# Patient Record
Sex: Female | Born: 1965
Health system: Southern US, Community
[De-identification: ages and names within clinical notes are randomized; demographics above are authoritative.]

## PROBLEM LIST (undated history)

## (undated) DIAGNOSIS — R7303 Prediabetes: Secondary | ICD-10-CM

## (undated) DIAGNOSIS — D649 Anemia, unspecified: Secondary | ICD-10-CM

## (undated) DIAGNOSIS — R011 Cardiac murmur, unspecified: Secondary | ICD-10-CM

## (undated) DIAGNOSIS — F32A Depression, unspecified: Secondary | ICD-10-CM

## (undated) DIAGNOSIS — C801 Malignant (primary) neoplasm, unspecified: Secondary | ICD-10-CM

## (undated) DIAGNOSIS — Z9889 Other specified postprocedural states: Secondary | ICD-10-CM

## (undated) DIAGNOSIS — Z8489 Family history of other specified conditions: Secondary | ICD-10-CM

## (undated) DIAGNOSIS — F329 Major depressive disorder, single episode, unspecified: Secondary | ICD-10-CM

## (undated) DIAGNOSIS — F419 Anxiety disorder, unspecified: Secondary | ICD-10-CM

## (undated) DIAGNOSIS — M1990 Unspecified osteoarthritis, unspecified site: Secondary | ICD-10-CM

## (undated) HISTORY — PX: ABDOMINAL HYSTERECTOMY: SHX81

## (undated) HISTORY — PX: TONSILLECTOMY: SUR1361

---

## 1999-04-23 ENCOUNTER — Other Ambulatory Visit: Admission: RE | Admit: 1999-04-23 | Discharge: 1999-04-23 | Payer: Self-pay | Admitting: *Deleted

## 1999-11-16 ENCOUNTER — Other Ambulatory Visit: Admission: RE | Admit: 1999-11-16 | Discharge: 1999-11-16 | Payer: Self-pay | Admitting: Family Medicine

## 2000-05-20 ENCOUNTER — Other Ambulatory Visit: Admission: RE | Admit: 2000-05-20 | Discharge: 2000-05-20 | Payer: Self-pay | Admitting: Internal Medicine

## 2001-08-11 ENCOUNTER — Other Ambulatory Visit: Admission: RE | Admit: 2001-08-11 | Discharge: 2001-08-11 | Payer: Self-pay | Admitting: *Deleted

## 2001-08-15 ENCOUNTER — Ambulatory Visit (HOSPITAL_COMMUNITY): Admission: RE | Admit: 2001-08-15 | Discharge: 2001-08-15 | Payer: Self-pay | Admitting: Family Medicine

## 2001-08-15 ENCOUNTER — Encounter: Payer: Self-pay | Admitting: Family Medicine

## 2003-01-03 ENCOUNTER — Other Ambulatory Visit: Admission: RE | Admit: 2003-01-03 | Discharge: 2003-01-03 | Payer: Self-pay | Admitting: Family Medicine

## 2004-02-27 ENCOUNTER — Other Ambulatory Visit: Admission: RE | Admit: 2004-02-27 | Discharge: 2004-02-27 | Payer: Self-pay | Admitting: Family Medicine

## 2005-06-22 ENCOUNTER — Other Ambulatory Visit: Admission: RE | Admit: 2005-06-22 | Discharge: 2005-06-22 | Payer: Self-pay | Admitting: Family Medicine

## 2006-09-16 ENCOUNTER — Other Ambulatory Visit: Admission: RE | Admit: 2006-09-16 | Discharge: 2006-09-16 | Payer: Self-pay | Admitting: Family Medicine

## 2008-01-15 ENCOUNTER — Other Ambulatory Visit: Admission: RE | Admit: 2008-01-15 | Discharge: 2008-01-15 | Payer: Self-pay | Admitting: Family Medicine

## 2009-05-19 ENCOUNTER — Other Ambulatory Visit: Admission: RE | Admit: 2009-05-19 | Discharge: 2009-05-19 | Payer: Self-pay | Admitting: Family Medicine

## 2009-07-01 ENCOUNTER — Ambulatory Visit: Payer: Self-pay | Admitting: Gastroenterology

## 2009-07-01 DIAGNOSIS — D649 Anemia, unspecified: Secondary | ICD-10-CM | POA: Insufficient documentation

## 2009-07-02 LAB — CONVERTED CEMR LAB: IgA: 290 mg/dL (ref 68–378)

## 2009-07-08 ENCOUNTER — Encounter (INDEPENDENT_AMBULATORY_CARE_PROVIDER_SITE_OTHER): Payer: Self-pay | Admitting: *Deleted

## 2009-07-08 LAB — CONVERTED CEMR LAB: Tissue Transglutaminase Ab, IgA: 6.4 units (ref <20)

## 2009-07-24 ENCOUNTER — Encounter (INDEPENDENT_AMBULATORY_CARE_PROVIDER_SITE_OTHER): Payer: Self-pay | Admitting: *Deleted

## 2009-07-28 ENCOUNTER — Ambulatory Visit: Payer: Self-pay | Admitting: Gastroenterology

## 2009-08-06 ENCOUNTER — Ambulatory Visit: Payer: Self-pay | Admitting: Gastroenterology

## 2009-10-10 ENCOUNTER — Telehealth (INDEPENDENT_AMBULATORY_CARE_PROVIDER_SITE_OTHER): Payer: Self-pay | Admitting: *Deleted

## 2009-11-11 ENCOUNTER — Emergency Department (HOSPITAL_COMMUNITY)
Admission: EM | Admit: 2009-11-11 | Discharge: 2009-11-11 | Payer: Self-pay | Source: Home / Self Care | Admitting: Family Medicine

## 2010-01-25 ENCOUNTER — Encounter: Payer: Self-pay | Admitting: Physician Assistant

## 2010-02-05 NOTE — Procedures (Signed)
Summary: Colonoscopy  Patient: Patricia Houston Note: All result statuses are Final unless otherwise noted.  Tests: (1) Colonoscopy (COL)   COL Colonoscopy           DONE     [facility]     [address]     [place], [place]  [address]           COLONOSCOPY PROCEDURE REPORT           PATIENT:  Patricia Houston, Patricia Houston  MR#:  [id number]     BIRTHDATE:  09-13-1965, 43 yrs. old  GENDER:  female     ENDOSCOPIST:  Rachael Fee, MD     REF. BY:  Merri Brunette, M.D.     PROCEDURE DATE:  08/06/2009     PROCEDURE:  Diagnostic Colonoscopy     ASA CLASS:  Class II     INDICATIONS:  iron def anemia     MEDICATIONS:   Fentanyl 75 mcg IV, Versed 6 mg IV           DESCRIPTION OF PROCEDURE:   After the risks benefits and     alternatives of the procedure were thoroughly explained, informed     consent was obtained.  Digital rectal exam was performed and     revealed no rectal masses.   The [id number] endoscope was     introduced through the anus and advanced to the terminal ileum     which was intubated for a short distance, without limitations.     The quality of the prep was excellent, using MoviPrep.  The     instrument was then slowly withdrawn as the colon was fully     examined.     <<PROCEDUREIMAGES>>           FINDINGS:  The terminal ileum appeared normal (see image2).  A     normal appearing cecum, ileocecal valve, and appendiceal orifice     were identified. The ascending, hepatic flexure, transverse,     splenic flexure, descending, sigmoid colon, and rectum appeared     unremarkable (see image1, image3, and image4).   Retroflexed views     in the rectum revealed no abnormalities.    The scope was then     withdrawn from the patient and the procedure completed.           COMPLICATIONS:  None     ENDOSCOPIC IMPRESSION:     1) Normal terminal ileum     2) Normal colon; no polyps or cancers     3) No clear cause of IDA           RECOMMENDATIONS:     Will proceed  with EGD now.           ______________________________     Rachael Fee, MD           n.     eSIGNED:   Rachael Fee at 08/06/2009 01:43 PM           [name], [id number]  Note: An exclamation mark (!) indicates a result that was not dispersed into the flowsheet. Document Creation Date: 08/06/2009 1:44 PM _______________________________________________________________________  (1) Order result status: Final Collection or observation date-time: 08/06/2009 13:41 Requested date-time:  Receipt date-time:  Reported date-time:  Referring Physician:   Ordering Physician: Rob Bunting [phone]) Specimen Source:  Source: Launa Grill Order Number: [id number] Lab site:

## 2010-02-05 NOTE — Letter (Signed)
Summary: Previsit letter  [facility]  [address] [place], [place] [address]   Phone: [phone]  Fax: [phone]       07/08/2009 MRN: [id number]  Patricia Houston [address] [place], [place]  [address]  Dear [name],  Welcome to the Gastroenterology Division at [facility].    You are scheduled to see a nurse for your pre-procedure visit on 07/28/09 at 3:30 pm on the 3rd floor at [facility], [address]  We ask that you try to arrive at our office 15 minutes prior to your appointment time to allow for check-in.  Your nurse visit will consist of discussing your medical and surgical history, your immediate family medical history, and your medications.    Please bring a complete list of all your medications or, if you prefer, bring the medication bottles and we will list them.  We will need to be aware of both prescribed and over the counter drugs.  We will need to know exact dosage information as well.  If you are on blood thinners (Coumadin, Plavix, Aggrenox, Ticlid, etc.) please call our office today/prior to your appointment, as we need to consult with your physician about holding your medication.   Please be prepared to read and sign documents such as consent forms, a financial agreement, and acknowledgement forms.  If necessary, and with your consent, a friend or relative is welcome to sit-in on the nurse visit with you.  Please bring your insurance card so that we may make a copy of it.  If your insurance requires a referral to see a specialist, please bring your referral form from your primary care physician.  No co-pay is required for this nurse visit.     If you cannot keep your appointment, please call [phone] to cancel or reschedule prior to your appointment date.  This allows [place] the opportunity to schedule an appointment for another patient in need of care.    Thank you for choosing [facility] for your medical  needs.  We appreciate the opportunity to care for you.  Please visit [place] at our website  to learn more about our practice.                     Sincerely.                                                                                                                   The Gastroenterology Division

## 2010-02-05 NOTE — Procedures (Signed)
Summary: Upper Endoscopy  Patient: [name] Note: All result statuses are Final unless otherwise noted.  Tests: (1) Upper Endoscopy (EGD)   EGD Upper Endoscopy       DONE     [facility]     [address]     [place], [place]  [address]           ENDOSCOPY PROCEDURE REPORT           PATIENT:  Patricia Houston, Patricia Houston  MR#:  [id number]     BIRTHDATE:  Apr 11, 1965, 43 yrs. old  GENDER:  female     ENDOSCOPIST:  Rachael Fee, MD     PROCEDURE DATE:  08/06/2009     PROCEDURE:  EGD with biopsy     ASA CLASS:  Class II     INDICATIONS:  iron def anemia           MEDICATIONS:  There was residual sedation effect present from     prior procedure., Versed 2 mg IV     TOPICAL ANESTHETIC:  none           DESCRIPTION OF PROCEDURE:   After the risks benefits and     alternatives of the procedure were thoroughly explained, informed     consent was obtained.  The LB GIF-H180 [id number] endoscope was     introduced through the mouth and advanced to the second portion of     the duodenum, without limitations.  The instrument was slowly     withdrawn as the mucosa was fully examined.     <<PROCEDUREIMAGES>>           There was mild to moderate pan-gastritis. Distally in stomach     there were 3 linear, spoke-like erosions. Biopsies were taken to     check for H. pylori (see image5 and image6).  Otherwise the     examination was normal (see image7, image4, image3, and image1).     Retroflexed views revealed no abnormalities.    The scope was then     withdrawn from the patient and the procedure completed.           COMPLICATIONS:  None           ENDOSCOPIC IMPRESSION:     1) Mild gastritis; biopsies taken to check for H. pylori     2) Otherwise normal examination           RECOMMENDATIONS:     If biopsies show H. pylori, then she will be started on the     appropriate antibiotics.  If not, then I will have her complete 2     sets of FOBT cards and if there is occult blood in  her stool will     likely set her up with capsule endoscopy to examine small bowel.           ______________________________     Rachael Fee, MD           cc: Merri Brunette, MD           n.     Rosalie Doctor:   Rachael Fee at 08/06/2009 01:53 PM           [name], [id number]  Note: An exclamation mark (!) indicates a result that was not dispersed into the flowsheet. Document Creation Date: 08/06/2009 1:54 PM _______________________________________________________________________  (1) Order result status: Final Collection or observation date-time: 08/06/2009 13:49 Requested date-time:  Receipt date-time:  Reported  date-time:  Referring Physician:   Ordering Physician: Rob Bunting [phone]) Specimen Source:  Source: Launa Grill Order Number: [id number] Lab site:

## 2010-02-05 NOTE — Letter (Signed)
Summary: Moviprep Instructions  [facility]  [address]   [place], [place] [address]   Phone: [phone]  Fax: [phone]       Patricia Houston    Oct 15, 1965    MRN: [id number]        Procedure Day Dorna Bloom: Wednesday, 08-06-09     Arrival Time: 12:30 p.m.     Procedure Time: 1:30 p.m.     Location of Procedure:                    x   [facility] (4th Floor)                        PREPARATION FOR COLONOSCOPY WITH MOVIPREP   Starting 5 days prior to your procedure  08-01-09 do not eat nuts, seeds, popcorn, corn, beans, peas,  salads, or any raw vegetables.  Do not take any fiber supplements (e.g. Metamucil, Citrucel, and Benefiber).  THE DAY BEFORE YOUR PROCEDURE         DATE:  08-05-09  DAY: Tuesday  1.  Drink clear liquids the entire day-NO SOLID FOOD  2.  Do not drink anything colored red or purple.  Avoid juices with pulp.  No orange juice.  3.  Drink at least 64 oz. (8 glasses) of fluid/clear liquids during the day to prevent dehydration and help the prep work efficiently.  CLEAR LIQUIDS INCLUDE: Water Jello Ice Popsicles Tea (sugar ok, no milk/cream) Powdered fruit flavored drinks Coffee (sugar ok, no milk/cream) Gatorade Juice: apple, white grape, white cranberry  Lemonade Clear bullion, consomm, broth Carbonated beverages (any kind) Strained chicken noodle soup Hard Candy                             4.  In the morning, mix first dose of MoviPrep solution:    Empty 1 Pouch A and 1 Pouch B into the disposable container    Add lukewarm drinking water to the top line of the container. Mix to dissolve    Refrigerate (mixed solution should be used within 24 hrs)  5.  Begin drinking the prep at 5:00 p.m. The MoviPrep container is divided by 4 marks.   Every 15 minutes drink the solution down to the next mark (approximately 8 oz) until the full liter is complete.   6.  Follow completed prep with 16 oz of clear liquid of your  choice (Nothing red or purple).  Continue to drink clear liquids until bedtime.  7.  Before going to bed, mix second dose of MoviPrep solution:    Empty 1 Pouch A and 1 Pouch B into the disposable container    Add lukewarm drinking water to the top line of the container. Mix to dissolve    Refrigerate  THE DAY OF YOUR PROCEDURE      DATE: 08-06-09  DAY: Wednesday  Beginning at  8:30 a.m. (5 hours before procedure):         1. Every 15 minutes, drink the solution down to the next mark (approx 8 oz) until the full liter is complete.  2. Follow completed prep with 16 oz. of clear liquid of your choice.    3. You may drink clear liquids until 11:30 a.m. (2 HOURS BEFORE PROCEDURE).   MEDICATION INSTRUCTIONS  Unless otherwise instructed, you should take regular prescription medications with a small sip of water   as early as  possible the morning of your procedure.   Additional medication instructions: Stop Iron 5 days before procedure.         OTHER INSTRUCTIONS  You will need a responsible adult at least 43 years of age to accompany you and drive you home.   This person must remain in the waiting room during your procedure.  Wear loose fitting clothing that is easily removed.  Leave jewelry and other valuables at home.  However, you may wish to bring a book to read or  an iPod/MP3 player to listen to music as you wait for your procedure to start.  Remove all body piercing jewelry and leave at home.  Total time from sign-in until discharge is approximately 2-3 hours.  You should go home directly after your procedure and rest.  You can resume normal activities the  day after your procedure.  The day of your procedure you should not:   Drive   Make legal decisions   Operate machinery   Drink alcohol   Return to work  You will receive specific instructions about eating, activities and medications before you leave.    The above instructions have been reviewed and  explained to me by   Ezra Sites RN  July 28, 2009 3:59 PM     I fully understand and can verbalize these instructions _____________________________ Date _________

## 2010-02-05 NOTE — Assessment & Plan Note (Signed)
History of Present Illness Visit Type: consult  Primary GI MD: Rob Bunting MD Primary Provider: Dario Guardian, MD Requesting Provider: Dario Guardian, MD Chief Complaint: Iron Def Anemia  History of Present Illness:     who was recently found to be anemic.  About 2 years ago she was "borderline anemic."  She did see some minor rectal bleeding about 6 months ago on a couple occasions.   She has "medium" periods lasting about 3 days, has never been concerned about heavy periods.  No real problems with bowels except for minor loose stools.  Very rare NSAIDs.   she had a CBC recently showing her hemoglobin was8.6, MCV 67, RDW 18, I do not have her iron level but she tells me that was quite low.           Current Medications (verified): 1)  Vitamin D (Ergocalciferol) 50000 Unit Caps (Ergocalciferol) .Marland Kitchen.. 1 Cap Orally Twice A Week For 8 Weeks 2)  Hemocyte Plus 106-1 Mg Caps (B Complex-C-Min-Fe-Fa) .Marland Kitchen.. 1 By Mouth Once Daily 3)  Nuvaring 0.12-0.015 Mg/24hr Ring (Etonogestrel-Ethinyl Estradiol) .... As Directed  Allergies (verified): No Known Drug Allergies  Past History:  Past Medical History: Anemia    Past Surgical History: Tonsillectomy    Family History: no colon cancer  Social History: night RN on ortho floor at [employer] nonsmoker, nondrinker, she is married, she has one daughter.  Review of Systems       Pertinent positive and negative review of systems were noted in the above HPI and GI specific review of systems.  All other review of systems was otherwise negative.   Vital Signs:  Patient profile:   45 year old female Height:      66 inches Weight:      154 pounds BMI:     24.95 BSA:     1.79 Pulse rate:   74 / minute Pulse rhythm:   regular BP sitting:   98 / 62  (left arm) Cuff size:   regular  Vitals Entered By: Ok Anis CMA (July 01, 2009 3:00 PM)  Physical Exam  Additional Exam:  Constitutional: generally well appearing Psychiatric:  alert and oriented times 3 Eyes: extraocular movements intact Mouth: oropharynx moist, no lesions Neck: supple, no lymphadenopathy Cardiovascular: heart regular rate and rythm Lungs: CTA bilaterally Abdomen: soft, non-tender, non-distended, no obvious ascites, no peritoneal signs, normal bowel sounds Extremities: no lower extremity edema bilaterally Skin: no lesions on visible extremities    Impression & Recommendations:  Problem # 1:  Iron deficiency anemia we will check her serologically for celiac sprue. Pending those results she will need a colonoscopy for bright red blood per rectum several months ago and likely an upper endoscopy as well. I will call her in a prescription for tandem iron supplements.  Other Orders: TLB-IgA (Immunoglobulin A) (82784-IGA) T-Sprue Panel (Celiac Disease Aby Eval) (83516x3/[address])  Patient Instructions: 1)  You will get lab test(s) done today (celiac sprue panel, total IgA). 2)  You will be scheduled to have a colonoscopy +/- EGD pending the results of the above labs. 3)  Will prescribe iron supplement, available at your pharmacy. 4)  A copy of this information will be sent to your PCP. 5)  The medication list was reviewed and reconciled.  All changed / newly prescribed medications were explained.  A complete medication list was provided to the patient / caregiver. Prescriptions: TANDEM PLUS 162-115.2-1 MG  CAPS (FEFUM-FEPO-FA-B CMP-C-ZN-MN-CU) Take one capsule by mouth daily  #  1 month x 11   Entered and Authorized by:   Rachael Fee MD   Signed by:   Rachael Fee MD on 07/01/2009   Method used:   Electronically to        [facility]  [address] [id number] (retail)       [address]       [place], [place]  [address]       Ph: [phone] or [phone]       Fax: [phone]   RxID:   [id number]

## 2010-02-05 NOTE — Miscellaneous (Signed)
Summary: [facility]  Clinical Lists Changes  Medications: Added new medication of MOVIPREP 100 GM  SOLR (PEG-KCL-NACL-NASULF-NA ASC-C) As per prep instructions. - Signed Rx of MOVIPREP 100 GM  SOLR (PEG-KCL-NACL-NASULF-NA ASC-C) As per prep instructions.;  #1 x 0;  Signed;  Entered by: Ezra Sites RN;  Authorized by: Rachael Fee MD;  Method used: Electronically to [facility]*, [address], [address], [place], [place]  [address], Ph: [phone], Fax: [phone] Observations: Added new observation of NKA: T (07/28/2009 15:36)    Prescriptions: MOVIPREP 100 GM  SOLR (PEG-KCL-NACL-NASULF-NA ASC-C) As per prep instructions.  #1 x 0   Entered by:   Ezra Sites RN   Authorized by:   Rachael Fee MD   Signed by:   Ezra Sites RN on 07/28/2009   Method used:   Electronically to        [facility]* (retail)       [address]       [address]       [place], [place]  [address]       Ph: [phone]       Fax: [phone]   RxID:   [id number]

## 2010-02-05 NOTE — Progress Notes (Signed)
Summary: Lab and ROV reminder  Phone Note Outgoing Call Call back at Home Phone [phone]   Call placed by: Chales Abrahams CMA Duncan Dull),  October 10, 2009 8:48 AM Summary of Call: called and reminded pt to have labs and to schedule an ROV Initial call taken by: Chales Abrahams CMA Duncan Dull),  October 10, 2009 8:48 AM

## 2010-03-17 LAB — POCT PREGNANCY, URINE: Preg Test, Ur: POSITIVE

## 2011-12-23 ENCOUNTER — Other Ambulatory Visit: Payer: Self-pay | Admitting: Obstetrics and Gynecology

## 2011-12-23 ENCOUNTER — Other Ambulatory Visit (HOSPITAL_COMMUNITY)
Admission: RE | Admit: 2011-12-23 | Discharge: 2011-12-23 | Disposition: A | Discharge status: Home / Self Care | Payer: BC Managed Care – PPO | Source: Ambulatory Visit | Attending: Obstetrics and Gynecology | Admitting: Obstetrics and Gynecology

## 2011-12-23 DIAGNOSIS — R6889 Other general symptoms and signs: Secondary | ICD-10-CM | POA: Insufficient documentation

## 2011-12-23 DIAGNOSIS — Z1151 Encounter for screening for human papillomavirus (HPV): Secondary | ICD-10-CM | POA: Insufficient documentation

## 2013-12-13 ENCOUNTER — Other Ambulatory Visit (HOSPITAL_COMMUNITY)
Admission: RE | Admit: 2013-12-13 | Discharge: 2013-12-13 | Disposition: A | Discharge status: Home / Self Care | Payer: BC Managed Care – PPO | Source: Ambulatory Visit | Attending: Obstetrics and Gynecology | Admitting: Obstetrics and Gynecology

## 2013-12-13 ENCOUNTER — Other Ambulatory Visit: Payer: Self-pay | Admitting: Obstetrics and Gynecology

## 2013-12-13 DIAGNOSIS — Z01419 Encounter for gynecological examination (general) (routine) without abnormal findings: Secondary | ICD-10-CM | POA: Diagnosis present

## 2013-12-17 LAB — CYTOLOGY - PAP

## 2014-02-18 ENCOUNTER — Encounter (HOSPITAL_COMMUNITY): Payer: Self-pay

## 2014-02-18 ENCOUNTER — Encounter (HOSPITAL_COMMUNITY)
Admission: RE | Admit: 2014-02-18 | Discharge: 2014-02-18 | Disposition: A | Discharge status: Home / Self Care | Payer: BLUE CROSS/BLUE SHIELD | Source: Ambulatory Visit | Attending: Obstetrics and Gynecology | Admitting: Obstetrics and Gynecology

## 2014-02-18 DIAGNOSIS — N920 Excessive and frequent menstruation with regular cycle: Secondary | ICD-10-CM | POA: Insufficient documentation

## 2014-02-18 DIAGNOSIS — Z01818 Encounter for other preprocedural examination: Secondary | ICD-10-CM | POA: Diagnosis present

## 2014-02-18 DIAGNOSIS — D649 Anemia, unspecified: Secondary | ICD-10-CM | POA: Insufficient documentation

## 2014-02-18 DIAGNOSIS — D259 Leiomyoma of uterus, unspecified: Secondary | ICD-10-CM | POA: Diagnosis not present

## 2014-02-18 HISTORY — DX: Anemia, unspecified: D64.9

## 2014-02-18 LAB — CBC
HCT: 36.3 % (ref 36.0–46.0)
Hemoglobin: 10.7 g/dL — ABNORMAL LOW (ref 12.0–15.0)
MCH: 21.4 pg — ABNORMAL LOW (ref 26.0–34.0)
MCHC: 29.5 g/dL — ABNORMAL LOW (ref 30.0–36.0)
MCV: 72.6 fL — ABNORMAL LOW (ref 78.0–100.0)
Platelets: 260 10*3/uL (ref 150–400)
RBC: 5 MIL/uL (ref 3.87–5.11)
RDW: 19.6 % — ABNORMAL HIGH (ref 11.5–15.5)
WBC: 4.2 10*3/uL (ref 4.0–10.5)

## 2014-02-18 NOTE — Patient Instructions (Addendum)
   Your procedure is scheduled on: FEB 24 [facility]) AY 730AM  Enter through the Main Entrance of [facility] at: [address] up the phone at the desk and dial [phone] and inform us of your arrival.  Please call this number if you have any problems the morning of surgery: [phone]  Remember: Do not eat food or drink clear liquids after midnight: FEB 23  Do not wear jewelry, make-up, or FINGER nail polish No metal in your hair or on your body. Do not wear lotions, powders, perfumes.  You may wear deodorant.  Do not bring valuables to the hospital. Contacts, dentures or bridgework may not be worn into surgery.  Leave suitcase in the car. After Surgery it may be brought to your room. For patients being admitted to the hospital, checkout time is 11:00am the day of discharge.    Patients discharged on the day of surgery will not be allowed to drive home.

## 2014-02-26 ENCOUNTER — Other Ambulatory Visit: Payer: Self-pay | Admitting: Obstetrics and Gynecology

## 2014-02-26 NOTE — H&P (Deleted)
  The note originally documented on this encounter has been moved the the encounter in which it belongs.  

## 2014-02-26 NOTE — H&P (Signed)
Chief Complaint(s):   preop history and physical for 02/27/2014 ( fibroida and abnormal uterine bleeding)   HPI:  General 49 y/o presents for preoperative visit in preparation fof LAVH. bilateral salpingectomy scheduled for 02/27/2014 . she has Anemia. Her last menses was 02/18/2014. She changes every 2-3 hours on her heaviest day has a flooding experience where she saturates her clothing. this started 5 months ago. her menses are regular every 28-30 days. She denies intermenstrual bleeding.  Her ultrasound shows 11.3 cm x 7.6 cm x 8.5 cm uterus with multiple fibroids. the largest is 3.5 cm . she has a posterior submucosal fibroid that is 2.3 cm and appears within the endometrial cavity. Her ovaries appear normal.  Current Medication:  Taking  Multivitamins Capsule as directed     Ferralet 90 90-1 MG Tablet 1 tablet Once a day     Excedrin Migraine(Aspirin-Acetaminophen-Caffeine) 250-250-65 MG Tablet 2 tablets as needed for pain every 6 hrs, Notes: as needed     Medication List reviewed and reconciled with the patient   Medical History:   iron deficiency anemia     gastritis,positive for H. Pylori 8/11   Allergies/Intolerance:   N.K.D.A.   Gyn History:   Sexual activity currently sexually active. Periods : every month. LMP 02/18/2014. Birth control vasectomy in husband. Last pap smear date 12/13/13. Last mammogram date 11/06/13. Denies Abnormal pap smear. Denies STD. Menarche 24.   OB History:   Number of pregnancies 2. Pregnancy # 1 live birth, vaginal delivery, 7 lbs 13 oz. Pregnancy # 2 miscarriage.   Surgical History:   tonsillectomy   Hospitalization:   childbirth x 1   Family History:   Father: alive    Mother: alive, high blood pressure, breast cancer late 41s?, diagnosed with HTN, Breast Ca    Paternal Grand Father: deceased    Paternal Grand Mother: deceased, high blood pressure, diabetes, high cholesterol, diagnosed with DM, HTN    Maternal Grand Father: deceased, prostate  cancer, diagnosed with Prostate Ca    Maternal Grand Mother: deceased, diabetes, diagnosed with DM    Sister 1: high blood pressure   grandfather died of MI,.  Social History:  General Tobacco use cigarettes: Never smoked, Tobacco history last updated 02/18/2014.  Tobacco Exposure: no.  Alcohol: yes, Rare.  Caffeine: yes, coffee, soda, occasionally, tea.  Recreational drug use: no.  Exercise: yes, 3-4 times a week.  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  Marital Status: married.  Children: Kathryne Sharper.  Religion: Christian.  Seat belt use: yes.  originally from [place], [place].  ROS: Negative except as stated in HPI.   Objective:  Vitals:  Wt 162, Wt change -4 lb, Pulse sitting 87, BP sitting 113/70  Past Results:  Examination:  General Examination alert, oriented, NAD" label="GENERAL APPEARANCE" categoryPropId="[id number]" examid="[id number]"GENERAL APPEARANCE alert, oriented, NAD.  clear to auscultation bilaterally" label="LUNGS:" categoryPropId="87" examid="[id number]"LUNGS: clear to auscultation bilaterally.  regular rate and rhythm" label="HEART:" categoryPropId="86" examid="[id number]"HEART: regular rate and rhythm.  soft, non-tender/non-distended, bowel sounds present" label="ABDOMEN:" categoryPropId="88" examid="[id number]"ABDOMEN: soft, non-tender/non-distended, bowel sounds present.  normal external genitalia, labia - unremarkable, vagina - pink moist mucosa, no lesions or abnormal discharge, cervix - no discharge or lesions or CMT, adnexa - no masses or tenderness, uterus - nontender 12 wk size uterus " label="FEMALE GENITOURINARY:" categoryPropId="13414" examid="[id number]"FEMALE GENITOURINARY: normal external genitalia, labia - unremarkable, vagina - pink moist mucosa, no lesions or abnormal discharge, cervix - no discharge or lesions or CMT, adnexa - no masses or tenderness, uterus - nontender  12 wk size uterus .  no clubbing, no edema" label="EXTREMITIES:"  categoryPropId="89" examid="[id number]"EXTREMITIES: no clubbing, no edema.  Physical Examination: Chaperone present McCoy,Tiffany 02/18/2014 02:43:58 PM &gt; , for pelvic exam" label="Chaperone present"Chaperone present McCoy,Tiffany 02/18/2014 02:43:58 PM > , for pelvic exam.    Assessment:  Assessment:  Menorrhagia with regular cycle - N92.0 (Primary)     Submucous leiomyoma of uterus - D25.0     Iron deficiency anemia due to chronic blood loss - D50.0     Plan:  Treatment:  Menorrhagia with regular cycle  Stop Excedrin Migraine(Aspirin-Acetaminophen-Caffeine) Tablet, 250-250-65 MG, 2 tablets as needed for pain, Orally, every 6 hrs, Notes: as needed  Notes: pt desires definitive therapy via LAVH with bilateral salpingectomy she desires to preserve her ovaries as long as they appear normal.. r/b/a of surgery were discussed with the patient including but not limited to infection/ bleeding damage to bowel bladder ureters with the need for further surgery. r/o transfusion HIV/ Hep B&C discussed.. patient advised to discontinue excedrin and avoid antiinflammatories until after surgery.. d/w pt NPO after midnight.  Iron deficiency anemia due to chronic blood loss  Continue Ferralet 90 Tablet, 90-1 MG, 1 tablet, Orally, Once a day

## 2014-02-27 ENCOUNTER — Encounter (HOSPITAL_COMMUNITY)
Admission: RE | Disposition: A | Discharge status: Home / Self Care | Payer: Self-pay | Source: Ambulatory Visit | Attending: Obstetrics and Gynecology

## 2014-02-27 ENCOUNTER — Encounter (HOSPITAL_COMMUNITY): Payer: Self-pay | Admitting: *Deleted

## 2014-02-27 ENCOUNTER — Ambulatory Visit (HOSPITAL_COMMUNITY): Payer: BLUE CROSS/BLUE SHIELD | Admitting: Anesthesiology

## 2014-02-27 ENCOUNTER — Observation Stay (HOSPITAL_COMMUNITY)
Admission: RE | Admit: 2014-02-27 | Discharge: 2014-02-28 | Disposition: A | Discharge status: Home / Self Care | Payer: BLUE CROSS/BLUE SHIELD | Source: Ambulatory Visit | Attending: Obstetrics and Gynecology | Admitting: Obstetrics and Gynecology

## 2014-02-27 DIAGNOSIS — N920 Excessive and frequent menstruation with regular cycle: Secondary | ICD-10-CM | POA: Diagnosis not present

## 2014-02-27 DIAGNOSIS — D259 Leiomyoma of uterus, unspecified: Secondary | ICD-10-CM | POA: Diagnosis present

## 2014-02-27 DIAGNOSIS — Z90710 Acquired absence of both cervix and uterus: Secondary | ICD-10-CM | POA: Diagnosis present

## 2014-02-27 DIAGNOSIS — D500 Iron deficiency anemia secondary to blood loss (chronic): Secondary | ICD-10-CM | POA: Insufficient documentation

## 2014-02-27 HISTORY — PX: LAPAROSCOPIC ASSISTED VAGINAL HYSTERECTOMY: SHX5398

## 2014-02-27 LAB — CBC
HCT: 29.6 % — ABNORMAL LOW (ref 36.0–46.0)
Hemoglobin: 8.8 g/dL — ABNORMAL LOW (ref 12.0–15.0)
MCH: 21.8 pg — ABNORMAL LOW (ref 26.0–34.0)
MCHC: 29.7 g/dL — ABNORMAL LOW (ref 30.0–36.0)
MCV: 73.4 fL — ABNORMAL LOW (ref 78.0–100.0)
Platelets: 281 10*3/uL (ref 150–400)
RBC: 4.03 MIL/uL (ref 3.87–5.11)
RDW: 20.1 % — ABNORMAL HIGH (ref 11.5–15.5)
WBC: 5.7 10*3/uL (ref 4.0–10.5)

## 2014-02-27 LAB — PREGNANCY, URINE: Preg Test, Ur: NEGATIVE

## 2014-02-27 LAB — ABO/RH: ABO/RH(D): B POS

## 2014-02-27 LAB — PREPARE RBC (CROSSMATCH)

## 2014-02-27 SURGERY — HYSTERECTOMY, VAGINAL, LAPAROSCOPY-ASSISTED
Anesthesia: General | Site: Abdomen

## 2014-02-27 MED ORDER — PROPOFOL 10 MG/ML IV BOLUS
INTRAVENOUS | Status: AC
  Filled 2014-02-27: qty 40

## 2014-02-27 MED ORDER — IBUPROFEN 800 MG PO TABS
800.0000 mg | ORAL_TABLET | Freq: Three times a day (TID) | ORAL | Status: DC | PRN
  Administered 2014-02-28 (×2): 800 mg via ORAL
  Filled 2014-02-27 (×2): qty 1

## 2014-02-27 MED ORDER — ONDANSETRON HCL 4 MG/2ML IJ SOLN
4.0000 mg | Freq: Four times a day (QID) | INTRAMUSCULAR | Status: DC | PRN
  Administered 2014-02-27: 4 mg via INTRAVENOUS
  Filled 2014-02-27: qty 2

## 2014-02-27 MED ORDER — LIDOCAINE HCL (CARDIAC) 20 MG/ML IV SOLN
INTRAVENOUS | Status: AC
  Filled 2014-02-27: qty 5

## 2014-02-27 MED ORDER — PROPOFOL 10 MG/ML IV BOLUS
INTRAVENOUS | Status: DC | PRN
  Administered 2014-02-27: 170 mg via INTRAVENOUS

## 2014-02-27 MED ORDER — ALUM & MAG HYDROXIDE-SIMETH 200-200-20 MG/5ML PO SUSP: 30.0000 mL | ORAL | Status: DC | PRN

## 2014-02-27 MED ORDER — DEXAMETHASONE SODIUM PHOSPHATE 10 MG/ML IJ SOLN
INTRAMUSCULAR | Status: AC
  Filled 2014-02-27: qty 1

## 2014-02-27 MED ORDER — FENTANYL CITRATE 0.05 MG/ML IJ SOLN
INTRAMUSCULAR | Status: DC | PRN
  Administered 2014-02-27: 100 ug via INTRAVENOUS
  Administered 2014-02-27: 50 ug via INTRAVENOUS

## 2014-02-27 MED ORDER — ONDANSETRON HCL 4 MG/2ML IJ SOLN
INTRAMUSCULAR | Status: DC | PRN
  Administered 2014-02-27: 4 mg via INTRAVENOUS

## 2014-02-27 MED ORDER — KETOROLAC TROMETHAMINE 30 MG/ML IJ SOLN: 30.0000 mg | Freq: Four times a day (QID) | INTRAMUSCULAR | Status: DC

## 2014-02-27 MED ORDER — GLYCOPYRROLATE 0.2 MG/ML IJ SOLN
INTRAMUSCULAR | Status: AC
  Filled 2014-02-27: qty 3

## 2014-02-27 MED ORDER — HYDROMORPHONE HCL 1 MG/ML IJ SOLN
0.2000 mg | INTRAMUSCULAR | Status: DC | PRN
  Administered 2014-02-27: 0.6 mg via INTRAVENOUS
  Filled 2014-02-27: qty 1

## 2014-02-27 MED ORDER — HYDROMORPHONE HCL 1 MG/ML IJ SOLN
0.2500 mg | INTRAMUSCULAR | Status: DC | PRN
  Administered 2014-02-27: 0.5 mg via INTRAVENOUS
  Administered 2014-02-27: 0.25 mg via INTRAVENOUS

## 2014-02-27 MED ORDER — LACTATED RINGERS IV SOLN
INTRAVENOUS | Status: DC
  Administered 2014-02-27 (×3): via INTRAVENOUS

## 2014-02-27 MED ORDER — CEFAZOLIN SODIUM-DEXTROSE 2-3 GM-% IV SOLR: 2.0000 g | INTRAVENOUS | Status: DC

## 2014-02-27 MED ORDER — LACTATED RINGERS IV SOLN
INTRAVENOUS | Status: DC
  Administered 2014-02-27 – 2014-02-28 (×3): via INTRAVENOUS

## 2014-02-27 MED ORDER — PHENYLEPHRINE HCL 10 MG/ML IJ SOLN
INTRAMUSCULAR | Status: DC | PRN
  Administered 2014-02-27: 80 ug via INTRAVENOUS
  Administered 2014-02-27 (×2): 40 ug via INTRAVENOUS
  Administered 2014-02-27 (×2): 80 ug via INTRAVENOUS

## 2014-02-27 MED ORDER — ROCURONIUM BROMIDE 100 MG/10ML IV SOLN
INTRAVENOUS | Status: AC
  Filled 2014-02-27: qty 1

## 2014-02-27 MED ORDER — DEXAMETHASONE SODIUM PHOSPHATE 10 MG/ML IJ SOLN
INTRAMUSCULAR | Status: DC | PRN
  Administered 2014-02-27: 10 mg via INTRAVENOUS

## 2014-02-27 MED ORDER — MIDAZOLAM HCL 2 MG/2ML IJ SOLN
INTRAMUSCULAR | Status: DC | PRN
  Administered 2014-02-27: 2 mg via INTRAVENOUS

## 2014-02-27 MED ORDER — GLYCOPYRROLATE 0.2 MG/ML IJ SOLN
INTRAMUSCULAR | Status: DC | PRN
  Administered 2014-02-27: 0.6 mg via INTRAVENOUS

## 2014-02-27 MED ORDER — MIDAZOLAM HCL 2 MG/2ML IJ SOLN
INTRAMUSCULAR | Status: AC
  Filled 2014-02-27: qty 2

## 2014-02-27 MED ORDER — NEOSTIGMINE METHYLSULFATE 10 MG/10ML IV SOLN
INTRAVENOUS | Status: AC
  Filled 2014-02-27: qty 1

## 2014-02-27 MED ORDER — PHENYLEPHRINE 40 MCG/ML (10ML) SYRINGE FOR IV PUSH (FOR BLOOD PRESSURE SUPPORT)
PREFILLED_SYRINGE | INTRAVENOUS | Status: AC
  Filled 2014-02-27: qty 20

## 2014-02-27 MED ORDER — ONDANSETRON HCL 4 MG PO TABS: 4.0000 mg | ORAL_TABLET | Freq: Four times a day (QID) | ORAL | Status: DC | PRN

## 2014-02-27 MED ORDER — SCOPOLAMINE 1 MG/3DAYS TD PT72
1.0000 | MEDICATED_PATCH | Freq: Once | TRANSDERMAL | Status: DC
  Administered 2014-02-27: 1.5 mg via TRANSDERMAL

## 2014-02-27 MED ORDER — CEFAZOLIN SODIUM-DEXTROSE 2-3 GM-% IV SOLR
INTRAVENOUS | Status: AC
  Administered 2014-02-27: 2 g via INTRAVENOUS
  Filled 2014-02-27: qty 50

## 2014-02-27 MED ORDER — LIDOCAINE HCL (CARDIAC) 20 MG/ML IV SOLN
INTRAVENOUS | Status: DC | PRN
  Administered 2014-02-27: 80 mg via INTRAVENOUS

## 2014-02-27 MED ORDER — KETOROLAC TROMETHAMINE 30 MG/ML IJ SOLN
30.0000 mg | Freq: Four times a day (QID) | INTRAMUSCULAR | Status: DC
  Administered 2014-02-27: 30 mg via INTRAVENOUS
  Filled 2014-02-27: qty 1

## 2014-02-27 MED ORDER — SODIUM CHLORIDE 0.9 % IJ SOLN
INTRAMUSCULAR | Status: AC
  Filled 2014-02-27: qty 10

## 2014-02-27 MED ORDER — ROPIVACAINE HCL 5 MG/ML IJ SOLN
INTRAMUSCULAR | Status: AC
  Filled 2014-02-27: qty 60

## 2014-02-27 MED ORDER — LACTATED RINGERS IR SOLN
Status: DC | PRN
  Administered 2014-02-27: 3000 mL

## 2014-02-27 MED ORDER — NEOSTIGMINE METHYLSULFATE 10 MG/10ML IV SOLN
INTRAVENOUS | Status: DC | PRN
  Administered 2014-02-27: 3 mg via INTRAVENOUS

## 2014-02-27 MED ORDER — SCOPOLAMINE 1 MG/3DAYS TD PT72
MEDICATED_PATCH | TRANSDERMAL | Status: AC
  Administered 2014-02-27: 1.5 mg via TRANSDERMAL
  Filled 2014-02-27: qty 1

## 2014-02-27 MED ORDER — ONDANSETRON HCL 4 MG/2ML IJ SOLN
INTRAMUSCULAR | Status: AC
  Filled 2014-02-27: qty 2

## 2014-02-27 MED ORDER — METOCLOPRAMIDE HCL 5 MG/ML IJ SOLN
10.0000 mg | Freq: Once | INTRAMUSCULAR | Status: AC | PRN
  Administered 2014-02-27: 10 mg via INTRAVENOUS

## 2014-02-27 MED ORDER — VASOPRESSIN 20 UNIT/ML IV SOLN
INTRAVENOUS | Status: AC
  Filled 2014-02-27: qty 1

## 2014-02-27 MED ORDER — VASOPRESSIN 20 UNIT/ML IV SOLN
INTRAVENOUS | Status: DC | PRN
  Administered 2014-02-27: 40 mL via INTRAMUSCULAR

## 2014-02-27 MED ORDER — FENTANYL CITRATE 0.05 MG/ML IJ SOLN
INTRAMUSCULAR | Status: AC
  Filled 2014-02-27: qty 5

## 2014-02-27 MED ORDER — SODIUM CHLORIDE 0.9 % IV SOLN
INTRAVENOUS | Status: DC | PRN
  Administered 2014-02-27: 85 mL

## 2014-02-27 MED ORDER — KETOROLAC TROMETHAMINE 30 MG/ML IJ SOLN
INTRAMUSCULAR | Status: AC
  Filled 2014-02-27: qty 1

## 2014-02-27 MED ORDER — BUPIVACAINE HCL (PF) 0.25 % IJ SOLN
INTRAMUSCULAR | Status: AC
  Filled 2014-02-27: qty 30

## 2014-02-27 MED ORDER — SODIUM CHLORIDE 0.9 % IV SOLN: Freq: Once | INTRAVENOUS | Status: DC

## 2014-02-27 MED ORDER — OXYCODONE-ACETAMINOPHEN 5-325 MG PO TABS: 1.0000 | ORAL_TABLET | ORAL | Status: DC | PRN

## 2014-02-27 MED ORDER — SENNA 8.6 MG PO TABS
1.0000 | ORAL_TABLET | Freq: Two times a day (BID) | ORAL | Status: DC
  Administered 2014-02-28: 8.6 mg via ORAL
  Filled 2014-02-27 (×5): qty 1

## 2014-02-27 MED ORDER — METOCLOPRAMIDE HCL 5 MG/ML IJ SOLN
INTRAMUSCULAR | Status: AC
  Filled 2014-02-27: qty 2

## 2014-02-27 MED ORDER — MEPERIDINE HCL 25 MG/ML IJ SOLN: 6.2500 mg | INTRAMUSCULAR | Status: DC | PRN

## 2014-02-27 MED ORDER — HYDROMORPHONE HCL 1 MG/ML IJ SOLN
INTRAMUSCULAR | Status: AC
  Administered 2014-02-27: 0.25 mg via INTRAVENOUS
  Filled 2014-02-27: qty 1

## 2014-02-27 MED ORDER — SODIUM CHLORIDE 0.9 % IJ SOLN
INTRAMUSCULAR | Status: AC
  Filled 2014-02-27: qty 50

## 2014-02-27 MED ORDER — ROCURONIUM BROMIDE 100 MG/10ML IV SOLN
INTRAVENOUS | Status: DC | PRN
  Administered 2014-02-27: 30 mg via INTRAVENOUS

## 2014-02-27 MED ORDER — KETOROLAC TROMETHAMINE 30 MG/ML IJ SOLN
INTRAMUSCULAR | Status: DC | PRN
  Administered 2014-02-27: 30 mg via INTRAVENOUS

## 2014-02-27 MED ORDER — SODIUM CHLORIDE 0.9 % IJ SOLN
INTRAMUSCULAR | Status: AC
  Filled 2014-02-27: qty 100

## 2014-02-27 SURGICAL SUPPLY — 50 items
APPLICATOR COTTON TIP 6IN STRL (MISCELLANEOUS) ×3 IMPLANT
CATH ROBINSON RED A/P 16FR (CATHETERS) ×2 IMPLANT
CLOTH BEACON ORANGE TIMEOUT ST (SAFETY) ×3 IMPLANT
CONT PATH 16OZ SNAP LID 3702 (MISCELLANEOUS) ×3 IMPLANT
COVER BACK TABLE 60X90IN (DRAPES) ×3 IMPLANT
DECANTER SPIKE VIAL GLASS SM (MISCELLANEOUS) ×14 IMPLANT
DRAPE STERI URO 9X17 APER PCH (DRAPES) ×3 IMPLANT
DRSG COVADERM PLUS 2X2 (GAUZE/BANDAGES/DRESSINGS) ×6 IMPLANT
DRSG OPSITE POSTOP 3X4 (GAUZE/BANDAGES/DRESSINGS) ×2 IMPLANT
DURAPREP 26ML APPLICATOR (WOUND CARE) ×3 IMPLANT
ELECT LIGASURE SHORT 9 REUSE (ELECTRODE) ×2 IMPLANT
ELECT REM PT RETURN 9FT ADLT (ELECTROSURGICAL) ×3
ELECTRODE REM PT RTRN 9FT ADLT (ELECTROSURGICAL) ×1 IMPLANT
GLOVE BIO SURGEON STRL SZ7 (GLOVE) ×8 IMPLANT
GLOVE BIOGEL M 6.5 STRL (GLOVE) ×18 IMPLANT
GLOVE BIOGEL PI IND STRL 6.5 (GLOVE) ×4 IMPLANT
GLOVE BIOGEL PI IND STRL 7.0 (GLOVE) ×9 IMPLANT
GLOVE BIOGEL PI INDICATOR 6.5 (GLOVE) ×2
GLOVE BIOGEL PI INDICATOR 7.0 (GLOVE) ×7
GLOVE SURG SS PI 7.0 STRL IVOR (GLOVE) ×16 IMPLANT
IV LACTATED RINGER IRRG 3000ML (IV SOLUTION) ×3
IV LR IRRIG 3000ML ARTHROMATIC (IV SOLUTION) ×1 IMPLANT
LEGGING LITHOTOMY PAIR STRL (DRAPES) ×2 IMPLANT
LIQUID BAND (GAUZE/BANDAGES/DRESSINGS) ×3 IMPLANT
NDL SPNL 22GX3.5 QUINCKE BK (NEEDLE) ×1 IMPLANT
NEEDLE SPNL 22GX3.5 QUINCKE BK (NEEDLE) ×3 IMPLANT
NS IRRIG 1000ML POUR BTL (IV SOLUTION) ×3 IMPLANT
PACK LAVH (CUSTOM PROCEDURE TRAY) ×3 IMPLANT
PACK ROBOTIC GOWN (GOWN DISPOSABLE) ×3 IMPLANT
PAD POSITIONER PINK NONSTERILE (MISCELLANEOUS) ×3 IMPLANT
PAD POSITIONING PINK XL (MISCELLANEOUS) ×2 IMPLANT
PROTECTOR NERVE ULNAR (MISCELLANEOUS) ×6 IMPLANT
SEALER TISSUE G2 CVD JAW 35 (ENDOMECHANICALS) ×1 IMPLANT
SEALER TISSUE G2 CVD JAW 45CM (ENDOMECHANICALS) ×1
SET IRRIG TUBING LAPAROSCOPIC (IRRIGATION / IRRIGATOR) ×2 IMPLANT
SHEARS HARMONIC ACE PLUS 36CM (ENDOMECHANICALS) ×2 IMPLANT
SOLUTION ELECTROLUBE (MISCELLANEOUS) ×2 IMPLANT
SUT VIC AB 0 CT1 27 (SUTURE) ×6
SUT VIC AB 0 CT1 27XCR 8 STRN (SUTURE) ×4 IMPLANT
SUT VIC AB 0 CT1 36 (SUTURE) ×6 IMPLANT
SUT VIC AB 0 CTXB 36 (SUTURE) IMPLANT
SUT VICRYL 1 TIES 12X18 (SUTURE) ×3 IMPLANT
SUT VICRYL 4-0 PS2 18IN ABS (SUTURE) IMPLANT
SYR CONTROL 10ML LL (SYRINGE) ×3 IMPLANT
TOWEL OR 17X24 6PK STRL BLUE (TOWEL DISPOSABLE) ×6 IMPLANT
TRAY FOLEY CATH 14FR (SET/KITS/TRAYS/PACK) ×3 IMPLANT
TROCAR XCEL NON-BLD 11X100MML (ENDOMECHANICALS) IMPLANT
TROCAR XCEL NON-BLD 5MMX100MML (ENDOMECHANICALS) ×9 IMPLANT
WARMER LAPAROSCOPE (MISCELLANEOUS) ×3 IMPLANT
WATER STERILE IRR 1000ML POUR (IV SOLUTION) ×3 IMPLANT

## 2014-02-27 NOTE — Op Note (Addendum)
02/27/2014  7:54 PM  PATIENT:  Patricia Houston  49 y.o. female  PRE-OPERATIVE DIAGNOSIS:   Fibroids Menorrhagia  Anemia  POST-OPERATIVE DIAGNOSIS:   Fibroids Menorrhagia  Anemia  PROCEDURE:  Procedure(s) with comments: LAPAROSCOPIC ASSISTED VAGINAL HYSTERECTOMY (N/A) - abdomen to vagina to abdomen  Left salpingectomy. Partial right salpingectomy   SURGEON:  Surgeon(s) and Role:    * Carless Slatten J. Landry Mellow, MD - Primary    * Thurnell Lose, MD - Assisting ( Dr. Simona Huh was needed to assist due to the complexity of the surgery)  PHYSICIAN ASSISTANT: None   ASSISTANTS: none   ANESTHESIA:   general  EBL: 200 CC    BLOOD ADMINISTERED:none  DRAINS: Urinary Catheter (Foley)   LOCAL MEDICATIONS USED:  OTHER Ropivacaine  SPECIMEN:  Source of Specimen:  uterus cervix  Left fallopian tube portion of the right fallopian tube   DISPOSITION OF SPECIMEN:  PATHOLOGY  COUNTS:  YES  TOURNIQUET:  * No tourniquets in log *  DICTATION: .Dragon Dictation  PLAN OF CARE: Admit for overnight observation  PATIENT DISPOSITION:  PACU - hemodynamically stable.   Delay start of Pharmacological VTE agent (>24 hrs) due to surgical blood loss or risk of bleeding: not applicable   Procedure: the patient was taken to the operating room placed under general anesthesia. Prepped and draped in the normal sterile fashion. A foley catheter was placed. A uterine manipulator was placed. Attention was turned to the abdomen where the umbilicus was injected with 10 cc of  Ropivacaine. A 5 mm trocar was placed under direct visualization. Pneumoperitoneum was achieved with C02 gas... A 5 mm trocar was placed in the right and left lower quadrants. Each trocar site was injected with 10 cc of ropivacaine prior to trocar placement.  . The left ureter was identified. The left mesosalpinx and uteroovarian ligament was cauterized and transected with the harmonic scalpel. Followed by the broad ligament and the round ligament.  This was repeated on the right side.  The right fallopian tube was adherent to ovary not allowing complete dissection of the fallopian tube without compromising the ovary or hemostasis. The anterior leaf of the broad ligament was transected using the harmonic scalpel.   Attention was then turned to the vagina. A weighted speculum was placed in to the vagina and the cervix was grasped with a toothed tenaculum. The cervix was then injected circumferentially with vasopressin. The cervix was then circumferentially incised with the  Bovie and the bladder was dissected off the pubovesical cervical fascia. The anterior cul-de-sac as entered sharply. The same procedure was performed posteriorly and the posterior cu-lde-sac was entered sharply without difficulty. A Heaney clamp was placed over the uterosacral ligaments bilaterally., These were transected and suture ligated with 0 vicryl. The cardinal ligaments were then grasped with the ligasure cauterized and transected.   The uterine arteries Then clamped with ligasure cauterized and , transected .Excellent hemostasis was visualized. The uterus cervix and  bilateral fallopian tubes were then removed.  The vaginal cuff angles were closed with an angle suture of 0 vicryl and transfixed to the ipsilateral uterosacral ligaments. The remainder of the vaginal cuff was closed with 0 vicryl in a running locked fashion. All instruments were removed from the vagina.  Attention was turned to the abdomen. The pneumoperitoneum was reestablished. The pelvis was irrigated. Pt was noted to have bleeding along the right and left side wall . Hemostasis was achieved with Kleppinger.  Excellent hemostasis was noted. All trocars were removed under  direct visualization . The pneumoperitoneum was released. The skin incisions were closed with 4-0 vicryl and derma bond.  the patient was taken to the recovery room awake and in stable condition.  Sponge lap and needle counts were correct  times 2.     Finding: multiple fibroid uterus. Fallopian tube appeared clubbed bilaterally. Ovaries appeared normal

## 2014-02-27 NOTE — H&P (Signed)
Date of Initial H&P: 02/26/2014 History reviewed, patient examined, no change in status, stable for surgery.

## 2014-02-27 NOTE — Anesthesia Preprocedure Evaluation (Signed)
Anesthesia Evaluation  Patient identified by MRN, date of birth, ID band Patient awake    Reviewed: Allergy & Precautions, NPO status , Patient's Chart, lab work & pertinent test results  History of Anesthesia Complications Negative for: history of anesthetic complications  Airway Mallampati: II  TM Distance: >3 FB Neck ROM: Full    Dental no notable dental hx. (+) Dental Advisory Given   Pulmonary neg pulmonary ROS,  breath sounds clear to auscultation  Pulmonary exam normal       Cardiovascular negative cardio ROS  Rhythm:Regular Rate:Normal     Neuro/Psych negative neurological ROS  negative psych ROS   GI/Hepatic Neg liver ROS, GERD-  ,  Endo/Other  negative endocrine ROSobesity  Renal/GU negative Renal ROS  negative genitourinary   Musculoskeletal negative musculoskeletal ROS (+)   Abdominal   Peds negative pediatric ROS (+)  Hematology  (+) anemia ,   Anesthesia Other Findings   Reproductive/Obstetrics negative OB ROS                             Anesthesia Physical Anesthesia Plan  ASA: II  Anesthesia Plan: General   Post-op Pain Management:    Induction: Intravenous  Airway Management Planned: Oral ETT  Additional Equipment:   Intra-op Plan:   Post-operative Plan: Extubation in OR  Informed Consent: I have reviewed the patients History and Physical, chart, labs and discussed the procedure including the risks, benefits and alternatives for the proposed anesthesia with the patient or authorized representative who has indicated his/her understanding and acceptance.   Dental advisory given  Plan Discussed with: CRNA  Anesthesia Plan Comments:         Anesthesia Quick Evaluation

## 2014-02-27 NOTE — Anesthesia Postprocedure Evaluation (Signed)
  Anesthesia Post-op Note  Patient: Patricia Houston  Procedure(s) Performed: Procedure(s) with comments: LAPAROSCOPIC ASSISTED VAGINAL HYSTERECTOMY (N/A) - abdomen to vagina to abdomen  Patient Location: PACU  Anesthesia Type:General  Level of Consciousness: awake, alert  and oriented  Airway and Oxygen Therapy: Patient Spontanous Breathing  Post-op Pain: mild  Post-op Assessment: Post-op Vital signs reviewed, Patient's Cardiovascular Status Stable, Respiratory Function Stable, Patent Airway, No signs of Nausea or vomiting and Pain level controlled  Post-op Vital Signs: Reviewed and stable  Last Vitals:  Filed Vitals:   02/27/14 1130  BP: 111/69  Pulse: 73  Temp: 36.6 C  Resp: 16    Complications: No apparent anesthesia complications

## 2014-02-27 NOTE — Transfer of Care (Signed)
Immediate Anesthesia Transfer of Care Note  Patient: Patricia Houston  Procedure(s) Performed: Procedure(s) with comments: LAPAROSCOPIC ASSISTED VAGINAL HYSTERECTOMY (N/A) - abdomen to vagina to abdomen  Patient Location: PACU  Anesthesia Type:General  Level of Consciousness: awake and sedated  Airway & Oxygen Therapy: Patient connected to nasal cannula oxygen  Post-op Assessment: Report given to RN, Post -op Vital signs reviewed and stable and Patient moving all extremities  Post vital signs: Reviewed and stable  Last Vitals:  Filed Vitals:   02/27/14 0608  BP: 123/77  Pulse: 85  Temp: 36.7 C  Resp: 20    Complications: No apparent anesthesia complications

## 2014-02-27 NOTE — Anesthesia Postprocedure Evaluation (Signed)
Anesthesia Post Note  Patient: Patricia Houston  Procedure(s) Performed: Procedure(s) (LRB): LAPAROSCOPIC ASSISTED VAGINAL HYSTERECTOMY (N/A)  Anesthesia type: General  Patient location: [facility] Unit  Post pain: Pain level controlled  Post assessment: Post-op Vital signs reviewed  Last Vitals:  Filed Vitals:   02/27/14 1300  BP: 118/77  Pulse: 64  Temp: 36.8 C  Resp: 16    Post vital signs: Reviewed  Level of consciousness: sedated  Complications: No apparent anesthesia complications

## 2014-02-27 NOTE — Addendum Note (Signed)
Addendum  created 02/27/14 1610 by Jonna Munro, CRNA   Modules edited: Notes Section   Notes Section:  File: [id number]

## 2014-02-27 NOTE — Anesthesia Procedure Notes (Signed)
Procedure Name: Intubation Date/Time: 02/27/2014 7:23 AM Performed by: France Ravens HRISTOVA Pre-anesthesia Checklist: Patient identified, Emergency Drugs available, Suction available, Patient being monitored and Timeout performed Patient Re-evaluated:Patient Re-evaluated prior to inductionOxygen Delivery Method: Circle system utilized Preoxygenation: Pre-oxygenation with 100% oxygen Intubation Type: IV induction Ventilation: Mask ventilation without difficulty Laryngoscope Size: Mac and 3 Grade View: Grade II Tube type: Oral Tube size: 7.0 mm Number of attempts: 1 Airway Equipment and Method: Stylet Placement Confirmation: ETT inserted through vocal cords under direct vision,  positive ETCO2 and breath sounds checked- equal and bilateral Secured at: 21 cm Tube secured with: Tape Dental Injury: Teeth and Oropharynx as per pre-operative assessment

## 2014-02-28 ENCOUNTER — Encounter (HOSPITAL_COMMUNITY): Payer: Self-pay | Admitting: Obstetrics and Gynecology

## 2014-02-28 DIAGNOSIS — D259 Leiomyoma of uterus, unspecified: Secondary | ICD-10-CM | POA: Diagnosis not present

## 2014-02-28 LAB — CBC
HCT: 23.3 % — ABNORMAL LOW (ref 36.0–46.0)
Hemoglobin: 7.1 g/dL — ABNORMAL LOW (ref 12.0–15.0)
MCH: 22.2 pg — ABNORMAL LOW (ref 26.0–34.0)
MCHC: 30.5 g/dL (ref 30.0–36.0)
MCV: 72.8 fL — ABNORMAL LOW (ref 78.0–100.0)
Platelets: 238 10*3/uL (ref 150–400)
RBC: 3.2 MIL/uL — ABNORMAL LOW (ref 3.87–5.11)
RDW: 20.1 % — ABNORMAL HIGH (ref 11.5–15.5)
WBC: 8.3 10*3/uL (ref 4.0–10.5)

## 2014-02-28 MED ORDER — BISACODYL 10 MG RE SUPP: 10.0000 mg | Freq: Once | RECTAL | Status: DC

## 2014-02-28 MED ORDER — IBUPROFEN 800 MG PO TABS: 800.0000 mg | ORAL_TABLET | Freq: Three times a day (TID) | ORAL | Status: DC | PRN

## 2014-02-28 MED ORDER — OXYCODONE-ACETAMINOPHEN 5-325 MG PO TABS: 1.0000 | ORAL_TABLET | ORAL | Status: DC | PRN

## 2014-02-28 NOTE — Progress Notes (Signed)
Ur chart review completed.  

## 2014-02-28 NOTE — Progress Notes (Signed)
Pt is discharged in the care of husband with R.N. Escort. Denies any pain or discomfort. Spirits are good.  Denies any pain or discomfort. Understands all  Discharged instructions. Questions asked and answered. No equioment needed for home use.

## 2014-02-28 NOTE — Progress Notes (Signed)
1 Day Post-Op Procedure(s) (LRB): LAPAROSCOPIC ASSISTED VAGINAL HYSTERECTOMY (N/A)  Subjective: Patient reports tolerating PO.   Foley still in place.. Pain is well controlled. No flatus yet.  Objective: I have reviewed patient's vital signs and labs.  General: alert and cooperative GI: soft appropriately tender nondistended +BS bandages clean dry and intact  Extremities: extremities normal, atraumatic, no cyanosis or edema  Assessment: s/p Procedure(s) with comments: LAPAROSCOPIC ASSISTED VAGINAL HYSTERECTOMY (N/A) - abdomen to vagina to abdomen: stable, progressing well and tolerating diet  Plan: Encourage ambulation Advance to PO medication Discontinue IV fluids remove foley...   one pt voids and has flatus d/c home.  Plan dulcolax suppository after lunch if no flatus by then  Follow up in the office in 2 wks  For post operative visit      Patricia Houston J. 02/28/2014, 6:56 AM

## 2014-03-02 LAB — TYPE AND SCREEN
ABO/RH(D): B POS
Antibody Screen: NEGATIVE
Unit division: 0
Unit division: 0
Unit division: 0
Unit division: 0

## 2014-03-18 NOTE — Discharge Summary (Signed)
Physician Discharge Summary  Patient ID: Patricia Houston MRN: [id number] DOB/AGE: 09-11-1965 49 y.o.  Admit date: 02/27/2014 Discharge date: 03/18/2014  Admission Diagnoses: Fibroids and abnormal uterine bleeding   Discharge Diagnoses: Anemia S/p Hysterectomy   Discharged Condition: good  Hospital Course:  Pt was admitted for observation after LAVH with bilateral salpingectomy on 02/27/2014. She did well postoperatively. She had preop hgb of 8.8 . On pod #1 it was 7.1.. Pt was asymptomatic. She is sent home on POD #1 with normal bowel and bladder function  Consults: None  Significant Diagnostic Studies: labs: hgb .7.1  Treatments: surgery: Laporscopic assisted vaginal hysterectomy with bilateral salpingectomy.   Discharge Exam: Blood pressure 115/65, pulse 104, temperature 98.9 F (37.2 C), temperature source Oral, resp. rate 18, height 5\' 6"  (1.676 m), weight 73.029 kg (161 lb), SpO2 97 %. General appearance: alert and cooperative GI: soft appropriately tender nondistended  Extremities: extremities normal, atraumatic, no cyanosis or edema  Disposition: 01-Home or Self Care  Discharge Instructions    Call MD for:  persistant nausea and vomiting    Complete by:  As directed      Call MD for:  redness, tenderness, or signs of infection (pain, swelling, redness, odor or green/yellow discharge around incision site)    Complete by:  As directed      Call MD for:  severe uncontrolled pain    Complete by:  As directed      Call MD for:  temperature >100.4    Complete by:  As directed      Diet - low sodium heart healthy    Complete by:  As directed      Driving Restrictions    Complete by:  As directed   Avoid driving for 2 weeks     Increase activity slowly    Complete by:  As directed      Lifting restrictions    Complete by:  As directed   Avoid lifting over 10 lbs     Nursing communication    Complete by:  As directed   Hold discharge until patient voids and has  flatus .Marland Kitchen If no flatus by 1200 give dulcolax suppository 10 mg Per rectum x 1     Remove dressing in 48 hours    Complete by:  As directed      Sexual Activity Restrictions    Complete by:  As directed   Avoid sex for 6-8 weeks until approved by Dr. Landry Mellow            Medication List    TAKE these medications        FERRALET 90 90-1 MG Tabs  Take 1 tablet by mouth 2 (two) times daily.     ibuprofen 800 MG tablet  Commonly known as:  ADVIL,MOTRIN  Take 1 tablet (800 mg total) by mouth every 8 (eight) hours as needed (mild pain).     oxyCODONE-acetaminophen 5-325 MG per tablet  Commonly known as:  PERCOCET/ROXICET  Take 1-2 tablets by mouth every 4 (four) hours as needed (moderate to severe pain (when tolerating fluids)).         SignedCatha Brow. 03/18/2014, 5:22 PM

## 2015-03-11 ENCOUNTER — Telehealth: Payer: Self-pay | Admitting: Genetic Counselor

## 2015-03-11 ENCOUNTER — Encounter: Payer: Self-pay | Admitting: Genetic Counselor

## 2015-03-11 NOTE — Telephone Encounter (Signed)
verfied insurance/address/phone; faxed provider letter (Dr Nancy Fetter); scheduled intake; mailed new pt packet

## 2015-03-27 ENCOUNTER — Ambulatory Visit (HOSPITAL_BASED_OUTPATIENT_CLINIC_OR_DEPARTMENT_OTHER): Payer: BLUE CROSS/BLUE SHIELD | Admitting: Genetic Counselor

## 2015-03-27 ENCOUNTER — Other Ambulatory Visit: Payer: BLUE CROSS/BLUE SHIELD

## 2015-03-27 DIAGNOSIS — Z800 Family history of malignant neoplasm of digestive organs: Secondary | ICD-10-CM | POA: Diagnosis not present

## 2015-03-27 DIAGNOSIS — Z8042 Family history of malignant neoplasm of prostate: Secondary | ICD-10-CM

## 2015-03-27 DIAGNOSIS — Z809 Family history of malignant neoplasm, unspecified: Secondary | ICD-10-CM

## 2015-03-27 DIAGNOSIS — Z806 Family history of leukemia: Secondary | ICD-10-CM | POA: Diagnosis not present

## 2015-03-27 DIAGNOSIS — Z803 Family history of malignant neoplasm of breast: Secondary | ICD-10-CM | POA: Diagnosis not present

## 2015-03-28 ENCOUNTER — Encounter: Payer: Self-pay | Admitting: Gastroenterology

## 2015-04-03 ENCOUNTER — Encounter: Payer: Self-pay | Admitting: Genetic Counselor

## 2015-04-03 DIAGNOSIS — Z800 Family history of malignant neoplasm of digestive organs: Secondary | ICD-10-CM | POA: Insufficient documentation

## 2015-04-03 DIAGNOSIS — Z803 Family history of malignant neoplasm of breast: Secondary | ICD-10-CM | POA: Insufficient documentation

## 2015-04-03 NOTE — Progress Notes (Signed)
REFERRING PROVIDER: Donald Prose, MD [address], [place] [address]  PRIMARY PROVIDER:  Lynne Logan, MD  PRIMARY REASON FOR VISIT:  1. Family history of breast cancer in mother   2. Family history of pancreatic cancer   3. Family history of prostate cancer   4. Family history- stomach cancer   5. Family history of leukemia   6. Family history of cancer      HISTORY OF PRESENT ILLNESS:   Patricia Houston, a 50 y.o. female, was seen for a [facility] cancer genetics consultation at the request of Dr. Nancy Fetter due to a family history of breast and pancreatic cancer in her mother and additional family history of prostate cancer, leukemia, and other cancer.  Patricia Houston presents to clinic today with her mother, Geni Bers, to discuss the possibility of a hereditary predisposition to cancer, genetic testing, and to further clarify her future cancer risks, as well as potential cancer risks for family members.    Patricia Houston is a 50 y.o. female with no personal history of cancer.    CANCER HISTORY:   No history exists.     HORMONAL RISK FACTORS:  Menarche was at age 31.  First live birth at age 47.  OCP use for approximately pills for 3 years; NuvaRing for approximately 4 years; IUD for less than 1 year Ovaries intact: yes.  Hysterectomy: yes, partial for anemia. Menopausal status: postmenopausal.  HRT use: 0 years. Colonoscopy: yes; normal EGD in 2011 Mammogram within the last year: receives every 1-2 years. Number of breast biopsies: 0. Up to date with pelvic exams:  Is due for one now. Any excessive radiation exposure in the past:  no  Past Medical History  Diagnosis Date  . Anemia     TAKES IRON PILLS    Past Surgical History  Procedure Laterality Date  . Laparoscopic assisted vaginal hysterectomy N/A 02/27/2014    Procedure: LAPAROSCOPIC ASSISTED VAGINAL HYSTERECTOMY;  Surgeon: Maeola Sarah. Landry Mellow, MD;  Location: [facility];  Service: Gynecology;  Laterality: N/A;   abdomen to vagina to abdomen    Social History   Social History  . Marital Status: Married    Spouse Name: N/A  . Number of Children: N/A  . Years of Education: N/A   Social History Main Topics  . Smoking status: Never Smoker   . Smokeless tobacco: Never Used  . Alcohol Use: Yes     Comment: rare - 1-2 drinks every few months  . Drug Use: No  . Sexual Activity: Not Asked   Other Topics Concern  . None   Social History Narrative     FAMILY HISTORY:  We obtained a detailed, 4-generation family history.  Significant diagnoses are listed below: Family History  Problem Relation Age of Onset  . Breast cancer Mother     dx. late 11s; s/p mastectomy  . Pancreatic cancer Mother 1    adenocarcinoma  . Other Sister     one sister had a hysterectomy due to heavy bleeding at the age of 91  . Heart attack Maternal Uncle 41  . Heart attack Maternal Grandmother 83  . Prostate cancer Maternal Grandfather 67    "very aggressive"  . Leukemia Other 46    maternal great aunt (MGM's sister)  . Stomach cancer Other     maternal great aunt (MGF's sister) dx. over age 81  . Heart Problems Paternal Grandmother   . Colon cancer Neg Hx   . Cancer Other  maternal great uncle (MGM's brother) dx. NOS cancer at later age  . Breast cancer Cousin     (2) maternal "2nd cousins" dx. with breast cancers in their mid-50s    Patricia Houston has one daughter who is currently 55 years old.  Patricia Houston has three full sisters and one full brother, ages 36-46.  Her oldest sister has a history of a hysterectomy at the age of 70 due to heavy bleeding.  Patricia Houston mother has a recent diagnosis of adenocarcinoma of the pancreas; she also has a history of breast cancer diagnosed at the age of 24.  Her breast cancer was treated with a mastectomy; she has never had genetic testing.  Patricia Houston father is currently 4 and has not had cancer.  Patricia Houston mother has eight full brothers, two full  sisters, and one paternal half-brother.  One full brother died of a heart attack at the age of 69.  The remainder of Patricia Houston's maternal aunts and uncles are still living, between the ages of 52-66 and have never had cancer.  She reports no known history of cancer for any of her maternal first cousins.  Patricia Houston maternal grandmother died of a heart attack at the age of 69.  She had two full brothers and three full sisters.  One of these maternal great aunts has a current diagnosis of leukemia at the age of 78.  One maternal great uncle died of an unspecified type of cancer at a later age.  Patricia Houston maternal grandfather died of an aggressive prostate cancer at the age of 11.  He had three full brothers and six full sisters--one of these sisters was diagnosed with a stomach cancer at an age older than 26.  Patricia Houston mother also reports a two maternal "second cousins" of hers who have been diagnosed with breast cancers in their mid-50s.  Patricia Houston father has no full siblings, but has seven maternal half-brothers and one maternal half-sister.  One half-brother died "young" of either a heart attack or a stroke.  All of the other half-siblings are in their late 50s-early 60s and have never had cancer.  Patricia Houston is unaware of any cancers in any of her paternal first cousins.  Her paternal grandmother died from a heart-related cause in her early 67s.  Her paternal grandfather died of an unspecified cause in his mid-50s.  Patricia Houston and her mother had limited information for any other paternal great aunts/uncles or great grandparents.    They are unaware of any family history of genetic testing for hereditary cancer.  Patient's maternal and paternal ancestors are of African American descent. There is no reported Ashkenazi Jewish ancestry. There is no known consanguinity.  GENETIC COUNSELING ASSESSMENT: Patricia Houston is a 50 y.o. female with a family history of cancer which is  somewhat suggestive of a hereditary cancer syndrome and predisposition to cancer. We, therefore, discussed and recommended the following at today's visit.   DISCUSSION: We reviewed the characteristics, features and inheritance patterns of hereditary cancer syndromes, particularly those caused by mutations within the BRCA1/2, PALB2, and Lynch syndrome genes. We also discussed genetic testing, including the appropriate family members to test, the process of testing, insurance coverage and turn-around-time for results. We discussed the implications of a negative, positive and/or variant of uncertain significant result. We recommended Patricia Houston's mother pursue genetic testing for the 25-gene Custom Cancer Panel through [facility] in [place], [place].  We discussed also submitting a sample for  Patricia Houston to the lab and reflexing to testing for her, in the even that her mother tested positive.  Patricia Houston would like to wait for the results back for her mother before potentially submitting a sample for testing.   Based on the patient's personal and family history, a statistical model (online IBIS/Tyrer-Cuzick)  and literature data were used to estimate her risk of developing breast cancer. This estimates her lifetime risk of developing breast cancer to be approximately 20.4%. This estimation does not take into account any genetic testing results.  The patient's lifetime breast cancer risk is a preliminary estimate based on available information using one of several models endorsed by the [place] (ACS). The ACS recommends consideration of breast MRI screening as an adjunct to mammography for patients at high risk (defined as 20% or greater lifetime risk). A more detailed breast cancer risk assessment can be considered, if clinically indicated.   PLAN: Based on Patricia Houston. Miley's family history, we recommended her mother, who was diagnosed with breast cancer at age 58 and with pancreatic  cancer at age 36, have genetic counseling and testing. We submitted a test sample to [facility] for the 25-gene Custom Cancer Panel.  If Patricia Houston. Wilcoxson's mother test positive, then we will order testing for her.  If her mother tests negative, we will likely be reassured by this result and will not need to order testing for Patricia Houston. Mccoin.  Lastly, we encouraged Patricia Houston. Ebers to remain in contact with cancer genetics annually so that we can continuously update the family history and inform her of any changes in cancer genetics and testing that may be of benefit for this family.   Patricia Houston.  Stege questions were answered to her satisfaction today. Our contact information was provided should additional questions or concerns arise. Thank you for the referral and allowing [place] to share in the care of your patient.   Jeanine Luz, Patricia Houston, [facility] Certified Genetic Counselor [email or web address] Phone: [phone]  The patient was seen for a total of 40 minutes in face-to-face genetic counseling.  This patient was discussed with Drs. Magrinat, Lindi Adie and/or Burr Medico who agrees with the above.    _______________________________________________________________________ For Office Staff:  Number of people involved in session: 2 Was an Intern/ student involved with case: no

## 2016-01-28 DIAGNOSIS — Z803 Family history of malignant neoplasm of breast: Secondary | ICD-10-CM | POA: Diagnosis not present

## 2016-01-28 DIAGNOSIS — Z1231 Encounter for screening mammogram for malignant neoplasm of breast: Secondary | ICD-10-CM | POA: Diagnosis not present

## 2016-02-03 DIAGNOSIS — R922 Inconclusive mammogram: Secondary | ICD-10-CM | POA: Diagnosis not present

## 2016-07-01 DIAGNOSIS — H5213 Myopia, bilateral: Secondary | ICD-10-CM | POA: Diagnosis not present

## 2017-02-14 DIAGNOSIS — Z803 Family history of malignant neoplasm of breast: Secondary | ICD-10-CM | POA: Diagnosis not present

## 2017-02-14 DIAGNOSIS — Z1231 Encounter for screening mammogram for malignant neoplasm of breast: Secondary | ICD-10-CM | POA: Diagnosis not present

## 2017-02-24 DIAGNOSIS — R922 Inconclusive mammogram: Secondary | ICD-10-CM | POA: Diagnosis not present

## 2017-03-24 DIAGNOSIS — Z0000 Encounter for general adult medical examination without abnormal findings: Secondary | ICD-10-CM | POA: Diagnosis not present

## 2017-03-24 DIAGNOSIS — E559 Vitamin D deficiency, unspecified: Secondary | ICD-10-CM | POA: Diagnosis not present

## 2017-03-24 DIAGNOSIS — E785 Hyperlipidemia, unspecified: Secondary | ICD-10-CM | POA: Diagnosis not present

## 2017-03-24 DIAGNOSIS — D509 Iron deficiency anemia, unspecified: Secondary | ICD-10-CM | POA: Diagnosis not present

## 2017-03-24 DIAGNOSIS — Z23 Encounter for immunization: Secondary | ICD-10-CM | POA: Diagnosis not present

## 2017-06-27 DIAGNOSIS — E559 Vitamin D deficiency, unspecified: Secondary | ICD-10-CM | POA: Diagnosis not present

## 2018-02-15 DIAGNOSIS — Z1231 Encounter for screening mammogram for malignant neoplasm of breast: Secondary | ICD-10-CM | POA: Diagnosis not present

## 2018-02-15 DIAGNOSIS — Z803 Family history of malignant neoplasm of breast: Secondary | ICD-10-CM | POA: Diagnosis not present

## 2018-02-28 DIAGNOSIS — N6311 Unspecified lump in the right breast, upper outer quadrant: Secondary | ICD-10-CM | POA: Diagnosis not present

## 2018-02-28 DIAGNOSIS — N6331 Unspecified lump in axillary tail of the right breast: Secondary | ICD-10-CM | POA: Diagnosis not present

## 2018-03-08 ENCOUNTER — Other Ambulatory Visit: Payer: Self-pay | Admitting: Radiology

## 2018-03-08 DIAGNOSIS — N6331 Unspecified lump in axillary tail of the right breast: Secondary | ICD-10-CM | POA: Diagnosis not present

## 2018-03-08 DIAGNOSIS — N62 Hypertrophy of breast: Secondary | ICD-10-CM | POA: Diagnosis not present

## 2018-03-08 DIAGNOSIS — D0511 Intraductal carcinoma in situ of right breast: Secondary | ICD-10-CM | POA: Diagnosis not present

## 2018-03-08 DIAGNOSIS — N6315 Unspecified lump in the right breast, overlapping quadrants: Secondary | ICD-10-CM | POA: Diagnosis not present

## 2018-03-15 ENCOUNTER — Encounter: Payer: Self-pay | Admitting: *Deleted

## 2018-03-15 ENCOUNTER — Telehealth: Payer: Self-pay | Admitting: Hematology and Oncology

## 2018-03-15 DIAGNOSIS — D0511 Intraductal carcinoma in situ of right breast: Secondary | ICD-10-CM

## 2018-03-15 NOTE — Telephone Encounter (Signed)
Spoke to patient to confirm afternoon [facility] appointment for 3/18, [facility] patient, appointment reminder sent

## 2018-03-20 ENCOUNTER — Encounter: Payer: Self-pay | Admitting: Licensed Clinical Social Worker

## 2018-03-21 NOTE — Progress Notes (Signed)
Radiation Oncology         [phone] ________________________________  Initial outpatient Consultation  Name: Patricia Houston MRN: [id number]  Date: 03/22/2018  DOB: 1965/09/23  OV:ZCHYIFOY, Virgie Dad, MD  Erroll Luna, MD   REFERRING PHYSICIAN: Erroll Luna, MD  DIAGNOSIS:    ICD-10-CM   1. Ductal carcinoma in situ (DCIS) of right breast D05.11    Stage 0 Right Breast UIQ Ductal Carcinoma In Situ, ER+ / PR+, Grade Intermediate  Cancer Staging Ductal carcinoma in situ (DCIS) of right breast Staging form: Breast, AJCC 8th Edition - Clinical stage from 03/22/2018: Stage 0 (cTis (DCIS), cN0, cM0, ER+, PR+) - Unsigned  CHIEF COMPLAINT: Here to discuss management of right breast DCIS  HISTORY OF PRESENT ILLNESS::Patricia Houston is a 53 y.o. female who presented with breast abnormality on the following imaging: screening mammogram on the date of 02/15/2018.  Symptoms, if any, at that time, were: none.   Ultrasound of breast on 02/28/2018 revealed 1.5 cm oval mass in the right axillary tail at 10:30 suspicious for malignancy, and a lymph node with focal cortical thickening in the right axilla suspicious of malignancy.   Biopsy on date of 03/08/2018 showed ductal carcinoma in situ at 3 o'clock.  Prognostic marker results ER 50% PR 50%; intermediate grade. 10:30 o'clock biopsy showed no malignancy; 10:30 o'clock right axillary tail lymph node showed features consistent with ruptured cyst, however malignancy cannot be excluded on limited biopsy material.  On review of systems she reports glasses, contacts, sinus problems, dry cough, productive cough, back pain, headaches, anxiety, depression, anemia  PREVIOUS RADIATION THERAPY: No  PAST MEDICAL HISTORY:  has a past medical history of Anemia.    PAST SURGICAL HISTORY: Past Surgical History:  Procedure Laterality Date  . LAPAROSCOPIC ASSISTED VAGINAL HYSTERECTOMY N/A 02/27/2014   Procedure: LAPAROSCOPIC ASSISTED VAGINAL  HYSTERECTOMY;  Surgeon: Maeola Gerritt Galentine. Landry Mellow, MD;  Location: [facility];  Service: Gynecology;  Laterality: N/A;  abdomen to vagina to abdomen    FAMILY HISTORY: family history includes Breast cancer in her cousin and mother; Cancer in an other family member; Heart Problems in her paternal grandmother; Heart attack (age of onset: 47) in her maternal uncle; Heart attack (age of onset: 21) in her maternal grandmother; Leukemia (age of onset: 82) in an other family member; Other in her sister; Pancreatic cancer (age of onset: 26) in her mother; Prostate cancer (age of onset: 75) in her maternal grandfather; Stomach cancer in an other family member.  SOCIAL HISTORY:  reports that she has never smoked. She has never used smokeless tobacco. She reports current alcohol use. She reports that she does not use drugs.  ALLERGIES: Patient has no known allergies.  MEDICATIONS:  Current Outpatient Medications  Medication Sig Dispense Refill  . Fe Cbn-Fe Gluc-FA-B12-C-DSS (FERRALET 90) 90-1 MG TABS Take 1 tablet by mouth 2 (two) times daily.    Marland Kitchen ibuprofen (ADVIL,MOTRIN) 800 MG tablet Take 1 tablet (800 mg total) by mouth every 8 (eight) hours as needed (mild pain). (Patient not taking: Reported on 03/22/2018) 30 tablet 1  . loratadine (CLARITIN) 10 MG tablet Take 10 mg by mouth daily as needed for allergies.    Marland Kitchen oxyCODONE-acetaminophen (PERCOCET/ROXICET) 5-325 MG per tablet Take 1-2 tablets by mouth every 4 (four) hours as needed (moderate to severe pain (when tolerating fluids)). (Patient not taking: Reported on 03/22/2018) 30 tablet 0   No current facility-administered medications for this encounter.     REVIEW OF SYSTEMS: A 10+ POINT  REVIEW OF SYSTEMS WAS OBTAINED including neurology, dermatology, psychiatry, cardiac, respiratory, lymph, extremities, GI, GU, Musculoskeletal, constitutional, breasts, reproductive, HEENT.  All pertinent positives are noted in the HPI.  All others are negative.   PHYSICAL EXAM:   Vitals  with Age-Percentiles 03/22/2018  Length 299.2 cm  Systolic 426  Diastolic 85  Pulse 86  Respiration 18  Weight 82.328 kg  BMI 27.6  VISIT REPORT    General: Alert and oriented, in no acute distress HEENT: Head is normocephalic. Extraocular movements are intact. Oropharynx is clear. Neck: Neck is supple, no palpable cervical or supraclavicular lymphadenopathy. Heart: Regular in rate and rhythm with no murmurs, rubs, or gallops. Chest: Clear to auscultation bilaterally, with no rhonchi, wheezes, or rales. Abdomen: Soft, nontender, nondistended, with no rigidity or guarding. Extremities: No cyanosis or edema. Lymphatics: see Neck Exam Skin: No concerning lesions. Musculoskeletal: symmetric strength and muscle tone throughout. Neurologic: Cranial nerves II through XII are grossly intact. No obvious focalities. Speech is fluent. Coordination is intact. Psychiatric: Judgment and insight are intact. Affect is appropriate. Breasts: thickening , non specific, at 3:00 of right breast . No other palpable masses appreciated in the breasts or axillae .  ECOG = 0  0 - Asymptomatic (Fully active, able to carry on all predisease activities without restriction)  1 - Symptomatic but completely ambulatory (Restricted in physically strenuous activity but ambulatory and able to carry out work of a light or sedentary nature. For example, light housework, office work)  2 - Symptomatic, <50% in bed during the day (Ambulatory and capable of all self care but unable to carry out any work activities. Up and about more than 50% of waking hours)  3 - Symptomatic, >50% in bed, but not bedbound (Capable of only limited self-care, confined to bed or chair 50% or more of waking hours)  4 - Bedbound (Completely disabled. Cannot carry on any self-care. Totally confined to bed or chair)  5 - Death   Eustace Pen MM, Creech RH, Tormey DC, et al. [phone]). "Toxicity and response criteria of the [facility]  Group". [place] Oncol. 5 (6): 649-55   LABORATORY DATA:  Lab Results  Component Value Date   WBC 5.4 03/22/2018   HGB 13.2 03/22/2018   HCT 41.5 03/22/2018   MCV 89.4 03/22/2018   PLT 221 03/22/2018   CMP     Component Value Date/Time   NA 139 03/22/2018 1242   K 4.0 03/22/2018 1242   CL 104 03/22/2018 1242   CO2 27 03/22/2018 1242   GLUCOSE 110 (H) 03/22/2018 1242   BUN 9 03/22/2018 1242   CREATININE 0.89 03/22/2018 1242   CALCIUM 9.4 03/22/2018 1242   PROT 7.4 03/22/2018 1242   ALBUMIN 3.8 03/22/2018 1242   AST 13 (L) 03/22/2018 1242   ALT 13 03/22/2018 1242   ALKPHOS 94 03/22/2018 1242   BILITOT 0.4 03/22/2018 1242   GFRNONAA >60 03/22/2018 1242   GFRAA >60 03/22/2018 1242         RADIOGRAPHY:  As above     IMPRESSION/PLAN: Right Breast DCIS   She has been discussed at our multidisciplinary tumor board.  The consensus is that she would be a good candidate for breast conservation. I talked to her about the option of a mastectomy and informed her that her expected overall survival would be equivalent between mastectomy and breast conservation, based upon randomized controlled data. She is enthusiastic about breast conservation.  It was a pleasure meeting the patient today.  We discussed the risks, benefits, and side effects of radiotherapy. I recommend radiotherapy to the right breast to reduce her risk of locoregional recurrence by 1/2.  We discussed that radiation would take approximately 4 weeks to complete and that I would give the patient a few weeks to heal following surgery before starting treatment planning.    We spoke about acute effects including skin irritation and fatigue as well as much less common late effects including internal organ injury or irritation. We spoke about the latest technology that is used to minimize the risk of late effects for patients undergoing radiotherapy to the breast or chest wall. No guarantees of treatment were given. The  patient is enthusiastic about proceeding with treatment. I look forward to participating in the patient's care.  I will await her referral back to me for postoperative follow-up and eventual CT simulation/treatment planning.     __________________________________________   Eppie Gibson, MD   This document serves as a record of services personally performed by Eppie Gibson, MD. It was created on her behalf by Wilburn Mylar, a trained medical scribe. The creation of this record is based on the scribe's personal observations and the provider's statements to them. This document has been checked and approved by the attending provider.

## 2018-03-21 NOTE — Progress Notes (Signed)
[facility] NOTE  Patient Care Team: Magrinat, Virgie Dad, MD as PCP - General (Oncology) Mauro Kaufmann, RN as Oncology Nurse Navigator Rockwell Germany, RN as Oncology Nurse Navigator Erroll Luna, MD as Consulting Physician (General Surgery) Nicholas Lose, MD as Consulting Physician (Hematology and Oncology) Eppie Gibson, MD as Attending Physician (Radiation Oncology)  CHIEF COMPLAINTS/PURPOSE OF CONSULTATION: Newly diagnosed breast cancer  HISTORY OF PRESENTING ILLNESS:  Patricia Houston 53 y.o. female is here because of recent diagnosis of right breast cancer.  The cancer was detected on a screening mammogram on 02/15/18. A diagnostic mammogram and [place] on 02/28/18 showed a 1.5cm mass in the axillary tail of the right breast and a lymph node with cortical thickening in the right axilla. A biopsy on 03/08/18 showed DCIS with hormone receptor testing pending.   She presents to the clinic today accompanied by her husband to discuss her treatment plan.  She was presented this morning to the multidisciplinary breast tumor board.  She reports a family history of breast cancer in her mother at age 55 and two maternal cousins, prostate cancer in her maternal grandfather, and pancreatic cancer in her mother. She has a history of hysterectomy. Genetic testing was done in 03/2015 on her mother due to her family history.   I reviewed her records extensively and collaborated the history with the patient.  SUMMARY OF ONCOLOGIC HISTORY:   Ductal carcinoma in situ (DCIS) of right breast   03/08/2018 Initial Diagnosis    Screening mammogram detected right breast 1.5 cm oval nodule UOQ/axillary tail posterior depth 9 cm from the nipple, lymph node with focal cortical thickening right axilla, biopsy revealed intermediate grade DCIS at 3 o'clock position,PASH at 10:30 position, lymph node: Benign however limited material malignancy not excluded.  TisNX stage 0     MEDICAL HISTORY:   Past Medical History:  Diagnosis Date  . Anemia    TAKES IRON PILLS    SURGICAL HISTORY: Past Surgical History:  Procedure Laterality Date  . LAPAROSCOPIC ASSISTED VAGINAL HYSTERECTOMY N/A 02/27/2014   Procedure: LAPAROSCOPIC ASSISTED VAGINAL HYSTERECTOMY;  Surgeon: Maeola Sarah. Landry Mellow, MD;  Location: [facility];  Service: Gynecology;  Laterality: N/A;  abdomen to vagina to abdomen    SOCIAL HISTORY: Social History   Socioeconomic History  . Marital status: Married    Spouse name: Not on file  . Number of children: Not on file  . Years of education: Not on file  . Highest education level: Not on file  Occupational History  . Not on file  Social Needs  . Financial resource strain: Not on file  . Food insecurity:    Worry: Not on file    Inability: Not on file  . Transportation needs:    Medical: Not on file    Non-medical: Not on file  Tobacco Use  . Smoking status: Never Smoker  . Smokeless tobacco: Never Used  Substance and Sexual Activity  . Alcohol use: Yes    Comment: rare - 1-2 drinks every few months  . Drug use: No  . Sexual activity: Not on file  Lifestyle  . Physical activity:    Days per week: Not on file    Minutes per session: Not on file  . Stress: Not on file  Relationships  . Social connections:    Talks on phone: Not on file    Gets together: Not on file    Attends religious service: Not on file    Active  member of club or organization: Not on file    Attends meetings of clubs or organizations: Not on file    Relationship status: Not on file  . Intimate partner violence:    Fear of current or ex partner: Not on file    Emotionally abused: Not on file    Physically abused: Not on file    Forced sexual activity: Not on file  Other Topics Concern  . Not on file  Social History Narrative  . Not on file    FAMILY HISTORY: Family History  Problem Relation Age of Onset  . Breast cancer Mother        dx. late 43s; s/p mastectomy  . Pancreatic cancer  Mother 45       adenocarcinoma  . Other Sister        one sister had a hysterectomy due to heavy bleeding at the age of 23  . Heart attack Maternal Uncle 41  . Heart attack Maternal Grandmother 83  . Prostate cancer Maternal Grandfather 2       "very aggressive"  . Leukemia Other 77       maternal great aunt (MGM's sister)  . Stomach cancer Other        maternal great aunt (MGF's sister) dx. over age 80  . Heart Problems Paternal Grandmother   . Cancer Other        maternal great uncle (MGM's brother) dx. NOS cancer at later age  . Breast cancer Cousin        (2) maternal "2nd cousins" dx. with breast cancers in their mid-50s  . Colon cancer Neg Hx     ALLERGIES:  has No Known Allergies.  MEDICATIONS:  Current Outpatient Medications  Medication Sig Dispense Refill  . Fe Cbn-Fe Gluc-FA-B12-C-DSS (FERRALET 90) 90-1 MG TABS Take 1 tablet by mouth 2 (two) times daily.    Marland Kitchen loratadine (CLARITIN) 10 MG tablet Take 10 mg by mouth daily as needed for allergies.    Marland Kitchen ibuprofen (ADVIL,MOTRIN) 800 MG tablet Take 1 tablet (800 mg total) by mouth every 8 (eight) hours as needed (mild pain). (Patient not taking: Reported on 03/22/2018) 30 tablet 1  . oxyCODONE-acetaminophen (PERCOCET/ROXICET) 5-325 MG per tablet Take 1-2 tablets by mouth every 4 (four) hours as needed (moderate to severe pain (when tolerating fluids)). (Patient not taking: Reported on 03/22/2018) 30 tablet 0   No current facility-administered medications for this visit.     REVIEW OF SYSTEMS:   Constitutional: Denies fevers, chills or abnormal night sweats Eyes: Denies blurriness of vision, double vision or watery eyes Ears, nose, mouth, throat, and face: Denies mucositis or sore throat Respiratory: Denies cough, dyspnea or wheezes Cardiovascular: Denies palpitation, chest discomfort or lower extremity swelling Gastrointestinal:  Denies nausea, heartburn or change in bowel habits Skin: Denies abnormal skin rashes  Lymphatics: Denies new lymphadenopathy or easy bruising Neurological:Denies numbness, tingling or new weaknesses Behavioral/Psych: Mood is stable, no new changes  Breast: Small right axillary palpable nodule for the last 2 years All other systems were reviewed with the patient and are negative.  PHYSICAL EXAMINATION: ECOG PERFORMANCE STATUS: 1 - Symptomatic but completely ambulatory  Vitals:   03/22/18 1305  BP: 131/85  Pulse: 86  Resp: 18  Temp: 98.4 F (36.9 C)  SpO2: 99%   Filed Weights   03/22/18 1305  Weight: 181 lb 8 oz (82.3 kg)    GENERAL:alert, no distress and comfortable SKIN: skin color, texture, turgor are normal, no rashes or  significant lesions EYES: normal, conjunctiva are pink and non-injected, sclera clear OROPHARYNX:no exudate, no erythema and lips, buccal mucosa, and tongue normal  NECK: supple, thyroid normal size, non-tender, without nodularity LYMPH:  no palpable lymphadenopathy in the cervical, axillary or inguinal LUNGS: clear to auscultation and percussion with normal breathing effort HEART: regular rate & rhythm and no murmurs and no lower extremity edema ABDOMEN:abdomen soft, non-tender and normal bowel sounds Musculoskeletal:no cyanosis of digits and no clubbing  PSYCH: alert & oriented x 3 with fluent speech NEURO: no focal motor/sensory deficits BREAST: No palpable nodules in breast.  Right axillary palpable nodule which was benign on biopsy.  (exam performed in the presence of a chaperone)   LABORATORY DATA:  I have reviewed the data as listed Lab Results  Component Value Date   WBC 5.4 03/22/2018   HGB 13.2 03/22/2018   HCT 41.5 03/22/2018   MCV 89.4 03/22/2018   PLT 221 03/22/2018   Lab Results  Component Value Date   NA 139 03/22/2018   K 4.0 03/22/2018   CL 104 03/22/2018   CO2 27 03/22/2018    RADIOGRAPHIC STUDIES: I have personally reviewed the radiological reports and agreed with the findings in the report.  ASSESSMENT  AND PLAN:  Ductal carcinoma in situ (DCIS) of right breast 03/08/2018:Screening mammogram detected right breast asymmetry measuring 9 mm which was DCIS and 1.5 cm oval nodule UOQ/axillary tail posterior depth 9 cm from the nipple which was benign lymph node, biopsy revealed intermediate grade DCIS at 3 o'clock position,PASH at 10:30 position, lymph node: Benign however limited material malignancy not excluded.  TisNX stage 0   Pathology review: I discussed with the patient the difference between DCIS and invasive breast cancer. It is considered a precancerous lesion. DCIS is classified as a 0. It is generally detected through mammograms as calcifications. We discussed the significance of grades and its impact on prognosis. We also discussed the importance of ER and PR receptors and their implications to adjuvant treatment options. Prognosis of DCIS dependence on grade, comedo necrosis. It is anticipated that if not treated, 20-30% of DCIS can develop into invasive breast cancer.  Recommendation: 1. Breast conserving surgery 2. Followed by adjuvant radiation therapy 3. Followed by antiestrogen therapy with tamoxifen 5 years  Tamoxifen counseling: We discussed the risks and benefits of tamoxifen. These include but not limited to insomnia, hot flashes, mood changes, vaginal dryness, and weight gain. Although rare, serious side effects including endometrial cancer, risk of blood clots were also discussed. We strongly believe that the benefits far outweigh the risks. Patient understands these risks and consented to starting treatment. Planned treatment duration is 5 years.  Return to clinic after surgery to discuss the final pathology report and come up with an adjuvant treatment plan.   All questions were answered. The patient knows to call the clinic with any problems, questions or concerns.   Nicholas Lose, MD 03/22/2018   I, Cloyde Reams Dorshimer, am acting as scribe for Nicholas Lose, MD.  I have  reviewed the above documentation for accuracy and completeness, and I agree with the above.

## 2018-03-22 ENCOUNTER — Inpatient Hospital Stay: Payer: BLUE CROSS/BLUE SHIELD

## 2018-03-22 ENCOUNTER — Ambulatory Visit: Payer: Self-pay | Admitting: Surgery

## 2018-03-22 ENCOUNTER — Other Ambulatory Visit: Payer: Self-pay

## 2018-03-22 ENCOUNTER — Encounter: Payer: Self-pay | Admitting: Radiation Oncology

## 2018-03-22 ENCOUNTER — Ambulatory Visit: Payer: BLUE CROSS/BLUE SHIELD | Admitting: Physical Therapy

## 2018-03-22 ENCOUNTER — Ambulatory Visit (HOSPITAL_BASED_OUTPATIENT_CLINIC_OR_DEPARTMENT_OTHER): Payer: BLUE CROSS/BLUE SHIELD | Admitting: Genetic Counselor

## 2018-03-22 ENCOUNTER — Encounter: Payer: Self-pay | Admitting: Hematology and Oncology

## 2018-03-22 ENCOUNTER — Inpatient Hospital Stay: Payer: BLUE CROSS/BLUE SHIELD | Attending: Hematology and Oncology | Admitting: Hematology and Oncology

## 2018-03-22 ENCOUNTER — Ambulatory Visit
Admission: RE | Admit: 2018-03-22 | Discharge: 2018-03-22 | Disposition: A | Discharge status: Home / Self Care | Payer: BLUE CROSS/BLUE SHIELD | Source: Ambulatory Visit | Attending: Radiation Oncology | Admitting: Radiation Oncology

## 2018-03-22 DIAGNOSIS — Z803 Family history of malignant neoplasm of breast: Secondary | ICD-10-CM

## 2018-03-22 DIAGNOSIS — D0511 Intraductal carcinoma in situ of right breast: Secondary | ICD-10-CM | POA: Diagnosis not present

## 2018-03-22 DIAGNOSIS — Z800 Family history of malignant neoplasm of digestive organs: Secondary | ICD-10-CM

## 2018-03-22 DIAGNOSIS — Z809 Family history of malignant neoplasm, unspecified: Secondary | ICD-10-CM

## 2018-03-22 DIAGNOSIS — Z7289 Other problems related to lifestyle: Secondary | ICD-10-CM

## 2018-03-22 DIAGNOSIS — Z791 Long term (current) use of non-steroidal anti-inflammatories (NSAID): Secondary | ICD-10-CM

## 2018-03-22 DIAGNOSIS — Z806 Family history of leukemia: Secondary | ICD-10-CM

## 2018-03-22 DIAGNOSIS — Z8249 Family history of ischemic heart disease and other diseases of the circulatory system: Secondary | ICD-10-CM | POA: Diagnosis not present

## 2018-03-22 DIAGNOSIS — Z8042 Family history of malignant neoplasm of prostate: Secondary | ICD-10-CM

## 2018-03-22 DIAGNOSIS — N6310 Unspecified lump in the right breast, unspecified quadrant: Secondary | ICD-10-CM | POA: Diagnosis not present

## 2018-03-22 DIAGNOSIS — Z170 Estrogen receptor positive status [ER+]: Secondary | ICD-10-CM | POA: Diagnosis not present

## 2018-03-22 LAB — CMP (CANCER CENTER ONLY)
ALT: 13 U/L (ref 0–44)
AST: 13 U/L — ABNORMAL LOW (ref 15–41)
Albumin: 3.8 g/dL (ref 3.5–5.0)
Alkaline Phosphatase: 94 U/L (ref 38–126)
Anion gap: 8 (ref 5–15)
BUN: 9 mg/dL (ref 6–20)
CO2: 27 mmol/L (ref 22–32)
Calcium: 9.4 mg/dL (ref 8.9–10.3)
Chloride: 104 mmol/L (ref 98–111)
Creatinine: 0.89 mg/dL (ref 0.44–1.00)
GFR, Est AFR Am: >60 mL/min (ref >60)
GFR, Estimated: >60 mL/min (ref >60)
Glucose, Bld: 110 mg/dL — ABNORMAL HIGH (ref 70–99)
Potassium: 4 mmol/L (ref 3.5–5.1)
Sodium: 139 mmol/L (ref 135–145)
Total Bilirubin: 0.4 mg/dL (ref 0.3–1.2)
Total Protein: 7.4 g/dL (ref 6.5–8.1)

## 2018-03-22 LAB — CBC WITH DIFFERENTIAL (CANCER CENTER ONLY)
Abs Immature Granulocytes: 0.02 10*3/uL (ref 0.00–0.07)
Basophils Absolute: 0 10*3/uL (ref 0.0–0.1)
Basophils Relative: 1 %
Eosinophils Absolute: 0.2 10*3/uL (ref 0.0–0.5)
Eosinophils Relative: 4 %
HCT: 41.5 % (ref 36.0–46.0)
Hemoglobin: 13.2 g/dL (ref 12.0–15.0)
Immature Granulocytes: 0 %
Lymphocytes Relative: 29 %
Lymphs Abs: 1.6 10*3/uL (ref 0.7–4.0)
MCH: 28.4 pg (ref 26.0–34.0)
MCHC: 31.8 g/dL (ref 30.0–36.0)
MCV: 89.4 fL (ref 80.0–100.0)
Monocytes Absolute: 0.4 10*3/uL (ref 0.1–1.0)
Monocytes Relative: 8 %
Neutro Abs: 3.1 10*3/uL (ref 1.7–7.7)
Neutrophils Relative %: 58 %
Platelet Count: 221 10*3/uL (ref 150–400)
RBC: 4.64 MIL/uL (ref 3.87–5.11)
RDW: 12.7 % (ref 11.5–15.5)
WBC Count: 5.4 10*3/uL (ref 4.0–10.5)
nRBC: 0 % (ref 0.0–0.2)

## 2018-03-22 NOTE — H&P (Signed)
[name] Documented: 03/22/2018 11:27 AM Location: [facility] Patient #: [id number] DOB: 11-18-1965 Undefined / Language: Suszanne Conners / Race: White Female  History of Present Illness Marcello Moores A. Nyomi Howser MD; 03/22/2018 3:09 PM) Patient words: Pt sent at the request of Dr Huel Cote for SDM abnormality right breast 0.9 cm 3 oclock DCIS  mass in axilla felt to be lymphoid in nature but excision recommended  no hx of mass pain or discharge  family hx of breast cancer                   Diagnosis 1. Breast, right, needle core biopsy, 3 o'clock 1cmfn - DUCTAL CARCINOMA IN SITU - SEE COMMENT 2. Breast, right, needle core biopsy, 10:30 o'clock, 1cmfn - PSEUDOANGIOMATOUS STROMAL HYPERPLASIA - NO MALIGNANCY IDENTIFIED 3. Lymph node, needle/core biopsy, right axillary tail, 10:30 o'clock 9cmfn - LYMPHOID TISSUE WITH EPITHELIAL COMPONENT - SEE COMMENT Microscopic Comment 1. Immunohistochemistry was performed on the biopsy to assess for an invasive component (SMM, calponin, and p63). Myoepithelial cells are present supporting the diagnosis of ductal carcinoma in situ. Based on the biopsy, the ductal carcinoma in situ has a papillary pattern, intermediate nuclear grade and measures 0.6 cm in greatest linear extent. Prognostic markers (ER/PR) are pending and will be reported in an addendum. Dr. Jeannie Done has reviewed the case and agrees with the diagnosis. The results were called to [facility] on March 10, 2018. 3. CD68 highlights increased histiocytes. Cytokeratin AE1/3 highlights an epithelial component which histologically looks benign and squamoid. The overall features are consistent with a ruptured cyst; however, malignancy cannot be excluded on limited biopsy material. Thressa Sheller MD Pathologist, Electronic Signature (Case signed 03/10/2018) Specimen.  The patient is a 53 year old female.   Medication History Tawni Pummel, RN; 03/22/2018 11:28  AM) Medications Reconciled     Review of Systems (Miko Sirico A. Clennon Nasca MD; 03/22/2018 3:15 PM) All other systems negative   Physical Exam (Mckinna Demars A. Kiyaan Haq MD; 03/22/2018 3:09 PM)  General Mental Status-Alert. General Appearance-Consistent with stated age. Hydration-Well hydrated. Voice-Normal.  Head and Neck Head-normocephalic, atraumatic with no lesions or palpable masses. Trachea-midline. Thyroid Gland Characteristics - normal size and consistency.  Chest and Lung Exam Chest and lung exam reveals -quiet, even and easy respiratory effort with no use of accessory muscles and on auscultation, normal breath sounds, no adventitious sounds and normal vocal resonance. Inspection Chest Wall - Normal. Back - normal.  Breast Breast - Left-Symmetric, Non Tender, No Biopsy scars, no Dimpling, No Inflammation, No Lumpectomy scars, No Mastectomy scars, No Peau d' Orange. Breast - Right-Symmetric, Non Tender, No Biopsy scars, no Dimpling, No Inflammation, No Lumpectomy scars, No Mastectomy scars, No Peau d' Orange. Breast Lump-No Palpable Breast Mass.  Cardiovascular Cardiovascular examination reveals -normal heart sounds, regular rate and rhythm with no murmurs and normal pedal pulses bilaterally.  Neurologic Neurologic evaluation reveals -alert and oriented x 3 with no impairment of recent or remote memory. Mental Status-Normal.  Musculoskeletal Normal Exam - Left-Upper Extremity Strength Normal and Lower Extremity Strength Normal. Normal Exam - Right-Upper Extremity Strength Normal and Lower Extremity Strength Normal.  Lymphatic Head & Neck  General Head & Neck Lymphatics: Bilateral - Description - Normal. Axillary  General Axillary Region: Bilateral - Description - Normal. Tenderness - Non Tender.    Assessment & Plan (Kadince Boxley A. Tanaisha Pittman MD; 03/22/2018 3:11 PM)  BREAST NEOPLASM, TIS (DCIS), RIGHT (D05.11) Impression: pt has opted for right  breast lumpectomy  Risk of lumpectomy include bleeding,  infection, seroma, more surgery, use of seed/wire, wound care, cosmetic deformity and the need for other treatments, death , blood clots, death. Pt agrees to proceed.   MASS OF RIGHT BREAST ON MAMMOGRAM (N63.10) Impression: right breat lumpectomy for lymphoid tissu Risk of lumpectomy include bleeding, infection, seroma, more surgery, use of seed/wire, wound care, cosmetic deformity and the need for other treatments, death , blood clots, death. Pt agrees to proceed. e in axilla  Current Plans You are being scheduled for surgery- Our schedulers will call you.  You should hear from our office's scheduling department within 5 working days about the location, date, and time of surgery. We try to make accommodations for patient's preferences in scheduling surgery, but sometimes the OR schedule or the surgeon's schedule prevents [place] from making those accommodations.  If you have not heard from our office [phone]) in 5 working days, call the office and ask for your surgeon's nurse.  If you have other questions about your diagnosis, plan, or surgery, call the office and ask for your surgeon's nurse.  Pt Education - [facility] Breast Cancer Information Given - [facility] "Breast Journey" Package We discussed the staging and pathophysiology of breast cancer. We discussed all of the different options for treatment for breast cancer including surgery, chemotherapy, radiation therapy, Herceptin, and antiestrogen therapy. We discussed a sentinel lymph node biopsy as she does not appear to having lymph node involvement right now. We discussed the performance of that with injection of radioactive tracer and blue dye. We discussed that she would have an incision underneath her axillary hairline. We discussed that there is a bout a 10-20% chance of having a positive node with a sentinel lymph node biopsy and we will await the permanent pathology to make any other  first further decisions in terms of her treatment. One of these options might be to return to the operating room to perform an axillary lymph node dissection. We discussed about a 1-2% risk lifetime of chronic shoulder pain as well as lymphedema associated with a sentinel lymph node biopsy. We discussed the options for treatment of the breast cancer which included lumpectomy versus a mastectomy. We discussed the performance of the lumpectomy with a wire placement. We discussed a 10-20% chance of a positive margin requiring reexcision in the operating room. We also discussed that she may need radiation therapy or antiestrogen therapy or both if she undergoes lumpectomy. We discussed the mastectomy and the postoperative care for that as well. We discussed that there is no difference in her survival whether she undergoes lumpectomy with radiation therapy or antiestrogen therapy versus a mastectomy. There is a slight difference in the local recurrence rate being 3-5% with lumpectomy and about 1% with a mastectomy. We discussed the risks of operation including bleeding, infection, possible reoperation. She understands her further therapy will be based on what her stages at the time of her operation.  Pt Education - [facility] breast cancer surgery: discussed with patient and provided information.

## 2018-03-22 NOTE — Assessment & Plan Note (Signed)
03/08/2018:Screening mammogram detected right breast 1.5 cm oval nodule UOQ/axillary tail posterior depth 9 cm from the nipple, lymph node with focal cortical thickening right axilla, biopsy revealed intermediate grade DCIS at 3 o'clock position,PASH at 10:30 position, lymph node: Benign however limited material malignancy not excluded.  TisNX stage 0   Pathology review: I discussed with the patient the difference between DCIS and invasive breast cancer. It is considered a precancerous lesion. DCIS is classified as a 0. It is generally detected through mammograms as calcifications. We discussed the significance of grades and its impact on prognosis. We also discussed the importance of ER and PR receptors and their implications to adjuvant treatment options. Prognosis of DCIS dependence on grade, comedo necrosis. It is anticipated that if not treated, 20-30% of DCIS can develop into invasive breast cancer.  Recommendation: 1. Breast conserving surgery 2. Followed by adjuvant radiation therapy 3. Followed by antiestrogen therapy with tamoxifen 5 years  Tamoxifen counseling: We discussed the risks and benefits of tamoxifen. These include but not limited to insomnia, hot flashes, mood changes, vaginal dryness, and weight gain. Although rare, serious side effects including endometrial cancer, risk of blood clots were also discussed. We strongly believe that the benefits far outweigh the risks. Patient understands these risks and consented to starting treatment. Planned treatment duration is 5 years.  Return to clinic after surgery to discuss the final pathology report and come up with an adjuvant treatment plan.

## 2018-03-22 NOTE — Progress Notes (Signed)
REFERRING PROVIDER: Donald Prose, MD [address], [place] [address]  PRIMARY PROVIDER:  Magrinat, Virgie Dad, MD  PRIMARY REASON FOR VISIT:  1. Ductal carcinoma in situ (DCIS) of right breast   2. Family history of breast cancer in mother   45. Family history of pancreatic cancer      HISTORY OF PRESENT ILLNESS:   Ms. Woodring, a 53 y.o. female, was seen for a [facility] cancer genetics consultation at the request of Dr. Nancy Fetter due to a personal and family history of cancer.  Ms. Strider presents to clinic today to discuss the possibility of a hereditary predisposition to cancer, genetic testing, and to further clarify her future cancer risks, as well as potential cancer risks for family members.   In March 2020, at the age of 4, Ms. Watson was diagnosed with invasive ductal carcinoma of the right breast. The treatment plan consists of lumpectomy and radiation. Ms. Grace Bushy mother had genetic testing in the past and was negative.     CANCER HISTORY:    Ductal carcinoma in situ (DCIS) of right breast   03/08/2018 Initial Diagnosis    Screening mammogram detected right breast 1.5 cm oval nodule UOQ/axillary tail posterior depth 9 cm from the nipple, lymph node with focal cortical thickening right axilla, biopsy revealed intermediate grade DCIS at 3 o'clock position,PASH at 10:30 position, lymph node: Benign however limited material malignancy not excluded.  TisNX stage 0     Past Medical History:  Diagnosis Date  . Anemia    TAKES IRON PILLS    Past Surgical History:  Procedure Laterality Date  . LAPAROSCOPIC ASSISTED VAGINAL HYSTERECTOMY N/A 02/27/2014   Procedure: LAPAROSCOPIC ASSISTED VAGINAL HYSTERECTOMY;  Surgeon: Maeola Sarah. Landry Mellow, MD;  Location: [facility];  Service: Gynecology;  Laterality: N/A;  abdomen to vagina to abdomen    Social History   Socioeconomic History  . Marital status: Married    Spouse name: Not on file  . Number of children: Not on file   . Years of education: Not on file  . Highest education level: Not on file  Occupational History  . Not on file  Social Needs  . Financial resource strain: Not on file  . Food insecurity:    Worry: Not on file    Inability: Not on file  . Transportation needs:    Medical: Not on file    Non-medical: Not on file  Tobacco Use  . Smoking status: Never Smoker  . Smokeless tobacco: Never Used  Substance and Sexual Activity  . Alcohol use: Yes    Comment: rare - 1-2 drinks every few months  . Drug use: No  . Sexual activity: Not on file  Lifestyle  . Physical activity:    Days per week: Not on file    Minutes per session: Not on file  . Stress: Not on file  Relationships  . Social connections:    Talks on phone: Not on file    Gets together: Not on file    Attends religious service: Not on file    Active member of club or organization: Not on file    Attends meetings of clubs or organizations: Not on file    Relationship status: Not on file  Other Topics Concern  . Not on file  Social History Narrative  . Not on file     FAMILY HISTORY:  We obtained a detailed, 4-generation family history.  Significant diagnoses are listed below: Family History  Problem Relation Age of Onset  . Breast cancer Mother        dx. late 71s; s/p mastectomy  . Pancreatic cancer Mother 16       adenocarcinoma  . Other Sister        one sister had a hysterectomy due to heavy bleeding at the age of 89  . Heart attack Maternal Uncle 41  . Heart attack Maternal Grandmother 83  . Prostate cancer Maternal Grandfather 33       "very aggressive"  . Leukemia Other 77       maternal great aunt (MGM's sister)  . Stomach cancer Other        maternal great aunt (MGF's sister) dx. over age 33  . Heart Problems Paternal Grandmother   . Cancer Other        maternal great uncle (MGM's brother) dx. NOS cancer at later age  . Breast cancer Cousin        (2) maternal "2nd cousins" dx. with breast cancers  in their mid-50s  . Colon cancer Neg Hx     The patient has one daughter who is cancer free.  She has three sisters and a brother who do not have cancer.  Her mother is deceased and her father is living.  The patient's father is living and has not had cancer.  He has seven half brothers and a half sister, one brother died of heart disease and another may have had cancer.  The paternal grandparents are deceased from non-cancer related issues.  The patient's mother had breast cancer in her 56's and pancreatic cancer at 86.  She had 8 brothers, 2 sisters and a paternal half sister.  One brother is being followed for prostate problems.  The maternal grandfather had prostate cancer and the grandmother died of a heart attack.  The grandfather's sister had stomach cancer.  A couple of his nieces had breast cancer.  Ms. Amodei is aware of previous family history of genetic testing for hereditary cancer risks in her mother. Patient's maternal ancestors are of African American descent, and paternal ancestors are of African American descent. There is no reported Ashkenazi Jewish ancestry. There is no known consanguinity.  GENETIC COUNSELING ASSESSMENT: Ms. Ruff is a 53 y.o. female with a personal and family history of breast cancer which is somewhat suggestive of a hereditary cancer syndrome and predisposition to cancer. We, therefore, discussed and recommended the following at today's visit.   DISCUSSION: We discussed that 5 - 10% of breast cancer is hereditary, with most cases associated with BRCA mutations.  There are other genes that can be associated with hereditary breast cancer syndromes.  She had been seen in the past with her mother and her mother underwent genetic testing through [facility].  The testing was negative.  The patient will undergo genetic testing to ensure that there is no other risk, but the chances we will find something is low.  We reviewed the characteristics, features and  inheritance patterns of hereditary cancer syndromes. We also discussed genetic testing, including the appropriate family members to test, the process of testing, insurance coverage and turn-around-time for results. We discussed the implications of a negative, positive and/or variant of uncertain significant result. In order to get genetic test results in a timely manner so that Ms. Coggin can use these genetic test results for surgical decisions, we recommended Ms. Villescas pursue genetic testing for the 9-gene STAT panel. Once complete, we recommend Ms. Biskup pursue reflex genetic testing  to the common hereditary cancer gene panel. The Hereditary Gene Panel offered by [facility] includes sequencing and/or deletion duplication testing of the following 47 genes: APC, ATM, AXIN2, BARD1, BMPR1A, BRCA1, BRCA2, BRIP1, CDH1, CDK4, CDKN2A (p14ARF), CDKN2A (p16INK4a), CHEK2, CTNNA1, DICER1, EPCAM (Deletion/duplication testing only), GREM1 (promoter region deletion/duplication testing only), KIT, MEN1, MLH1, MSH2, MSH3, MSH6, MUTYH, NBN, NF1, NHTL1, PALB2, PDGFRA, PMS2, POLD1, POLE, PTEN, RAD50, RAD51C, RAD51D, SDHB, SDHC, SDHD, SMAD4, SMARCA4. STK11, TP53, TSC1, TSC2, and VHL.  The following genes were evaluated for sequence changes only: SDHA and HOXB13 c.251G>A variant only.   Based on Ms. Hann's personal and family history of cancer, she meets medical criteria for genetic testing. Despite that she meets criteria, she may still have an out of pocket cost.   PLAN: After considering the risks, benefits, and limitations, Ms. Fair provided informed consent to pursue genetic testing and the blood sample was sent to [facility] for analysis of the STAT panel and common hereditary cancer panel. Results should be available within approximately 5-10 days time, with up to an additional 1-2 weeks' time for the hereditary cancer panel, at which point they will be disclosed by telephone to Ms. Hilburn, as will  any additional recommendations warranted by these results. Ms. Smithhart will receive a summary of her genetic counseling visit and a copy of her results once available. This information will also be available in Epic.   Lastly, we encouraged Ms. Hesketh to remain in contact with cancer genetics annually so that we can continuously update the family history and inform her of any changes in cancer genetics and testing that may be of benefit for this family.   Ms. Ferre questions were answered to her satisfaction today. Our contact information was provided should additional questions or concerns arise. Thank you for the referral and allowing [place] to share in the care of your patient.   Karen P. Florene Glen, [place], [facility] Certified Genetic Counselor [email or web address] phone: [phone]  The patient was seen for a total of 20 minutes in face-to-face genetic counseling.  This patient was discussed with Drs. Magrinat, Lindi Adie and/or Burr Medico who agrees with the above.    _______________________________________________________________________ For Office Staff:  Number of people involved in session: 2 Was an Intern/ student involved with case: no

## 2018-03-22 NOTE — Progress Notes (Unsigned)
Nutrition Assessment  Reason for Assessment:  Pt seen in Breast Clinic  ASSESSMENT:   53 year old with new diagnosis of breast cancer.  Past medical history reviewed.   Patient reports normal appetite   Medications:  reviewed  Labs: reviewed  Anthropometrics:   Height: 68 inches Weight: 181 lb 8 oz BMI: 27   NUTRITION DIAGNOSIS: Food and nutrition related knowledge deficit related to new diagnosis of breast cancer as evidenced by no prior need for nutrition related information.  INTERVENTION:   Discussed and provided packet of information regarding nutritional tips for breast cancer patients.  Questions answered.  Teachback method used.  Contact information provided and patient knows to contact me with questions/concerns.    MONITORING, EVALUATION, and GOAL: Pt will consume a healthy plant based diet to maintain lean body mass throughout treatment.   Patricia Houston B. Zenia Resides, [place], [place] Registered Dietitian [phone] (pager)

## 2018-03-28 DIAGNOSIS — Z0000 Encounter for general adult medical examination without abnormal findings: Secondary | ICD-10-CM | POA: Diagnosis not present

## 2018-03-28 DIAGNOSIS — E785 Hyperlipidemia, unspecified: Secondary | ICD-10-CM | POA: Diagnosis not present

## 2018-03-28 DIAGNOSIS — D509 Iron deficiency anemia, unspecified: Secondary | ICD-10-CM | POA: Diagnosis not present

## 2018-03-28 DIAGNOSIS — E559 Vitamin D deficiency, unspecified: Secondary | ICD-10-CM | POA: Diagnosis not present

## 2018-03-28 NOTE — Progress Notes (Signed)
[place] pt for follow-up, due to not being able to connect during Breast Clinic visit on 03/22/2018. Left voicemail message with brief information about Patient and [facility] and phone number to contact if any needs arise. Was also sent resource packet detailing support programs.  Doris Cheadle, Counseling Intern [phone]

## 2018-03-31 ENCOUNTER — Telehealth: Payer: Self-pay | Admitting: *Deleted

## 2018-03-31 ENCOUNTER — Telehealth: Payer: Self-pay | Admitting: Radiation Oncology

## 2018-03-31 ENCOUNTER — Telehealth: Payer: Self-pay | Admitting: Genetic Counselor

## 2018-03-31 ENCOUNTER — Ambulatory Visit: Payer: Self-pay | Admitting: Genetic Counselor

## 2018-03-31 ENCOUNTER — Other Ambulatory Visit: Payer: Self-pay | Admitting: Surgery

## 2018-03-31 DIAGNOSIS — D0511 Intraductal carcinoma in situ of right breast: Secondary | ICD-10-CM

## 2018-03-31 DIAGNOSIS — Z1379 Encounter for other screening for genetic and chromosomal anomalies: Secondary | ICD-10-CM | POA: Insufficient documentation

## 2018-03-31 NOTE — Progress Notes (Signed)
HPI:  Ms. Henriquez was previously seen in the [facility] due to a family history of cancer and concerns regarding a hereditary predisposition to cancer. Please refer to our prior cancer genetics clinic note for more information regarding our discussion, assessment and recommendations, at the time. Ms. Mikkelsen recent genetic test results were disclosed to her, as were recommendations warranted by these results. These results and recommendations are discussed in more detail below.  CANCER HISTORY:    Ductal carcinoma in situ (DCIS) of right breast   03/08/2018 Initial Diagnosis    Screening mammogram detected right breast 1.5 cm oval nodule UOQ/axillary tail posterior depth 9 cm from the nipple, lymph node with focal cortical thickening right axilla, biopsy revealed intermediate grade DCIS at 3 o'clock position,PASH at 10:30 position, lymph node: Benign however limited material malignancy not excluded.  TisNX stage 0    03/31/2018 Genetic Testing    Negative genetic testing on the multicancer panel.  The Multi-Gene Panel offered by [facility] includes sequencing and/or deletion duplication testing of the following 85 genes: AIP, ALK, APC, ATM, AXIN2,BAP1,  BARD1, BLM, BMPR1A, BRCA1, BRCA2, BRIP1, CASR, CDC73, CDH1, CDK4, CDKN1B, CDKN1C, CDKN2A (p14ARF), CDKN2A (p16INK4a), CEBPA, CHEK2, CTNNA1, DICER1, DIS3L2, EGFR (c.2369C>T, p.Thr790Met variant only), EPCAM (Deletion/duplication testing only), FH, FLCN, GATA2, GPC3, GREM1 (Promoter region deletion/duplication testing only), HOXB13 (c.251G>A, p.Gly84Glu), HRAS, KIT, MAX, MEN1, MET, MITF (c.952G>A, p.Glu318Lys variant only), MLH1, MSH2, MSH3, MSH6, MUTYH, NBN, NF1, NF2, NTHL1, PALB2, PDGFRA, PHOX2B, PMS2, POLD1, POLE, POT1, PRKAR1A, PTCH1, PTEN, RAD50, RAD51C, RAD51D, RB1, RECQL4, RET, RNF43, RUNX1, SDHAF2, SDHA (sequence changes only), SDHB, SDHC, SDHD, SMAD4, SMARCA4, SMARCB1, SMARCE1, STK11, SUFU, TERC, TERT, TMEM127, TP53, TSC1, TSC2,  VHL, WRN and WT1.  The report date is March 31, 2018.     FAMILY HISTORY:  We obtained a detailed, 4-generation family history.  Significant diagnoses are listed below: Family History  Problem Relation Age of Onset   Breast cancer Mother        dx. late 44s; s/p mastectomy   Pancreatic cancer Mother 15       adenocarcinoma   Other Sister        one sister had a hysterectomy due to heavy bleeding at the age of 26   Heart attack Maternal Uncle 41   Heart attack Maternal Grandmother 83   Prostate cancer Maternal Grandfather 86       "very aggressive"   Leukemia Other 11       maternal great aunt (MGM's sister)   Stomach cancer Other        maternal great aunt (MGF's sister) dx. over age 42   Heart Problems Paternal Grandmother    Cancer Other        maternal great uncle (MGM's brother) dx. NOS cancer at later age   Breast cancer Cousin        (2) maternal "2nd cousins" dx. with breast cancers in their mid-50s   Colon cancer Neg Hx     The patient has one daughter who is cancer free.  She has three sisters and a brother who do not have cancer.  Her mother is deceased and her father is living.  The patient's father is living and has not had cancer.  He has seven half brothers and a half sister, one brother died of heart disease and another may have had cancer.  The paternal grandparents are deceased from non-cancer related issues.  The patient's mother had breast cancer in her 56's and  pancreatic cancer at 69.  She had 8 brothers, 2 sisters and a paternal half sister.  One brother is being followed for prostate problems.  The maternal grandfather had prostate cancer and the grandmother died of a heart attack.  The grandfather's sister had stomach cancer.  A couple of his nieces had breast cancer.  Ms. Stantz is aware of previous family history of genetic testing for hereditary cancer risks in her mother. Patient's maternal ancestors are of African American descent, and  paternal ancestors are of African American descent. There is no reported Ashkenazi Jewish ancestry. There is no known consanguinity.   GENETIC TEST RESULTS: Genetic testing reported out on March 31, 2018 through the multi-cancer panel found no pathogenic mutations. The Multi-Gene Panel offered by [facility] includes sequencing and/or deletion duplication testing of the following 85 genes: AIP, ALK, APC, ATM, AXIN2,BAP1,  BARD1, BLM, BMPR1A, BRCA1, BRCA2, BRIP1, CASR, CDC73, CDH1, CDK4, CDKN1B, CDKN1C, CDKN2A (p14ARF), CDKN2A (p16INK4a), CEBPA, CHEK2, CTNNA1, DICER1, DIS3L2, EGFR (c.2369C>T, p.Thr790Met variant only), EPCAM (Deletion/duplication testing only), FH, FLCN, GATA2, GPC3, GREM1 (Promoter region deletion/duplication testing only), HOXB13 (c.251G>A, p.Gly84Glu), HRAS, KIT, MAX, MEN1, MET, MITF (c.952G>A, p.Glu318Lys variant only), MLH1, MSH2, MSH3, MSH6, MUTYH, NBN, NF1, NF2, NTHL1, PALB2, PDGFRA, PHOX2B, PMS2, POLD1, POLE, POT1, PRKAR1A, PTCH1, PTEN, RAD50, RAD51C, RAD51D, RB1, RECQL4, RET, RNF43, RUNX1, SDHAF2, SDHA (sequence changes only), SDHB, SDHC, SDHD, SMAD4, SMARCA4, SMARCB1, SMARCE1, STK11, SUFU, TERC, TERT, TMEM127, TP53, TSC1, TSC2, VHL, WRN and WT1.  The test report has been scanned into EPIC and is located under the Molecular Pathology section of the Results Review tab.  A portion of the result report is included below for reference.   Negative genetic testing on the multi cancer panel.  The Multi-Gene Panel offered by [facility] includes sequencing and/or deletion duplication testing of the following 85 genes: AIP, ALK, APC, ATM, AXIN2,BAP1,  BARD1, BLM, BMPR1A, BRCA1, BRCA2, BRIP1, CASR, CDC73, CDH1, CDK4, CDKN1B, CDKN1C, CDKN2A (p14ARF), CDKN2A (p16INK4a), CEBPA, CHEK2, CTNNA1, DICER1, DIS3L2, EGFR (c.2369C>T, p.Thr790Met variant only), EPCAM (Deletion/duplication testing only), FH, FLCN, GATA2, GPC3, GREM1 (Promoter region deletion/duplication testing only), HOXB13 (c.251G>A, p.Gly84Glu),  HRAS, KIT, MAX, MEN1, MET, MITF (c.952G>A, p.Glu318Lys variant only), MLH1, MSH2, MSH3, MSH6, MUTYH, NBN, NF1, NF2, NTHL1, PALB2, PDGFRA, PHOX2B, PMS2, POLD1, POLE, POT1, PRKAR1A, PTCH1, PTEN, RAD50, RAD51C, RAD51D, RB1, RECQL4, RET, RNF43, RUNX1, SDHAF2, SDHA (sequence changes only), SDHB, SDHC, SDHD, SMAD4, SMARCA4, SMARCB1, SMARCE1, STK11, SUFU, TERC, TERT, TMEM127, TP53, TSC1, TSC2, VHL, WRN and WT1.  The report date is March 31, 2018.  We discussed with Ms. Thiem that because current genetic testing is not perfect, it is possible there may be a gene mutation in one of these genes that current testing cannot detect, but that chance is small.  We also discussed, that there could be another gene that has not yet been discovered, or that we have not yet tested, that is responsible for the cancer diagnoses in the family. It is also possible there is a hereditary cause for the cancer in the family that Ms. Abdella did not inherit and therefore was not identified in her testing.  Therefore, it is important to remain in touch with cancer genetics in the future so that we can continue to offer Ms. Matarazzo the most up to date genetic testing.   ADDITIONAL GENETIC TESTING: We discussed with Ms. Burrill that her genetic testing was fairly extensive.  If there are genes identified to increase cancer risk that can be analyzed in the future, we would  be happy to discuss and coordinate this testing at that time.    CANCER SCREENING RECOMMENDATIONS: Ms. Keltz test result is considered negative (normal).  This means that we have not identified a hereditary cause for her family history of cancer at this time. Most cancers happen by chance and this negative test suggests that her cancer may fall into this category.    While reassuring, this does not definitively rule out a hereditary predisposition to cancer. It is still possible that there could be genetic mutations that are undetectable by current technology.  There could be genetic mutations in genes that have not been tested or identified to increase cancer risk.  Therefore, it is recommended she continue to follow the cancer management and screening guidelines provided by her oncology and primary healthcare provider.   An individual's cancer risk and medical management are not determined by genetic test results alone. Overall cancer risk assessment incorporates additional factors, including personal medical history, family history, and any available genetic information that may result in a personalized plan for cancer prevention and surveillance  RECOMMENDATIONS FOR FAMILY MEMBERS:  Individuals in this family might be at some increased risk of developing cancer, over the general population risk, simply due to the family history of cancer.  We recommended women in this family have a yearly mammogram beginning at age 1, or 20 years younger than the earliest onset of cancer, an annual clinical breast exam, and perform monthly breast self-exams. Women in this family should also have a gynecological exam as recommended by their primary provider. All family members should have a colonoscopy by age 45.  FOLLOW-UP: Lastly, we discussed with Ms. Rickett that cancer genetics is a rapidly advancing field and it is possible that new genetic tests will be appropriate for her and/or her family members in the future. We encouraged her to remain in contact with cancer genetics on an annual basis so we can update her personal and family histories and let her know of advances in cancer genetics that may benefit this family.   Our contact number was provided. Ms. Poynor questions were answered to her satisfaction, and she knows she is welcome to call us at anytime with additional questions or concerns.   Roma Kayser, MS, [facility] Certified Genetic Counselor [email or web address]

## 2018-03-31 NOTE — Telephone Encounter (Signed)
LM on VM that results are back and to please CB. 

## 2018-03-31 NOTE — Telephone Encounter (Signed)
New Message:     lft msg for patient to return call to set up a appt from referral received.

## 2018-03-31 NOTE — Telephone Encounter (Signed)
Revealed negative genetic testing.  Discussed that we do not know why she has breast cancer or why there is cancer in the family. It could be due to a different gene that we are not testing, or maybe our current technology may not be able to pick something up.  It will be important for her to keep in contact with genetics to keep up with whether additional testing may be needed. 

## 2018-03-31 NOTE — Telephone Encounter (Signed)
Left vm regarding [facility] from 3.18.20. Contact information provided for questions or needs.

## 2018-04-05 ENCOUNTER — Telehealth: Payer: Self-pay | Admitting: Radiation Oncology

## 2018-04-05 NOTE — Telephone Encounter (Signed)
New message:     lft msg for patient to return call to set up a appt from referral received.

## 2018-04-10 ENCOUNTER — Ambulatory Visit
Admission: RE | Admit: 2018-04-10 | Discharge: 2018-04-10 | Disposition: A | Discharge status: Home / Self Care | Payer: BLUE CROSS/BLUE SHIELD | Source: Ambulatory Visit | Attending: Radiation Oncology | Admitting: Radiation Oncology

## 2018-04-10 ENCOUNTER — Telehealth: Payer: Self-pay | Admitting: Radiation Oncology

## 2018-04-10 ENCOUNTER — Encounter: Payer: Self-pay | Admitting: General Practice

## 2018-04-10 NOTE — Progress Notes (Signed)
[facility] Psychosocial Distress Screening Spiritual Care  Late [facility] distress screen entry due to COVID-19 precautionary procedures. Ms Ainley has already received f/u from Support Team.  [place] 04/10/2018  Screening Type Initial Screening  Distress experienced in past week (1-10) 6  Emotional problem type Nervousness/Anxiety  Physical Problem type Sleep/insomnia  Referral to support programs Yes    [place], Maria Antonia, [facility] Pager [phone] Voicemail [phone]

## 2018-04-10 NOTE — Telephone Encounter (Signed)
New Message:    Lft vcmail for patient to call back to schedule a follow up new  appt as well as ct sim 5 wks out from her surgery on 4/16

## 2018-04-11 ENCOUNTER — Other Ambulatory Visit: Payer: Self-pay

## 2018-04-11 ENCOUNTER — Encounter (HOSPITAL_BASED_OUTPATIENT_CLINIC_OR_DEPARTMENT_OTHER): Payer: Self-pay | Admitting: *Deleted

## 2018-04-24 ENCOUNTER — Encounter: Payer: Self-pay | Admitting: *Deleted

## 2018-04-27 ENCOUNTER — Encounter: Payer: Self-pay | Admitting: *Deleted

## 2018-05-11 ENCOUNTER — Ambulatory Visit: Payer: Self-pay | Admitting: Surgery

## 2018-05-11 DIAGNOSIS — D0511 Intraductal carcinoma in situ of right breast: Secondary | ICD-10-CM

## 2018-05-26 ENCOUNTER — Institutional Professional Consult (permissible substitution): Payer: BLUE CROSS/BLUE SHIELD | Admitting: Radiation Oncology

## 2018-05-26 ENCOUNTER — Ambulatory Visit: Payer: BC Managed Care – PPO | Admitting: Radiation Oncology

## 2018-06-06 DIAGNOSIS — Z03818 Encounter for observation for suspected exposure to other biological agents ruled out: Secondary | ICD-10-CM | POA: Diagnosis not present

## 2018-06-06 DIAGNOSIS — R509 Fever, unspecified: Secondary | ICD-10-CM | POA: Diagnosis not present

## 2018-06-07 ENCOUNTER — Encounter (HOSPITAL_BASED_OUTPATIENT_CLINIC_OR_DEPARTMENT_OTHER): Payer: Self-pay | Admitting: *Deleted

## 2018-06-07 ENCOUNTER — Other Ambulatory Visit: Payer: Self-pay

## 2018-06-12 ENCOUNTER — Other Ambulatory Visit: Payer: Self-pay

## 2018-06-12 ENCOUNTER — Encounter (HOSPITAL_BASED_OUTPATIENT_CLINIC_OR_DEPARTMENT_OTHER)
Admission: RE | Admit: 2018-06-12 | Discharge: 2018-06-12 | Disposition: A | Discharge status: Home / Self Care | Payer: BC Managed Care – PPO | Source: Ambulatory Visit | Attending: Surgery | Admitting: Surgery

## 2018-06-12 ENCOUNTER — Other Ambulatory Visit (HOSPITAL_COMMUNITY)
Admission: RE | Admit: 2018-06-12 | Discharge: 2018-06-12 | Disposition: A | Discharge status: Home / Self Care | Payer: BC Managed Care – PPO | Source: Ambulatory Visit | Attending: Surgery | Admitting: Surgery

## 2018-06-12 DIAGNOSIS — Z01812 Encounter for preprocedural laboratory examination: Secondary | ICD-10-CM | POA: Insufficient documentation

## 2018-06-12 DIAGNOSIS — D0511 Intraductal carcinoma in situ of right breast: Secondary | ICD-10-CM | POA: Insufficient documentation

## 2018-06-12 DIAGNOSIS — Z1159 Encounter for screening for other viral diseases: Secondary | ICD-10-CM | POA: Diagnosis not present

## 2018-06-12 LAB — COMPREHENSIVE METABOLIC PANEL
ALT: 15 U/L (ref 0–44)
AST: 16 U/L (ref 15–41)
Albumin: 4.1 g/dL (ref 3.5–5.0)
Alkaline Phosphatase: 67 U/L (ref 38–126)
Anion gap: 11 (ref 5–15)
BUN: 12 mg/dL (ref 6–20)
CO2: 21 mmol/L — ABNORMAL LOW (ref 22–32)
Calcium: 9.8 mg/dL (ref 8.9–10.3)
Chloride: 104 mmol/L (ref 98–111)
Creatinine, Ser: 0.9 mg/dL (ref 0.44–1.00)
GFR calc Af Amer: >60 mL/min (ref >60)
GFR calc non Af Amer: >60 mL/min (ref >60)
Glucose, Bld: 92 mg/dL (ref 70–99)
Potassium: 4.3 mmol/L (ref 3.5–5.1)
Sodium: 136 mmol/L (ref 135–145)
Total Bilirubin: 0.8 mg/dL (ref 0.3–1.2)
Total Protein: 7.5 g/dL (ref 6.5–8.1)

## 2018-06-12 LAB — CBC WITH DIFFERENTIAL/PLATELET
Abs Immature Granulocytes: 0.01 10*3/uL (ref 0.00–0.07)
Basophils Absolute: 0.1 10*3/uL (ref 0.0–0.1)
Basophils Relative: 1 %
Eosinophils Absolute: 0.2 10*3/uL (ref 0.0–0.5)
Eosinophils Relative: 3 %
HCT: 43.8 % (ref 36.0–46.0)
Hemoglobin: 14 g/dL (ref 12.0–15.0)
Immature Granulocytes: 0 %
Lymphocytes Relative: 37 %
Lymphs Abs: 1.9 10*3/uL (ref 0.7–4.0)
MCH: 28 pg (ref 26.0–34.0)
MCHC: 32 g/dL (ref 30.0–36.0)
MCV: 87.6 fL (ref 80.0–100.0)
Monocytes Absolute: 0.3 10*3/uL (ref 0.1–1.0)
Monocytes Relative: 6 %
Neutro Abs: 2.7 10*3/uL (ref 1.7–7.7)
Neutrophils Relative %: 53 %
Platelets: 241 10*3/uL (ref 150–400)
RBC: 5 MIL/uL (ref 3.87–5.11)
RDW: 12.9 % (ref 11.5–15.5)
WBC: 5.2 10*3/uL (ref 4.0–10.5)
nRBC: 0 % (ref 0.0–0.2)

## 2018-06-12 LAB — SARS CORONAVIRUS 2 BY RT PCR (HOSPITAL ORDER, PERFORMED IN ~~LOC~~ HOSPITAL LAB): SARS Coronavirus 2: NEGATIVE

## 2018-06-12 NOTE — Progress Notes (Signed)
Ensure pre surgery drink given with instructions to complete by [facility], surgical soap given with instructions, pt verbalized understanding.

## 2018-06-13 DIAGNOSIS — C50411 Malignant neoplasm of upper-outer quadrant of right female breast: Secondary | ICD-10-CM | POA: Diagnosis not present

## 2018-06-13 DIAGNOSIS — Z0000 Encounter for general adult medical examination without abnormal findings: Secondary | ICD-10-CM | POA: Diagnosis not present

## 2018-06-13 DIAGNOSIS — R599 Enlarged lymph nodes, unspecified: Secondary | ICD-10-CM | POA: Diagnosis not present

## 2018-06-13 DIAGNOSIS — C50311 Malignant neoplasm of lower-inner quadrant of right female breast: Secondary | ICD-10-CM | POA: Diagnosis not present

## 2018-06-13 NOTE — Anesthesia Preprocedure Evaluation (Addendum)
Anesthesia Evaluation  Patient identified by MRN, date of birth, ID band Patient awake    Reviewed: Allergy & Precautions, NPO status , Patient's Chart, lab work & pertinent test results  History of Anesthesia Complications Negative for: history of anesthetic complications  Airway Mallampati: II  TM Distance: >3 FB Neck ROM: Full    Dental no notable dental hx. (+) Dental Advisory Given   Pulmonary neg pulmonary ROS,    Pulmonary exam normal        Cardiovascular negative cardio ROS Normal cardiovascular exam     Neuro/Psych PSYCHIATRIC DISORDERS Anxiety Depression negative neurological ROS     GI/Hepatic negative GI ROS, Neg liver ROS,   Endo/Other  negative endocrine ROS  Renal/GU negative Renal ROS     Musculoskeletal negative musculoskeletal ROS (+)   Abdominal   Peds  Hematology negative hematology ROS (+)   Anesthesia Other Findings Day of surgery medications reviewed with the patient.  Reproductive/Obstetrics                            Anesthesia Physical Anesthesia Plan  ASA: II  Anesthesia Plan: General   Post-op Pain Management:  Regional for Post-op pain   Induction: Intravenous  PONV Risk Score and Plan: 4 or greater and Ondansetron, Dexamethasone, Scopolamine patch - Pre-op and Diphenhydramine  Airway Management Planned: LMA  Additional Equipment:   Intra-op Plan:   Post-operative Plan: Extubation in OR  Informed Consent: I have reviewed the patients History and Physical, chart, labs and discussed the procedure including the risks, benefits and alternatives for the proposed anesthesia with the patient or authorized representative who has indicated his/her understanding and acceptance.     Dental advisory given  Plan Discussed with: CRNA and Anesthesiologist  Anesthesia Plan Comments:        Anesthesia Quick Evaluation

## 2018-06-13 NOTE — Progress Notes (Signed)
Location of Breast Cancer: Right Breast  Histology per Pathology Report:  03/08/18 Diagnosis 1. Breast, right, needle core biopsy, 3 o'clock 1cmfn - DUCTAL CARCINOMA IN SITU - SEE COMMENT 2. Breast, right, needle core biopsy, 10:30 o'clock, 1cmfn - PSEUDOANGIOMATOUS STROMAL HYPERPLASIA - NO MALIGNANCY IDENTIFIED 3. Lymph node, needle/core biopsy, right axillary tail, 10:30 o'clock 9cmfn - LYMPHOID TISSUE WITH EPITHELIAL COMPONENT  Receptor Status: ER(POS), PR (POS)  Did patient present with symptoms or was this found on screening mammography?: It was found on a screening mammogram.   Past/Anticipated interventions by surgeon, if any: Dr. Brantley [facility] Surgery 06/14/18 Procedure: Right breast seed lumpectomy with right axillary seed localized lymph node biopsy Surgeon: Erroll Luna, MD  Past/Anticipated interventions by medical oncology, if any: 03/22/18 Dr. Lindi Adie in breast clinic Recommendation: 1. Breast conserving surgery 2. Followed by adjuvant radiation therapy 3. Followed by antiestrogen therapy with tamoxifen 5 years  Lymphedema issues, if any:  She denies. She has fair arm mobility.   Pain issues, if any:  She reports soreness to her surgical site.   SAFETY ISSUES:  Prior radiation? No  Pacemaker/ICD? No  Possible current pregnancy:?  Is the patient on methotrexate? No  Current Complaints / other details:    BP 124/89 (BP Location: Left Arm, Patient Position: Sitting)   Pulse 81   Temp 98.3 F (36.8 C) (Oral)   Resp 20   Ht 5\' 6"  (1.676 m)   Wt 184 lb 6.4 oz (83.6 kg)   SpO2 100%   BMI 29.76 kg/m    Wt Readings from Last 3 Encounters:  06/20/18 184 lb 6.4 oz (83.6 kg)  06/14/18 180 lb 1.9 oz (81.7 kg)  03/22/18 181 lb 8 oz (82.3 kg)      Reverie Vaquera, Stephani Police, RN 06/13/2018,8:50 AM

## 2018-06-14 ENCOUNTER — Other Ambulatory Visit: Payer: Self-pay

## 2018-06-14 ENCOUNTER — Ambulatory Visit (HOSPITAL_BASED_OUTPATIENT_CLINIC_OR_DEPARTMENT_OTHER): Payer: BC Managed Care – PPO | Admitting: Anesthesiology

## 2018-06-14 ENCOUNTER — Encounter (HOSPITAL_BASED_OUTPATIENT_CLINIC_OR_DEPARTMENT_OTHER)
Admission: RE | Disposition: A | Discharge status: Home / Self Care | Payer: Self-pay | Source: Home / Self Care | Attending: Surgery

## 2018-06-14 ENCOUNTER — Encounter (HOSPITAL_BASED_OUTPATIENT_CLINIC_OR_DEPARTMENT_OTHER): Payer: Self-pay | Admitting: *Deleted

## 2018-06-14 ENCOUNTER — Ambulatory Visit (HOSPITAL_BASED_OUTPATIENT_CLINIC_OR_DEPARTMENT_OTHER)
Admission: RE | Admit: 2018-06-14 | Discharge: 2018-06-14 | Disposition: A | Discharge status: Home / Self Care | Payer: BC Managed Care – PPO | Attending: Surgery | Admitting: Surgery

## 2018-06-14 DIAGNOSIS — N62 Hypertrophy of breast: Secondary | ICD-10-CM | POA: Insufficient documentation

## 2018-06-14 DIAGNOSIS — D0511 Intraductal carcinoma in situ of right breast: Secondary | ICD-10-CM | POA: Diagnosis not present

## 2018-06-14 DIAGNOSIS — Z803 Family history of malignant neoplasm of breast: Secondary | ICD-10-CM | POA: Insufficient documentation

## 2018-06-14 DIAGNOSIS — D241 Benign neoplasm of right breast: Secondary | ICD-10-CM | POA: Diagnosis not present

## 2018-06-14 DIAGNOSIS — L720 Epidermal cyst: Secondary | ICD-10-CM | POA: Diagnosis not present

## 2018-06-14 DIAGNOSIS — G8918 Other acute postprocedural pain: Secondary | ICD-10-CM | POA: Diagnosis not present

## 2018-06-14 DIAGNOSIS — R590 Localized enlarged lymph nodes: Secondary | ICD-10-CM | POA: Diagnosis not present

## 2018-06-14 DIAGNOSIS — C50911 Malignant neoplasm of unspecified site of right female breast: Secondary | ICD-10-CM | POA: Diagnosis not present

## 2018-06-14 DIAGNOSIS — C50311 Malignant neoplasm of lower-inner quadrant of right female breast: Secondary | ICD-10-CM | POA: Diagnosis not present

## 2018-06-14 HISTORY — DX: Depression, unspecified: F32.A

## 2018-06-14 HISTORY — DX: Malignant (primary) neoplasm, unspecified: C80.1

## 2018-06-14 HISTORY — DX: Major depressive disorder, single episode, unspecified: F32.9

## 2018-06-14 HISTORY — DX: Anxiety disorder, unspecified: F41.9

## 2018-06-14 HISTORY — PX: BREAST LUMPECTOMY WITH RADIOACTIVE SEED LOCALIZATION: SHX6424

## 2018-06-14 SURGERY — BREAST LUMPECTOMY WITH RADIOACTIVE SEED LOCALIZATION
Anesthesia: General | Site: Breast | Laterality: Right

## 2018-06-14 MED ORDER — CHLORHEXIDINE GLUCONATE CLOTH 2 % EX PADS: 6.0000 | MEDICATED_PAD | Freq: Once | CUTANEOUS | Status: DC

## 2018-06-14 MED ORDER — MIDAZOLAM HCL 2 MG/2ML IJ SOLN
INTRAMUSCULAR | Status: AC
  Filled 2018-06-14: qty 2

## 2018-06-14 MED ORDER — FENTANYL CITRATE (PF) 100 MCG/2ML IJ SOLN
INTRAMUSCULAR | Status: AC
  Filled 2018-06-14: qty 2

## 2018-06-14 MED ORDER — ACETAMINOPHEN 500 MG PO TABS
1000.0000 mg | ORAL_TABLET | ORAL | Status: AC
  Administered 2018-06-14: 1000 mg via ORAL

## 2018-06-14 MED ORDER — ONDANSETRON 4 MG PO TBDP
4.0000 mg | ORAL_TABLET | Freq: Once | ORAL | Status: AC
  Administered 2018-06-14: 4 mg via ORAL

## 2018-06-14 MED ORDER — FENTANYL CITRATE (PF) 100 MCG/2ML IJ SOLN
INTRAMUSCULAR | Status: DC | PRN
  Administered 2018-06-14: 25 ug via INTRAVENOUS

## 2018-06-14 MED ORDER — CELECOXIB 200 MG PO CAPS
ORAL_CAPSULE | ORAL | Status: AC
  Filled 2018-06-14: qty 1

## 2018-06-14 MED ORDER — ONDANSETRON HCL 4 MG/2ML IJ SOLN
INTRAMUSCULAR | Status: AC
  Filled 2018-06-14: qty 2

## 2018-06-14 MED ORDER — BUPIVACAINE LIPOSOME 1.3 % IJ SUSP
INTRAMUSCULAR | Status: DC | PRN
  Administered 2018-06-14: 10 mL via PERINEURAL

## 2018-06-14 MED ORDER — ONDANSETRON 4 MG PO TBDP
ORAL_TABLET | ORAL | Status: AC
  Filled 2018-06-14: qty 1

## 2018-06-14 MED ORDER — PROPOFOL 10 MG/ML IV BOLUS
INTRAVENOUS | Status: AC
  Filled 2018-06-14: qty 20

## 2018-06-14 MED ORDER — FENTANYL CITRATE (PF) 100 MCG/2ML IJ SOLN
50.0000 ug | INTRAMUSCULAR | Status: DC | PRN
  Administered 2018-06-14: 100 ug via INTRAVENOUS

## 2018-06-14 MED ORDER — FENTANYL CITRATE (PF) 100 MCG/2ML IJ SOLN: 25.0000 ug | INTRAMUSCULAR | Status: DC | PRN

## 2018-06-14 MED ORDER — CELECOXIB 400 MG PO CAPS: 400.0000 mg | ORAL_CAPSULE | ORAL | Status: DC

## 2018-06-14 MED ORDER — ACETAMINOPHEN 500 MG PO TABS
ORAL_TABLET | ORAL | Status: AC
  Filled 2018-06-14: qty 2

## 2018-06-14 MED ORDER — SCOPOLAMINE 1 MG/3DAYS TD PT72
1.0000 | MEDICATED_PATCH | TRANSDERMAL | Status: DC
  Administered 2018-06-14: 1.5 mg via TRANSDERMAL

## 2018-06-14 MED ORDER — MIDAZOLAM HCL 2 MG/2ML IJ SOLN
1.0000 mg | INTRAMUSCULAR | Status: DC | PRN
  Administered 2018-06-14: 2 mg via INTRAVENOUS

## 2018-06-14 MED ORDER — EPHEDRINE SULFATE 50 MG/ML IJ SOLN
INTRAMUSCULAR | Status: DC | PRN
  Administered 2018-06-14: 10 mg via INTRAVENOUS

## 2018-06-14 MED ORDER — BUPIVACAINE-EPINEPHRINE (PF) 0.25% -1:200000 IJ SOLN
INTRAMUSCULAR | Status: DC | PRN
  Administered 2018-06-14: 11 mL

## 2018-06-14 MED ORDER — BUPIVACAINE-EPINEPHRINE (PF) 0.25% -1:200000 IJ SOLN
INTRAMUSCULAR | Status: AC
  Filled 2018-06-14: qty 30

## 2018-06-14 MED ORDER — GABAPENTIN 300 MG PO CAPS
300.0000 mg | ORAL_CAPSULE | ORAL | Status: AC
  Administered 2018-06-14: 300 mg via ORAL

## 2018-06-14 MED ORDER — ACETAMINOPHEN 500 MG PO TABS: 1000.0000 mg | ORAL_TABLET | ORAL | Status: DC

## 2018-06-14 MED ORDER — PHENYLEPHRINE HCL (PRESSORS) 10 MG/ML IV SOLN
INTRAVENOUS | Status: DC | PRN
  Administered 2018-06-14: 80 ug via INTRAVENOUS

## 2018-06-14 MED ORDER — ONDANSETRON HCL 4 MG/2ML IJ SOLN
INTRAMUSCULAR | Status: DC | PRN
  Administered 2018-06-14: 4 mg via INTRAVENOUS

## 2018-06-14 MED ORDER — PROMETHAZINE HCL 25 MG/ML IJ SOLN: 6.2500 mg | INTRAMUSCULAR | Status: DC | PRN

## 2018-06-14 MED ORDER — PHENYLEPHRINE 40 MCG/ML (10ML) SYRINGE FOR IV PUSH (FOR BLOOD PRESSURE SUPPORT)
PREFILLED_SYRINGE | INTRAVENOUS | Status: AC
  Filled 2018-06-14: qty 10

## 2018-06-14 MED ORDER — PROPOFOL 10 MG/ML IV BOLUS
INTRAVENOUS | Status: DC | PRN
  Administered 2018-06-14: 200 mg via INTRAVENOUS

## 2018-06-14 MED ORDER — LIDOCAINE 2% (20 MG/ML) 5 ML SYRINGE
INTRAMUSCULAR | Status: AC
  Filled 2018-06-14: qty 5

## 2018-06-14 MED ORDER — IBUPROFEN 800 MG PO TABS: 800.0000 mg | ORAL_TABLET | Freq: Three times a day (TID) | ORAL | 0 refills | Status: DC | PRN

## 2018-06-14 MED ORDER — GABAPENTIN 300 MG PO CAPS
ORAL_CAPSULE | ORAL | Status: AC
  Filled 2018-06-14: qty 1

## 2018-06-14 MED ORDER — LACTATED RINGERS IV SOLN
INTRAVENOUS | Status: DC
  Administered 2018-06-14 (×2): via INTRAVENOUS

## 2018-06-14 MED ORDER — DEXAMETHASONE SODIUM PHOSPHATE 10 MG/ML IJ SOLN
INTRAMUSCULAR | Status: AC
  Filled 2018-06-14: qty 1

## 2018-06-14 MED ORDER — CELECOXIB 200 MG PO CAPS
200.0000 mg | ORAL_CAPSULE | ORAL | Status: AC
  Administered 2018-06-14: 07:00:00 200 mg via ORAL

## 2018-06-14 MED ORDER — DEXAMETHASONE SODIUM PHOSPHATE 4 MG/ML IJ SOLN
INTRAMUSCULAR | Status: DC | PRN
  Administered 2018-06-14: 10 mg via INTRAVENOUS

## 2018-06-14 MED ORDER — CEFAZOLIN SODIUM-DEXTROSE 2-4 GM/100ML-% IV SOLN
2.0000 g | INTRAVENOUS | Status: AC
  Administered 2018-06-14: 2 g via INTRAVENOUS

## 2018-06-14 MED ORDER — SCOPOLAMINE 1 MG/3DAYS TD PT72: 1.0000 | MEDICATED_PATCH | Freq: Once | TRANSDERMAL | Status: DC | PRN

## 2018-06-14 MED ORDER — CEFAZOLIN SODIUM-DEXTROSE 2-4 GM/100ML-% IV SOLN
INTRAVENOUS | Status: AC
  Filled 2018-06-14: qty 100

## 2018-06-14 MED ORDER — BUPIVACAINE HCL (PF) 0.25 % IJ SOLN
INTRAMUSCULAR | Status: DC | PRN
  Administered 2018-06-14: 15 mL

## 2018-06-14 MED ORDER — OXYCODONE HCL 5 MG PO TABS: 5.0000 mg | ORAL_TABLET | Freq: Four times a day (QID) | ORAL | 0 refills | Status: DC | PRN

## 2018-06-14 MED ORDER — EPHEDRINE 5 MG/ML INJ
INTRAVENOUS | Status: AC
  Filled 2018-06-14: qty 10

## 2018-06-14 SURGICAL SUPPLY — 59 items
ADH SKN CLS APL DERMABOND .7 (GAUZE/BANDAGES/DRESSINGS) ×1
APL PRP STRL LF DISP 70% ISPRP (MISCELLANEOUS) ×1
APPLIER CLIP 9.375 MED OPEN (MISCELLANEOUS) ×4
APR CLP MED 9.3 20 MLT OPN (MISCELLANEOUS) ×2
BINDER BREAST LRG (GAUZE/BANDAGES/DRESSINGS) IMPLANT
BINDER BREAST MEDIUM (GAUZE/BANDAGES/DRESSINGS) IMPLANT
BINDER BREAST XLRG (GAUZE/BANDAGES/DRESSINGS) ×1 IMPLANT
BINDER BREAST XXLRG (GAUZE/BANDAGES/DRESSINGS) IMPLANT
BLADE SURG 15 STRL LF DISP TIS (BLADE) ×1 IMPLANT
BLADE SURG 15 STRL SS (BLADE) ×2
CANISTER SUC SOCK COL 7IN (MISCELLANEOUS) IMPLANT
CANISTER SUCT 1200ML W/VALVE (MISCELLANEOUS) IMPLANT
CHLORAPREP W/TINT 26 (MISCELLANEOUS) ×2 IMPLANT
CLIP APPLIE 9.375 MED OPEN (MISCELLANEOUS) IMPLANT
COVER BACK TABLE REUSABLE LG (DRAPES) ×2 IMPLANT
COVER MAYO STAND REUSABLE (DRAPES) ×2 IMPLANT
COVER PROBE W GEL 5X96 (DRAPES) ×2 IMPLANT
COVER WAND RF STERILE (DRAPES) IMPLANT
DECANTER SPIKE VIAL GLASS SM (MISCELLANEOUS) IMPLANT
DERMABOND ADVANCED (GAUZE/BANDAGES/DRESSINGS) ×1
DERMABOND ADVANCED .7 DNX12 (GAUZE/BANDAGES/DRESSINGS) ×1 IMPLANT
DRAPE LAPAROSCOPIC ABDOMINAL (DRAPES) IMPLANT
DRAPE LAPAROTOMY 100X72 PEDS (DRAPES) ×2 IMPLANT
DRAPE UTILITY XL STRL (DRAPES) ×2 IMPLANT
ELECT COATED BLADE 2.86 ST (ELECTRODE) ×2 IMPLANT
ELECT REM PT RETURN 9FT ADLT (ELECTROSURGICAL) ×2
ELECTRODE REM PT RTRN 9FT ADLT (ELECTROSURGICAL) ×1 IMPLANT
GLOVE BIO SURGEON STRL SZ7 (GLOVE) ×1 IMPLANT
GLOVE BIOGEL PI IND STRL 7.0 (GLOVE) IMPLANT
GLOVE BIOGEL PI IND STRL 7.5 (GLOVE) IMPLANT
GLOVE BIOGEL PI IND STRL 8 (GLOVE) ×1 IMPLANT
GLOVE BIOGEL PI INDICATOR 7.0 (GLOVE) ×1
GLOVE BIOGEL PI INDICATOR 7.5 (GLOVE) ×1
GLOVE BIOGEL PI INDICATOR 8 (GLOVE) ×1
GLOVE ECLIPSE 7.0 STRL STRAW (GLOVE) ×1 IMPLANT
GLOVE ECLIPSE 8.0 STRL XLNG CF (GLOVE) ×2 IMPLANT
GLOVE EXAM NITRILE MD LF STRL (GLOVE) ×1 IMPLANT
GOWN STRL REUS W/ TWL LRG LVL3 (GOWN DISPOSABLE) ×2 IMPLANT
GOWN STRL REUS W/ TWL XL LVL3 (GOWN DISPOSABLE) IMPLANT
GOWN STRL REUS W/TWL LRG LVL3 (GOWN DISPOSABLE) ×4
GOWN STRL REUS W/TWL XL LVL3 (GOWN DISPOSABLE) ×2
HEMOSTAT ARISTA ABSORB 3G PWDR (HEMOSTASIS) IMPLANT
HEMOSTAT SNOW SURGICEL 2X4 (HEMOSTASIS) IMPLANT
KIT MARKER MARGIN INK (KITS) ×3 IMPLANT
NDL HYPO 25X1 1.5 SAFETY (NEEDLE) ×1 IMPLANT
NEEDLE HYPO 25X1 1.5 SAFETY (NEEDLE) ×2 IMPLANT
NS IRRIG 1000ML POUR BTL (IV SOLUTION) ×2 IMPLANT
PACK BASIN DAY SURGERY FS (CUSTOM PROCEDURE TRAY) ×2 IMPLANT
PENCIL BUTTON HOLSTER BLD 10FT (ELECTRODE) ×2 IMPLANT
SLEEVE SCD COMPRESS KNEE MED (MISCELLANEOUS) ×2 IMPLANT
SPONGE LAP 4X18 RFD (DISPOSABLE) ×4 IMPLANT
SUT MNCRL AB 4-0 PS2 18 (SUTURE) ×2 IMPLANT
SUT SILK 2 0 SH (SUTURE) IMPLANT
SUT VICRYL 3-0 CR8 SH (SUTURE) ×2 IMPLANT
SYR CONTROL 10ML LL (SYRINGE) ×2 IMPLANT
TOWEL GREEN STERILE FF (TOWEL DISPOSABLE) ×2 IMPLANT
TRAY FAXITRON CT DISP (TRAY / TRAY PROCEDURE) ×2 IMPLANT
TUBE CONNECTING 20X1/4 (TUBING) IMPLANT
YANKAUER SUCT BULB TIP NO VENT (SUCTIONS) IMPLANT

## 2018-06-14 NOTE — Anesthesia Postprocedure Evaluation (Signed)
Anesthesia Post Note  Patient: Patricia Houston  Procedure(s) Performed: RIGHT BREAST RADIOACTIVE SEED LOCALIZATION LUMPECTOMY X2 (Right Breast)     Patient location during evaluation: PACU Anesthesia Type: General Level of consciousness: sedated Pain management: pain level controlled Vital Signs Assessment: post-procedure vital signs reviewed and stable Respiratory status: spontaneous breathing and respiratory function stable Cardiovascular status: stable Postop Assessment: no apparent nausea or vomiting Anesthetic complications: no    Last Vitals:  Vitals:   06/14/18 1030 06/14/18 1045  BP: 116/87 117/82  Pulse: 71 71  Resp: 13 14  Temp:    SpO2: 98% 98%    Last Pain:  Vitals:   06/14/18 1045  TempSrc:   PainSc: 0-No pain                 Shay Bartoli DANIEL

## 2018-06-14 NOTE — Op Note (Signed)
Preoperative diagnosis: Right breast DCIS subareolar and right axillary lymphadenopathy  Postoperative diagnosis: Same  Procedure: Right breast seed lumpectomy with right axillary seed localized lymph node biopsy  Surgeon: Erroll Luna, MD  Anesthesia: LMA with pectoral block per anesthesia and 0.25% Sensorcaine local with epinephrine  EBL: 10 cc  Specimens: Right central breast mass with additional inferior margin and right axillary lymph node intramammary  Drains: None  IV fluids: Per anesthesia record  Indications for procedure: Patient presents for right breast lumpectomy after screen detected mammographic abnormality and subsequent core biopsy showed DCIS high-grade.  She also had a lymph node in right axilla biopsied which showed lymphoid hyperplasia without obvious signs of metastatic disease.  She opted for breast conservation and lymph node biopsy.The procedure has been discussed with the patient. Alternatives to surgery have been discussed with the patient.  Risks of surgery include bleeding,  Infection,  Seroma formation, death,  and the need for further surgery.   The patient understands and wishes to proceed.Sentinel lymph node mapping and dissection has been discussed with the patient.  Risk of bleeding,  Infection,  Seroma formation,  Additional procedures,,  Shoulder weakness ,  Shoulder stiffness,  Nerve and blood vessel injury and reaction to the mapping dyes have been discussed.  Alternatives to surgery have been discussed with the patient.  The patient agrees to proceed.   Description of procedure: The patient was met in the holding area and questions were answered.  Neoprobe was used to localize the seed in the right breast and right axilla and the side was marked as the correct side.  Questions were answered.  She underwent a block per anesthesia.  She was then taken back to the operating room.  She was placed supine upon the operating room table.  After induction of  general anesthesia, right breast and right axilla were prepped and draped in a sterile fashion and timeout was performed.  She received appropriate preoperative antibiotics.  Neoprobe was used.  The central seed was subareolar.  Curvilinear incision was made along the medial border of the nipple areolar complex and dissection was carried down and all tissue around the seed and clip were excised with a grossly negative margin.  Faxitron image showed that the inferior margin was close therefore took that as an additional margin.  All specimens were oriented with ink and sent to pathology.  Hemostasis achieved.  Wound closed with 3-0 Vicryl and 4 Monocryl after placing clips to mark the cavity.  Neoprobe was used to identify the right axillary lymph node.  A seed was present.  Incision was made along the inferior hairline border of the right axilla.  Dissection was carried down in a 1 cm intramammary lymph node was identified and excised.  Faxitron image revealed both seed and clip to be in the specimen.  This was oriented and sent to pathology in a similar fashion.  The cavity was clipped and then closed with 3-0 Vicryl and 4-0 Monocryl.  Dermabond applied.  Breast binder placed.  All final counts found to be correct.  Patient was awoke extubated taken recovery in satisfactory condition.

## 2018-06-14 NOTE — Discharge Instructions (Signed)
[facility] °Office Phone Number [phone] ° °BREAST BIOPSY/ PARTIAL MASTECTOMY: POST OP INSTRUCTIONS ° °Always review your discharge instruction sheet given to you by the facility where your surgery was performed. ° °IF YOU HAVE DISABILITY OR FAMILY LEAVE FORMS, YOU MUST BRING THEM TO THE OFFICE FOR PROCESSING.  DO NOT GIVE THEM TO YOUR DOCTOR. ° °1. A prescription for pain medication may be given to you upon discharge.  Take your pain medication as prescribed, if needed.  If narcotic pain medicine is not needed, then you may take acetaminophen (Tylenol) or ibuprofen (Advil) as needed. °2. Take your usually prescribed medications unless otherwise directed °3. If you need a refill on your pain medication, please contact your pharmacy.  They will contact our office to request authorization.  Prescriptions will not be filled after 5pm or on week-ends. °4. You should eat very light the first 24 hours after surgery, such as soup, crackers, pudding, etc.  Resume your normal diet the day after surgery. °5. Most patients will experience some swelling and bruising in the breast.  Ice packs and a good support bra will help.  Swelling and bruising can take several days to resolve.  °6. It is common to experience some constipation if taking pain medication after surgery.  Increasing fluid intake and taking a stool softener will usually help or prevent this problem from occurring.  A mild laxative (Milk of Magnesia or Miralax) should be taken according to package directions if there are no bowel movements after 48 hours. °7. Unless discharge instructions indicate otherwise, you may remove your bandages 24-48 hours after surgery, and you may shower at that time.  You may have steri-strips (small skin tapes) in place directly over the incision.  These strips should be left on the skin for 7-10 days.  If your surgeon used skin glue on the incision, you may shower in 24 hours.  The glue will flake off over the  next 2-3 weeks.  Any sutures or staples will be removed at the office during your follow-up visit. °8. ACTIVITIES:  You may resume regular daily activities (gradually increasing) beginning the next day.  Wearing a good support bra or sports bra minimizes pain and swelling.  You may have sexual intercourse when it is comfortable. °a. You may drive when you no longer are taking prescription pain medication, you can comfortably wear a seatbelt, and you can safely maneuver your car and apply brakes. °b. RETURN TO WORK:  ______________________________________________________________________________________ °9. You should see your doctor in the office for a follow-up appointment approximately two weeks after your surgery.  Your doctor’s nurse will typically make your follow-up appointment when she calls you with your pathology report.  Expect your pathology report 2-3 business days after your surgery.  You may call to check if you do not hear from us after three days. °10. OTHER INSTRUCTIONS: _______________________________________________________________________________________________ _____________________________________________________________________________________________________________________________________ °_____________________________________________________________________________________________________________________________________ °_____________________________________________________________________________________________________________________________________ ° °WHEN TO CALL YOUR DOCTOR: °1. Fever over 101.0 °2. Nausea and/or vomiting. °3. Extreme swelling or bruising. °4. Continued bleeding from incision. °5. Increased pain, redness, or drainage from the incision. ° °The clinic staff is available to answer your questions during regular business hours.  Please don’t hesitate to call and ask to speak to one of the nurses for clinical concerns.  If you have a medical emergency, go to the nearest  emergency room or call 911.  A surgeon from [facility] is always on call at the hospital. ° °For further questions, please visit [email or web address]  ° ° ° ° °  Post Anesthesia Home Care Instructions ° °Activity: °Get plenty of rest for the remainder of the day. A responsible individual must stay with you for 24 hours following the procedure.  °For the next 24 hours, DO NOT: °-Drive a car °-Operate machinery °-Drink alcoholic beverages °-Take any medication unless instructed by your physician °-Make any legal decisions or sign important papers. ° °Meals: °Start with liquid foods such as gelatin or soup. Progress to regular foods as tolerated. Avoid greasy, spicy, heavy foods. If nausea and/or vomiting occur, drink only clear liquids until the nausea and/or vomiting subsides. Call your physician if vomiting continues. ° °Special Instructions/Symptoms: °Your throat may feel dry or sore from the anesthesia or the breathing tube placed in your throat during surgery. If this causes discomfort, gargle with warm salt water. The discomfort should disappear within 24 hours. ° °If you had a scopolamine patch placed behind your ear for the management of post- operative nausea and/or vomiting: ° °1. The medication in the patch is effective for 72 hours, after which it should be removed.  Wrap patch in a tissue and discard in the trash. Wash hands thoroughly with soap and water. °2. You may remove the patch earlier than 72 hours if you experience unpleasant side effects which may include dry mouth, dizziness or visual disturbances. °3. Avoid touching the patch. Wash your hands with soap and water after contact with the patch. °  ° °

## 2018-06-14 NOTE — H&P (Signed)
Patricia Houston  Location: [facility] Patient #: [id number] DOB: 13-May-1965 Undefined / Language: Suszanne Conners / Race: White Female  History of Present Illness  Patient words: Pt sent at the request of Dr Huel Cote for SDM abnormality right breast 0.9 cm 3 oclock DCIS  mass in axilla felt to be lymphoid in nature but excision recommended  no hx of mass pain or discharge  family hx of breast cancer                   Diagnosis 1. Breast, right, needle core biopsy, 3 o'clock 1cmfn - DUCTAL CARCINOMA IN SITU - SEE COMMENT 2. Breast, right, needle core biopsy, 10:30 o'clock, 1cmfn - PSEUDOANGIOMATOUS STROMAL HYPERPLASIA - NO MALIGNANCY IDENTIFIED 3. Lymph node, needle/core biopsy, right axillary tail, 10:30 o'clock 9cmfn - LYMPHOID TISSUE WITH EPITHELIAL COMPONENT - SEE COMMENT Microscopic Comment 1. Immunohistochemistry was performed on the biopsy to assess for an invasive component (SMM, calponin, and p63). Myoepithelial cells are present supporting the diagnosis of ductal carcinoma in situ. Based on the biopsy, the ductal carcinoma in situ has a papillary pattern, intermediate nuclear grade and measures 0.6 cm in greatest linear extent. Prognostic markers (ER/PR) are pending and will be reported in an addendum. Dr. Jeannie Done has reviewed the case and agrees with the diagnosis. The results were called to [facility] on March 10, 2018. 3. CD68 highlights increased histiocytes. Cytokeratin AE1/3 highlights an epithelial component which histologically looks benign and squamoid. The overall features are consistent with a ruptured cyst; however, malignancy cannot be excluded on limited biopsy material. Thressa Sheller MD Pathologist, Electronic Signature (Case signed 03/10/2018) Specimen.  The patient is a 53 year old female.   Medication History  Medications Reconciled     Review of Systems All other systems  negative   Physical Exam   General Mental Status-Alert. General Appearance-Consistent with stated age. Hydration-Well hydrated. Voice-Normal.  Head and Neck Head-normocephalic, atraumatic with no lesions or palpable masses. Trachea-midline. Thyroid Gland Characteristics - normal size and consistency.  Chest and Lung Exam Chest and lung exam reveals -quiet, even and easy respiratory effort with no use of accessory muscles and on auscultation, normal breath sounds, no adventitious sounds and normal vocal resonance. Inspection Chest Wall - Normal. Back - normal.  Breast Breast - Left-Symmetric, Non Tender, No Biopsy scars, no Dimpling, No Inflammation, No Lumpectomy scars, No Mastectomy scars, No Peau d' Orange. Breast - Right-Symmetric, Non Tender, No Biopsy scars, no Dimpling, No Inflammation, No Lumpectomy scars, No Mastectomy scars, No Peau d' Orange. Breast Lump-No Palpable Breast Mass.  Cardiovascular Cardiovascular examination reveals -normal heart sounds, regular rate and rhythm with no murmurs and normal pedal pulses bilaterally.  Neurologic Neurologic evaluation reveals -alert and oriented x 3 with no impairment of recent or remote memory. Mental Status-Normal.  Musculoskeletal Normal Exam - Left-Upper Extremity Strength Normal and Lower Extremity Strength Normal. Normal Exam - Right-Upper Extremity Strength Normal and Lower Extremity Strength Normal.  Lymphatic Head & Neck  General Head & Neck Lymphatics: Bilateral - Description - Normal. Axillary  General Axillary Region: Bilateral - Description - Normal. Tenderness - Non Tender.    Assessment & Plan   BREAST NEOPLASM, TIS (DCIS), RIGHT (D05.11) Impression: pt has opted for right breast lumpectomy  Risk of lumpectomy include bleeding, infection, seroma, more surgery, use of seed/wire, wound care, cosmetic deformity and the need for other treatments, death ,  blood clots, death. Pt agrees to proceed.   MASS OF RIGHT BREAST ON  MAMMOGRAM (N63.10) Impression: right breat lumpectomy for lymphoid tissu Risk of lumpectomy include bleeding, infection, seroma, more surgery, use of seed/wire, wound care, cosmetic deformity and the need for other treatments, death , blood clots, death. Pt agrees to proceed. e in axilla  Current Plans You are being scheduled for surgery- Our schedulers will call you.  You should hear from our office's scheduling department within 5 working days about the location, date, and time of surgery. We try to make accommodations for patient's preferences in scheduling surgery, but sometimes the OR schedule or the surgeon's schedule prevents [place] from making those accommodations.  If you have not heard from our office [phone]) in 5 working days, call the office and ask for your surgeon's nurse.  If you have other questions about your diagnosis, plan, or surgery, call the office and ask for your surgeon's nurse.  Pt Education - [facility] Breast Cancer Information Given - [facility] "Breast Journey" Package We discussed the staging and pathophysiology of breast cancer. We discussed all of the different options for treatment for breast cancer including surgery, chemotherapy, radiation therapy, Herceptin, and antiestrogen therapy. We discussed a sentinel lymph node biopsy as she does not appear to having lymph node involvement right now. We discussed the performance of that with injection of radioactive tracer and blue dye. We discussed that she would have an incision underneath her axillary hairline. We discussed that there is a bout a 10-20% chance of having a positive node with a sentinel lymph node biopsy and we will await the permanent pathology to make any other first further decisions in terms of her treatment. One of these options might be to return to the operating room to perform an axillary lymph node dissection. We discussed  about a 1-2% risk lifetime of chronic shoulder pain as well as lymphedema associated with a sentinel lymph node biopsy. We discussed the options for treatment of the breast cancer which included lumpectomy versus a mastectomy. We discussed the performance of the lumpectomy with a wire placement. We discussed a 10-20% chance of a positive margin requiring reexcision in the operating room. We also discussed that she may need radiation therapy or antiestrogen therapy or both if she undergoes lumpectomy. We discussed the mastectomy and the postoperative care for that as well. We discussed that there is no difference in her survival whether she undergoes lumpectomy with radiation therapy or antiestrogen therapy versus a mastectomy. There is a slight difference in the local recurrence rate being 3-5% with lumpectomy and about 1% with a mastectomy. We discussed the risks of operation including bleeding, infection, possible reoperation. She understands her further therapy will be based on what her stages at the time of her operation.  Pt Education - [facility] breast cancer surgery: discussed with patient and provided information.

## 2018-06-14 NOTE — Interval H&P Note (Signed)
History and Physical Interval Note:  06/14/2018 8:25 AM  Patricia Houston  has presented today for surgery, with the diagnosis of RIGHT BREAST CANCER.  The various methods of treatment have been discussed with the patient and family. After consideration of risks, benefits and other options for treatment, the patient has consented to  Procedure(s): RIGHT BREAST RADIOACTIVE SEED LOCALIZATION LUMPECTOMY X2 (Right) as a surgical intervention.  The patient's history has been reviewed, patient examined, no change in status, stable for surgery.  I have reviewed the patient's chart and labs.  Questions were answered to the patient's satisfaction.     [place]

## 2018-06-14 NOTE — Anesthesia Procedure Notes (Signed)
Anesthesia Regional Block: Pectoralis block   Pre-Anesthetic Checklist: ,, timeout performed, Correct Patient, Correct Site, Correct Laterality, Correct Procedure, Correct Position, site marked, Risks and benefits discussed,  Surgical consent,  Pre-op evaluation,  At surgeon's request and post-op pain management  Laterality: Right  Prep: chloraprep       Needles:  Injection technique: Single-shot  Needle Type: Echogenic Stimulator Needle     Needle Length: 10cm  Needle Gauge: 21     Additional Needles:   Narrative:  Start time: 06/14/2018 7:52 AM End time: 06/14/2018 8:02 AM Injection made incrementally with aspirations every 5 mL.  Performed by: Personally

## 2018-06-14 NOTE — Anesthesia Procedure Notes (Signed)
Procedure Name: LMA Insertion Date/Time: 06/14/2018 8:34 AM Performed by: Marrianne Mood, CRNA Pre-anesthesia Checklist: Patient identified, Emergency Drugs available, Suction available, Patient being monitored and Timeout performed Patient Re-evaluated:Patient Re-evaluated prior to induction Oxygen Delivery Method: Circle system utilized Preoxygenation: Pre-oxygenation with 100% oxygen Induction Type: IV induction Ventilation: Mask ventilation without difficulty LMA: LMA inserted LMA Size: 4.0 Number of attempts: 1 Airway Equipment and Method: Bite block Placement Confirmation: positive ETCO2 Tube secured with: Tape Dental Injury: Teeth and Oropharynx as per pre-operative assessment

## 2018-06-14 NOTE — Transfer of Care (Signed)
Immediate Anesthesia Transfer of Care Note  Patient: Patricia Houston  Procedure(s) Performed: RIGHT BREAST RADIOACTIVE SEED LOCALIZATION LUMPECTOMY X2 (Right Breast)  Patient Location: PACU  Anesthesia Type:GA combined with regional for post-op pain  Level of Consciousness: awake and patient cooperative  Airway & Oxygen Therapy: Patient Spontanous Breathing and Patient connected to nasal cannula oxygen  Post-op Assessment: Report given to RN and Post -op Vital signs reviewed and stable  Post vital signs: Reviewed and stable  Last Vitals:  Vitals Value Taken Time  BP 116/82 06/14/2018  9:56 AM  Temp    Pulse 75 06/14/2018  9:56 AM  Resp 15 06/14/2018  9:56 AM  SpO2 100 % 06/14/2018  9:56 AM  Vitals shown include unvalidated device data.  Last Pain:  Vitals:   06/14/18 0710  TempSrc: Oral  PainSc: 0-No pain         Complications: No apparent anesthesia complications

## 2018-06-14 NOTE — Progress Notes (Signed)
Assisted Dr. Singer with right, ultrasound guided, pectoralis block. Side rails up, monitors on throughout procedure. See vital signs in flow sheet. Tolerated Procedure well. °

## 2018-06-15 ENCOUNTER — Encounter (HOSPITAL_BASED_OUTPATIENT_CLINIC_OR_DEPARTMENT_OTHER): Payer: Self-pay | Admitting: Surgery

## 2018-06-15 ENCOUNTER — Other Ambulatory Visit (HOSPITAL_COMMUNITY): Payer: BC Managed Care – PPO

## 2018-06-20 ENCOUNTER — Ambulatory Visit
Admission: RE | Admit: 2018-06-20 | Discharge: 2018-06-20 | Disposition: A | Discharge status: Home / Self Care | Payer: BC Managed Care – PPO | Source: Ambulatory Visit | Attending: Radiation Oncology | Admitting: Radiation Oncology

## 2018-06-20 ENCOUNTER — Encounter: Payer: Self-pay | Admitting: Radiation Oncology

## 2018-06-20 ENCOUNTER — Other Ambulatory Visit: Payer: Self-pay

## 2018-06-20 VITALS — BP 124/89 | HR 81 | Temp 98.3°F | Resp 20 | Ht 66.0 in | Wt 184.4 lb

## 2018-06-20 DIAGNOSIS — Z170 Estrogen receptor positive status [ER+]: Secondary | ICD-10-CM

## 2018-06-20 DIAGNOSIS — Z803 Family history of malignant neoplasm of breast: Secondary | ICD-10-CM | POA: Diagnosis not present

## 2018-06-20 DIAGNOSIS — D241 Benign neoplasm of right breast: Secondary | ICD-10-CM | POA: Diagnosis not present

## 2018-06-20 DIAGNOSIS — Z9889 Other specified postprocedural states: Secondary | ICD-10-CM | POA: Diagnosis not present

## 2018-06-20 DIAGNOSIS — N6081 Other benign mammary dysplasias of right breast: Secondary | ICD-10-CM | POA: Diagnosis not present

## 2018-06-20 DIAGNOSIS — C50811 Malignant neoplasm of overlapping sites of right female breast: Secondary | ICD-10-CM

## 2018-06-20 NOTE — Progress Notes (Signed)
Radiation Oncology         [phone]) [phone] ________________________________  Name: Patricia Houston MRN: [id number]  Date: 06/20/2018  DOB: 1965-06-03  Follow-Up Visit Note  Outpatient  CC: System, Pcp Not In  Nicholas Lose, MD  Marilynne Drivers   Diagnosis:   Usual ductal hyperplasia arising from intraductal papilloma of the right breast    ICD-10-CM   1. Papilloma of right breast  D24.1     CHIEF COMPLAINT: Here to discuss management of right breast  Narrative:  The patient returns today for follow-up.   On 06/14/2018 Dr. Brantley Stage performed a lumpectomy.  Preliminary path, based on my conversation with Dr. Melina Copa, shows benign tissue.  It is felt that her initial biopsy was not actually DCIS.  It appears that she had an intraductal papilloma with usual ductal hyperplasia  After my visit with the patient the final report came out revealing  Diagnosis 1. Breast, lumpectomy, Right Subareolar w/seed - INTRADUCTAL PAPILLOMA WITH FLORID USUAL DUCTAL HYPERPLASIA. - BIOPSY SITE. - SEE COMMENT. 2. Breast, excision, Right Inferior Margin - INTRADUCTAL PAPILLOMA WITH USUAL DUCTAL HYPERPLASIA. 3. Breast, lumpectomy, Right Axillary w/seed - BENIGN LYMPH NODE WITH EPIDERMAL INCLUSION CYST. - NO MALIGNANCY IDENTIFIED. Microscopic Comment 1. Cytokeratin 5/6 is positive in the lesion consistent with usual ductal hyperplasia. There is no evidence of ductal carcinoma in situ. Dr.Butler has reviewed the case. Vicente Males MD   Symptomatically, the patient reports: She is healing from surgery        ALLERGIES:  has No Known Allergies.  Meds: Current Outpatient Medications  Medication Sig Dispense Refill  . Fe Cbn-Fe Gluc-FA-B12-C-DSS (FERRALET 90) 90-1 MG TABS Take 1 tablet by mouth 2 (two) times daily.    Marland Kitchen ibuprofen (ADVIL) 800 MG tablet Take 1 tablet (800 mg total) by mouth every 8 (eight) hours as needed. 30 tablet 0  . loratadine (CLARITIN) 10 MG tablet Take 10 mg by mouth daily as  needed for allergies.    Marland Kitchen oxyCODONE (OXY IR/ROXICODONE) 5 MG immediate release tablet Take 1 tablet (5 mg total) by mouth every 6 (six) hours as needed for severe pain. 15 tablet 0   No current facility-administered medications for this encounter.     Physical Findings:  vitals were not taken for this visit. .     General: Alert and oriented, in no acute distress Psychiatric: Judgment and insight are intact. Affect is appropriate.  Lab Findings: Lab Results  Component Value Date   WBC 5.2 06/12/2018   HGB 14.0 06/12/2018   HCT 43.8 06/12/2018   MCV 87.6 06/12/2018   PLT 241 06/12/2018      Radiographic Findings: No results found.  Impression/Plan: Upon extensive discussion with pathology the patient is felt to have not had any form of carcinoma in her breast.  Her initial biopsy in retrospect is felt not to have contained DCIS and that report was amended.  I called the patient 3 days after my follow-up with her to discuss the final pathology report results.  Due to a significant family history and the recent loss of her mother to breast cancer, the patient has been under significant stress.  It is hard for her to move forward with confidence given that for the past 3 months she believed she had DCIS and was going to need adjuvant therapy  She expressed that it would be helpful to speak with the pathologist about her results.  I told her I would reach out to the pathologists to  arrange this.  She also requests a follow-up with Dr. Brantley Stage for postoperative evaluation and further discussion.  I told her I would let him know of her preference for this as well.  Clearly she will not benefit from radiotherapy and we will not pursue that.  She knows to continue annual mammograms.  She understands that although she has a significant family history it is reassuring that she had negative genetic testing.  We talked about ways to mitigate her risk including exercise, diet, avoiding hormone  supplements, and minimizing alcohol consumption.  I encouraged her to reach out to me if she has any needs or concerns in the future.   Note sent to Dr Brantley Stage, Dr Lindi Adie, Dr Tresa Moore, and Dr Melina Copa: Hi all, I spoke w/ Evelene Roussin DOB [birth year] about her lumpectomy results - that she did not have DCIS.  Given a significant family history and the loss of her mother recently to breast cancer, she has significant anxiety and is having some trouble moving forward with confidence in the results.  She would really love to speak with Dr Melina Copa or Dr Tresa Moore sometime soon. Dawn or Gregary Signs, can one of you call her to explain to her what you explained to me?  I did my best, but coming from you, it will probably be even more reassuring!  Tom, she would also like to see you for her post op f/u to discuss the path results.  Thank you! Quinterrius Errington  _____________________________________   Eppie Gibson, MD  This document serves as a record of services personally performed by Eppie Gibson, MD. It was created on her behalf by Rae Lips, a trained medical scribe. The creation of this record is based on the scribe's personal observations and the provider's statements to them. This document has been checked and approved by the attending provider.

## 2018-06-23 ENCOUNTER — Encounter: Payer: Self-pay | Admitting: Radiation Oncology

## 2018-06-30 ENCOUNTER — Ambulatory Visit
Admission: RE | Admit: 2018-06-30 | Discharge: 2018-06-30 | Disposition: A | Discharge status: Home / Self Care | Payer: BC Managed Care – PPO | Source: Ambulatory Visit | Attending: Radiation Oncology | Admitting: Radiation Oncology

## 2018-06-30 ENCOUNTER — Ambulatory Visit: Payer: BC Managed Care – PPO | Admitting: Radiation Oncology

## 2018-07-03 ENCOUNTER — Encounter: Payer: Self-pay | Admitting: *Deleted

## 2018-07-14 ENCOUNTER — Telehealth: Payer: Self-pay | Admitting: Hematology and Oncology

## 2018-07-14 NOTE — Telephone Encounter (Signed)
Scheduled appt per 7/10 sch message - unable to reach pt - left message with appt date and time

## 2018-07-17 NOTE — Progress Notes (Signed)
Patient Care Team: Lois Huxley, PA as PCP - General (Family Medicine) Mauro Kaufmann, RN as Oncology Nurse Navigator Rockwell Germany, RN as Oncology Nurse Navigator Erroll Luna, MD as Consulting Physician (General Surgery) Nicholas Lose, MD as Consulting Physician (Hematology and Oncology) Eppie Gibson, MD as Attending Physician (Radiation Oncology)  DIAGNOSIS:    ICD-10-CM   1. Atypical ductal hyperplasia of right breast  N60.91     CHIEF COMPLIANT: Follow-up s/p lumpectomy to review pathology  INTERVAL HISTORY: Patricia Houston is a 53 y.o. with above-mentioned history of right breast cancer. Genetic testing was negative. She underwent a lumpectomy on 06/14/18 with Dr. Brantley Stage for which pathology revealed intraductal papilloma with usual ductal hyperplasia, with one benign lymph node. She presents to the clinic today to discuss the pathology report and further treatment.   REVIEW OF SYSTEMS:   Constitutional: Denies fevers, chills or abnormal weight loss Eyes: Denies blurriness of vision Ears, nose, mouth, throat, and face: Denies mucositis or sore throat Respiratory: Denies cough, dyspnea or wheezes Cardiovascular: Denies palpitation, chest discomfort Gastrointestinal: Denies nausea, heartburn or change in bowel habits Skin: Denies abnormal skin rashes Lymphatics: Denies new lymphadenopathy or easy bruising Neurological: Denies numbness, tingling or new weaknesses Behavioral/Psych: Mood is stable, no new changes  Extremities: No lower extremity edema Breast: denies any pain or lumps or nodules in either breasts All other systems were reviewed with the patient and are negative.  I have reviewed the past medical history, past surgical history, social history and family history with the patient and they are unchanged from previous note.  ALLERGIES:  has No Known Allergies.  MEDICATIONS:  Current Outpatient Medications  Medication Sig Dispense Refill  . Fe Cbn-Fe  Gluc-FA-B12-C-DSS (FERRALET 90) 90-1 MG TABS Take 1 tablet by mouth 2 (two) times daily.    Marland Kitchen ibuprofen (ADVIL) 800 MG tablet Take 1 tablet (800 mg total) by mouth every 8 (eight) hours as needed. 30 tablet 0  . loratadine (CLARITIN) 10 MG tablet Take 10 mg by mouth daily as needed for allergies.    Marland Kitchen oxyCODONE (OXY IR/ROXICODONE) 5 MG immediate release tablet Take 1 tablet (5 mg total) by mouth every 6 (six) hours as needed for severe pain. 15 tablet 0   No current facility-administered medications for this visit.     PHYSICAL EXAMINATION: ECOG PERFORMANCE STATUS: 1 - Symptomatic but completely ambulatory  There were no vitals filed for this visit. There were no vitals filed for this visit.  GENERAL: alert, no distress and comfortable SKIN: skin color, texture, turgor are normal, no rashes or significant lesions EYES: normal, Conjunctiva are pink and non-injected, sclera clear OROPHARYNX: no exudate, no erythema and lips, buccal mucosa, and tongue normal  NECK: supple, thyroid normal size, non-tender, without nodularity LYMPH: no palpable lymphadenopathy in the cervical, axillary or inguinal LUNGS: clear to auscultation and percussion with normal breathing effort HEART: regular rate & rhythm and no murmurs and no lower extremity edema ABDOMEN: abdomen soft, non-tender and normal bowel sounds MUSCULOSKELETAL: no cyanosis of digits and no clubbing  NEURO: alert & oriented x 3 with fluent speech, no focal motor/sensory deficits EXTREMITIES: No lower extremity edema  LABORATORY DATA:  I have reviewed the data as listed CMP Latest Ref Rng & Units 06/12/2018 03/22/2018  Glucose 70 - 99 mg/dL 92 110(H)  BUN 6 - 20 mg/dL 12 9  Creatinine 0.44 - 1.00 mg/dL 0.90 0.89  Sodium 135 - 145 mmol/L 136 139  Potassium 3.5 -  5.1 mmol/L 4.3 4.0  Chloride 98 - 111 mmol/L 104 104  CO2 22 - 32 mmol/L 21(L) 27  Calcium 8.9 - 10.3 mg/dL 9.8 9.4  Total Protein 6.5 - 8.1 g/dL 7.5 7.4  Total Bilirubin 0.3 -  1.2 mg/dL 0.8 0.4  Alkaline Phos 38 - 126 U/L 67 94  AST 15 - 41 U/L 16 13(L)  ALT 0 - 44 U/L 15 13    Lab Results  Component Value Date   WBC 5.2 06/12/2018   HGB 14.0 06/12/2018   HCT 43.8 06/12/2018   MCV 87.6 06/12/2018   PLT 241 06/12/2018   NEUTROABS 2.7 06/12/2018    ASSESSMENT & PLAN:  Atypical ductal hyperplasia of right breast Florid ductal hyperplasia involving a papillary lesion, PASH Status post lumpectomy: 06/14/2018:intraductal papilloma with usual ductal hyperplasia, with one benign lymph node The pathology report on the biopsy has been edited after the final surgery to call at florid ductal hyperplasia and not DCIS.  Pathology review: I discussed the difference between atypical ductal hyperplasia, DCIS and invasive breast cancer. I explained to her that atypical ductal hyperplasia is characterized by a proliferation of uniform epithelial cells filling part, but not the entirety, of the involved duct. Ductal hyperplasia is associated with an increased risk of both ipsilateral and contralateral breast cancer and thus provides evidence of underlying breast abnormalities that predispose to breast cancer.   Prognosis:Using the American Cancer Society breast cancer risk assessment tool, her risk of breast cancer in 5 years is at 3.6% ( average woman's risk is 1.2%), her lifetime risk of breast cancers at 24.4% ( average risk is a 8.9%)  I discussed the risks and benefits of tamoxifen therapy. I believe that the risks far outweigh any benefits from tamoxifen. Instead I recommended the following 1. Exercise 30 minutes daily 2. Weight loss 3. Increasing fruits and vegetables and less red meat I believe all of the above would extensively decrease her risk of breast cancer as well as other cancers including cancers of the colon substantially.  Surveillance plan: Annual mammograms and breast exams  Given the lifetime risk of greater than 20%, I also recommended breast MRIs to  be done once every few years.  We will obtain the first breast MRI in August 2021.  She will get a mammogram February 2021. We will see her back in August 2021 for follow-up. Because this is ductal hyperplasia and not DCIS she does not need radiation or antiestrogen therapy.  I discussed risk reduction with tamoxifen but because her five-year risk is only 3.6% I did not recommend it. Patient was happy with that decision. She is willing to do close surveillance and if there was any recurrence of ductal hyperplasia she is willing to consider it.  No orders of the defined types were placed in this encounter.  The patient has a good understanding of the overall plan. she agrees with it. she will call with any problems that may develop before the next visit here.  Nicholas Lose, MD 07/18/2018  Julious Oka Dorshimer am acting as scribe for Dr. Nicholas Lose.  I have reviewed the above documentation for accuracy and completeness, and I agree with the above.

## 2018-07-18 ENCOUNTER — Other Ambulatory Visit: Payer: Self-pay

## 2018-07-18 ENCOUNTER — Inpatient Hospital Stay: Payer: Self-pay | Attending: Hematology and Oncology | Admitting: Hematology and Oncology

## 2018-07-18 DIAGNOSIS — Z1231 Encounter for screening mammogram for malignant neoplasm of breast: Secondary | ICD-10-CM

## 2018-07-18 DIAGNOSIS — Z791 Long term (current) use of non-steroidal anti-inflammatories (NSAID): Secondary | ICD-10-CM

## 2018-07-18 DIAGNOSIS — N6091 Unspecified benign mammary dysplasia of right breast: Secondary | ICD-10-CM

## 2018-07-18 NOTE — Assessment & Plan Note (Addendum)
Florid ductal hyperplasia involving a papillary lesion, PASH Status post lumpectomy: 06/14/2018:intraductal papilloma with usual ductal hyperplasia, with one benign lymph node  Pathology review: I discussed the difference between atypical ductal hyperplasia, DCIS and invasive breast cancer. I explained to her that atypical ductal hyperplasia is characterized by a proliferation of uniform epithelial cells filling part, but not the entirety, of the involved duct. ADH is associated with an increased risk of both ipsilateral and contralateral breast cancer and thus provides evidence of underlying breast abnormalities that predispose to breast cancer.   Prognosis:Using the American Cancer Society breast cancer risk assessment tool, her risk of breast cancer in 5 years is at 4% ( average woman's risk is 1.3%), her lifetime risk of breast cancers at 33.5% ( average risk is a 11%)  I discussed the risks and benefits of tamoxifen therapy. I believe that the risks far outweigh any benefits from tamoxifen. Instead I recommended the following 1. Exercise 30 minutes daily 2. Weight loss 3. Increasing fruits and vegetables and less red meat I believe all of the above would extensively decrease her risk of breast cancer as well as other cancers including cancers of the colon substantially.  Surveillance plan: Annual mammograms and breast exams

## 2018-07-20 ENCOUNTER — Telehealth: Payer: Self-pay | Admitting: Hematology and Oncology

## 2018-07-20 NOTE — Telephone Encounter (Signed)
I left a message regarding schedule  

## 2018-09-20 DIAGNOSIS — Z01818 Encounter for other preprocedural examination: Secondary | ICD-10-CM | POA: Diagnosis not present

## 2018-11-13 ENCOUNTER — Encounter: Payer: Self-pay | Admitting: *Deleted

## 2019-01-02 ENCOUNTER — Encounter: Payer: Self-pay | Admitting: *Deleted

## 2019-04-18 DIAGNOSIS — R922 Inconclusive mammogram: Secondary | ICD-10-CM | POA: Diagnosis not present

## 2019-08-14 NOTE — Progress Notes (Signed)
Patient Care Team: Lois Huxley, PA as PCP - General (Family Medicine) Mauro Kaufmann, RN as Oncology Nurse Navigator Rockwell Germany, RN as Oncology Nurse Navigator Erroll Luna, MD as Consulting Physician (General Surgery) Nicholas Lose, MD as Consulting Physician (Hematology and Oncology) Eppie Gibson, MD as Attending Physician (Radiation Oncology)  DIAGNOSIS:    ICD-10-CM   1. Atypical ductal hyperplasia of right breast  N60.91     CHIEF COMPLIANT: Follow-up of high risk for breast cancer  INTERVAL HISTORY: Patricia Houston is a 54 y.o. 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 She presents to the clinic today for follow-up.   ALLERGIES:  has No Known Allergies.  MEDICATIONS:  Current Outpatient Medications  Medication Sig Dispense Refill  . Fe Cbn-Fe Gluc-FA-B12-C-DSS (FERRALET 90) 90-1 MG TABS Take 1 tablet by mouth 2 (two) times daily.    Marland Kitchen ibuprofen (ADVIL) 800 MG tablet Take 1 tablet (800 mg total) by mouth every 8 (eight) hours as needed. 30 tablet 0  . loratadine (CLARITIN) 10 MG tablet Take 10 mg by mouth daily as needed for allergies.     No current facility-administered medications for this visit.    PHYSICAL EXAMINATION: ECOG PERFORMANCE STATUS: 0 - Asymptomatic  Vitals:   08/15/19 0913 08/15/19 0940  BP:  113/80  Pulse:  83  Resp:  16  Temp:  98 F (36.7 C)  SpO2: 99% 100%   Filed Weights   08/15/19 0940  Weight: 178 lb 1.6 oz (80.8 kg)    BREAST: No palpable masses or nodules in either right or left breasts. No palpable axillary supraclavicular or infraclavicular adenopathy no breast tenderness or nipple discharge. (exam performed in the presence of a chaperone)  LABORATORY DATA:  I have reviewed the data as listed CMP Latest Ref Rng & Units 06/12/2018 03/22/2018  Glucose 70 - 99 mg/dL 92 110(H)  BUN 6 - 20 mg/dL 12 9  Creatinine 0.44 - 1.00 mg/dL 0.90 0.89  Sodium 135 -  145 mmol/L 136 139  Potassium 3.5 - 5.1 mmol/L 4.3 4.0  Chloride 98 - 111 mmol/L 104 104  CO2 22 - 32 mmol/L 21(L) 27  Calcium 8.9 - 10.3 mg/dL 9.8 9.4  Total Protein 6.5 - 8.1 g/dL 7.5 7.4  Total Bilirubin 0.3 - 1.2 mg/dL 0.8 0.4  Alkaline Phos 38 - 126 U/L 67 94  AST 15 - 41 U/L 16 13(L)  ALT 0 - 44 U/L 15 13    Lab Results  Component Value Date   WBC 5.2 06/12/2018   HGB 14.0 06/12/2018   HCT 43.8 06/12/2018   MCV 87.6 06/12/2018   PLT 241 06/12/2018   NEUTROABS 2.7 06/12/2018    ASSESSMENT & PLAN:  Atypical ductal hyperplasia of right breast Florid ductal hyperplasia involving a papillary lesion, PASH Status post lumpectomy: 06/14/2018:intraductal papilloma with usual ductal hyperplasia, with one benign lymph node The pathology report on the biopsy has been edited after the final surgery to call at florid ductal hyperplasia and not DCIS.  Decided against tamoxifen because by the algorithm her risk of recurrence was 3.6% and lifetime risk 24.4%  Breast cancer surveillance: 1.  Breast exam 08/15/2019: Benign 2. Mammograms 04/18/2019: Benign 3.  Breast MRI will be scheduled for October 2021  Return to clinic in 1 year for follow-up    No orders of the defined types were placed in this encounter.  The patient has a good understanding of the  overall plan. she agrees with it. she will call with any problems that may develop before the next visit here.  Total time spent: 20 mins including face to face time and time spent for planning, charting and coordination of care  Nicholas Lose, MD 08/15/2019  I, Cloyde Reams Dorshimer, am acting as scribe for Dr. Nicholas Lose.  I have reviewed the above documentation for accuracy and completeness, and I agree with the above.

## 2019-08-15 ENCOUNTER — Other Ambulatory Visit: Payer: Self-pay | Admitting: *Deleted

## 2019-08-15 ENCOUNTER — Telehealth: Payer: Self-pay | Admitting: Hematology and Oncology

## 2019-08-15 ENCOUNTER — Inpatient Hospital Stay: Payer: Managed Care, Other (non HMO) | Attending: Hematology and Oncology | Admitting: Hematology and Oncology

## 2019-08-15 ENCOUNTER — Other Ambulatory Visit: Payer: Self-pay

## 2019-08-15 DIAGNOSIS — N6091 Unspecified benign mammary dysplasia of right breast: Secondary | ICD-10-CM | POA: Insufficient documentation

## 2019-08-15 DIAGNOSIS — Z1231 Encounter for screening mammogram for malignant neoplasm of breast: Secondary | ICD-10-CM

## 2019-08-15 DIAGNOSIS — R590 Localized enlarged lymph nodes: Secondary | ICD-10-CM | POA: Diagnosis not present

## 2019-08-15 DIAGNOSIS — Z79899 Other long term (current) drug therapy: Secondary | ICD-10-CM | POA: Diagnosis not present

## 2019-08-15 NOTE — Telephone Encounter (Signed)
Scheduled appts per 8/11 los. Gave pt a print out of AVS.  

## 2019-08-15 NOTE — Assessment & Plan Note (Signed)
Florid ductal hyperplasia involving a papillary lesion, PASH Status post lumpectomy: 06/14/2018:intraductal papilloma with usual ductal hyperplasia, with one benign lymph node The pathology report on the biopsy has been edited after the final surgery to call at florid ductal hyperplasia and not DCIS.  Decided against tamoxifen because by the algorithm her risk of recurrence was 3.6% and lifetime risk 24.4%  Breast cancer surveillance: 1.  Breast exam 08/15/2019: Benign 2. Mammograms 3.  Breast MRI  Return to clinic in 1 year for follow-up

## 2020-03-17 ENCOUNTER — Telehealth: Payer: Self-pay | Admitting: Hematology and Oncology

## 2020-03-17 NOTE — Telephone Encounter (Signed)
Scheduled per 3/11 staff msg. Called pt and left a msg

## 2020-03-23 NOTE — Progress Notes (Signed)
Patient Care Team: Lois Huxley, PA as PCP - General (Family Medicine) Mauro Kaufmann, RN as Oncology Nurse Navigator Rockwell Germany, RN as Oncology Nurse Navigator Erroll Luna, MD as Consulting Physician (General Surgery) Nicholas Lose, MD as Consulting Physician (Hematology and Oncology) Eppie Gibson, MD as Attending Physician (Radiation Oncology)  DIAGNOSIS:    ICD-10-CM   1. Atypical ductal hyperplasia of right breast  N60.91     CHIEF COMPLIANT: Follow-up of high risk for breast cancer, right  Breast pain  INTERVAL HISTORY: Patricia Houston is a 55 y.o. 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 She presents to the clinic todayfor follow-up due to recent right breast pain.  There is a palpable nodule in the upper inner quadrant of her right breast.  The tenderness in the lower aspect of the breast going to the axilla could be related to the pectoral muscle because she was moving an office with lifting boxes. She also complains of a tender lump in the back below the scapula on the lateral aspect of the right back.  This has been on for about a week.  There is no fevers or chills.  ALLERGIES:  has No Known Allergies.  MEDICATIONS:  Current Outpatient Medications  Medication Sig Dispense Refill  . Fe Cbn-Fe Gluc-FA-B12-C-DSS (FERRALET 90) 90-1 MG TABS Take 1 tablet by mouth 2 (two) times daily.    Marland Kitchen ibuprofen (ADVIL) 800 MG tablet Take 1 tablet (800 mg total) by mouth every 8 (eight) hours as needed. 30 tablet 0  . loratadine (CLARITIN) 10 MG tablet Take 10 mg by mouth daily as needed for allergies.     No current facility-administered medications for this visit.    PHYSICAL EXAMINATION: ECOG PERFORMANCE STATUS: 1 - Symptomatic but completely ambulatory  Vitals:   03/24/20 0918  BP: 128/87  Pulse: 77  Resp: 20  Temp: (!) 97.5 F (36.4 C)  SpO2: 100%   Filed Weights   03/24/20 0918  Weight:  184 lb 1.6 oz (83.5 kg)    BREAST: No palpable masses or nodules in either right or left breasts. No palpable axillary supraclavicular or infraclavicular adenopathy no breast tenderness or nipple discharge. (exam performed in the presence of a chaperone)  LABORATORY DATA:  I have reviewed the data as listed CMP Latest Ref Rng & Units 06/12/2018 03/22/2018  Glucose 70 - 99 mg/dL 92 110(H)  BUN 6 - 20 mg/dL 12 9  Creatinine 0.44 - 1.00 mg/dL 0.90 0.89  Sodium 135 - 145 mmol/L 136 139  Potassium 3.5 - 5.1 mmol/L 4.3 4.0  Chloride 98 - 111 mmol/L 104 104  CO2 22 - 32 mmol/L 21(L) 27  Calcium 8.9 - 10.3 mg/dL 9.8 9.4  Total Protein 6.5 - 8.1 g/dL 7.5 7.4  Total Bilirubin 0.3 - 1.2 mg/dL 0.8 0.4  Alkaline Phos 38 - 126 U/L 67 94  AST 15 - 41 U/L 16 13(L)  ALT 0 - 44 U/L 15 13    Lab Results  Component Value Date   WBC 5.2 06/12/2018   HGB 14.0 06/12/2018   HCT 43.8 06/12/2018   MCV 87.6 06/12/2018   PLT 241 06/12/2018   NEUTROABS 2.7 06/12/2018    ASSESSMENT & PLAN:  Atypical ductal hyperplasia of right breast Florid ductal hyperplasia involving a papillary lesion,PASH Status post lumpectomy:06/14/2018:intraductal papilloma with usual ductal hyperplasia, with one benign lymph node The pathology report on the biopsy has been edited after the  final surgery to call at florid ductal hyperplasia and not DCIS.  Decided against tamoxifen because by the algorithm her risk of recurrence was 3.6% and lifetime risk 24.4%  Breast cancer surveillance: 1.  Breast exam 03/24/2020: Palpable lump in the right breast in the upper inner quadrant: I will obtain an urgent mammogram at [facility] 2. Mammograms 04/18/2019: Benign 3.  Breast MRI  will be scheduled for October 2021.  If that is benign then we will do MRIs every other year.  Possible sebaceous cyst: I sent a message to Dr. Brantley Stage to evaluate her for surgical options.  It is extremely painful and uncomfortable on her back.  Return to clinic  in 1 year for follow-up    No orders of the defined types were placed in this encounter.  The patient has a good understanding of the overall plan. she agrees with it. she will call with any problems that may develop before the next visit here.  Total time spent: 20 mins including face to face time and time spent for planning, charting and coordination of care  Rulon Eisenmenger, MD, MPH 03/24/2020  I, Molly Dorshimer, am acting as scribe for Dr. Nicholas Lose.  I have reviewed the above documentation for accuracy and completeness, and I agree with the above. [employer]

## 2020-03-24 ENCOUNTER — Telehealth: Payer: Self-pay | Admitting: Hematology and Oncology

## 2020-03-24 ENCOUNTER — Encounter: Payer: Self-pay | Admitting: *Deleted

## 2020-03-24 ENCOUNTER — Inpatient Hospital Stay: Payer: Managed Care, Other (non HMO) | Attending: Hematology and Oncology | Admitting: Hematology and Oncology

## 2020-03-24 ENCOUNTER — Other Ambulatory Visit: Payer: Self-pay

## 2020-03-24 DIAGNOSIS — R222 Localized swelling, mass and lump, trunk: Secondary | ICD-10-CM | POA: Diagnosis not present

## 2020-03-24 DIAGNOSIS — N6091 Unspecified benign mammary dysplasia of right breast: Secondary | ICD-10-CM | POA: Diagnosis present

## 2020-03-24 DIAGNOSIS — N6489 Other specified disorders of breast: Secondary | ICD-10-CM | POA: Insufficient documentation

## 2020-03-24 DIAGNOSIS — N6312 Unspecified lump in the right breast, upper inner quadrant: Secondary | ICD-10-CM | POA: Insufficient documentation

## 2020-03-24 DIAGNOSIS — Z79899 Other long term (current) drug therapy: Secondary | ICD-10-CM | POA: Diagnosis not present

## 2020-03-24 MED ORDER — VITAMIN D 25 MCG (1000 UNIT) PO TABS: 1000.0000 [IU] | ORAL_TABLET | Freq: Every day | ORAL | Status: AC

## 2020-03-24 MED ORDER — VITAMIN B-12 3000 MCG SL SUBL: 1.0000 | SUBLINGUAL_TABLET | Freq: Every day | SUBLINGUAL | Status: AC

## 2020-03-24 NOTE — Assessment & Plan Note (Signed)
Florid ductal hyperplasia involving a papillary lesion,PASH Status post lumpectomy:06/14/2018:intraductal papilloma with usual ductal hyperplasia, with one benign lymph node The pathology report on the biopsy has been edited after the final surgery to call at florid ductal hyperplasia and not DCIS.  Decided against tamoxifen because by the algorithm her risk of recurrence was 3.6% and lifetime risk 24.4%  Breast cancer surveillance: 1.  Breast exam 03/24/2020: Benign 2. Mammograms 04/18/2019: Benign 3.  Breast MRI was scheduled for October 2021: Not performed  Return to clinic in 1 year for follow-up

## 2020-03-24 NOTE — Telephone Encounter (Signed)
Scheduled appt per 3/21 los. Pt aware.  

## 2020-03-24 NOTE — Progress Notes (Signed)
Per MD request, RN successfully faxed breast mammogram order to [facility].

## 2020-04-22 ENCOUNTER — Encounter: Payer: Self-pay | Admitting: Hematology and Oncology

## 2020-08-14 ENCOUNTER — Ambulatory Visit: Payer: Managed Care, Other (non HMO) | Admitting: Hematology and Oncology

## 2020-10-27 ENCOUNTER — Other Ambulatory Visit: Payer: Self-pay

## 2020-10-27 ENCOUNTER — Ambulatory Visit: Payer: 59 | Admitting: Podiatry

## 2020-10-27 ENCOUNTER — Encounter: Payer: Self-pay | Admitting: Podiatry

## 2020-10-27 ENCOUNTER — Ambulatory Visit (INDEPENDENT_AMBULATORY_CARE_PROVIDER_SITE_OTHER): Payer: 59

## 2020-10-27 ENCOUNTER — Other Ambulatory Visit: Payer: Self-pay | Admitting: Podiatry

## 2020-10-27 DIAGNOSIS — M779 Enthesopathy, unspecified: Secondary | ICD-10-CM

## 2020-10-27 DIAGNOSIS — M21619 Bunion of unspecified foot: Secondary | ICD-10-CM | POA: Diagnosis not present

## 2020-10-27 DIAGNOSIS — M7752 Other enthesopathy of left foot: Secondary | ICD-10-CM

## 2020-10-27 MED ORDER — TRIAMCINOLONE ACETONIDE 10 MG/ML IJ SUSP
10.0000 mg | Freq: Once | INTRAMUSCULAR | Status: AC
  Administered 2020-10-27: 10 mg

## 2020-10-27 MED ORDER — DICLOFENAC SODIUM 75 MG PO TBEC: 75.0000 mg | DELAYED_RELEASE_TABLET | Freq: Two times a day (BID) | ORAL | 2 refills | Status: DC

## 2020-10-27 NOTE — Progress Notes (Signed)
Subjective:   Patient ID: Patricia Houston, female   DOB: 55 y.o.   MRN: [id number]   HPI Patient presents stating she has had a lot of pain in both her feet left over right for a number of years worsening over the last 2 years and admits that she stopped exercising.  States it is hard to describe exactly where it comes but it seems like it is her ankles her forefeet bilateral and gets worse if she stands and is become quite debilitating at times.  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      Objective:  Physical Exam Vitals and nursing note reviewed.  Constitutional:      Appearance: She is well-developed.  Pulmonary:     Effort: Pulmonary effort is normal.  Musculoskeletal:        General: Normal range of motion.  Skin:    General: Skin is warm.  Neurological:     Mental Status: She is alert.    Neurovascular status intact muscle strength adequate range of motion within normal limits with exquisite discomfort in the sinus tarsi left over right with inflammation mild to moderate discomfort in the forefoot around the metatarsal phalangeal joint bilateral no range of motion loss no crepitus noted or muscle strength loss     Assessment:  Probability for inflammatory condition with possibility for sinus tarsitis possibility for subtalar joint arthritis     Plan:  H&P reviewed all conditions and went ahead today did a sinus tarsi injection left 3 mg Kenalog 5 g liken dispensed metatarsal padding to lift of the arches and fascial brace and discussed different treatment options for her condition.  May require different alternatives and I am also sending for arthritic profile today and placed her on oral anti-inflammatory diclofenac  X-rays were negative for signs of obvious indications of subtalar joint arthritis or coalition with the possibility it could be there is  subtle way

## 2020-10-30 LAB — RHEUMATOID ARTHRITIS PROFILE
Cyclic Citrullin Peptide Ab: 6 units (ref 0–19)
Rheumatoid fact SerPl-aCnc: <10 IU/mL (ref <14.0)

## 2020-11-04 LAB — ANA: ANA Titer 1: NEGATIVE

## 2020-11-04 LAB — C-REACTIVE PROTEIN

## 2020-11-04 LAB — SEDIMENTATION RATE: Sed Rate: 26 mm/hr (ref 0–40)

## 2020-11-04 LAB — URIC ACID: Uric Acid: 5 mg/dL (ref 3.0–7.2)

## 2020-11-10 ENCOUNTER — Ambulatory Visit: Payer: 59 | Admitting: Podiatry

## 2020-11-10 ENCOUNTER — Other Ambulatory Visit: Payer: Self-pay

## 2020-11-10 ENCOUNTER — Encounter: Payer: Self-pay | Admitting: Podiatry

## 2020-11-10 DIAGNOSIS — M778 Other enthesopathies, not elsewhere classified: Secondary | ICD-10-CM

## 2020-11-10 MED ORDER — TRIAMCINOLONE ACETONIDE 10 MG/ML IJ SUSP
10.0000 mg | Freq: Once | INTRAMUSCULAR | Status: AC
  Administered 2020-11-10: 10 mg

## 2020-11-10 NOTE — Progress Notes (Signed)
Subjective:   Patient ID: Patricia Houston, female   DOB: 55 y.o.   MRN: [id number]   HPI Patient presents stating that I am improved in the ankle seems better but my forefoot is bothering me but not quite a bit better than I was in it also like to review blood work   ROS      Objective:  Physical Exam  Neurovascular status intact significant diminishment of discomfort in the sinus tarsi left with inflammation fluid mostly centered around the third metatarsal phalangeal joint of the left foot with fluid buildup within the joint with mild discomfort right foot     Assessment:  Improved sinus tarsitis with blood work that did not indicate systemic inflammatory condition with forefoot inflammation around the third MPJ left     Plan:  H&P discussed tapering off the diclofenac at this time and did a sterile prep and block of the left forefoot and then aspirated the joint getting out a small amount of clear fluid injected quarter cc dexamethasone Kenalog advised on rigid bottom shoes padding and reappoint to recheck in 6 weeks or earlier if needed

## 2021-03-27 NOTE — Progress Notes (Incomplete)
? ?Patient Care Team: ?Lois Huxley, PA as PCP - General (Family Medicine) ?Mauro Kaufmann, RN as Oncology Nurse Navigator ?Rockwell Germany, RN as Oncology Nurse Navigator ?Erroll Luna, MD as Consulting Physician (General Surgery) ?Nicholas Lose, MD as Consulting Physician (Hematology and Oncology) ?Eppie Gibson, MD as Attending Physician (Radiation Oncology) ? ?DIAGNOSIS: No diagnosis found. ? ?SUMMARY OF ONCOLOGIC HISTORY: ?Oncology History  ? No history exists.  ? ? ?CHIEF COMPLIANT: Follow-up of high risk for breast cancer, right  Breast pain ? ?INTERVAL HISTORY: Patricia Houston is a 56 y.o. 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 She presents to the clinic today for follow-up due to recent right breast pain.  ? ? ?ALLERGIES:  has No Known Allergies. ? ?MEDICATIONS:  ?Current Outpatient Medications  ?Medication Sig Dispense Refill  ? benzonatate (TESSALON) 200 MG capsule 1 capsule as needed    ? cholecalciferol (VITAMIN D3) 25 MCG (1000 UNIT) tablet Take 1 tablet (1,000 Units total) by mouth daily.    ? Cholecalciferol (VITAMIN D3) 25 MCG (1000 UT) CAPS 1 capsule    ? Cyanocobalamin (VITAMIN B-12) 3000 MCG SUBL Place 1 tablet under the tongue daily.    ? diclofenac (VOLTAREN) 75 MG EC tablet Take 1 tablet (75 mg total) by mouth 2 (two) times daily. 50 tablet 2  ? Fe Cbn-Fe Gluc-FA-B12-C-DSS (FERRALET 90) 90-1 MG TABS Take 1 tablet by mouth 2 (two) times daily.    ? ferrous sulfate 325 (65 FE) MG tablet 1 tablet    ? ibuprofen (ADVIL) 800 MG tablet Take 1 tablet (800 mg total) by mouth every 8 (eight) hours as needed. 30 tablet 0  ? LAGEVRIO 200 MG CAPS capsule SMARTSIG:4 Capsule(s) By Mouth Every 12 Hours    ? loratadine (CLARITIN) 10 MG tablet Take 10 mg by mouth daily as needed for allergies.    ? loratadine (CLARITIN) 10 MG tablet 1 tablet    ? vitamin B-12 (CYANOCOBALAMIN) 100 MCG tablet See admin instructions.    ? ?No current  facility-administered medications for this visit.  ? ? ?PHYSICAL EXAMINATION: ?ECOG PERFORMANCE STATUS: {CHL ONC ECOG DJ:[id number]} ? ?There were no vitals filed for this visit. ?There were no vitals filed for this visit. ? ?BREAST:*** No palpable masses or nodules in either right or left breasts. No palpable axillary supraclavicular or infraclavicular adenopathy no breast tenderness or nipple discharge. (exam performed in the presence of a chaperone) ? ?LABORATORY DATA:  ?I have reviewed the data as listed ? ?  Latest Ref Rng & Units 06/12/2018  ? 11:00 AM 03/22/2018  ? 12:42 PM  ?CMP  ?Glucose 70 - 99 mg/dL 92   110    ?BUN 6 - 20 mg/dL 12   9    ?Creatinine 0.44 - 1.00 mg/dL 0.90   0.89    ?Sodium 135 - 145 mmol/L 136   139    ?Potassium 3.5 - 5.1 mmol/L 4.3   4.0    ?Chloride 98 - 111 mmol/L 104   104    ?CO2 22 - 32 mmol/L 21   27    ?Calcium 8.9 - 10.3 mg/dL 9.8   9.4    ?Total Protein 6.5 - 8.1 g/dL 7.5   7.4    ?Total Bilirubin 0.3 - 1.2 mg/dL 0.8   0.4    ?Alkaline Phos 38 - 126 U/L 67   94    ?AST 15 - 41 U/L 16   13    ?  ALT 0 - 44 U/L 15   13    ? ? ?Lab Results  ?Component Value Date  ? WBC 5.2 06/12/2018  ? HGB 14.0 06/12/2018  ? HCT 43.8 06/12/2018  ? MCV 87.6 06/12/2018  ? PLT 241 06/12/2018  ? NEUTROABS 2.7 06/12/2018  ? ? ?ASSESSMENT & PLAN:  ?No problem-specific Assessment & Plan notes found for this encounter. ? ? ? ?No orders of the defined types were placed in this encounter. ? ?The patient has a good understanding of the overall plan. she agrees with it. she will call with any problems that may develop before the next visit here. ?Total time spent: 30 mins including face to face time and time spent for planning, charting and co-ordination of care ? ? Suzzette Righter, CMA ?03/27/21 ? ? ? I Yusef Lamp, Mylez Venable am scribing for Dr. Lindi Adie ? ?***  ?

## 2021-03-30 ENCOUNTER — Inpatient Hospital Stay: Payer: Self-pay | Attending: Hematology and Oncology | Admitting: Hematology and Oncology

## 2021-03-30 NOTE — Assessment & Plan Note (Deleted)
Florid ductal hyperplasia involving a papillary lesion,?[place] ?Status post lumpectomy:?06/14/2018:intraductal papilloma with usual ductal hyperplasia, with one benign lymph node ?The pathology report on the biopsy has been edited after the final surgery to call at florid ductal hyperplasia and not DCIS.??Decided against tamoxifen because by the algorithm her risk of recurrence was 3.6% and lifetime risk 24.4% ?? ?Breast cancer surveillance: ?1.??Breast exam 03/30/2021: Palpable lump in the right breast in the upper inner quadrant: I will obtain an urgent mammogram at [facility] ?2.?Mammograms and U/S?04/22/2020: Benign, Density Cat C (benign cyst 1 cm in Rt Breast) ?3.??Breast MRI? will be scheduled for October 2021.  If that is benign then we will do MRIs every other year. ?? ?Possible sebaceous cyst: I sent a message to Dr. Brantley Stage to evaluate her for surgical options.  It is extremely painful and uncomfortable on her back. ?? ?Return to clinic in 1 year for follow-up ?

## 2021-05-07 DIAGNOSIS — N6311 Unspecified lump in the right breast, upper outer quadrant: Secondary | ICD-10-CM | POA: Diagnosis not present

## 2021-05-07 DIAGNOSIS — R922 Inconclusive mammogram: Secondary | ICD-10-CM | POA: Diagnosis not present

## 2021-05-19 ENCOUNTER — Other Ambulatory Visit: Payer: Self-pay | Admitting: Radiology

## 2021-05-19 DIAGNOSIS — C50411 Malignant neoplasm of upper-outer quadrant of right female breast: Secondary | ICD-10-CM | POA: Diagnosis not present

## 2021-05-21 ENCOUNTER — Telehealth: Payer: Self-pay | Admitting: *Deleted

## 2021-05-21 NOTE — Telephone Encounter (Signed)
Pt with new breast cancer dx. Provided [facility] with appt date and time to see Dr. Lindi Adie.

## 2021-05-22 ENCOUNTER — Ambulatory Visit: Payer: Self-pay | Admitting: Surgery

## 2021-05-22 DIAGNOSIS — C50911 Malignant neoplasm of unspecified site of right female breast: Secondary | ICD-10-CM | POA: Diagnosis not present

## 2021-05-25 ENCOUNTER — Other Ambulatory Visit: Payer: Self-pay | Admitting: Radiology

## 2021-05-25 ENCOUNTER — Encounter: Payer: Self-pay | Admitting: Hematology and Oncology

## 2021-05-25 DIAGNOSIS — R590 Localized enlarged lymph nodes: Secondary | ICD-10-CM | POA: Diagnosis not present

## 2021-05-27 ENCOUNTER — Other Ambulatory Visit: Payer: Self-pay | Admitting: *Deleted

## 2021-05-27 ENCOUNTER — Encounter: Payer: Self-pay | Admitting: Radiology

## 2021-05-27 ENCOUNTER — Inpatient Hospital Stay
Payer: Federal, State, Local not specified - PPO | Attending: Hematology and Oncology | Admitting: Hematology and Oncology

## 2021-05-27 ENCOUNTER — Inpatient Hospital Stay: Payer: Federal, State, Local not specified - PPO

## 2021-05-27 VITALS — BP 127/93 | HR 77 | Temp 97.3°F | Resp 18 | Ht 66.0 in | Wt 182.8 lb

## 2021-05-27 DIAGNOSIS — Z8042 Family history of malignant neoplasm of prostate: Secondary | ICD-10-CM | POA: Insufficient documentation

## 2021-05-27 DIAGNOSIS — Z803 Family history of malignant neoplasm of breast: Secondary | ICD-10-CM | POA: Diagnosis not present

## 2021-05-27 DIAGNOSIS — Z8249 Family history of ischemic heart disease and other diseases of the circulatory system: Secondary | ICD-10-CM | POA: Insufficient documentation

## 2021-05-27 DIAGNOSIS — Z171 Estrogen receptor negative status [ER-]: Secondary | ICD-10-CM | POA: Insufficient documentation

## 2021-05-27 DIAGNOSIS — C50411 Malignant neoplasm of upper-outer quadrant of right female breast: Secondary | ICD-10-CM

## 2021-05-27 DIAGNOSIS — Z800 Family history of malignant neoplasm of digestive organs: Secondary | ICD-10-CM | POA: Insufficient documentation

## 2021-05-27 DIAGNOSIS — Z806 Family history of leukemia: Secondary | ICD-10-CM | POA: Diagnosis not present

## 2021-05-27 DIAGNOSIS — Z79899 Other long term (current) drug therapy: Secondary | ICD-10-CM | POA: Insufficient documentation

## 2021-05-27 LAB — CMP (CANCER CENTER ONLY)
ALT: 12 U/L (ref 0–44)
AST: 13 U/L — ABNORMAL LOW (ref 15–41)
Albumin: 4.6 g/dL (ref 3.5–5.0)
Alkaline Phosphatase: 106 U/L (ref 38–126)
Anion gap: 5 (ref 5–15)
BUN: 15 mg/dL (ref 6–20)
CO2: 30 mmol/L (ref 22–32)
Calcium: 10.4 mg/dL — ABNORMAL HIGH (ref 8.9–10.3)
Chloride: 102 mmol/L (ref 98–111)
Creatinine: 0.96 mg/dL (ref 0.44–1.00)
GFR, Estimated: >60 mL/min (ref >60)
Glucose, Bld: 93 mg/dL (ref 70–99)
Potassium: 4.3 mmol/L (ref 3.5–5.1)
Sodium: 137 mmol/L (ref 135–145)
Total Bilirubin: 0.5 mg/dL (ref 0.3–1.2)
Total Protein: 8.4 g/dL — ABNORMAL HIGH (ref 6.5–8.1)

## 2021-05-27 LAB — CBC WITH DIFFERENTIAL (CANCER CENTER ONLY)
Abs Immature Granulocytes: 0.01 10*3/uL (ref 0.00–0.07)
Basophils Absolute: 0.1 10*3/uL (ref 0.0–0.1)
Basophils Relative: 1 %
Eosinophils Absolute: 0.2 10*3/uL (ref 0.0–0.5)
Eosinophils Relative: 4 %
HCT: 44.5 % (ref 36.0–46.0)
Hemoglobin: 14.6 g/dL (ref 12.0–15.0)
Immature Granulocytes: 0 %
Lymphocytes Relative: 32 %
Lymphs Abs: 1.7 10*3/uL (ref 0.7–4.0)
MCH: 28.4 pg (ref 26.0–34.0)
MCHC: 32.8 g/dL (ref 30.0–36.0)
MCV: 86.6 fL (ref 80.0–100.0)
Monocytes Absolute: 0.4 10*3/uL (ref 0.1–1.0)
Monocytes Relative: 7 %
Neutro Abs: 3 10*3/uL (ref 1.7–7.7)
Neutrophils Relative %: 56 %
Platelet Count: 213 10*3/uL (ref 150–400)
RBC: 5.14 MIL/uL — ABNORMAL HIGH (ref 3.87–5.11)
RDW: 12.7 % (ref 11.5–15.5)
WBC Count: 5.4 10*3/uL (ref 4.0–10.5)
nRBC: 0 % (ref 0.0–0.2)

## 2021-05-27 MED ORDER — DEXAMETHASONE 4 MG PO TABS: 4.0000 mg | ORAL_TABLET | Freq: Every day | ORAL | 0 refills | Status: DC

## 2021-05-27 MED ORDER — PROCHLORPERAZINE MALEATE 10 MG PO TABS: 10.0000 mg | ORAL_TABLET | Freq: Four times a day (QID) | ORAL | 1 refills | Status: DC | PRN

## 2021-05-27 MED ORDER — LIDOCAINE-PRILOCAINE 2.5-2.5 % EX CREA: TOPICAL_CREAM | CUTANEOUS | 3 refills | Status: DC

## 2021-05-27 MED ORDER — ONDANSETRON HCL 8 MG PO TABS: 8.0000 mg | ORAL_TABLET | Freq: Two times a day (BID) | ORAL | 1 refills | Status: DC | PRN

## 2021-05-27 NOTE — Research (Signed)
[id number] - TREATMENT OF REFRACTORY NAUSEA   05/27/21  CONSENT INTRO: Patient Patricia Houston was identified by Dr. Lindi Adie as a potential candidate for the above listed study.  This Clinical Research Coordinator met with Patricia Houston, [id number], on 05/27/21 in a manner and location that ensures patient privacy to discuss participation in the above listed research study.  Patient is Accompanied by her daughter .  A copy of the informed consent document and separate HIPAA Authorization was provided to the patient.  Patient reads, speaks, and understands [place]. Patient was provided with the business card of this Coordinator and encouraged to contact the research team with any questions.  Approximately 10 minutes were spent with the patient reviewing the informed consent documents.  Patient was provided the option of taking informed consent documents home to review and was encouraged to review at their convenience with their support network, including other care providers. Patient took the consent documents home to review. Patient was thanked for her time. Plan to call patient at the end of the week for follow-up.   Carol Ada, RT(R)(T) Clinical Research Coordinator

## 2021-05-27 NOTE — Progress Notes (Signed)
START ON PATHWAY REGIMEN - Breast     Cycle 1: A cycle is 21 days:     Pertuzumab      Trastuzumab-xxxx      Docetaxel      Carboplatin    Cycles 2 through 6: A cycle is every 21 days:     Pertuzumab      Trastuzumab-xxxx      Docetaxel      Carboplatin   **Always confirm dose/schedule in your pharmacy ordering system**  Patient Characteristics: Preoperative or Nonsurgical Candidate (Clinical Staging), Neoadjuvant Therapy followed by Surgery, Invasive Disease, Chemotherapy, HER2 Positive, ER Negative/Unknown Therapeutic Status: Preoperative or Nonsurgical Candidate (Clinical Staging) AJCC M Category: cM0 AJCC Grade: G3 Breast Surgical Plan: Neoadjuvant Therapy followed by Surgery ER Status: Negative (-) AJCC 8 Stage Grouping: IIA HER2 Status: Positive (+) AJCC T Category: cT2 AJCC N Category: cN0 PR Status: Negative (-) Intent of Therapy: Curative Intent, Discussed with Patient

## 2021-05-27 NOTE — Progress Notes (Signed)
[facility] NOTE  Patient Care Team: Lois Huxley, PA as PCP - General (Family Medicine) Mauro Kaufmann, RN as Oncology Nurse Navigator Rockwell Germany, RN as Oncology Nurse Navigator Erroll Luna, MD as Consulting Physician (General Surgery) Nicholas Lose, MD as Consulting Physician (Hematology and Oncology) Eppie Gibson, MD as Attending Physician (Radiation Oncology)  CHIEF COMPLAINTS/PURPOSE OF CONSULTATION:  Newly diagnosed breast cancer  HISTORY OF PRESENTING ILLNESS:  Patricia Houston 56 y.o. female is here because of recent diagnosis of right breast cancer.  In 2020 she had a previous diagnosis of ADH.  On the most recent mammogram she was noticed to have a new density which by ultrasound measured 2.2 cm at the 1 o'clock position.  There were a total of 3 lymph nodes were normal.  One of the lymph node was biopsied and benign.  The breast pathology came back as grade 3 IDC with necrosis ER 0%, PR 0%, HER2 positive, Ki-67 60%.  She was presented this morning to the multidisciplinary tumor board and she is here today to discuss treatment plan.  I reviewed her records extensively and collaborated the history with the patient.  SUMMARY OF ONCOLOGIC HISTORY: Oncology History  Malignant neoplasm of upper-outer quadrant of right breast in female, estrogen receptor negative ([place])  05/19/2021 Relapse/Recurrence   Screening mammogram detected right breast density.  2 lymph nodes were noted biopsy appointment was benign.  Ultrasound revealed 2.2 cm mass at 11 o'clock position right breast biopsy: Grade 3 IDC with necrosis, ER 0%, PR 0%, Ki-67 60%, HER2 3+ positive by [facility]   05/27/2021 Cancer Staging   Staging form: Breast, AJCC 8th Edition - Clinical: Stage IIA (cT2, cN0, cM0, G3, ER-, PR-, HER2+) - Signed by Nicholas Lose, MD on 05/27/2021 Stage prefix: Initial diagnosis Histologic grading system: 3 grade system       MEDICAL HISTORY:  Past Medical  History:  Diagnosis Date   Anemia    TAKES IRON PILLS   Anxiety    Cancer ([place])    right breast DCIS   Depression     SURGICAL HISTORY: Past Surgical History:  Procedure Laterality Date   ABDOMINAL HYSTERECTOMY     BREAST LUMPECTOMY WITH RADIOACTIVE SEED LOCALIZATION Right 06/14/2018   Procedure: RIGHT BREAST RADIOACTIVE SEED LOCALIZATION LUMPECTOMY X2;  Surgeon: Erroll Luna, MD;  Location: [facility];  Service: General;  Laterality: Right;   LAPAROSCOPIC ASSISTED VAGINAL HYSTERECTOMY N/A 02/27/2014   Procedure: LAPAROSCOPIC ASSISTED VAGINAL HYSTERECTOMY;  Surgeon: Maeola Sarah. Landry Mellow, MD;  Location: [facility];  Service: Gynecology;  Laterality: N/A;  abdomen to vagina to abdomen   TONSILLECTOMY      SOCIAL HISTORY: Social History   Socioeconomic History   Marital status: Married    Spouse name: Not on file   Number of children: Not on file   Years of education: Not on file   Highest education level: Not on file  Occupational History   Not on file  Tobacco Use   Smoking status: Never   Smokeless tobacco: Never  Vaping Use   Vaping Use: Never used  Substance and Sexual Activity   Alcohol use: Yes    Comment: rare - 1-2 drinks every few months   Drug use: No   Sexual activity: Not on file  Other Topics Concern   Not on file  Social History Narrative   Not on file   Social Determinants of Health   Financial Resource Strain: Not on file  Food Insecurity: Not on file  Transportation Needs: Not on file  Physical Activity: Not on file  Stress: Not on file  Social Connections: Not on file  Intimate Partner Violence: Not on file    FAMILY HISTORY: Family History  Problem Relation Age of Onset   Breast cancer Mother        dx. late 54s; s/p mastectomy   Pancreatic cancer Mother 65       adenocarcinoma   Other Sister        one sister had a hysterectomy due to heavy bleeding at the age of 40   Heart attack Maternal Uncle 41   Heart attack Maternal  Grandmother 83   Prostate cancer Maternal Grandfather 86       "very aggressive"   Leukemia Other 8       maternal great aunt (MGM's sister)   Stomach cancer Other        maternal great aunt (MGF's sister) dx. over age 37   Heart Problems Paternal Grandmother    Cancer Other        maternal great uncle (MGM's brother) dx. NOS cancer at later age   Breast cancer Cousin        (2) maternal "2nd cousins" dx. with breast cancers in their mid-50s   Colon cancer Neg Hx     ALLERGIES:  has No Known Allergies.  MEDICATIONS:  Current Outpatient Medications  Medication Sig Dispense Refill   benzonatate (TESSALON) 200 MG capsule 1 capsule as needed     cholecalciferol (VITAMIN D3) 25 MCG (1000 UNIT) tablet Take 1 tablet (1,000 Units total) by mouth daily.     Cholecalciferol (VITAMIN D3) 25 MCG (1000 UT) CAPS 1 capsule     Cyanocobalamin (VITAMIN B-12) 3000 MCG SUBL Place 1 tablet under the tongue daily.     diclofenac (VOLTAREN) 75 MG EC tablet Take 1 tablet (75 mg total) by mouth 2 (two) times daily. 50 tablet 2   Fe Cbn-Fe Gluc-FA-B12-C-DSS (FERRALET 90) 90-1 MG TABS Take 1 tablet by mouth 2 (two) times daily.     ferrous sulfate 325 (65 FE) MG tablet 1 tablet     ibuprofen (ADVIL) 800 MG tablet Take 1 tablet (800 mg total) by mouth every 8 (eight) hours as needed. 30 tablet 0   LAGEVRIO 200 MG CAPS capsule SMARTSIG:4 Capsule(s) By Mouth Every 12 Hours     loratadine (CLARITIN) 10 MG tablet Take 10 mg by mouth daily as needed for allergies.     loratadine (CLARITIN) 10 MG tablet 1 tablet     vitamin B-12 (CYANOCOBALAMIN) 100 MCG tablet See admin instructions.     No current facility-administered medications for this visit.    REVIEW OF SYSTEMS:   Constitutional: Denies fevers, chills or abnormal night sweats  All other systems were reviewed with the patient and are negative.  PHYSICAL EXAMINATION: ECOG PERFORMANCE STATUS: 1 - Symptomatic but completely ambulatory  Vitals:    05/27/21 1132  BP: (!) 127/93  Pulse: 77  Resp: 18  Temp: (!) 97.3 F (36.3 C)  SpO2: 99%   Filed Weights   05/27/21 1132  Weight: 182 lb 12.8 oz (82.9 kg)      LABORATORY DATA:  I have reviewed the data as listed Lab Results  Component Value Date   WBC 5.4 05/27/2021   HGB 14.6 05/27/2021   HCT 44.5 05/27/2021   MCV 86.6 05/27/2021   PLT 213 05/27/2021   Lab Results  Component Value Date  NA 136 06/12/2018   K 4.3 06/12/2018   CL 104 06/12/2018   CO2 21 (L) 06/12/2018    RADIOGRAPHIC STUDIES: I have personally reviewed the radiological reports and agreed with the findings in the report.  ASSESSMENT AND PLAN:  Malignant neoplasm of upper-outer quadrant of right breast in female, estrogen receptor negative ([place]) 06/14/2018: Right lumpectomy: Intraductal papilloma with usual ductal hyperplasia, 1 benign lymph node (decided against tamoxifen) 05/19/2021: Screening mammogram detected right breast density.  2 lymph nodes were noted biopsy appointment was benign.  Ultrasound revealed 2.2 cm mass at 11 o'clock position right breast biopsy: Grade 3 IDC with necrosis, ER 0%, PR 0%, Ki-67 60%, HER2 3+ positive by [facility]  Pathology and radiology counseling: Discussed with the patient, the details of pathology including the type of breast cancer,the clinical staging, the significance of ER, PR and HER-2/neu receptors and the implications for treatment. After reviewing the pathology in detail, we proceeded to discuss the different treatment options between surgery, radiation, chemotherapy, antiestrogen therapies.  Recommendation based on multidisciplinary tumor board: 1. Neoadjuvant chemotherapy with TCH Perjeta 6 cycles followed by Herceptin Perjeta maintenance versus Kadcyla maintenance (based on response to neoadjuvant chemo) for 1 year 2. Followed by breast conserving surgery  with sentinel lymph node study 3. Followed by adjuvant radiation therapy  Chemotherapy Counseling: I  discussed the risks and benefits of chemotherapy including the risks of nausea/ vomiting, risk of infection from low WBC count, fatigue due to chemo or anemia, bruising or bleeding due to low platelets, mouth sores, loss/ change in taste and decreased appetite. Liver and kidney function will be monitored through out chemotherapy as abnormalities in liver and kidney function may be a side effect of treatment. Cardiac dysfunction due to Herceptin and Perjeta and neuropathy risk from Taxotere were discussed in detail. Risk of permanent bone marrow dysfunction due to chemo were also discussed.  Plan: 1. Port placement 2. Echocardiogram 3. Chemotherapy class 4. Breast MRI  Return to clinic in 2 weeks to start chemotherapy.    All questions were answered. The patient knows to call the clinic with any problems, questions or concerns.    Harriette Ohara, MD 05/27/21

## 2021-05-27 NOTE — Assessment & Plan Note (Addendum)
06/14/2018: Right lumpectomy: Intraductal papilloma with usual ductal hyperplasia, 1 benign lymph node (decided against tamoxifen) 05/19/2021: Screening mammogram detected right breast density.  2 lymph nodes were noted biopsy appointment was benign.  Ultrasound revealed 2.2 cm mass at 11 o'clock position right breast biopsy: Grade 3 IDC with necrosis, ER 0%, PR 0%, Ki-67 60%, HER2 3+ positive by [facility]  Pathology and radiology counseling: Discussed with the patient, the details of pathology including the type of breast cancer,the clinical staging, the significance of ER, PR and HER-2/neu receptors and the implications for treatment. After reviewing the pathology in detail, we proceeded to discuss the different treatment options between surgery, radiation, chemotherapy, antiestrogen therapies.  Recommendation based on multidisciplinary tumor board: 1. Neoadjuvant chemotherapy with TCH Perjeta 6 cycles followed by Herceptin Perjeta maintenance versus Kadcyla maintenance (based on response to neoadjuvant chemo) for 1 year 2. Followed by breast conserving surgery  with sentinel lymph node study 3. Followed by adjuvant radiation therapy  Chemotherapy Counseling: I discussed the risks and benefits of chemotherapy including the risks of nausea/ vomiting, risk of infection from low WBC count, fatigue due to chemo or anemia, bruising or bleeding due to low platelets, mouth sores, loss/ change in taste and decreased appetite. Liver and kidney function will be monitored through out chemotherapy as abnormalities in liver and kidney function may be a side effect of treatment. Cardiac dysfunction due to Herceptin and Perjeta and neuropathy risk from Taxotere were discussed in detail. Risk of permanent bone marrow dysfunction due to chemo were also discussed.  Plan: 1. Port placement 2. Echocardiogram 3. Chemotherapy class 4. Breast MRI  Return to clinic in 2 weeks to start chemotherapy.

## 2021-05-28 ENCOUNTER — Encounter: Payer: Self-pay | Admitting: Hematology and Oncology

## 2021-05-28 ENCOUNTER — Telehealth: Payer: Self-pay | Admitting: *Deleted

## 2021-05-28 ENCOUNTER — Encounter: Payer: Self-pay | Admitting: *Deleted

## 2021-05-28 NOTE — Telephone Encounter (Signed)
Spoke to pt, denies questions or concerns regarding dx or treatment care plan. Confirmed future appts. Encourage pt to call with needs. Received verbal understanding.

## 2021-05-29 ENCOUNTER — Telehealth: Payer: Self-pay | Admitting: Hematology and Oncology

## 2021-05-29 ENCOUNTER — Telehealth: Payer: Self-pay | Admitting: *Deleted

## 2021-05-29 NOTE — Telephone Encounter (Signed)
Received call from pt with complaint of right lower breast tightness extending to the axilla x 1 day.  Pt denies recent injury, trauma, or redness.  Pt states the right breast is slightly more swollen than the left. Per NP, pain could be related to recent breast biopsy.  Pt educated to rest and apply ice as well as take OTC Ibuprofen PRN to alleviate discomfort. Pt educated to contact the office next week if symptoms are still present for an [facility] appt.  Pt verbalized understanding.

## 2021-05-29 NOTE — Telephone Encounter (Signed)
.  Called patient to schedule appointment per 5/26 inbasket, patient is aware of date and time.   

## 2021-06-02 ENCOUNTER — Telehealth: Payer: Self-pay | Admitting: Radiation Oncology

## 2021-06-02 ENCOUNTER — Telehealth: Payer: Self-pay | Admitting: Radiology

## 2021-06-02 ENCOUNTER — Encounter: Payer: Self-pay | Admitting: Hematology and Oncology

## 2021-06-02 NOTE — Telephone Encounter (Signed)
LVM to schedule FUN with Dr. Isidore Moos

## 2021-06-02 NOTE — Telephone Encounter (Signed)
[id number] - TREATMENT OF REFRACTORY NAUSEA  06/02/21  PHONE CALL: LVM for patient to return call with any questions, concerns, or potential interest in the above mentioned study. Will attempt to call again at a later date.   Carol Ada, RT(R)(T) Clinical Research Coordinator

## 2021-06-03 ENCOUNTER — Telehealth: Payer: Self-pay | Admitting: Radiation Oncology

## 2021-06-03 NOTE — Telephone Encounter (Signed)
LVM on home and mobile numbers to schedule FUN with Dr. Isidore Moos.

## 2021-06-04 ENCOUNTER — Ambulatory Visit (HOSPITAL_COMMUNITY)
Admission: RE | Admit: 2021-06-04 | Discharge: 2021-06-04 | Disposition: A | Discharge status: Home / Self Care | Payer: Federal, State, Local not specified - PPO | Source: Ambulatory Visit | Attending: Hematology and Oncology | Admitting: Hematology and Oncology

## 2021-06-04 ENCOUNTER — Ambulatory Visit
Admission: RE | Admit: 2021-06-04 | Discharge: 2021-06-04 | Disposition: A | Discharge status: Home / Self Care | Payer: Self-pay | Source: Ambulatory Visit | Attending: Radiation Oncology | Admitting: Radiation Oncology

## 2021-06-04 ENCOUNTER — Other Ambulatory Visit: Payer: Self-pay | Admitting: Radiation Oncology

## 2021-06-04 ENCOUNTER — Inpatient Hospital Stay
Admission: RE | Admit: 2021-06-04 | Discharge: 2021-06-04 | Disposition: A | Discharge status: Home / Self Care | Payer: Self-pay | Source: Ambulatory Visit | Attending: Radiation Oncology | Admitting: Radiation Oncology

## 2021-06-04 DIAGNOSIS — Z0189 Encounter for other specified special examinations: Secondary | ICD-10-CM

## 2021-06-04 DIAGNOSIS — Z171 Estrogen receptor negative status [ER-]: Secondary | ICD-10-CM

## 2021-06-04 DIAGNOSIS — I081 Rheumatic disorders of both mitral and tricuspid valves: Secondary | ICD-10-CM | POA: Insufficient documentation

## 2021-06-04 DIAGNOSIS — Z5111 Encounter for antineoplastic chemotherapy: Secondary | ICD-10-CM | POA: Insufficient documentation

## 2021-06-04 DIAGNOSIS — C50411 Malignant neoplasm of upper-outer quadrant of right female breast: Secondary | ICD-10-CM | POA: Diagnosis not present

## 2021-06-04 LAB — ECHOCARDIOGRAM COMPLETE
Area-P 1/2: 2.96 cm2
S' Lateral: 2.6 cm

## 2021-06-04 NOTE — Progress Notes (Signed)
Patient Care Team: Lois Huxley, PA as PCP - General (Family Medicine) Mauro Kaufmann, RN as Oncology Nurse Navigator Rockwell Germany, RN as Oncology Nurse Navigator Erroll Luna, MD as Consulting Physician (General Surgery) Nicholas Lose, MD as Consulting Physician (Hematology and Oncology) Eppie Gibson, MD as Attending Physician (Radiation Oncology)  DIAGNOSIS:  Encounter Diagnosis  Name Primary?   Malignant neoplasm of upper-outer quadrant of right breast in female, estrogen receptor negative ([place])     SUMMARY OF ONCOLOGIC HISTORY: Oncology History  Malignant neoplasm of upper-outer quadrant of right breast in female, estrogen receptor negative ([place])  05/19/2021 Relapse/Recurrence   Screening mammogram detected right breast density.  2 lymph nodes were noted biopsy appointment was benign.  Ultrasound revealed 2.2 cm mass at 11 o'clock position right breast biopsy: Grade 3 IDC with necrosis, ER 0%, PR 0%, Ki-67 60%, HER2 3+ positive by [facility]   05/27/2021 Cancer Staging   Staging form: Breast, AJCC 8th Edition - Clinical: Stage IIA (cT2, cN0, cM0, G3, ER-, PR-, HER2+) - Signed by Nicholas Lose, MD on 05/27/2021 Stage prefix: Initial diagnosis Histologic grading system: 3 grade system   06/18/2021 -  Chemotherapy   Patient is on Treatment Plan : BREAST  Docetaxel + Carboplatin + Trastuzumab + Pertuzumab  (TCHP) q21d        CHIEF COMPLIANT:  TCHP cycle 1  INTERVAL HISTORY: Patricia Houston is a 56 y.o. female is here because of recent diagnosis of right breast cancer. She presents to the clinic today for a follow-up. States that she had some mild nausea yesterday.   ALLERGIES:  has No Known Allergies.  MEDICATIONS:  Current Outpatient Medications  Medication Sig Dispense Refill   Cetirizine HCl (ZYRTEC PO) Take by mouth.     cholecalciferol (VITAMIN D3) 25 MCG (1000 UNIT) tablet Take 1 tablet (1,000 Units total) by mouth daily.     Cyanocobalamin (VITAMIN B-12) 3000  MCG SUBL Place 1 tablet under the tongue daily.     dexamethasone (DECADRON) 4 MG tablet Take 1 tablet (4 mg total) by mouth daily. Take 1 tablet day before chemo and 1 tablet day after chemo with food 12 tablet 0   Fe Cbn-Fe Gluc-FA-B12-C-DSS (FERRALET 90) 90-1 MG TABS Take 1 tablet by mouth 2 (two) times daily.     ferrous sulfate 325 (65 FE) MG tablet 1 tablet     ibuprofen (ADVIL) 800 MG tablet Take 1 tablet (800 mg total) by mouth every 8 (eight) hours as needed. 30 tablet 0   lidocaine-prilocaine (EMLA) cream Apply to affected area once 30 g 3   loratadine (CLARITIN) 10 MG tablet Take 10 mg by mouth daily as needed for allergies.     loratadine (CLARITIN) 10 MG tablet 1 tablet     ondansetron (ZOFRAN) 8 MG tablet Take 1 tablet (8 mg total) by mouth 2 (two) times daily as needed (Nausea or vomiting). Start on the third day after chemotherapy. 30 tablet 1   prochlorperazine (COMPAZINE) 10 MG tablet Take 1 tablet (10 mg total) by mouth every 6 (six) hours as needed (Nausea or vomiting). 30 tablet 1   vitamin B-12 (CYANOCOBALAMIN) 100 MCG tablet See admin instructions.     No current facility-administered medications for this visit.    PHYSICAL EXAMINATION: ECOG PERFORMANCE STATUS: 1 - Symptomatic but completely ambulatory  Vitals:   06/18/21 0840  BP: (!) 132/94  Pulse: 72  Resp: 18  Temp: (!) 97.5 F (36.4 C)  SpO2: 100%  Filed Weights   06/18/21 0840  Weight: 184 lb 11.2 oz (83.8 kg)     LABORATORY DATA:  I have reviewed the data as listed    Latest Ref Rng & Units 05/27/2021   12:44 PM 06/12/2018   11:00 AM 03/22/2018   12:42 PM  CMP  Glucose 70 - 99 mg/dL 93  92  110   BUN 6 - 20 mg/dL _0 Creatinine 0.44 - 1.00 mg/dL 0.96  0.90  0.89   Sodium 135 - 145 mmol/L 137  136  139   Potassium 3.5 - 5.1 mmol/L 4.3  4.3  4.0   Chloride 98 - 111 mmol/L 102  104  104   CO2 22 - 32 mmol/L _1 Calcium 8.9 - 10.3 mg/dL 10.4  9.8  9.4   Total Protein 6.5 - 8.1  g/dL 8.4  7.5  7.4   Total Bilirubin 0.3 - 1.2 mg/dL 0.5  0.8  0.4   Alkaline Phos 38 - 126 U/L 106  67  94   AST 15 - 41 U/L _2 ALT 0 - 44 U/L _3 Lab Results  Component Value Date   WBC 5.4 06/18/2021   HGB 12.6 06/18/2021   HCT 37.8 06/18/2021   MCV 86.3 06/18/2021   PLT 191 06/18/2021   NEUTROABS 3.2 06/18/2021    ASSESSMENT & PLAN:  Malignant neoplasm of upper-outer quadrant of right breast in female, estrogen receptor negative ([place]) 06/14/2018: Right lumpectomy: Intraductal papilloma with usual ductal hyperplasia, 1 benign lymph node (decided against tamoxifen) 05/19/2021: Screening mammogram detected right breast density.  2 lymph nodes were noted biopsy appointment was benign.  Ultrasound revealed 2.2 cm mass at 11 o'clock position right breast biopsy: Grade 3 IDC with necrosis, ER 0%, PR 0%, Ki-67 60%, HER2 3+ positive by IHC  Treatment Plan: 1. Neoadjuvant chemotherapy with TCH Perjeta 6 cycles followed by Herceptin Perjeta maintenance versus Kadcyla maintenance (based on response to neoadjuvant chemo) for 1 year 2. Followed by breast conserving surgery  with sentinel lymph node study 3. Followed by adjuvant radiation therapy  MRI Breast 06/08/21:  Rt Breast cancer 2.8 cm, additional NME total 6.5 cm, Left breast 0.6 cm retroareolar mass, additional indeterminate NME 3.5 cm (Need MRI guided biopsies Rt Br and Left Breast) CT CAP 06/10/21: Breast masses and Bil Axillary LN, 4 mm Rt LL nodule, tiny hepatic densities 4 mm too small, nodular area inferior bladder (wall thickening vs ext effacement) Bone scan 06/10/21: Benign --------------------------------------------------------------------------------------------------------------- Current Treatment: Cycle 1 TCHP ECHO EF 60-65% Chemo consent obtained. Chemo education completed  I discussed the imaging findings in detail and explained that MRI biopsies are needed. Non specific findings in lung, liver an  dbladder RTC in 1 week for Tox check    No orders of the defined types were placed in this encounter.  The patient has a good understanding of the overall plan. she agrees with it. she will call with any problems that may develop before the next visit here. Total time spent: 30 mins including face to face time and time spent for planning, charting and co-ordination of care   Harriette Ohara, MD 06/18/21    I Gardiner Coins am scribing for Dr. Lindi Adie  I have reviewed the above documentation for accuracy and completeness, and I agree with the above.

## 2021-06-05 ENCOUNTER — Encounter: Payer: Self-pay | Admitting: Hematology and Oncology

## 2021-06-05 ENCOUNTER — Other Ambulatory Visit (HOSPITAL_COMMUNITY): Payer: 59

## 2021-06-05 NOTE — Telephone Encounter (Signed)
[id number] - TREATMENT OF REFRACTORY NAUSEA   06/05/21  PHONE CALL: Confirmed I was speaking with [name] . Informed patient reason for call is to follow-up on the above mentioned study. Patient is interested in participation. This coordinator will meet with [name] next Tuesday 6/6 at [facility] for consent overview. Patient was thanked for her time and support of the above mentioned study and I look forward to seeing her next week.   Carol Ada, RT(R)(T) Clinical Research Coordinator

## 2021-06-08 ENCOUNTER — Ambulatory Visit
Admission: RE | Admit: 2021-06-08 | Discharge: 2021-06-08 | Disposition: A | Discharge status: Home / Self Care | Payer: Federal, State, Local not specified - PPO | Source: Ambulatory Visit | Attending: Hematology and Oncology | Admitting: Hematology and Oncology

## 2021-06-08 ENCOUNTER — Encounter: Payer: Self-pay | Admitting: *Deleted

## 2021-06-08 DIAGNOSIS — Z171 Estrogen receptor negative status [ER-]: Secondary | ICD-10-CM

## 2021-06-08 DIAGNOSIS — C50911 Malignant neoplasm of unspecified site of right female breast: Secondary | ICD-10-CM | POA: Diagnosis not present

## 2021-06-08 DIAGNOSIS — Z803 Family history of malignant neoplasm of breast: Secondary | ICD-10-CM | POA: Diagnosis not present

## 2021-06-08 DIAGNOSIS — N6341 Unspecified lump in right breast, subareolar: Secondary | ICD-10-CM | POA: Diagnosis not present

## 2021-06-08 IMAGING — MR MR BREAST BILAT WO/W CM
8 of 12 series · 31 of 48 positions shown · IV contrast (gadavist)
Comparison: None Available.

CLINICAL DATA: 55-year-old female presenting for or staging
evaluation. History of right breast excision demonstrating
intraductal papilloma in [X4]. Family history of breast cancer in
the patient's mother.

EXAM:
BILATERAL BREAST MRI WITH AND WITHOUT CONTRAST
TECHNIQUE: Multiplanar, multisequence MR images of both breasts were obtained
prior to and following the intravenous administration of 8 ml of
Gadavist

[Series 2: t2_tirm_tra ipat (a-p) · axial · 3.0mm · 0.70mm/px · 1 of 55 slices shown]
[im 1/55]
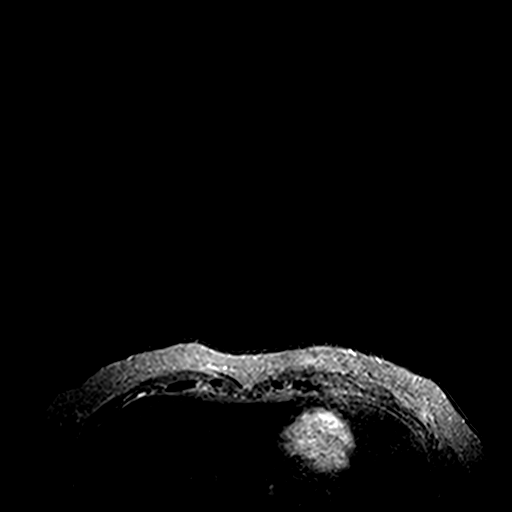

[Series 3: fl3d pre-cm no · axial · non-contrast · 1.2mm · 0.89mm/px · z∈[-63,+109]mm · 5 of 144 slices shown]
[im 1/144]
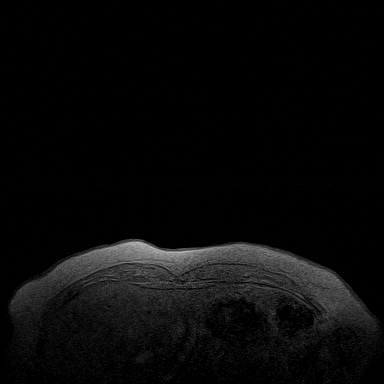
[im 36/144]
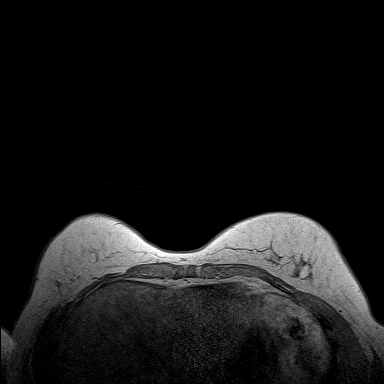
[im 72/144]
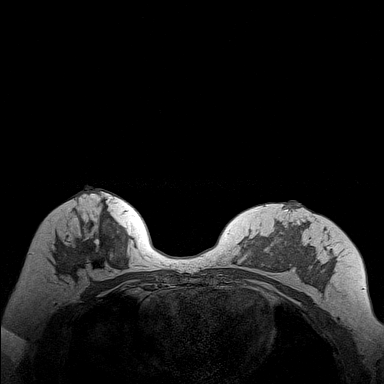
[im 108/144]
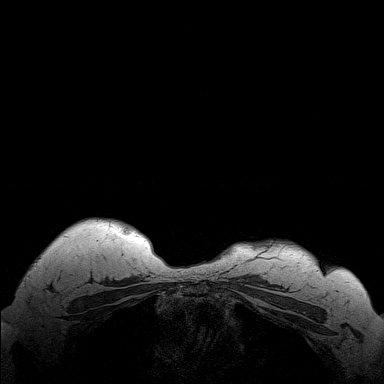
[im 144/144]
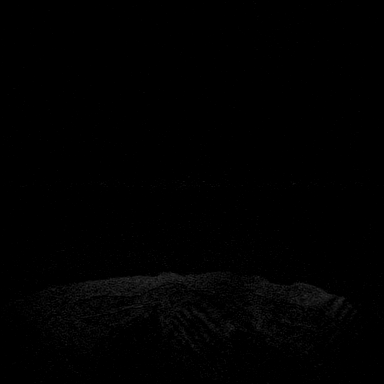

[Series 4: fl3d pre-cm · axial · non-contrast · 1.2mm · 0.89mm/px · z∈[-63,+109]mm · 5 of 144 slices shown]
[im 1/144]
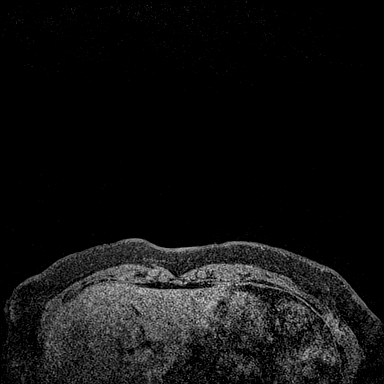
[im 36/144]
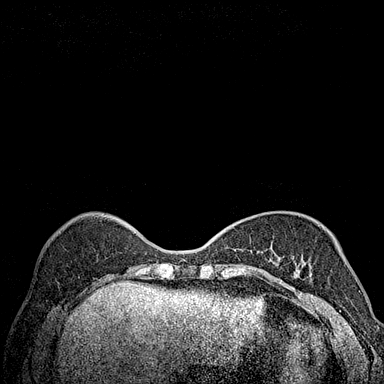
[im 72/144]
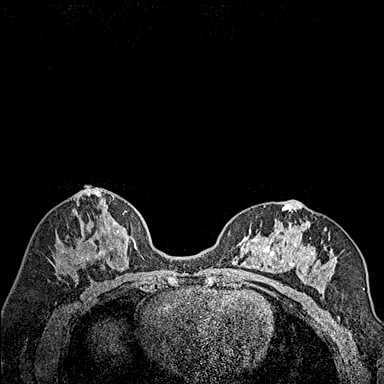
[im 108/144]
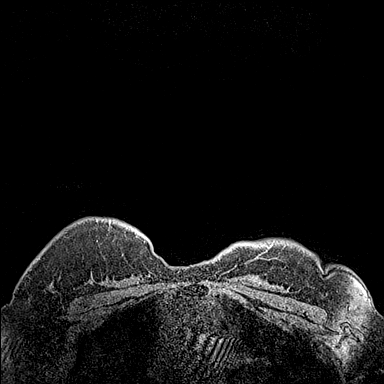
[im 144/144]
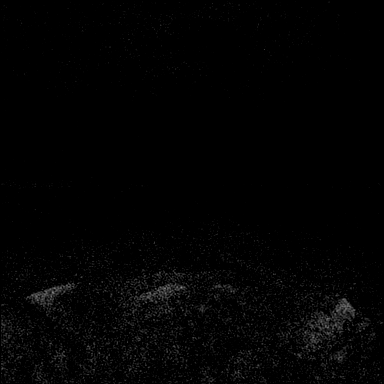

[Series 5: fl3d post-cm 20 · axial · 1.2mm · 0.89mm/px · z∈[-63,+109]mm · 5 of 144 slices shown (1 of 3)]
[im 1/144]
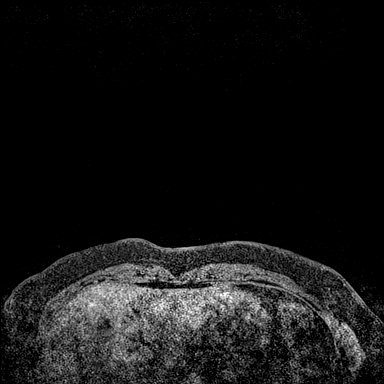
[im 36/144]
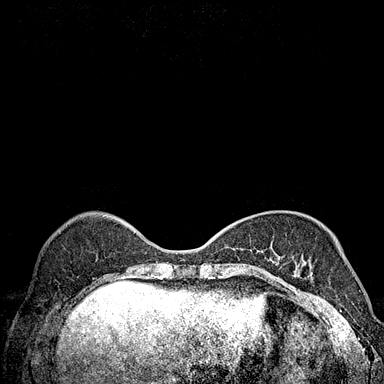
[im 72/144]
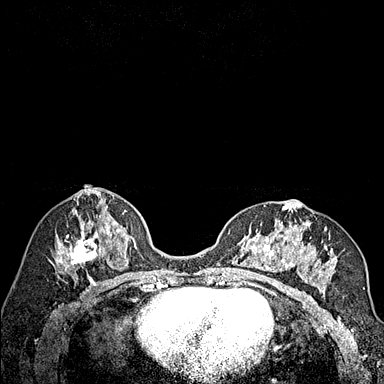
[im 108/144]
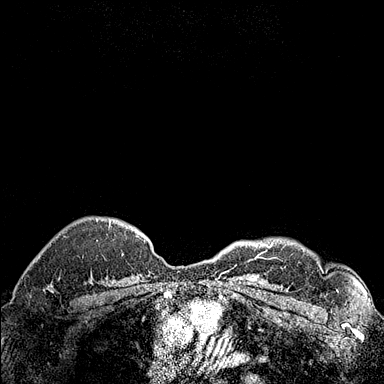
[im 144/144]
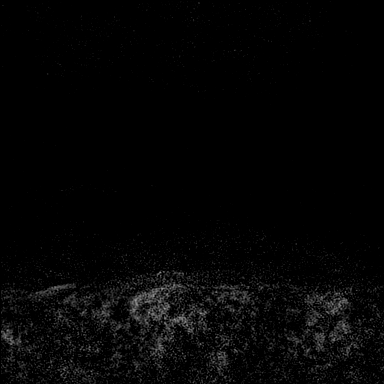

[Series 6: fl3d post-cm 20 · axial · 1.2mm · 0.89mm/px · z∈[-63,+109]mm · 5 of 144 slices shown (2 of 3)]
[im 1/144]
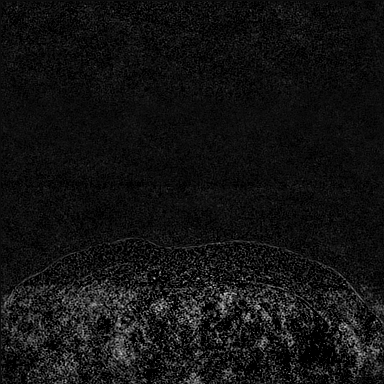
[im 36/144]
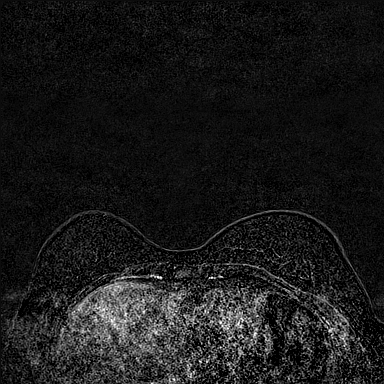
[im 72/144]
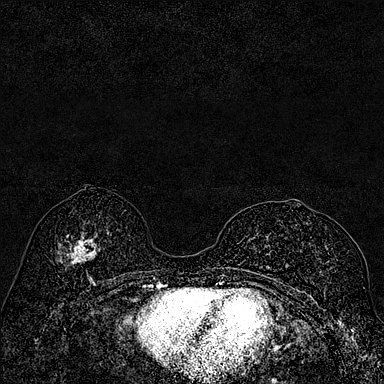
[im 108/144]
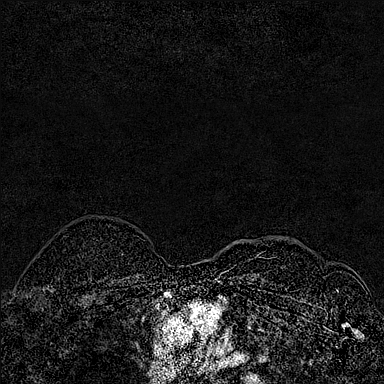
[im 144/144]
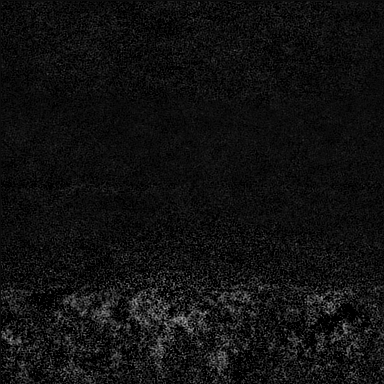

[Series 7: fl3d post-cm 20 · axial · 172.8mm · 0.89mm/px · 1 of 1 slices shown (3 of 3)]
[im 1/1]
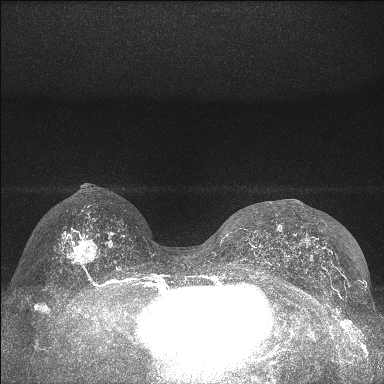

[Series 8: fl3d post-cm 3 · axial · 1.2mm · 0.89mm/px · z∈[-63,+109]mm · 6 of 144 slices shown (1 of 2)]
[im 1/144]
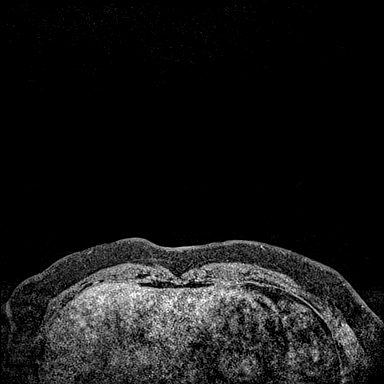
[im 29/144]
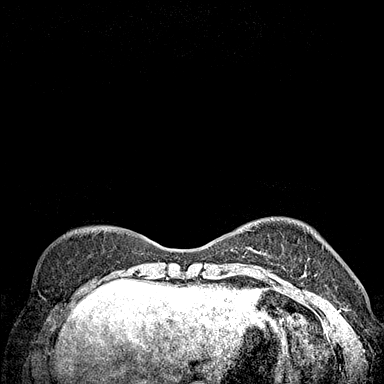
[im 58/144]
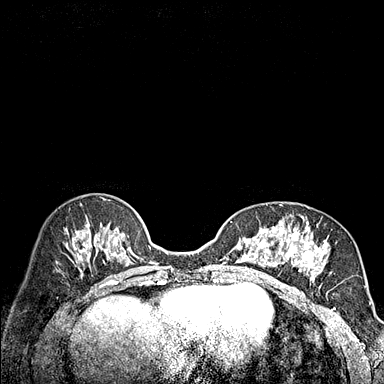
[im 86/144]
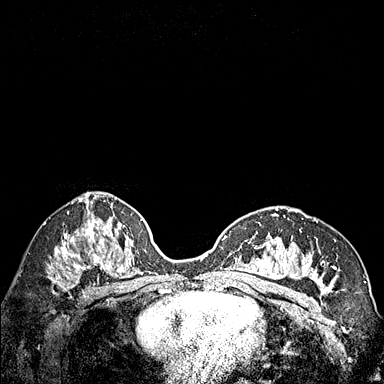
[im 115/144]
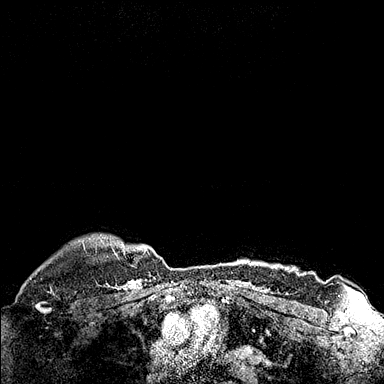
[im 144/144]
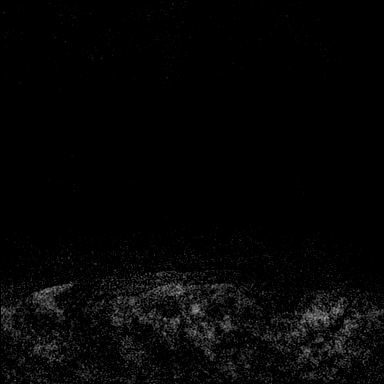

[Series 9: fl3d post-cm 3 · axial · 1.2mm · 0.89mm/px · z∈[-63,+6]mm · 3 of 144 slices shown (2 of 2)]
[im 1/144]
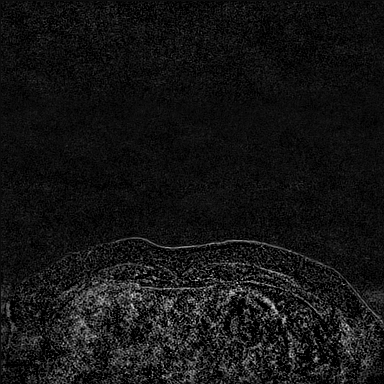
[im 29/144]
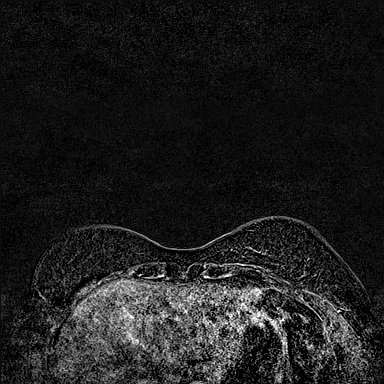
[im 58/144]
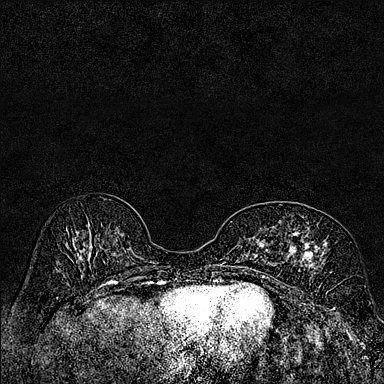

[31 of 48 positions shown; findings below may reference images not displayed]

Three-dimensional MR images were rendered by post-processing of the
original MR data on an independent workstation. The
three-dimensional MR images were interpreted, and findings are
reported in the following complete MRI report for this study. Three
dimensional images were evaluated at the independent interpreting
workstation using the DynaCAD thin client.
FINDINGS: Breast composition: c. Heterogeneous fibroglandular tissue.

Background parenchymal enhancement: Moderate.

Right breast: There is an irregular heterogeneously enhancing mass
in the central posterior right breast consistent with biopsy proven
malignancy. The mass measures 2.8 x 2.1 x 2.5 cm (series 6, image
74). There is additional subtle non mass enhancement and small
masses surrounding the main mass in extending anteriorly towards the
nipple. The overall area of abnormality measures approximately 6.5 x
4.9 cm. A representative satellite medial to the dominant mass
measures 6 mm (series 6, image 66).

Left breast: There is a small enhancing mass in the retroareolar
left breast measuring 0.6 cm (series 6, image 78). There is an
additional area of clumped non mass enhancement in the lower
slightly outer left breast spanning approximately 3.5 x 2.3 cm
(series 9, image 86).

Lymph nodes: No abnormal appearing lymph nodes. There is
susceptibility artifact in a right axillary lymph node consistent
with a biopsy marking clip.

Ancillary findings:  None.
IMPRESSION: 1. Biopsy-proven malignancy in the central posterior right breast
measuring 2.8 x 2.1 x 2.5 cm. There is additional subtle non mass
enhancement and small masses surrounding the main mass extending
anteriorly towards the nipple. The overall area of abnormality
measures approximately 6.5 x 4.9 cm.
2. Indeterminate 0.6 cm enhancing mass in the retroareolar left
breast.
3. Additional indeterminate area of clumped non mass enhancement in
the lower slightly outer left breast spanning approximately 3.5 x
2.3 cm.

RECOMMENDATION:
1. If breast conservation is desired recommend additional MRI guided
biopsies x2 for the right breast targeting the medial dominant
satellite lesion and the non mass enhancement in the retroareolar
aspect.

2. MRI guided biopsy x 2 of the left breast for the small
retroareolar mass and clumped non mass enhancement.

3.  Schedule right and left breast on separate days.

BI-RADS CATEGORY  4: Suspicious.

## 2021-06-08 MED ORDER — GADOBUTROL 1 MMOL/ML IV SOLN
8.0000 mL | Freq: Once | INTRAVENOUS | Status: AC | PRN
Start: 2021-06-08 — End: 2021-06-08
  Administered 2021-06-08: 8 mL via INTRAVENOUS

## 2021-06-09 ENCOUNTER — Ambulatory Visit (HOSPITAL_COMMUNITY)
Admission: RE | Admit: 2021-06-09 | Discharge: 2021-06-09 | Disposition: A | Discharge status: Home / Self Care | Payer: Federal, State, Local not specified - PPO | Source: Ambulatory Visit | Attending: Hematology and Oncology | Admitting: Hematology and Oncology

## 2021-06-09 ENCOUNTER — Inpatient Hospital Stay: Payer: Federal, State, Local not specified - PPO | Attending: Hematology and Oncology | Admitting: Radiology

## 2021-06-09 ENCOUNTER — Other Ambulatory Visit: Payer: Self-pay

## 2021-06-09 DIAGNOSIS — K7689 Other specified diseases of liver: Secondary | ICD-10-CM | POA: Diagnosis not present

## 2021-06-09 DIAGNOSIS — C50919 Malignant neoplasm of unspecified site of unspecified female breast: Secondary | ICD-10-CM | POA: Diagnosis not present

## 2021-06-09 DIAGNOSIS — R5383 Other fatigue: Secondary | ICD-10-CM | POA: Insufficient documentation

## 2021-06-09 DIAGNOSIS — R590 Localized enlarged lymph nodes: Secondary | ICD-10-CM | POA: Diagnosis not present

## 2021-06-09 DIAGNOSIS — C50411 Malignant neoplasm of upper-outer quadrant of right female breast: Secondary | ICD-10-CM | POA: Insufficient documentation

## 2021-06-09 DIAGNOSIS — Q433 Congenital malformations of intestinal fixation: Secondary | ICD-10-CM | POA: Diagnosis not present

## 2021-06-09 DIAGNOSIS — Z171 Estrogen receptor negative status [ER-]: Secondary | ICD-10-CM | POA: Insufficient documentation

## 2021-06-09 DIAGNOSIS — R197 Diarrhea, unspecified: Secondary | ICD-10-CM | POA: Insufficient documentation

## 2021-06-09 DIAGNOSIS — R102 Pelvic and perineal pain: Secondary | ICD-10-CM | POA: Insufficient documentation

## 2021-06-09 DIAGNOSIS — Z79899 Other long term (current) drug therapy: Secondary | ICD-10-CM | POA: Insufficient documentation

## 2021-06-09 DIAGNOSIS — Z7952 Long term (current) use of systemic steroids: Secondary | ICD-10-CM | POA: Insufficient documentation

## 2021-06-09 DIAGNOSIS — K449 Diaphragmatic hernia without obstruction or gangrene: Secondary | ICD-10-CM | POA: Diagnosis not present

## 2021-06-09 DIAGNOSIS — R911 Solitary pulmonary nodule: Secondary | ICD-10-CM | POA: Diagnosis not present

## 2021-06-09 DIAGNOSIS — M898X9 Other specified disorders of bone, unspecified site: Secondary | ICD-10-CM | POA: Insufficient documentation

## 2021-06-09 DIAGNOSIS — C50911 Malignant neoplasm of unspecified site of right female breast: Secondary | ICD-10-CM | POA: Diagnosis not present

## 2021-06-09 IMAGING — NM NM BONE WHOLE BODY
2 series · 2 of 2 positions shown · non-contrast
Comparison: None Available.

CLINICAL DATA: Breast cancer, staging.

EXAM:
NUCLEAR MEDICINE WHOLE BODY BONE SCAN
TECHNIQUE: Whole body anterior and posterior images were obtained approximately
3 hours after intravenous injection of radiopharmaceutical.
RADIOPHARMACEUTICALS:  19.2 mCi [O3] MDP IV

[Series 1: wbr_bone_40 whole body · 2.66mm/px · 1 of 1 slices shown (1 of 2)]
[im 1/1]
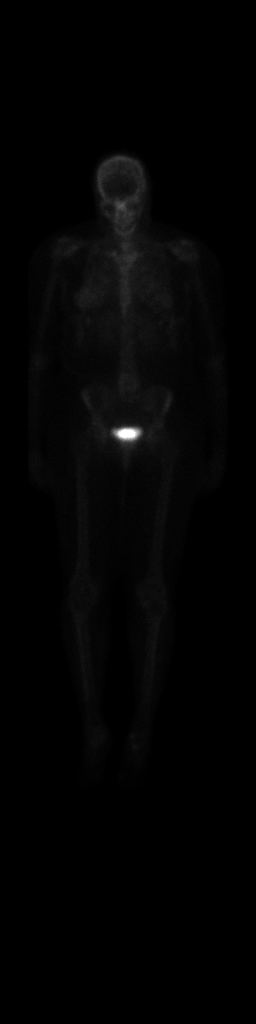

[Series 1: wbr_bone_40 whole body · 2.66mm/px · 1 of 1 slices shown (2 of 2)]
[im 1/1]
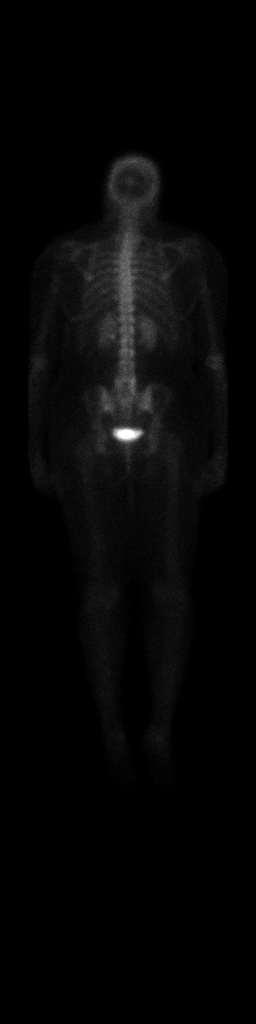

[2 of 2 positions shown; findings below may reference images not displayed]

FINDINGS: No scintigraphic evidence of abnormal radiotracer uptake to suggest
osteoblastic metastatic disease.

Multifocal radiotracer uptake in a pattern most consistent with
degenerative arthropathy.

Otherwise physiologic distribution of radiotracer activity.
IMPRESSION: No scintigraphic evidence of osteoblastic metastatic disease.

## 2021-06-09 MED ORDER — IOHEXOL 300 MG/ML  SOLN
100.0000 mL | Freq: Once | INTRAMUSCULAR | Status: AC | PRN
  Administered 2021-06-09: 100 mL via INTRAVENOUS

## 2021-06-09 MED ORDER — TECHNETIUM TC 99M MEDRONATE IV KIT
20.0000 | PACK | Freq: Once | INTRAVENOUS | Status: AC | PRN
  Administered 2021-06-09: 19.2 via INTRAVENOUS

## 2021-06-09 MED ORDER — IOHEXOL 9 MG/ML PO SOLN
ORAL | Status: AC
  Filled 2021-06-09: qty 1000

## 2021-06-09 MED ORDER — IOHEXOL 9 MG/ML PO SOLN: 1000.0000 mL | ORAL | Status: AC

## 2021-06-09 MED ORDER — SODIUM CHLORIDE (PF) 0.9 % IJ SOLN
INTRAMUSCULAR | Status: AC
  Filled 2021-06-09: qty 50

## 2021-06-09 NOTE — Research (Signed)
[id number] - TREATMENT OF REFRACTORY NAUSEA   06/09/21  ELIGIBILITY:  This Coordinator has reviewed this patient's inclusion and exclusion criteria and confirmed Patricia Houston is eligible for study participation.  Patient will continue with enrollment.  Menopausal status (women only): ANALYSE ANGST had a hysterectomy 2016.  Eligibility confirmed by research nurse and treating investigator, who also agrees that patient should proceed with enrollment.    CONSENT: Patient Patricia Houston was identified by Dr. Lindi Adie as a potential candidate for the above listed study.  This Clinical Research Coordinator met with WREATHA STURGEON, [id number] on 06/09/21 in a manner and location that ensures patient privacy to discuss participation in the above listed research study.  Patient is Unaccompanied.  Patient was previously provided with informed consent documents.  Patient confirmed they have read the informed consent documents.  As outlined in the informed consent form, this Coordinator and BIRIDIANA TWARDOWSKI discussed the purpose of the research study, the investigational nature of the study, study procedures and requirements for study participation, potential risks and benefits of study participation, as well as alternatives to participation.  This study is blinded or double-blinded. The patient understands participation is voluntary and they may withdraw from study participation at any time.  Each study arm was reviewed, and randomization discussed.  Potential side effects were reviewed with patient as outlined in the consent form, and patient made aware there may be side effects not yet known. The chance of receiving placebo was discussed. Patient understands enrollment is pending full eligibility review.   Confidentiality and how the patient's information will be used as part of study participation were discussed.  Patient was informed there is not reimbursement provided for their time and  effort spent on trial participation.  The patient is encouraged to discuss research study participation with their insurance provider to determine what costs they may incur as part of study participation, including research related injury.    All questions were answered to patient's satisfaction.  The informed consent and separate HIPAA Authorization was reviewed page by page.  The patient's mental and emotional status is appropriate to provide informed consent, and the patient verbalizes an understanding of study participation.  Patient has agreed to participate in the above listed research study and has voluntarily signed the informed consent version dated 01/07/21 and separate HIPAA Authorization, version dated 03/22/16  on 06/09/21 at 10:00AM.  The patient was provided with a copy of the signed informed consent form and separate HIPAA Authorization for their reference.  No study specific procedures were obtained prior to the signing of the informed consent document.  Approximately 30 minutes were spent with the patient reviewing the informed consent documents.  Release of information will be requested at next visit.   After completion of consent documents, patient confirmed medication list and informed this coordinator of any medication changes. Patient was thanked for her time and support of the above mentioned study.   Carol Ada, RT(R)(T) Clinical Research Coordinator

## 2021-06-10 ENCOUNTER — Encounter: Payer: Self-pay | Admitting: *Deleted

## 2021-06-10 ENCOUNTER — Other Ambulatory Visit: Payer: Self-pay | Admitting: *Deleted

## 2021-06-10 ENCOUNTER — Other Ambulatory Visit: Payer: Self-pay | Admitting: Hematology and Oncology

## 2021-06-10 DIAGNOSIS — R928 Other abnormal and inconclusive findings on diagnostic imaging of breast: Secondary | ICD-10-CM

## 2021-06-10 DIAGNOSIS — Z171 Estrogen receptor negative status [ER-]: Secondary | ICD-10-CM

## 2021-06-11 ENCOUNTER — Encounter: Payer: Self-pay | Admitting: *Deleted

## 2021-06-11 NOTE — Progress Notes (Signed)
Pharmacist Chemotherapy Monitoring - Initial Assessment    Anticipated start date: 06/18/21   The following has been reviewed per standard work regarding the patient's treatment regimen: The patient's diagnosis, treatment plan and drug doses, and organ/hematologic function Lab orders and baseline tests specific to treatment regimen  The treatment plan start date, drug sequencing, and pre-medications Prior authorization status  Patient's documented medication list, including drug-drug interaction screen and prescriptions for anti-emetics and supportive care specific to the treatment regimen The drug concentrations, fluid compatibility, administration routes, and timing of the medications to be used The patient's access for treatment and lifetime cumulative dose history, if applicable  The patient's medication allergies and previous infusion related reactions, if applicable   Changes made to treatment plan:  N/A  Follow up needed:  Pending authorization for treatment    Kennith Center, Pharm.D., CPP 06/11/2021'@3'$ :10 PM

## 2021-06-11 NOTE — Progress Notes (Signed)
Patient Care Team: Nicholas Lose, MD as PCP - General (Hematology and Oncology) Mauro Kaufmann, RN as Oncology Nurse Navigator Rockwell Germany, RN as Oncology Nurse Navigator Erroll Luna, MD as Consulting Physician (General Surgery) Nicholas Lose, MD as Consulting Physician (Hematology and Oncology) Eppie Gibson, MD as Attending Physician (Radiation Oncology)  DIAGNOSIS:  Encounter Diagnosis  Name Primary?   Malignant neoplasm of upper-outer quadrant of right breast in female, estrogen receptor negative ([place])     SUMMARY OF ONCOLOGIC HISTORY: Oncology History  Malignant neoplasm of upper-outer quadrant of right breast in female, estrogen receptor negative ([place])  05/19/2021 Relapse/Recurrence   Screening mammogram detected right breast density.  2 lymph nodes were noted biopsy appointment was benign.  Ultrasound revealed 2.2 cm mass at 11 o'clock position right breast biopsy: Grade 3 IDC with necrosis, ER 0%, PR 0%, Ki-67 60%, HER2 3+ positive by [facility]   05/27/2021 Cancer Staging   Staging form: Breast, AJCC 8th Edition - Clinical: Stage IIA (cT2, cN0, cM0, G3, ER-, PR-, HER2+) - Signed by Nicholas Lose, MD on 05/27/2021 Stage prefix: Initial diagnosis Histologic grading system: 3 grade system   06/18/2021 -  Chemotherapy   Patient is on Treatment Plan : BREAST  Docetaxel + Carboplatin + Trastuzumab + Pertuzumab  (TCHP) q21d        CHIEF COMPLIANT: Follow-up TCHP cycle 1 day 8  INTERVAL HISTORY: Patricia Houston is a 56 y.o. female is here because of recent diagnosis of right breast cancer. She presents to the clinic today for a follow-up. States she didn't have nausea until the next day after treatment. States that she moved around a little bit. States that she was drinking water. Complains of pelvic pain after the shot. Complains of diarrhea.States that she had at least 2 or 3 a day. States yesterday she had a light breakfast and no nausea. States the pain and aches was still  there but she worked through it. States that she was having Spasm in mid back. States that she still in pain today but it's better than yesterday. Overall everything else is manageable.   ALLERGIES:  has No Known Allergies.  MEDICATIONS:  Current Outpatient Medications  Medication Sig Dispense Refill   Cetirizine HCl (ZYRTEC PO) Take by mouth.     cholecalciferol (VITAMIN D3) 25 MCG (1000 UNIT) tablet Take 1 tablet (1,000 Units total) by mouth daily.     Cyanocobalamin (VITAMIN B-12) 3000 MCG SUBL Place 1 tablet under the tongue daily.     dexamethasone (DECADRON) 4 MG tablet Take 1 tablet (4 mg total) by mouth daily. Take 1 tablet day before chemo and 1 tablet day after chemo with food 12 tablet 0   Fe Cbn-Fe Gluc-FA-B12-C-DSS (FERRALET 90) 90-1 MG TABS Take 1 tablet by mouth 2 (two) times daily.     ferrous sulfate 325 (65 FE) MG tablet 1 tablet     ibuprofen (ADVIL) 800 MG tablet Take 1 tablet (800 mg total) by mouth every 8 (eight) hours as needed. 30 tablet 0   lidocaine-prilocaine (EMLA) cream Apply to affected area once 30 g 3   loratadine (CLARITIN) 10 MG tablet Take 10 mg by mouth daily as needed for allergies.     loratadine (CLARITIN) 10 MG tablet 1 tablet     ondansetron (ZOFRAN) 8 MG tablet Take 1 tablet (8 mg total) by mouth 2 (two) times daily as needed (Nausea or vomiting). Start on the third day after chemotherapy. 30 tablet 1  prochlorperazine (COMPAZINE) 10 MG tablet Take 1 tablet (10 mg total) by mouth every 6 (six) hours as needed (Nausea or vomiting). 30 tablet 1   vitamin B-12 (CYANOCOBALAMIN) 100 MCG tablet See admin instructions.     No current facility-administered medications for this visit.    PHYSICAL EXAMINATION: ECOG PERFORMANCE STATUS: 1 - Symptomatic but completely ambulatory  Vitals:   06/24/21 1222  BP: 129/79  Pulse: 96  Resp: 18  Temp: (!) 97.5 F (36.4 C)  SpO2: 100%   Filed Weights   06/24/21 1222  Weight: 180 lb 3.2 oz (81.7 kg)       LABORATORY DATA:  I have reviewed the data as listed    Latest Ref Rng & Units 06/24/2021   11:57 AM 06/18/2021    8:17 AM 05/27/2021   12:44 PM  CMP  Glucose 70 - 99 mg/dL 105  144  93   BUN 6 - 20 mg/dL _0 Creatinine 0.44 - 1.00 mg/dL 0.85  0.96  0.96   Sodium 135 - 145 mmol/L 134  138  137   Potassium 3.5 - 5.1 mmol/L 4.3  3.7  4.3   Chloride 98 - 111 mmol/L 103  104  102   CO2 22 - 32 mmol/L _1 Calcium 8.9 - 10.3 mg/dL 10.0  10.1  10.4   Total Protein 6.5 - 8.1 g/dL 7.7  7.4  8.4   Total Bilirubin 0.3 - 1.2 mg/dL 0.4  0.4  0.5   Alkaline Phos 38 - 126 U/L 92  100  106   AST 15 - 41 U/L _2 ALT 0 - 44 U/L 33  19  12     Lab Results  Component Value Date   WBC 6.4 06/24/2021   HGB 12.6 06/24/2021   HCT 37.5 06/24/2021   MCV 83.9 06/24/2021   PLT 159 06/24/2021   NEUTROABS 3.4 06/24/2021    ASSESSMENT & PLAN:  Malignant neoplasm of upper-outer quadrant of right breast in female, estrogen receptor negative ([place]) receptor negative ([place]) 06/14/2018: Right lumpectomy: Intraductal papilloma with usual ductal hyperplasia, 1 benign lymph node (decided against tamoxifen) 05/19/2021: Screening mammogram detected right breast density.  2 lymph nodes were noted biopsy appointment was benign.  Ultrasound revealed 2.2 cm mass at 11 o'clock position right breast biopsy: Grade 3 IDC with necrosis, ER 0%, PR 0%, Ki-67 60%, HER2 3+ positive by IHC   Treatment Plan: 1. Neoadjuvant chemotherapy with TCH Perjeta 6 cycles followed by Herceptin Perjeta maintenance versus Kadcyla maintenance (based on response to neoadjuvant chemo) for 1 year 2. Followed by breast conserving surgery  with sentinel lymph node study 3. Followed by adjuvant radiation therapy   MRI Breast 06/08/21:  Rt Breast cancer 2.8 cm, additional NME total 6.5 cm, Left breast 0.6 cm retroareolar mass, additional indeterminate NME 3.5 cm (Need MRI guided biopsies Rt Br and Left Breast) CT CAP 06/10/21:  Breast masses and Bil Axillary LN, 4 mm Rt LL nodule, tiny hepatic densities 4 mm too small, nodular area inferior bladder (wall thickening vs ext effacement) Bone scan 06/10/21: Benign --------------------------------------------------------------------------------------------------------------- Current Treatment: Cycle 1 day 8 TCHP ECHO EF 60-65%  Chemo Toxicities: 1.  Severe pelvic bone pain: I encouraged her to take Motrin to relieve the pain. 2. diarrhea: 3-4 loose stools per day: She has not taken any Imodium.  She will start taking Imodium as needed 3.  Fatigue  She denied any nausea or vomiting. Labs do not show any evidence of neutropenia therefore we will keep the dosage the same.  RTC in 2 weeks for cycle 2    No orders of the defined types were placed in this encounter.  The patient has a good understanding of the overall plan. she agrees with it. she will call with any problems that may develop before the next visit here. Total time spent: 30 mins including face to face time and time spent for planning, charting and co-ordination of care   Harriette Ohara, MD 06/24/21    I Gardiner Coins am scribing for Dr. Lindi Adie  I have reviewed the above documentation for accuracy and completeness, and I agree with the above.

## 2021-06-11 NOTE — Progress Notes (Signed)
Radiation Oncology         [phone] ________________________________  Outpatient Re-Consultation  Name: Patricia Houston MRN: [id number]  Date: 06/12/2021  DOB: [birth year]  BS:[id number], Mancel Bale, PA  Nicholas Lose, MD   REFERRING PHYSICIAN: Nicholas Lose, MD  DIAGNOSIS:    ICD-10-CM   1. Malignant neoplasm of upper-outer quadrant of right breast in female, estrogen receptor negative ([place])  C50.411    Z17.1       Cancer Staging  Malignant neoplasm of upper-outer quadrant of right breast in female, estrogen receptor negative ([place]) Staging form: Breast, AJCC 8th Edition - Clinical: Stage IIA (cT2, cN0, cM0, G3, ER-, PR-, HER2+) - Signed by Nicholas Lose, MD on 05/27/2021 Stage prefix: Initial diagnosis Histologic grading system: 3 grade system   Stage IIA (cT2, cN0, cM0) Breast UOQ Invasive Ductal Carcinoma, ER- / PR- / Her2+, Grade 3  CHIEF COMPLAINT: Here to discuss management of right breast cancer  HISTORY OF PRESENT ILLNESS::Patricia Houston is a 56 y.o. female who presented with a right breast abnormality on the following imaging: bilateral screening mammogram on the date of 05/01/21.  No symptoms, if any, were reported at that time, however the patient has a history of a right breast papilloma; s/p lumpectomy with Dr. Brantley Stage. She also has a history of atypical ductal hyperplasia which she is followed by Dr. Lindi Adie for.  Right breast diagnostic mammogram and ultrasound on 05/07/21 further revealed a 2.5 cm lobulated mass suspicious for malignancy, located at the prior surgical site. No evidence of axillary adenopathy was appreciated.    Biopsy on date of 05/19/21 showed grade 3 invasive ductal carcinoma measuring 0.4 cm in the greatest linear extent.  ER status: 0% negative; PR status 0% negative; Proliferation marker Ki67 at 60%; Her2 status positive; Grade 3.  Accordingly, the patient was referred to Dr. Brantley Stage on 05/22/21 for further management. During this visit, the  patient was noted to have a large axillary lymph node scheduled for biopsy. The patient endorsed no complaints other than right breast fullness and soreness since her biopsy. Dr. Brantley Stage also discussed the patients case at the [facility] held on 05/20/21 with disposition concluded was for neoadjuvant chemotherapy. The patient also expressed interest in proceeding with breast conserving surgery.   Right axillary lymph node biopsy on 05/25/21 showed no evidence of malignancy.   Accordingly, the patient met with Dr. Lindi Adie on 05/27/21 to discuss systemic treatment.Following review of the risks and benefits, the patient opted to proceed with neoadjuvant chemotherapy with [facility] Perjeta 6 cycles, followed by Herceptin Perjeta maintenance versus Kadcyla maintenance (based on response to neoadjuvant chemo) for 1 year.   Bilateral breast MRI (ordered by Dr. Lindi Adie) on 06/08/21 showed the biopsy-proven malignancy in the central posterior right breast measuring 2.8 x 2.1 x 2.5 cm. An additional subtle non mass enhancement and smaller masses surrounding the main mass were also appreciated,  extending anteriorly towards the nipple. The overall area of masses was appreciated to measure approximately 6.5 x 4.9 cm. MRI also showed an indeterminate 0.6 cm enhancing mass in the retroareolar left breast, as well as an additional indeterminate area of clumped non mass enhancement in the lower slightly outer left breast spanning approximately 3.5 x 2.3 cm.  No abnormal appearing lymph nodes were appreciated.   Other pertinent imaging performed in the interval includes:  -- CT of the chest abdomen and pelvis on 06/09/21 which showed: the patient's known right breast mass-like area, measuring 2.3 cm; irregular mildly enlarged right  axillary lymph nodes suspicious for local nodal disease involvement; nonspecific but prominent left axillary lymph nodes; a solid 4 mm right lower lobe pulmonary nodule (likely infectious or inflammatory in  etiology, though metastatic disease could not be excluded); tiny bilobar hepatic hypodensities measuring up to 4 mm which are likely benign in etiology (again metastatic disease could not be entirely excluded); and a nodular area along the inferior bladder, with differential considerations including wall thickening versus extrinsic effacement of the urinary bladder by the vagina.  -- Whole body bone scan on 06/09/21 demonstrated no scintigraphic evidence of osteoblastic metastatic disease.  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 Fatigue/soreness    SAFETY ISSUES: Prior radiation? No Pacemaker/ICD? No Possible current pregnancy? No--hysterectomy Is the patient on methotrexate? No     PREVIOUS RADIATION THERAPY: No  PAST MEDICAL HISTORY:  has a past medical history of Anemia, Anxiety, Cancer ([place]), and Depression.    PAST SURGICAL HISTORY: Past Surgical History:  Procedure Laterality Date   ABDOMINAL HYSTERECTOMY     BREAST LUMPECTOMY WITH RADIOACTIVE SEED LOCALIZATION Right 06/14/2018   Procedure: RIGHT BREAST RADIOACTIVE SEED LOCALIZATION LUMPECTOMY X2;  Surgeon: Erroll Luna, MD;  Location: [facility];  Service: General;  Laterality: Right;   LAPAROSCOPIC ASSISTED VAGINAL HYSTERECTOMY N/A 02/27/2014   Procedure: LAPAROSCOPIC ASSISTED VAGINAL HYSTERECTOMY;  Surgeon: Maeola Rashad Auld. Landry Mellow, MD;  Location: [facility];  Service: Gynecology;  Laterality: N/A;  abdomen to vagina to abdomen   TONSILLECTOMY      FAMILY HISTORY: family history includes Breast cancer in her cousin and mother; Cancer in an other family member; Heart Problems in her paternal grandmother; Heart attack (age of onset: 31) in her maternal uncle; Heart attack (age of onset: 27) in her maternal grandmother; Leukemia (age of onset:  55) in an other family member; Other in her sister; Pancreatic cancer (age of onset: 51) in her mother; Prostate cancer (age of onset: 36) in her maternal grandfather; Stomach cancer in an other family member.  SOCIAL HISTORY:  reports that she has never smoked. She has never used smokeless tobacco. She reports current alcohol use. She reports that she does not use drugs.  ALLERGIES: Patient has no known allergies.  MEDICATIONS:  Current Outpatient Medications  Medication Sig Dispense Refill   cholecalciferol (VITAMIN D3) 25 MCG (1000 UNIT) tablet Take 1 tablet (1,000 Units total) by mouth daily.     Cyanocobalamin (VITAMIN B-12) 3000 MCG SUBL Place 1 tablet under the tongue daily.     dexamethasone (DECADRON) 4 MG tablet Take 1 tablet (4 mg total) by mouth daily. Take 1 tablet day before chemo and 1 tablet day after chemo with food 12 tablet 0   Fe Cbn-Fe Gluc-FA-B12-C-DSS (FERRALET 90) 90-1 MG TABS Take 1 tablet by mouth 2 (two) times daily.     ferrous sulfate 325 (65 FE) MG tablet 1 tablet     ibuprofen (ADVIL) 800 MG tablet Take 1 tablet (800 mg total) by mouth every 8 (eight) hours as needed. 30 tablet 0   lidocaine-prilocaine (EMLA) cream Apply to affected area once 30 g 3   loratadine (CLARITIN) 10 MG tablet Take 10 mg by mouth daily as needed for allergies.     loratadine (CLARITIN) 10 MG tablet 1 tablet  ondansetron (ZOFRAN) 8 MG tablet Take 1 tablet (8 mg total) by mouth 2 (two) times daily as needed (Nausea or vomiting). Start on the third day after chemotherapy. 30 tablet 1   prochlorperazine (COMPAZINE) 10 MG tablet Take 1 tablet (10 mg total) by mouth every 6 (six) hours as needed (Nausea or vomiting). 30 tablet 1   vitamin B-12 (CYANOCOBALAMIN) 100 MCG tablet See admin instructions.     No current facility-administered medications for this encounter.    REVIEW OF SYSTEMS: As above in HPI.   PHYSICAL EXAM:  height is '5\' 6"'  (1.676 m) and weight is 181 lb 8 oz (82.3 kg).  Her temporal temperature is 96.4 F (35.8 C) (abnormal). Her blood pressure is 119/83 and her pulse is 77. Her respiration is 18 and oxygen saturation is 98%.   General: Alert and oriented, in no acute distress HEENT: Head is normocephalic.  Heart: Regular in rate and rhythm with no murmurs, rubs, or gallops. Chest: Clear to auscultation bilaterally, with no rhonchi, wheezes, or rales. Extremities: No cyanosis or edema. Musculoskeletal: symmetric strength and muscle tone throughout. Neurologic: Cranial nerves II through XII are grossly intact. No obvious focalities. Speech is fluent. Coordination is intact. Psychiatric: Judgment and insight are intact. Affect is appropriate. Breasts: Right breast upper outer quadrant notable for a mass that is approximately 3 cm in greatest dimension. No other palpable masses appreciated in the breasts or axillae bilaterally.    ECOG = 0  0 - Asymptomatic (Fully active, able to carry on all predisease activities without restriction)  1 - Symptomatic but completely ambulatory (Restricted in physically strenuous activity but ambulatory and able to carry out work of a light or sedentary nature. For example, light housework, office work)  2 - Symptomatic, <50% in bed during the day (Ambulatory and capable of all self care but unable to carry out any work activities. Up and about more than 50% of waking hours)  3 - Symptomatic, >50% in bed, but not bedbound (Capable of only limited self-care, confined to bed or chair 50% or more of waking hours)  4 - Bedbound (Completely disabled. Cannot carry on any self-care. Totally confined to bed or chair)  5 - Death   Eustace Pen MM, Creech RH, Tormey DC, et al. [phone]). "Toxicity and response criteria of the [facility] Group". [place] Oncol. 5 (6): 649-55   LABORATORY DATA:  Lab Results  Component Value Date   WBC 5.4 05/27/2021   HGB 14.6 05/27/2021   HCT 44.5 05/27/2021   MCV 86.6 05/27/2021    PLT 213 05/27/2021   CMP     Component Value Date/Time   NA 137 05/27/2021 1244   K 4.3 05/27/2021 1244   CL 102 05/27/2021 1244   CO2 30 05/27/2021 1244   GLUCOSE 93 05/27/2021 1244   BUN 15 05/27/2021 1244   CREATININE 0.96 05/27/2021 1244   CALCIUM 10.4 (H) 05/27/2021 1244   PROT 8.4 (H) 05/27/2021 1244   ALBUMIN 4.6 05/27/2021 1244   AST 13 (L) 05/27/2021 1244   ALT 12 05/27/2021 1244   ALKPHOS 106 05/27/2021 1244   BILITOT 0.5 05/27/2021 1244   GFRNONAA >60 05/27/2021 1244   GFRAA >60 06/12/2018 1100   GFRAA >60 03/22/2018 1242         RADIOGRAPHY: NM Bone Scan Whole Body  Result Date: 06/10/2021 CLINICAL DATA:  Breast cancer, staging. EXAM: NUCLEAR MEDICINE WHOLE BODY BONE SCAN TECHNIQUE: Whole body anterior and posterior images were obtained  approximately 3 hours after intravenous injection of radiopharmaceutical. RADIOPHARMACEUTICALS:  19.2 mCi Technetium-27mMDP IV COMPARISON:  None Available. FINDINGS: No scintigraphic evidence of abnormal radiotracer uptake to suggest osteoblastic metastatic disease. Multifocal radiotracer uptake in a pattern most consistent with degenerative arthropathy. Otherwise physiologic distribution of radiotracer activity. IMPRESSION: No scintigraphic evidence of osteoblastic metastatic disease. Electronically Signed   By: JDahlia BailiffM.D.   On: 06/10/2021 07:12   CT CHEST ABDOMEN PELVIS W CONTRAST  Result Date: 06/10/2021 CLINICAL DATA:  Right breast cancer, staging.  * Tracking Code: BO * EXAM: CT CHEST, ABDOMEN, AND PELVIS WITH CONTRAST TECHNIQUE: Multidetector CT imaging of the chest, abdomen and pelvis was performed following the standard protocol during bolus administration of intravenous contrast. RADIATION DOSE REDUCTION: This exam was performed according to the departmental dose-optimization program which includes automated exposure control, adjustment of the mA and/or kV according to patient size and/or use of iterative reconstruction  technique. CONTRAST:  1049mOMNIPAQUE IOHEXOL 300 MG/ML  SOLN COMPARISON:  None Available. FINDINGS: CT CHEST FINDINGS Cardiovascular: Normal caliber thoracic aorta. No central pulmonary embolus on this nondedicated study. Normal size heart. No significant pericardial effusion/thickening. Mediastinum/Nodes: No supraclavicular adenopathy. No suspicious thyroid nodule. No pathologically enlarged mediastinal or hilar lymph nodes. Esophagus is grossly unremarkable. Right axillary surgical clips. Enlarged right axillary lymph nodes measure up to 11 mm in short axis on image 18/2. Prominent left axillary lymph nodes measure up to 6 mm in short axis but appear to maintain their normal reniform architecture. Lungs/Pleura: Solid 4 mm right lower lobe pulmonary nodule on image 71/4. No focal airspace consolidation. No pleural effusion. No pneumothorax. Musculoskeletal: No aggressive lytic or blastic lesion of bone. Surgical clips in the right breast adjacent to a masslike area measuring 2.3 cm on image 26/2. CT ABDOMEN PELVIS FINDINGS Hepatobiliary: Tiny bilobar hypodensities for instance in the left lobe of the liver on image 51/2 measuring 4 mm in the peripheral right lobe of the liver on image 53/2 measuring 4 mm are technically too small to accurately characterize. Gallbladder is unremarkable. No biliary ductal dilation. Pancreas: No pancreatic ductal dilation or evidence of acute inflammation. Spleen: No splenomegaly or focal splenic lesion. Adrenals/Urinary Tract: Bilateral adrenal glands appear normal. No hydronephrosis. No suspicious renal mass. Nodular area along the inferior bladder wall on sagittal image 117/6 and coronal image 86/5 may reflect focal wall thickening versus extrinsic effacement of the urinary bladder by the vagina. Stomach/Bowel: Radiopaque enteric contrast material traverses the hepatic flexure. Small hiatal hernia otherwise the stomach is unremarkable for degree of distension. No pathologic  dilation of small or large bowel. The appendix and terminal ileum appear normal. Moderate volume of formed stool throughout the colon suggestive of constipation. Redundant sigmoid colon. No evidence of acute bowel inflammation. Vascular/Lymphatic: Normal caliber abdominal aorta. No pathologically enlarged abdominal or pelvic lymph nodes. Reproductive: Status post hysterectomy. No adnexal masses. Other: No significant abdominopelvic free fluid. Musculoskeletal: No aggressive lytic or blastic lesion of bone. IMPRESSION: 1. Surgical clips in the right breast adjacent to a masslike 2.3 cm area, likely reflecting patient's known primary breast neoplasm. 2. Irregular mildly enlarged right axillary lymph nodes are suspicious for local nodal disease involvement. 3. Prominent left axillary lymph nodes are nonspecific, consider further evaluation with dedicated ultrasound and possible FNA. 4. Solid 4 mm right lower lobe pulmonary nodule is favored to reflect sequela of infection/inflammation. However metastatic disease is not technically excluded consider attention on short-term interval follow-up imaging. 5. Tiny bilobar hepatic hypodensities measure up  to 4 mm and are technically too small to accurately characterize. While likely to reflect a benign etiology such as cysts or hemangiomas, metastatic disease is not technically excluded consider attention on short-term interval follow-up imaging. 6. Nodular area along the inferior bladder wall may reflect focal wall thickening versus extrinsic effacement of the urinary bladder by the vagina, recommend correlation with history of hematuria and possible cystoscopy if clinically indicated. Electronically Signed   By: Dahlia Bailiff M.D.   On: 06/10/2021 07:10   MR BREAST BILATERAL W WO CONTRAST INC CAD  Result Date: 06/08/2021 CLINICAL DATA:  56 year old female presenting for or staging evaluation. History of right breast excision demonstrating intraductal papilloma in 2020.  Family history of breast cancer in the patient's mother. EXAM: BILATERAL BREAST MRI WITH AND WITHOUT CONTRAST TECHNIQUE: Multiplanar, multisequence MR images of both breasts were obtained prior to and following the intravenous administration of 8 ml of Gadavist Three-dimensional MR images were rendered by post-processing of the original MR data on an independent workstation. The three-dimensional MR images were interpreted, and findings are reported in the following complete MRI report for this study. Three dimensional images were evaluated at the independent interpreting workstation using the DynaCAD thin client. COMPARISON:  None Available. FINDINGS: Breast composition: c. Heterogeneous fibroglandular tissue. Background parenchymal enhancement: Moderate. Right breast: There is an irregular heterogeneously enhancing mass in the central posterior right breast consistent with biopsy proven malignancy. The mass measures 2.8 x 2.1 x 2.5 cm (series 6, image 74). There is additional subtle non mass enhancement and small masses surrounding the main mass in extending anteriorly towards the nipple. The overall area of abnormality measures approximately 6.5 x 4.9 cm. A representative satellite medial to the dominant mass measures 6 mm (series 6, image 66). Left breast: There is a small enhancing mass in the retroareolar left breast measuring 0.6 cm (series 6, image 78). There is an additional area of clumped non mass enhancement in the lower slightly outer left breast spanning approximately 3.5 x 2.3 cm (series 9, image 86). Lymph nodes: No abnormal appearing lymph nodes. There is susceptibility artifact in a right axillary lymph node consistent with a biopsy marking clip. Ancillary findings:  None. IMPRESSION: 1. Biopsy-proven malignancy in the central posterior right breast measuring 2.8 x 2.1 x 2.5 cm. There is additional subtle non mass enhancement and small masses surrounding the main mass extending anteriorly towards  the nipple. The overall area of abnormality measures approximately 6.5 x 4.9 cm. 2. Indeterminate 0.6 cm enhancing mass in the retroareolar left breast. 3. Additional indeterminate area of clumped non mass enhancement in the lower slightly outer left breast spanning approximately 3.5 x 2.3 cm. RECOMMENDATION: 1. If breast conservation is desired recommend additional MRI guided biopsies x2 for the right breast targeting the medial dominant satellite lesion and the non mass enhancement in the retroareolar aspect. 2. MRI guided biopsy x 2 of the left breast for the small retroareolar mass and clumped non mass enhancement. 3.  Schedule right and left breast on separate days. BI-RADS CATEGORY  4: Suspicious. Electronically Signed   By: Audie Pinto M.D.   On: 06/08/2021 14:08  ECHOCARDIOGRAM COMPLETE  Result Date: 06/04/2021    ECHOCARDIOGRAM REPORT   Patient Name:   Patricia Houston Date of Exam: 06/04/2021 Medical Rec #:  [id number]          Height:       66.0 in Accession #:    [id number]  Weight:       182.8 lb Date of Birth:  12-12-1965         BSA:          1.925 m Patient Age:    54 years           BP:           127/93 mmHg Patient Gender: F                  HR:           73 bpm. Exam Location:  Outpatient Procedure: 2D Echo, Cardiac Doppler, Color Doppler, 3D Echo and Strain Analysis Indications:    Chemo Z09  History:        Patient has no prior history of Echocardiogram examinations.  Sonographer:    Mikki Santee RDCS Referring Phys: [id number] Nicholas Lose IMPRESSIONS  1. Left ventricular ejection fraction, by estimation, is 60 to 65%. The left ventricle has normal function. The left ventricle has no regional wall motion abnormalities. Left ventricular diastolic parameters were normal.  2. Right ventricular systolic function is normal. The right ventricular size is normal. Tricuspid regurgitation signal is inadequate for assessing PA pressure.  3. The mitral valve is normal in structure. No  evidence of mitral valve regurgitation.  4. The aortic valve is normal in structure. Aortic valve regurgitation is not visualized.  5. The inferior vena cava is normal in size with <50% respiratory variability, suggesting right atrial pressure of 8 mmHg. Comparison(s): No prior Echocardiogram. Conclusion(s)/Recommendation(s): Normal biventricular function without evidence of hemodynamically significant valvular heart disease. FINDINGS  Left Ventricle: Left ventricular ejection fraction, by estimation, is 60 to 65%. The left ventricle has normal function. The left ventricle has no regional wall motion abnormalities. The left ventricular internal cavity size was normal in size. There is  no left ventricular hypertrophy. Left ventricular diastolic parameters were normal. Right Ventricle: The right ventricular size is normal. No increase in right ventricular wall thickness. Right ventricular systolic function is normal. Tricuspid regurgitation signal is inadequate for assessing PA pressure. Left Atrium: Left atrial size was normal in size. Right Atrium: Right atrial size was normal in size. Pericardium: There is no evidence of pericardial effusion. Mitral Valve: The mitral valve is normal in structure. No evidence of mitral valve regurgitation. Tricuspid Valve: The tricuspid valve is normal in structure. Tricuspid valve regurgitation is not demonstrated. Aortic Valve: The aortic valve is normal in structure. Aortic valve regurgitation is not visualized. Pulmonic Valve: The pulmonic valve was normal in structure. Pulmonic valve regurgitation is not visualized. Aorta: The aortic root and ascending aorta are structurally normal, with no evidence of dilitation. Venous: The inferior vena cava is normal in size with less than 50% respiratory variability, suggesting right atrial pressure of 8 mmHg. IAS/Shunts: The atrial septum is grossly normal.  LEFT VENTRICLE PLAX 2D LVIDd:         4.50 cm   Diastology LVIDs:         2.60  cm   LV e' medial:    9.90 cm/s LV PW:         0.80 cm   LV E/e' medial:  7.9 LV IVS:        0.70 cm   LV e' lateral:   10.60 cm/s LVOT diam:     1.90 cm   LV E/e' lateral: 7.4 LV SV:         51 LV SV Index:   27  2D Longitudinal Strain LVOT Area:     2.84 cm  2D Strain GLS Avg:     -22.0 %                           3D Volume EF:                          3D EF:        61 %                          LV EDV:       120 ml                          LV ESV:       46 ml                          LV SV:        73 ml RIGHT VENTRICLE RV Basal diam:  2.90 cm RV S prime:     8.78 cm/s TAPSE (M-mode): 2.0 cm LEFT ATRIUM             Index        RIGHT ATRIUM           Index LA diam:        3.40 cm 1.77 cm/m   RA Area:     10.10 cm LA Vol (A2C):   37.8 ml 19.64 ml/m  RA Volume:   21.40 ml  11.12 ml/m LA Vol (A4C):   36.8 ml 19.12 ml/m LA Biplane Vol: 40.4 ml 20.99 ml/m  AORTIC VALVE LVOT Vmax:   101.00 cm/s LVOT Vmean:  60.300 cm/s LVOT VTI:    0.181 m  AORTA Ao Root diam: 2.60 cm Ao Asc diam:  2.80 cm MITRAL VALVE MV Area (PHT): 2.96 cm    SHUNTS MV Decel Time: 256 msec    Systemic VTI:  0.18 m MV E velocity: 78.60 cm/s  Systemic Diam: 1.90 cm MV A velocity: 54.50 cm/s MV E/A ratio:  1.44 Landscape architect signed by Phineas Inches Signature Date/Time: 06/04/2021/12:20:28 PM    Final       IMPRESSION/PLAN: This is a very nice 33 year old patient whom I met in 2020 - at that time it was initially thought that she had right breast DCIS but it was later determined on pathology to be a benign papilloma.     Unfortunately, 3 years later, she has been diagnosed with ER-negative right breast cancer.  She recently completed staging scans.  Her bone scan is negative and her CT imaging does not show any obvious evidence of metastatic disease.  Medical oncology plans to follow nonspecific lesions in her lungs and liver.  She does have other findings for which she has been scheduled for other biopsies.  I sent a note to  the team to clarify where these other biopsies will be performed.  It looks like she may benefit from additional biopsies in the contralateral left axilla, and possibly the left breast.  She has already had a negative biopsy in the right axilla but I am not sure if this additional imaging warrants further evaluation of the right axilla.  I recommended that she be added to tumor board next week for discussion before her biopsies occur.  She anticipates starting chemotherapy later this month.  She then is hopeful to undergo breast conserving surgery.  I let her know that she will benefit from adjuvant radiation therapy after she is healed from chemotherapy and surgery.  We discussed the risks, benefits, and side effects of radiotherapy. I recommend radiotherapy to the right breast and (possibly the regional lymph nodes depending on final staging ) to reduce her risk of locoregional recurrence by 2/3.  We discussed that radiation would take approximately 4-6 weeks to complete and that I would give the patient a few weeks to heal following surgery before starting treatment planning.  We spoke about acute effects including skin irritation and fatigue as well as much less common late effects including internal organ injury or irritation. We spoke about the latest technology that is used to minimize the risk of late effects for patients undergoing radiotherapy to the breast or chest wall. No guarantees of treatment were given. The patient is enthusiastic about proceeding with treatment. I look forward to participating in the patient's care.  I will await her referral back to me for postoperative follow-up and eventual CT simulation/treatment planning.  On date of service, in total, I spent 45 minutes on this encounter. Patient was seen in person.   __________________________________________   Eppie Gibson, MD  This document serves as a record of services personally performed by Eppie Gibson, MD. It was created  on her behalf by Roney Mans, a trained medical scribe. The creation of this record is based on the scribe's personal observations and the provider's statements to them. This document has been checked and approved by the attending provider.

## 2021-06-11 NOTE — Progress Notes (Signed)
Location of Breast Cancer:  Malignant neoplasm of upper-outer quadrant of right breast in female, estrogen receptor negative  Histology per Pathology Report:  05/19/2021 Diagnosis Breast, right, needle core biopsy, 11:00-12:00, 2-3 cmfn - INVASIVE DUCTAL CARCINOMA WITH NECROSIS - SEE COMMENT Microscopic Comment Based on the biopsy, the carcinoma appears Nottingham grade 3 of 3 and measures 0.4 cm in greatest linear extent.  Receptor Status: ER(0%), PR (0%), Her2-neu (Positive), Ki-67(60%)  Did patient present with symptoms (if so, please note symptoms) or was this found on screening mammography?: (per Dr. Cornett's 05/22/21 note): "history of atypical ductal hyperplasia on the right and had a lumpectomy 2 years ago. She developed more thickening of the right breast recently and ultrasound and mammography revealed a 2.2 cm mass...She also has a large lymph node scheduled for lymph node biopsy."  Breast MRI w/ & w/o Contrast 06/08/2021 --IMPRESSION: Biopsy-proven malignancy in the central posterior right breast measuring 2.8 x 2.1 x 2.5 cm. There is additional subtle non mass enhancement and small masses surrounding the main mass extending anteriorly towards the nipple. The overall area of abnormality measures approximately 6.5 x 4.9 cm. Indeterminate 0.6 cm enhancing mass in the retroareolar left breast. Additional indeterminate area of clumped non mass enhancement in the lower slightly outer left breast spanning approximately 3.5 x 2.3 cm.  Past/Anticipated interventions by surgeon, if any:  05/22/2021 --Dr. Thomas Cornett (office visit) More likely she will benefit from chemotherapy and this can be done in the neoadjuvant setting.  I discussed port placement and the pros and cons of that as well as complications of port placement.  She would like to conserve her breast therefore we discussed breast conserving surgery at the appropriate time and she is to undergo biopsy of the right  axillar lymph node which appears abnormal as well.  She will see Dr. Godina next week to discuss chemotherapy more and I discussed port placement with her as an outpatient.  She will need an MRI as well depending on the conversation she had with Dr. Godina.  Past/Anticipated interventions by medical oncology, if any:  Under care of Dr. Vinay Gudena 05/27/2021 --Recommendation based on multidisciplinary tumor board: Neoadjuvant chemotherapy with TCH Perjeta 6 cycles followed by Herceptin Perjeta maintenance versus Kadcyla maintenance (based on response to neoadjuvant chemo) for 1 year Followed by breast conserving surgery  with sentinel lymph node study Followed by adjuvant radiation therapy --Plan: 1. Port placement 2. Echocardiogram 3. Chemotherapy class 4. Breast MRI --Return to clinic in 2 weeks to start chemotherapy  Lymphedema issues, if any:  Patient denies    Pain issues, if any:  Reports occasional sharp pains and a pulling sensation. Fatigue/soreness    SAFETY ISSUES: Prior radiation? No Pacemaker/ICD? No Possible current pregnancy? No--hysterectomy Is the patient on methotrexate? No  Current Complaints / other details:  Scheduled for PT assessment later this morning        

## 2021-06-12 ENCOUNTER — Inpatient Hospital Stay (HOSPITAL_BASED_OUTPATIENT_CLINIC_OR_DEPARTMENT_OTHER): Payer: Federal, State, Local not specified - PPO

## 2021-06-12 ENCOUNTER — Telehealth: Payer: Self-pay

## 2021-06-12 ENCOUNTER — Ambulatory Visit (HOSPITAL_COMMUNITY)
Admission: RE | Admit: 2021-06-12 | Payer: Federal, State, Local not specified - PPO | Source: Home / Self Care | Admitting: Surgery

## 2021-06-12 ENCOUNTER — Ambulatory Visit: Payer: Federal, State, Local not specified - PPO | Attending: Hematology and Oncology | Admitting: Rehabilitation

## 2021-06-12 ENCOUNTER — Encounter: Payer: Self-pay | Admitting: Rehabilitation

## 2021-06-12 ENCOUNTER — Other Ambulatory Visit: Payer: Self-pay

## 2021-06-12 ENCOUNTER — Ambulatory Visit
Admission: RE | Admit: 2021-06-12 | Discharge: 2021-06-12 | Disposition: A | Discharge status: Home / Self Care | Payer: Federal, State, Local not specified - PPO | Source: Ambulatory Visit | Attending: Radiation Oncology | Admitting: Radiation Oncology

## 2021-06-12 ENCOUNTER — Encounter (HOSPITAL_COMMUNITY): Admission: RE | Payer: Self-pay | Source: Home / Self Care

## 2021-06-12 ENCOUNTER — Ambulatory Visit: Payer: Federal, State, Local not specified - PPO

## 2021-06-12 ENCOUNTER — Encounter: Payer: Self-pay | Admitting: *Deleted

## 2021-06-12 ENCOUNTER — Encounter: Payer: Self-pay | Admitting: Radiation Oncology

## 2021-06-12 ENCOUNTER — Ambulatory Visit: Payer: Federal, State, Local not specified - PPO | Admitting: Radiation Oncology

## 2021-06-12 VITALS — BP 119/83 | HR 77 | Temp 96.4°F | Resp 18 | Ht 66.0 in | Wt 181.5 lb

## 2021-06-12 DIAGNOSIS — Z806 Family history of leukemia: Secondary | ICD-10-CM | POA: Insufficient documentation

## 2021-06-12 DIAGNOSIS — R293 Abnormal posture: Secondary | ICD-10-CM | POA: Diagnosis not present

## 2021-06-12 DIAGNOSIS — Q438 Other specified congenital malformations of intestine: Secondary | ICD-10-CM | POA: Insufficient documentation

## 2021-06-12 DIAGNOSIS — C50411 Malignant neoplasm of upper-outer quadrant of right female breast: Secondary | ICD-10-CM | POA: Diagnosis not present

## 2021-06-12 DIAGNOSIS — Z171 Estrogen receptor negative status [ER-]: Secondary | ICD-10-CM | POA: Insufficient documentation

## 2021-06-12 DIAGNOSIS — R5383 Other fatigue: Secondary | ICD-10-CM | POA: Insufficient documentation

## 2021-06-12 DIAGNOSIS — Z7952 Long term (current) use of systemic steroids: Secondary | ICD-10-CM | POA: Insufficient documentation

## 2021-06-12 DIAGNOSIS — Z803 Family history of malignant neoplasm of breast: Secondary | ICD-10-CM | POA: Diagnosis not present

## 2021-06-12 SURGERY — INSERTION, TUNNELED CENTRAL VENOUS DEVICE, WITH PORT
Anesthesia: General

## 2021-06-12 NOTE — Telephone Encounter (Signed)
-----   Message from Eppie Gibson, MD sent at 06/12/2021 10:12 AM EDT ----- Thanks! Opel Lejeune, she will appreciate a call whenever you have a chance to let her know. ----- Message ----- From: Katheren Shams, Counselor Sent: 06/12/2021  10:01 AM EDT To: Eppie Gibson, MD; Nicholas Lose, MD; #  Hi,  She had very comprehensive testing in 2020 (85 genes). Therefore, we wouldn't recommend any additional genetic testing at this time.   Thanks! Mel Almond  ----- Message ----- From: Eppie Gibson, MD Sent: 06/12/2021   8:52 AM EDT To: Nicholas Lose, MD; Zola Button, RN; #  Mel Almond, this pt had a neg genetic panel in 2020 - anything you could offer her now, three years later? It looks like she was referred but then it was cancelled. She is just wondering. Megan Presti, can you let her know what Mel Almond says? Thanks! Judson Roch

## 2021-06-12 NOTE — Telephone Encounter (Signed)
Called and spoke with patient to relay update regarding no need for additional genetic testing per genetic counselor. Patient verbalized understanding and appreciation of call. No other needs identified, but patient knows to call me back as needed should something arise.

## 2021-06-12 NOTE — Therapy (Unsigned)
OUTPATIENT PHYSICAL THERAPY BREAST CANCER BASELINE EVALUATION   Patient Name: Patricia Houston MRN: [id number] DOB:08/01/1965, 56 y.o., female Today's Date: 06/12/2021   PT End of Session - 06/12/21 0914     Visit Number 1    Number of Visits 2             Past Medical History:  Diagnosis Date   Anemia    TAKES IRON PILLS   Anxiety    Cancer ([place])    right breast DCIS   Depression    Past Surgical History:  Procedure Laterality Date   ABDOMINAL HYSTERECTOMY     BREAST LUMPECTOMY WITH RADIOACTIVE SEED LOCALIZATION Right 06/14/2018   Procedure: RIGHT BREAST RADIOACTIVE SEED LOCALIZATION LUMPECTOMY X2;  Surgeon: Erroll Luna, MD;  Location: [facility];  Service: General;  Laterality: Right;   LAPAROSCOPIC ASSISTED VAGINAL HYSTERECTOMY N/A 02/27/2014   Procedure: LAPAROSCOPIC ASSISTED VAGINAL HYSTERECTOMY;  Surgeon: Maeola Sarah. Landry Mellow, MD;  Location: [facility];  Service: Gynecology;  Laterality: N/A;  abdomen to vagina to abdomen   TONSILLECTOMY     Patient Active Problem List   Diagnosis Date Noted   Malignant neoplasm of upper-outer quadrant of right breast in female, estrogen receptor negative ([place]) 05/27/2021   Atypical ductal hyperplasia of right breast 07/18/2018   Genetic testing 03/31/2018   Family history of breast cancer in mother 04/03/2015   Family history of pancreatic cancer 04/03/2015   S/P hysterectomy 02/27/2014   ANEMIA 07/01/2009    PCP: Marilynne Drivers PA  REFERRING PROVIDER: Dr. Lindi Adie  REFERRING DIAG: Rt breast cancer  THERAPY DIAG:  Malignant neoplasm of upper-outer quadrant of right breast in female, estrogen receptor negative ([place])  Abnormal posture  Rationale for Evaluation and Treatment Rehabilitation  ONSET DATE: 05/19/21  SUBJECTIVE                                                                                                                                                                                            SUBJECTIVE STATEMENT: Patient reports she is here today to be seen by her medical team for her newly diagnosed right breast cancer.  I have been having some stiffness and discomfort on the Rt shoulder 4-6 months and with the neck.  If I do more with my left it gets more stiff.    PERTINENT HISTORY:  Breast cancer recurrence noted 05/19/21. Hx of Rt lumpectomy x 2 in 2010 with Dr. Brantley Stage.  0/1 lymph node removed. Plan is for neoadjuvant chemotherapy TCHP followed by lumpectomy and SLNB and radiation.   PATIENT GOALS   reduce lymphedema risk and learn post op HEP.  PAIN:  Are you having pain? No it is just stiff feeling  PRECAUTIONS: Active CA   HAND DOMINANCE: left  WEIGHT BEARING RESTRICTIONS No  FALLS:  Has patient fallen in last 6 months? No  LIVING ENVIRONMENT: Patient lives with: husband  OCCUPATION: desk job  LEISURE: ride bike, trying hand weights, have a total gym but it has been a few months  PRIOR LEVEL OF FUNCTION: Independent   OBJECTIVE COGNITION:  Overall cognitive status: Within functional limits for tasks assessed    POSTURE:  Forward head and rounded shoulders posture  UPPER EXTREMITY AROM/PROM:  A/PROM RIGHT   eval   Shoulder extension 60  Shoulder flexion 150  Shoulder abduction 165 - top of the shoulder and upper arm feels tight  Shoulder internal rotation   Shoulder external rotation 91    (Blank rows = not tested)  A/PROM LEFT   eval  Shoulder extension 60  Shoulder flexion 150  Shoulder abduction 165  Shoulder internal rotation   Shoulder external rotation     (Blank rows = not tested)  SHOULDER STRENGTH: Flexion: 5/5 bil  Abduction: 4+/5 bil with some pain on the top of the Rt shoulder ER: 5/5 IR:5/5  CERVICAL AROM: All within normal limits:   LYMPHEDEMA ASSESSMENTS:   LANDMARK RIGHT   eval  10 cm proximal to olecranon process 33.5  Olecranon process 27  10 cm proximal to ulnar styloid process 22  Just proximal to  ulnar styloid process 15.7  Across hand at thumb web space 18.5  At base of 2nd digit 5.9  (Blank rows = not tested)  LANDMARK LEFT   eval  10 cm proximal to olecranon process 33.5  Olecranon process 27  10 cm proximal to ulnar styloid process 22  Just proximal to ulnar styloid process 15.4  Across hand at thumb web space 18.7  At base of 2nd digit 6.0  (Blank rows = not tested)   L-DEX LYMPHEDEMA SCREENING: The patient was assessed using the L-Dex machine today to produce a lymphedema index baseline score. The patient will be reassessed on a regular basis (typically every 3 months) to obtain new L-Dex scores. If the score is > 6.5 points away from his/her baseline score indicating onset of subclinical lymphedema, it will be recommended to wear a compression garment for 4 weeks, 12 hours per day and then be reassessed. If the score continues to be > 6.5 points from baseline at reassessment, we will initiate lymphedema treatment. Assessing in this manner has a 95% rate of preventing clinically significant lymphedema.  QUICK DASH SURVEY:  PATIENT EDUCATION:  Education details: Lymphedema risk reduction and post op shoulder/posture HEP Person educated: Patient Education method: Explanation, Demonstration, Handout Education comprehension: Patient verbalized understanding and returned demonstration  HOME EXERCISE PROGRAM: Patient was instructed today in a home exercise program today for post op shoulder range of motion. These included active assist shoulder flexion in sitting, scapular retraction, wall walking with shoulder abduction, and hands behind head external rotation.  She was encouraged to do these twice a day, holding 3 seconds and repeating 5 times when permitted by her physician.   ASSESSMENT:  CLINICAL IMPRESSION: ***Her multidisciplinary medical team met prior to her assessments to determine a recommended treatment plan. She is planning to have ***. She will benefit from a  post op PT reassessment to determine needs and from L-Dex screens every 3 months for 2 years to detect subclinical lymphedema.  Pt will benefit from skilled therapeutic intervention to  improve on the following deficits: Decreased knowledge of precautions, impaired UE functional use, pain, decreased ROM, postural dysfunction.   PT treatment/interventions: ADL/self-care home management, pt/family education, therapeutic exercise  REHAB POTENTIAL: {rehabpotential:25112}  CLINICAL DECISION MAKING: {clinical decision making:25114}  EVALUATION COMPLEXITY: {Evaluation complexity:25115}   GOALS: Goals reviewed with patient? YES  LONG TERM GOALS: (STG=LTG)    Name Target Date Goal status  1 Pt will be able to verbalize understanding of pertinent lymphedema risk reduction practices relevant to her dx specifically related to skin care.  Baseline:  No knowledge 06/12/2021 Achieved at eval  2 Pt will be able to return demo and/or verbalize understanding of the post op HEP related to regaining shoulder ROM. Baseline:  No knowledge 06/12/2021 Achieved at eval  3 Pt will be able to verbalize understanding of the importance of attending the post op After Breast CA Class for further lymphedema risk reduction education and therapeutic exercise.  Baseline:  No knowledge 06/12/2021 Achieved at eval  4 Pt will demo she has regained full shoulder ROM and function post operatively compared to baselines.  Baseline: See objective measurements taken today. ***      PLAN: PT FREQUENCY/DURATION: EVAL and 1 follow up appointment.   PLAN FOR NEXT SESSION: will reassess 3-4 weeks post op to determine needs.   Patient will follow up at outpatient cancer rehab 3-4 weeks following surgery.  If the patient requires physical therapy at that time, a specific plan will be dictated and sent to the referring physician for approval. The patient was educated today on appropriate basic range of motion exercises to begin post  operatively and the importance of attending the After Breast Cancer class following surgery.  Patient was educated today on lymphedema risk reduction practices as it pertains to recommendations that will benefit the patient immediately following surgery.  She verbalized good understanding.    Physical Therapy Information for After Breast Cancer Surgery/Treatment:  Lymphedema is a swelling condition that you may be at risk for in your arm if you have lymph nodes removed from the armpit area.  After a sentinel node biopsy, the risk is approximately 5-9% and is higher after an axillary node dissection.  There is treatment available for this condition and it is not life-threatening.  Contact your physician or physical therapist with concerns. You may begin the 4 shoulder/posture exercises (see additional sheet) when permitted by your physician (typically a week after surgery).  If you have drains, you may need to wait until those are removed before beginning range of motion exercises.  A general recommendation is to not lift your arms above shoulder height until drains are removed.  These exercises should be done to your tolerance and gently.  This is not a "no pain/no gain" type of recovery so listen to your body and stretch into the range of motion that you can tolerate, stopping if you have pain.  If you are having immediate reconstruction, ask your plastic surgeon about doing exercises as he or she may want you to wait. We encourage you to attend the free one time ABC (After Breast Cancer) class offered by [facility].  You will learn information related to lymphedema risk, prevention and treatment and additional exercises to regain mobility following surgery.  You can call [phone] for more information.  This is offered the 1st and 3rd Monday of each month.  You only attend the class one time. While undergoing any medical procedure or treatment, try to avoid blood pressure  being  taken or needle sticks from occurring on the arm on the side of cancer.   This recommendation begins after surgery and continues for the rest of your life.  This may help reduce your risk of getting lymphedema (swelling in your arm). An excellent resource for those seeking information on lymphedema is the National Lymphedema Network's web site. It can be accessed at [place].org If you notice swelling in your hand, arm or breast at any time following surgery (even if it is many years from now), please contact your doctor or physical therapist to discuss this.  Lymphedema can be treated at any time but it is easier for you if it is treated early on.  If you feel like your shoulder motion is not returning to normal in a reasonable amount of time, please contact your surgeon or physical therapist.  [facility] [phone]. [address], [address], [place] [address]  ABC CLASS After Breast Cancer Class  After Breast Cancer Class is a specially designed exercise class to assist you in a safe recover after having breast cancer surgery.  In this class you will learn how to get back to full function whether your drains were just removed or if you had surgery a month ago.  This one-time class is held the 1st and 3rd Monday of every month from 11:00 a.m. until 12:00 noon virtually.  This class is FREE and space is limited. For more information or to register for the next available class, call [phone].  Class Goals  Understand specific stretches to improve the flexibility of you chest and shoulder. Learn ways to safely strengthen your upper body and improve your posture. Understand the warning signs of infection and why you may be at risk for an arm infection. Learn about Lymphedema and prevention.  ** You do not attend this class until after surgery.  Drains must be removed to participate  Patient was instructed today in a home exercise program today for  post op shoulder range of motion. These included active assist shoulder flexion in sitting, scapular retraction, wall walking with shoulder abduction, and hands behind head external rotation.  She was encouraged to do these twice a day, holding 3 seconds and repeating 5 times when permitted by her physician.    Stark Bray, PT 06/12/2021, 9:15 AM

## 2021-06-16 ENCOUNTER — Other Ambulatory Visit: Payer: Self-pay | Admitting: Radiology

## 2021-06-17 ENCOUNTER — Encounter (HOSPITAL_COMMUNITY): Payer: Self-pay

## 2021-06-17 ENCOUNTER — Other Ambulatory Visit: Payer: Self-pay | Admitting: *Deleted

## 2021-06-17 ENCOUNTER — Ambulatory Visit (HOSPITAL_COMMUNITY)
Admission: RE | Admit: 2021-06-17 | Discharge: 2021-06-17 | Disposition: A | Discharge status: Home / Self Care | Payer: Federal, State, Local not specified - PPO | Source: Ambulatory Visit | Attending: Hematology and Oncology | Admitting: Hematology and Oncology

## 2021-06-17 ENCOUNTER — Other Ambulatory Visit: Payer: Self-pay | Admitting: Hematology and Oncology

## 2021-06-17 ENCOUNTER — Encounter: Payer: Self-pay | Admitting: Hematology and Oncology

## 2021-06-17 ENCOUNTER — Encounter: Payer: Self-pay | Admitting: *Deleted

## 2021-06-17 DIAGNOSIS — Z006 Encounter for examination for normal comparison and control in clinical research program: Secondary | ICD-10-CM

## 2021-06-17 DIAGNOSIS — Z452 Encounter for adjustment and management of vascular access device: Secondary | ICD-10-CM | POA: Diagnosis not present

## 2021-06-17 DIAGNOSIS — Z171 Estrogen receptor negative status [ER-]: Secondary | ICD-10-CM

## 2021-06-17 DIAGNOSIS — F32A Depression, unspecified: Secondary | ICD-10-CM | POA: Insufficient documentation

## 2021-06-17 DIAGNOSIS — F419 Anxiety disorder, unspecified: Secondary | ICD-10-CM | POA: Diagnosis not present

## 2021-06-17 DIAGNOSIS — C50411 Malignant neoplasm of upper-outer quadrant of right female breast: Secondary | ICD-10-CM | POA: Diagnosis not present

## 2021-06-17 DIAGNOSIS — C50911 Malignant neoplasm of unspecified site of right female breast: Secondary | ICD-10-CM | POA: Diagnosis not present

## 2021-06-17 HISTORY — PX: IR IMAGING GUIDED PORT INSERTION: IMG5740

## 2021-06-17 IMAGING — XA IR IMAGING GUIDED PORT INSERTION
1 series · 1 of 1 positions shown · non-contrast
Comparison: None Available.

INDICATION: 55-year-old female with history of right breast cancer requiring
central venous access for chemotherapy.

EXAM:
IMPLANTED PORT A CATH PLACEMENT WITH ULTRASOUND AND FLUOROSCOPIC
GUIDANCE

[Series 1: ir fluoro/shunt/fist · 1 of 1 slices shown]
[im 1/1]
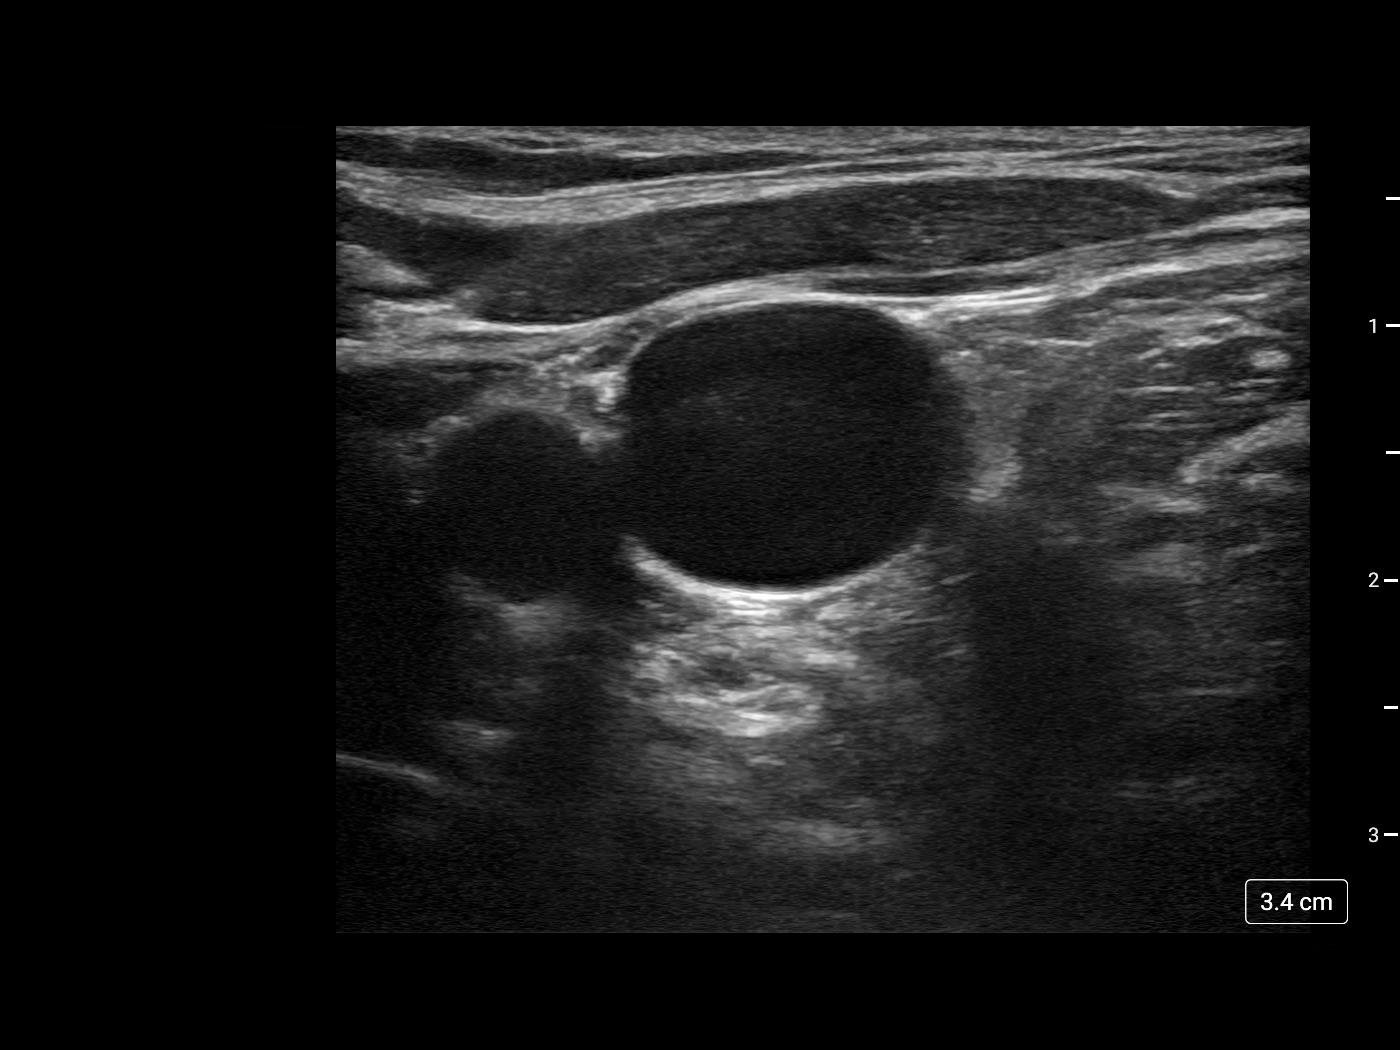

[1 of 1 positions shown; findings below may reference images not displayed]

MEDICATIONS:
None.

ANESTHESIA/SEDATION:
Moderate (conscious) sedation was employed during this procedure. A
total of Versed 2 mg and Fentanyl 100 mcg was administered
intravenously.

Moderate Sedation Time: 25 minutes. The patient's level of
consciousness and vital signs were monitored continuously by
radiology nursing throughout the procedure under my direct
supervision.

CONTRAST:  None

FLUOROSCOPY TIME:  0 minutes (0 mGy)

COMPLICATIONS:
None immediate.

PROCEDURE:
The procedure, risks, benefits, and alternatives were explained to
the patient. Questions regarding the procedure were encouraged and
answered. The patient understands and consents to the procedure.

The left neck and chest were prepped with chlorhexidine in a sterile
fashion, and a sterile drape was applied covering the operative
field. Maximum barrier sterile technique with sterile gowns and
gloves were used for the procedure. A timeout was performed prior to
the initiation of the procedure.

Ultrasound was used to examine the jugular vein which was
compressible and free of internal echoes. A skin marker was used to
demarcate the planned venotomy and port pocket incision sites. Local
anesthesia was provided to these sites and the subcutaneous tunnel
track with 1% lidocaine with [DATE] epinephrine.

A small incision was created at the jugular access site and blunt
dissection was performed of the subcutaneous tissues. Under
ultrasound guidance, the jugular vein was accessed with a 21 ga
micropuncture needle and an 0.018" wire was inserted to the superior
vena cava. Real-time ultrasound guidance was utilized for vascular
access including the acquisition of a permanent ultrasound image
documenting patency of the accessed vessel. A 5 Fr micopuncture set
was then used, through which a 0.035" Rosen wire was passed under
fluoroscopic guidance into the inferior vena cava. An 8 Fr dilator
was then placed over the wire.

A subcutaneous port pocket was then created along the upper chest
wall utilizing a combination of sharp and blunt dissection. The
pocket was irrigated with sterile saline, packed with gauze, and
observed for hemorrhage. A single lumen standard sized power
injectable port was chosen for placement. The 8 Fr catheter was
tunneled from the port pocket site to the venotomy incision. The
port was placed in the pocket. The external catheter was trimmed to
appropriate length. The dilator was exchanged for an 8 Fr peel-away
sheath under fluoroscopic guidance. The catheter was then placed
through the sheath and the sheath was removed. Final catheter
positioning was confirmed and documented with a fluoroscopic spot
radiograph. The port was accessed with PERSON needle, aspirated,
and flushed with heparinized saline.

The deep dermal layer of the port pocket incision was closed with
interrupted 3-0 Vicryl suture. The skin was opposed with a running
subcuticular 4-0 Monocryl suture. Dermabond was then placed over the
port pocket and neck incisions. The patient tolerated the procedure
well without immediate post procedural complication.
FINDINGS: After catheter placement, the tip lies within the superior
cavoatrial junction. The catheter aspirates and flushes normally and
is ready for immediate use.
IMPRESSION: Successful placement of a power injectable Port-A-Cath via the left
internal jugular vein. The catheter is ready for immediate use.

## 2021-06-17 MED ORDER — SODIUM CHLORIDE 0.9 % IV SOLN: INTRAVENOUS | Status: DC

## 2021-06-17 MED ORDER — LIDOCAINE-EPINEPHRINE 1 %-1:100000 IJ SOLN
INTRAMUSCULAR | Status: AC
  Filled 2021-06-17: qty 1

## 2021-06-17 MED ORDER — MIDAZOLAM HCL 2 MG/2ML IJ SOLN
INTRAMUSCULAR | Status: AC | PRN
  Administered 2021-06-17: 1 mg via INTRAVENOUS

## 2021-06-17 MED ORDER — MIDAZOLAM HCL 2 MG/2ML IJ SOLN
INTRAMUSCULAR | Status: AC
  Filled 2021-06-17: qty 4

## 2021-06-17 MED ORDER — FENTANYL CITRATE (PF) 100 MCG/2ML IJ SOLN
INTRAMUSCULAR | Status: AC | PRN
  Administered 2021-06-17: 50 ug via INTRAVENOUS

## 2021-06-17 MED ORDER — HEPARIN SOD (PORK) LOCK FLUSH 100 UNIT/ML IV SOLN
INTRAVENOUS | Status: AC
  Filled 2021-06-17: qty 5

## 2021-06-17 MED ORDER — LIDOCAINE-EPINEPHRINE 1 %-1:100000 IJ SOLN
INTRAMUSCULAR | Status: AC | PRN
  Administered 2021-06-17: 20 mL

## 2021-06-17 MED ORDER — FENTANYL CITRATE (PF) 100 MCG/2ML IJ SOLN
INTRAMUSCULAR | Status: AC
  Filled 2021-06-17: qty 2

## 2021-06-17 NOTE — Progress Notes (Signed)
Called pt to introduce myself as her Financial Resource Specialist, discuss copay assistance and the [employer] grant.  I left a msg requesting she return my call if she's interested in applying for the grants. 

## 2021-06-17 NOTE — Procedures (Signed)
Interventional Radiology Procedure Note  Procedure: Single Lumen Power Port Placement    Access:  Left internal jugular vein  Findings: Catheter tip positioned at cavoatrial junction. Port is ready for immediate use.   Complications: None  EBL: < 10 mL  Recommendations:  - Ok to shower in 24 hours - Do not submerge for 7 days - Routine line care    Mckenzye Cutright, MD   

## 2021-06-17 NOTE — Progress Notes (Signed)
Pt returned from IR procedure.  Denies pain; slightly nauseated. VSS   Cool cloth to head- will monitor

## 2021-06-17 NOTE — Discharge Instructions (Addendum)
For questions /concerns may call Interventional Radiology at [phone] or [phone]  You may remove your dressing and shower tomorrow afternoon   DO NOT use EMLA cream for 2 weeks after port placement as the cream will remove surgical glue on your incision.      Implanted Port Insertion, Care After This sheet gives you information about how to care for yourself after your procedure. Your health care provider may also give you more specific instructions. If you have problems or questions, contact your health careprovider. What can I expect after the procedure? After the procedure, it is common to have: Discomfort at the port insertion site. Bruising on the skin over the port. This should improve over 3-4 days. Follow these instructions at home: [facility] care After your port is placed, you will get a manufacturer's information card. The card has information about your port. Keep this card with you at all times. Take care of the port as told by your health care provider. Ask your health care provider if you or a family member can get training for taking care of the port at home. A home health care nurse may also take care of the port. Make sure to remember what type of port you have. Incision care Follow instructions from your health care provider about how to take care of your port insertion site. Make sure you: Wash your hands with soap and water before and after you change your bandage (dressing). If soap and water are not available, use hand sanitizer. Change your dressing as told by your health care provider. Leave skin glue, or adhesive strips in place. These skin closures may need to stay in place for 2 weeks or longer.  Check your port insertion site every day for signs of infection. Check for:      - Redness, swelling, or pain.                     - Fluid or blood.      - Warmth.      - Pus or a bad smell. Activity Return to your normal activities as told by your health care  provider. Ask your health care provider what activities are safe for you. Do not lift anything that is heavier than 10 lb (4.5 kg), or the limit that you are told, until your health care provider says that it is safe. General instructions Take over-the-counter and prescription medicines only as told by your health care provider. Do not take baths, swim, or use a hot tub until your health care provider approves. Ask your health care provider if you may take showers. You may only be allowed to take sponge baths. Do not drive for 24 hours if you were given a sedative during your procedure. Wear a medical alert bracelet in case of an emergency. This will tell any health care providers that you have a port. Keep all follow-up visits as told by your health care provider. This is important. Contact a health care provider if: You cannot flush your port with saline as directed, or you cannot draw blood from the port. You have a fever or chills. You have redness, swelling, or pain around your port insertion site. You have fluid or blood coming from your port insertion site. Your port insertion site feels warm to the touch. You have pus or a bad smell coming from the port insertion site. Get help right away if: You have chest pain or shortness of breath. You  have bleeding from your port that you cannot control. Summary Take care of the port as told by your health care provider. Keep the manufacturer's information card with you at all times. Change your dressing as told by your health care provider. Contact a health care provider if you have a fever or chills or if you have redness, swelling, or pain around your port insertion site. Keep all follow-up visits as told by your health care provider. This information is not intended to replace advice given to you by your health care provider. Make sure you discuss any questions you have with your healthcare provider.   Moderate Conscious Sedation, Adult,  Care After This sheet gives you information about how to care for yourself after your procedure. Your health care provider may also give you more specific instructions. If you have problems or questions, contact your health careprovider. What can I expect after the procedure? After the procedure, it is common to have: Sleepiness for several hours. Impaired judgment for several hours. Difficulty with balance. Vomiting if you eat too soon. Follow these instructions at home: For the time period you were told by your health care provider: Rest. Do not participate in activities where you could fall or become injured. Do not drive or use machinery. Do not drink alcohol. Do not take sleeping pills or medicines that cause drowsiness. Do not make important decisions or sign legal documents. Do not take care of children on your own. Eating and drinking  Follow the diet recommended by your health care provider. Drink enough fluid to keep your urine pale yellow. If you vomit: Drink water, juice, or soup when you can drink without vomiting. Make sure you have little or no nausea before eating solid foods.  General instructions Take over-the-counter and prescription medicines only as told by your health care provider. Have a responsible adult stay with you for the time you are told. It is important to have someone help care for you until you are awake and alert. Do not smoke. Keep all follow-up visits as told by your health care provider. This is important. Contact a health care provider if: You are still sleepy or having trouble with balance after 24 hours. You feel light-headed. You keep feeling nauseous or you keep vomiting. You develop a rash. You have a fever. You have redness or swelling around the IV site. Get help right away if: You have trouble breathing. You have new-onset confusion at home. Summary After the procedure, it is common to feel sleepy, have impaired judgment, or feel  nauseous if you eat too soon. Rest after you get home. Know the things you should not do after the procedure. Follow the diet recommended by your health care provider and drink enough fluid to keep your urine pale yellow. Get help right away if you have trouble breathing or new-onset confusion at home. This information is not intended to replace advice given to you by your health care provider. Make sure you discuss any questions you have with your healthcare provider. Document Revised: 04/20/2019 Document Reviewed: 11/16/2018 [employer] Patient Education  2022 [employer].

## 2021-06-17 NOTE — Consult Note (Signed)
Chief Complaint: Patient was seen in consultation today for port-a-catheter placement  Referring Physician(s): Gudena,Vinay  Supervising Physician: Ruthann Cancer  Patient Status: [facility] - Out-pt  History of Present Illness: Patricia Houston is a 56 y.o. female with PMH of anemia, anxiety, and depression who was diagnosed with grade III invasive ductal carcinoma of the right breast on 05/19/21. CT on 06/09/21 revealed suspicion for nodal involvement with irregular mildly enlarged right axillary lymph nodes. She was referred to IR for port-a-catheter placement by Dr Nicholas Lose, MD.   Past Medical History:  Diagnosis Date   Anemia    TAKES IRON PILLS   Anxiety    Cancer ([place])    right breast DCIS   Depression     Past Surgical History:  Procedure Laterality Date   ABDOMINAL HYSTERECTOMY     BREAST LUMPECTOMY WITH RADIOACTIVE SEED LOCALIZATION Right 06/14/2018   Procedure: RIGHT BREAST RADIOACTIVE SEED LOCALIZATION LUMPECTOMY X2;  Surgeon: Erroll Luna, MD;  Location: [place];  Service: General;  Laterality: Right;   LAPAROSCOPIC ASSISTED VAGINAL HYSTERECTOMY N/A 02/27/2014   Procedure: LAPAROSCOPIC ASSISTED VAGINAL HYSTERECTOMY;  Surgeon: Maeola Sarah. Landry Mellow, MD;  Location: [facility];  Service: Gynecology;  Laterality: N/A;  abdomen to vagina to abdomen   TONSILLECTOMY      Allergies: Patient has no known allergies.  Medications: Prior to Admission medications   Medication Sig Start Date End Date Taking? Authorizing Provider  Cetirizine HCl (ZYRTEC PO) Take by mouth.   Yes [provider]  cholecalciferol (VITAMIN D3) 25 MCG (1000 UNIT) tablet Take 1 tablet (1,000 Units total) by mouth daily. 03/24/20   Nicholas Lose, MD  Cyanocobalamin (VITAMIN B-12) 3000 MCG SUBL Place 1 tablet under the tongue daily. 03/24/20   Nicholas Lose, MD  dexamethasone (DECADRON) 4 MG tablet Take 1 tablet (4 mg total) by mouth daily. Take 1 tablet day before chemo and 1 tablet  day after chemo with food 05/27/21   Nicholas Lose, MD  Fe Cbn-Fe Gluc-FA-B12-C-DSS (FERRALET 90) 90-1 MG TABS Take 1 tablet by mouth 2 (two) times daily.    [provider]  ferrous sulfate 325 (65 FE) MG tablet 1 tablet    [provider]  ibuprofen (ADVIL) 800 MG tablet Take 1 tablet (800 mg total) by mouth every 8 (eight) hours as needed. 06/14/18   Cornett, Marcello Moores, MD  lidocaine-prilocaine (EMLA) cream Apply to affected area once 05/27/21   Nicholas Lose, MD  loratadine (CLARITIN) 10 MG tablet Take 10 mg by mouth daily as needed for allergies.    [provider]  loratadine (CLARITIN) 10 MG tablet 1 tablet    [provider]  ondansetron (ZOFRAN) 8 MG tablet Take 1 tablet (8 mg total) by mouth 2 (two) times daily as needed (Nausea or vomiting). Start on the third day after chemotherapy. 05/27/21   Nicholas Lose, MD  prochlorperazine (COMPAZINE) 10 MG tablet Take 1 tablet (10 mg total) by mouth every 6 (six) hours as needed (Nausea or vomiting). 05/27/21   Nicholas Lose, MD  vitamin B-12 (CYANOCOBALAMIN) 100 MCG tablet See admin instructions.    [provider]     Family History  Problem Relation Age of Onset   Breast cancer Mother        dx. late 68s; s/p mastectomy   Pancreatic cancer Mother 5       adenocarcinoma   Other Sister        one sister had a hysterectomy due to heavy  bleeding at the age of 80   Heart attack Maternal Uncle 41   Heart attack Maternal Grandmother 83   Prostate cancer Maternal Grandfather 55       "very aggressive"   Leukemia Other 61       maternal great aunt (MGM's sister)   Stomach cancer Other        maternal great aunt (MGF's sister) dx. over age 76   Heart Problems Paternal Grandmother    Cancer Other        maternal great uncle (MGM's brother) dx. NOS cancer at later age   Breast cancer Cousin        (2) maternal "2nd cousins" dx. with breast cancers in their mid-50s   Colon cancer Neg Hx     Social  History   Socioeconomic History   Marital status: Married    Spouse name: Not on file   Number of children: Not on file   Years of education: Not on file   Highest education level: Not on file  Occupational History   Not on file  Tobacco Use   Smoking status: Never   Smokeless tobacco: Never  Vaping Use   Vaping Use: Never used  Substance and Sexual Activity   Alcohol use: Yes    Comment: rare - 1-2 drinks every few months   Drug use: No   Sexual activity: Yes    Birth control/protection: Surgical  Other Topics Concern   Not on file  Social History Narrative   Not on file   Social Determinants of Health   Financial Resource Strain: Not on file  Food Insecurity: Not on file  Transportation Needs: No Transportation Needs (06/20/2018)   PRAPARE - Hydrologist (Medical): No    Lack of Transportation (Non-Medical): No  Physical Activity: Not on file  Stress: Not on file  Social Connections: Not on file      Review of Systems: A 12 point ROS discussed and pertinent positives are indicated in the HPI above.  All other systems are negative.  Review of Systems  Constitutional:  Negative for chills and fever.  Respiratory:  Negative for shortness of breath.   Cardiovascular:  Negative for chest pain and leg swelling.  Gastrointestinal:  Negative for abdominal pain, diarrhea, nausea and vomiting.  Neurological:  Negative for dizziness and headaches.  Psychiatric/Behavioral:  Negative for confusion.     Vital Signs: BP 128/90 (BP Location: Left Arm)   Pulse 80   Temp 98.3 F (36.8 C) (Oral)   Resp 18   Ht '5\' 6"'$  (1.676 m)   Wt 181 lb (82.1 kg)   SpO2 95%   BMI 29.21 kg/m   Physical Exam Vitals reviewed.  Constitutional:      Appearance: She is not ill-appearing.  Cardiovascular:     Rate and Rhythm: Normal rate and regular rhythm.     Pulses: Normal pulses.     Heart sounds: Normal heart sounds.  Pulmonary:     Effort: Pulmonary  effort is normal.     Breath sounds: Normal breath sounds.  Abdominal:     General: Bowel sounds are normal.     Palpations: Abdomen is soft.  Skin:    General: Skin is warm and dry.  Neurological:     Mental Status: She is alert and oriented to person, place, and time.  Psychiatric:        Behavior: Behavior normal.     Imaging: NM Bone Scan  Whole Body  Result Date: 06/10/2021 CLINICAL DATA:  Breast cancer, staging. EXAM: NUCLEAR MEDICINE WHOLE BODY BONE SCAN TECHNIQUE: Whole body anterior and posterior images were obtained approximately 3 hours after intravenous injection of radiopharmaceutical. RADIOPHARMACEUTICALS:  19.2 mCi Technetium-27mMDP IV COMPARISON:  None Available. FINDINGS: No scintigraphic evidence of abnormal radiotracer uptake to suggest osteoblastic metastatic disease. Multifocal radiotracer uptake in a pattern most consistent with degenerative arthropathy. Otherwise physiologic distribution of radiotracer activity. IMPRESSION: No scintigraphic evidence of osteoblastic metastatic disease. Electronically Signed   By: JDahlia BailiffM.D.   On: 06/10/2021 07:12   CT CHEST ABDOMEN PELVIS W CONTRAST  Result Date: 06/10/2021 CLINICAL DATA:  Right breast cancer, staging.  * Tracking Code: BO * EXAM: CT CHEST, ABDOMEN, AND PELVIS WITH CONTRAST TECHNIQUE: Multidetector CT imaging of the chest, abdomen and pelvis was performed following the standard protocol during bolus administration of intravenous contrast. RADIATION DOSE REDUCTION: This exam was performed according to the departmental dose-optimization program which includes automated exposure control, adjustment of the mA and/or kV according to patient size and/or use of iterative reconstruction technique. CONTRAST:  1098mOMNIPAQUE IOHEXOL 300 MG/ML  SOLN COMPARISON:  None Available. FINDINGS: CT CHEST FINDINGS Cardiovascular: Normal caliber thoracic aorta. No central pulmonary embolus on this nondedicated study. Normal size heart.  No significant pericardial effusion/thickening. Mediastinum/Nodes: No supraclavicular adenopathy. No suspicious thyroid nodule. No pathologically enlarged mediastinal or hilar lymph nodes. Esophagus is grossly unremarkable. Right axillary surgical clips. Enlarged right axillary lymph nodes measure up to 11 mm in short axis on image 18/2. Prominent left axillary lymph nodes measure up to 6 mm in short axis but appear to maintain their normal reniform architecture. Lungs/Pleura: Solid 4 mm right lower lobe pulmonary nodule on image 71/4. No focal airspace consolidation. No pleural effusion. No pneumothorax. Musculoskeletal: No aggressive lytic or blastic lesion of bone. Surgical clips in the right breast adjacent to a masslike area measuring 2.3 cm on image 26/2. CT ABDOMEN PELVIS FINDINGS Hepatobiliary: Tiny bilobar hypodensities for instance in the left lobe of the liver on image 51/2 measuring 4 mm in the peripheral right lobe of the liver on image 53/2 measuring 4 mm are technically too small to accurately characterize. Gallbladder is unremarkable. No biliary ductal dilation. Pancreas: No pancreatic ductal dilation or evidence of acute inflammation. Spleen: No splenomegaly or focal splenic lesion. Adrenals/Urinary Tract: Bilateral adrenal glands appear normal. No hydronephrosis. No suspicious renal mass. Nodular area along the inferior bladder wall on sagittal image 117/6 and coronal image 86/5 may reflect focal wall thickening versus extrinsic effacement of the urinary bladder by the vagina. Stomach/Bowel: Radiopaque enteric contrast material traverses the hepatic flexure. Small hiatal hernia otherwise the stomach is unremarkable for degree of distension. No pathologic dilation of small or large bowel. The appendix and terminal ileum appear normal. Moderate volume of formed stool throughout the colon suggestive of constipation. Redundant sigmoid colon. No evidence of acute bowel inflammation. Vascular/Lymphatic:  Normal caliber abdominal aorta. No pathologically enlarged abdominal or pelvic lymph nodes. Reproductive: Status post hysterectomy. No adnexal masses. Other: No significant abdominopelvic free fluid. Musculoskeletal: No aggressive lytic or blastic lesion of bone. IMPRESSION: 1. Surgical clips in the right breast adjacent to a masslike 2.3 cm area, likely reflecting patient's known primary breast neoplasm. 2. Irregular mildly enlarged right axillary lymph nodes are suspicious for local nodal disease involvement. 3. Prominent left axillary lymph nodes are nonspecific, consider further evaluation with dedicated ultrasound and possible FNA. 4. Solid 4 mm right lower lobe pulmonary nodule  is favored to reflect sequela of infection/inflammation. However metastatic disease is not technically excluded consider attention on short-term interval follow-up imaging. 5. Tiny bilobar hepatic hypodensities measure up to 4 mm and are technically too small to accurately characterize. While likely to reflect a benign etiology such as cysts or hemangiomas, metastatic disease is not technically excluded consider attention on short-term interval follow-up imaging. 6. Nodular area along the inferior bladder wall may reflect focal wall thickening versus extrinsic effacement of the urinary bladder by the vagina, recommend correlation with history of hematuria and possible cystoscopy if clinically indicated. Electronically Signed   By: Dahlia Bailiff M.D.   On: 06/10/2021 07:10   MR BREAST BILATERAL W WO CONTRAST INC CAD  Result Date: 06/08/2021 CLINICAL DATA:  56 year old female presenting for or staging evaluation. History of right breast excision demonstrating intraductal papilloma in 2020. Family history of breast cancer in the patient's mother. EXAM: BILATERAL BREAST MRI WITH AND WITHOUT CONTRAST TECHNIQUE: Multiplanar, multisequence MR images of both breasts were obtained prior to and following the intravenous administration of 8 ml  of Gadavist Three-dimensional MR images were rendered by post-processing of the original MR data on an independent workstation. The three-dimensional MR images were interpreted, and findings are reported in the following complete MRI report for this study. Three dimensional images were evaluated at the independent interpreting workstation using the DynaCAD thin client. COMPARISON:  None Available. FINDINGS: Breast composition: c. Heterogeneous fibroglandular tissue. Background parenchymal enhancement: Moderate. Right breast: There is an irregular heterogeneously enhancing mass in the central posterior right breast consistent with biopsy proven malignancy. The mass measures 2.8 x 2.1 x 2.5 cm (series 6, image 74). There is additional subtle non mass enhancement and small masses surrounding the main mass in extending anteriorly towards the nipple. The overall area of abnormality measures approximately 6.5 x 4.9 cm. A representative satellite medial to the dominant mass measures 6 mm (series 6, image 66). Left breast: There is a small enhancing mass in the retroareolar left breast measuring 0.6 cm (series 6, image 78). There is an additional area of clumped non mass enhancement in the lower slightly outer left breast spanning approximately 3.5 x 2.3 cm (series 9, image 86). Lymph nodes: No abnormal appearing lymph nodes. There is susceptibility artifact in a right axillary lymph node consistent with a biopsy marking clip. Ancillary findings:  None. IMPRESSION: 1. Biopsy-proven malignancy in the central posterior right breast measuring 2.8 x 2.1 x 2.5 cm. There is additional subtle non mass enhancement and small masses surrounding the main mass extending anteriorly towards the nipple. The overall area of abnormality measures approximately 6.5 x 4.9 cm. 2. Indeterminate 0.6 cm enhancing mass in the retroareolar left breast. 3. Additional indeterminate area of clumped non mass enhancement in the lower slightly outer left  breast spanning approximately 3.5 x 2.3 cm. RECOMMENDATION: 1. If breast conservation is desired recommend additional MRI guided biopsies x2 for the right breast targeting the medial dominant satellite lesion and the non mass enhancement in the retroareolar aspect. 2. MRI guided biopsy x 2 of the left breast for the small retroareolar mass and clumped non mass enhancement. 3.  Schedule right and left breast on separate days. BI-RADS CATEGORY  4: Suspicious. Electronically Signed   By: Audie Pinto M.D.   On: 06/08/2021 14:08  ECHOCARDIOGRAM COMPLETE  Result Date: 06/04/2021    ECHOCARDIOGRAM REPORT   Patient Name:   Patricia Houston Date of Exam: 06/04/2021 Medical Rec #:  [id number]  Height:       66.0 in Accession #:    [id number]         Weight:       182.8 lb Date of Birth:  1965/12/23         BSA:          1.925 m Patient Age:    59 years           BP:           127/93 mmHg Patient Gender: F                  HR:           73 bpm. Exam Location:  Outpatient Procedure: 2D Echo, Cardiac Doppler, Color Doppler, 3D Echo and Strain Analysis Indications:    Chemo Z09  History:        Patient has no prior history of Echocardiogram examinations.  Sonographer:    Mikki Santee RDCS Referring Phys: [id number] Nicholas Lose IMPRESSIONS  1. Left ventricular ejection fraction, by estimation, is 60 to 65%. The left ventricle has normal function. The left ventricle has no regional wall motion abnormalities. Left ventricular diastolic parameters were normal.  2. Right ventricular systolic function is normal. The right ventricular size is normal. Tricuspid regurgitation signal is inadequate for assessing PA pressure.  3. The mitral valve is normal in structure. No evidence of mitral valve regurgitation.  4. The aortic valve is normal in structure. Aortic valve regurgitation is not visualized.  5. The inferior vena cava is normal in size with <50% respiratory variability, suggesting right atrial pressure of 8  mmHg. Comparison(s): No prior Echocardiogram. Conclusion(s)/Recommendation(s): Normal biventricular function without evidence of hemodynamically significant valvular heart disease. FINDINGS  Left Ventricle: Left ventricular ejection fraction, by estimation, is 60 to 65%. The left ventricle has normal function. The left ventricle has no regional wall motion abnormalities. The left ventricular internal cavity size was normal in size. There is  no left ventricular hypertrophy. Left ventricular diastolic parameters were normal. Right Ventricle: The right ventricular size is normal. No increase in right ventricular wall thickness. Right ventricular systolic function is normal. Tricuspid regurgitation signal is inadequate for assessing PA pressure. Left Atrium: Left atrial size was normal in size. Right Atrium: Right atrial size was normal in size. Pericardium: There is no evidence of pericardial effusion. Mitral Valve: The mitral valve is normal in structure. No evidence of mitral valve regurgitation. Tricuspid Valve: The tricuspid valve is normal in structure. Tricuspid valve regurgitation is not demonstrated. Aortic Valve: The aortic valve is normal in structure. Aortic valve regurgitation is not visualized. Pulmonic Valve: The pulmonic valve was normal in structure. Pulmonic valve regurgitation is not visualized. Aorta: The aortic root and ascending aorta are structurally normal, with no evidence of dilitation. Venous: The inferior vena cava is normal in size with less than 50% respiratory variability, suggesting right atrial pressure of 8 mmHg. IAS/Shunts: The atrial septum is grossly normal.  LEFT VENTRICLE PLAX 2D LVIDd:         4.50 cm   Diastology LVIDs:         2.60 cm   LV e' medial:    9.90 cm/s LV PW:         0.80 cm   LV E/e' medial:  7.9 LV IVS:        0.70 cm   LV e' lateral:   10.60 cm/s LVOT diam:     1.90 cm   LV E/e'  lateral: 7.4 LV SV:         51 LV SV Index:   27        2D Longitudinal Strain LVOT  Area:     2.84 cm  2D Strain GLS Avg:     -22.0 %                           3D Volume EF:                          3D EF:        61 %                          LV EDV:       120 ml                          LV ESV:       46 ml                          LV SV:        73 ml RIGHT VENTRICLE RV Basal diam:  2.90 cm RV S prime:     8.78 cm/s TAPSE (M-mode): 2.0 cm LEFT ATRIUM             Index        RIGHT ATRIUM           Index LA diam:        3.40 cm 1.77 cm/m   RA Area:     10.10 cm LA Vol (A2C):   37.8 ml 19.64 ml/m  RA Volume:   21.40 ml  11.12 ml/m LA Vol (A4C):   36.8 ml 19.12 ml/m LA Biplane Vol: 40.4 ml 20.99 ml/m  AORTIC VALVE LVOT Vmax:   101.00 cm/s LVOT Vmean:  60.300 cm/s LVOT VTI:    0.181 m  AORTA Ao Root diam: 2.60 cm Ao Asc diam:  2.80 cm MITRAL VALVE MV Area (PHT): 2.96 cm    SHUNTS MV Decel Time: 256 msec    Systemic VTI:  0.18 m MV E velocity: 78.60 cm/s  Systemic Diam: 1.90 cm MV A velocity: 54.50 cm/s MV E/A ratio:  1.44 Landscape architect signed by Phineas Inches Signature Date/Time: 06/04/2021/12:20:28 PM    Final     Labs:  CBC: Recent Labs    05/27/21 1244  WBC 5.4  HGB 14.6  HCT 44.5  PLT 213    COAGS: No results for input(s): "INR", "APTT" in the last 8760 hours.  BMP: Recent Labs    05/27/21 1244  NA 137  K 4.3  CL 102  CO2 30  GLUCOSE 93  BUN 15  CALCIUM 10.4*  CREATININE 0.96  GFRNONAA >60    LIVER FUNCTION TESTS: Recent Labs    05/27/21 1244  BILITOT 0.5  AST 13*  ALT 12  ALKPHOS 106  PROT 8.4*  ALBUMIN 4.6    TUMOR MARKERS: No results for input(s): "AFPTM", "CEA", "CA199", "CHROMGRNA" in the last 8760 hours.  Assessment and Plan: Patricia Houston is a 56 year old female recently diagnosed with grade III invasive ductal carcinoma of the right breast. She will undergo a port-a-catheter placement by Dr. Serafina Royals on 06/17/21.  Risks and benefits of image guided port-a-catheter placement was discussed with  the patient including, but not  limited to bleeding, infection, pneumothorax, or fibrin sheath development and need for additional procedures.  All of the patient's questions were answered, patient is agreeable to proceed. Consent signed and in chart.     Thank you for this interesting consult.  I greatly enjoyed meeting Patricia Houston and look forward to participating in their care.  A copy of this report was sent to the requesting provider on this date.  Electronically Signed: Lura Em, PA 06/17/2021, 8:26 AM   I spent a total of  25 minutes   in face to face in clinical consultation, greater than 50% of which was counseling/coordinating care for port-a-catheter placement.

## 2021-06-17 NOTE — Progress Notes (Signed)
[id number] - TREATMENT OF REFRACTORY NAUSEA  This Nurse has reviewed this patient's inclusion and exclusion criteria as a second review and confirms Patricia Houston is eligible for study participation.  Patient may continue with enrollment.  Foye Spurling, BSN, RN, [place] Nurse II 06/17/2021

## 2021-06-18 ENCOUNTER — Other Ambulatory Visit: Payer: Self-pay

## 2021-06-18 ENCOUNTER — Inpatient Hospital Stay (HOSPITAL_BASED_OUTPATIENT_CLINIC_OR_DEPARTMENT_OTHER): Payer: Federal, State, Local not specified - PPO | Admitting: Hematology and Oncology

## 2021-06-18 ENCOUNTER — Other Ambulatory Visit: Payer: Federal, State, Local not specified - PPO

## 2021-06-18 ENCOUNTER — Inpatient Hospital Stay: Payer: Federal, State, Local not specified - PPO

## 2021-06-18 ENCOUNTER — Encounter: Payer: Self-pay | Admitting: Radiology

## 2021-06-18 VITALS — BP 112/68 | HR 78 | Temp 98.2°F | Resp 18

## 2021-06-18 DIAGNOSIS — R197 Diarrhea, unspecified: Secondary | ICD-10-CM | POA: Diagnosis not present

## 2021-06-18 DIAGNOSIS — Z79899 Other long term (current) drug therapy: Secondary | ICD-10-CM | POA: Diagnosis not present

## 2021-06-18 DIAGNOSIS — Z7952 Long term (current) use of systemic steroids: Secondary | ICD-10-CM | POA: Diagnosis not present

## 2021-06-18 DIAGNOSIS — Z006 Encounter for examination for normal comparison and control in clinical research program: Secondary | ICD-10-CM

## 2021-06-18 DIAGNOSIS — C50411 Malignant neoplasm of upper-outer quadrant of right female breast: Secondary | ICD-10-CM | POA: Diagnosis not present

## 2021-06-18 DIAGNOSIS — Z95828 Presence of other vascular implants and grafts: Secondary | ICD-10-CM

## 2021-06-18 DIAGNOSIS — Z171 Estrogen receptor negative status [ER-]: Secondary | ICD-10-CM | POA: Diagnosis not present

## 2021-06-18 DIAGNOSIS — R102 Pelvic and perineal pain: Secondary | ICD-10-CM | POA: Diagnosis not present

## 2021-06-18 DIAGNOSIS — R5383 Other fatigue: Secondary | ICD-10-CM | POA: Diagnosis not present

## 2021-06-18 DIAGNOSIS — M898X9 Other specified disorders of bone, unspecified site: Secondary | ICD-10-CM | POA: Diagnosis not present

## 2021-06-18 HISTORY — DX: Presence of other vascular implants and grafts: Z95.828

## 2021-06-18 LAB — CBC WITH DIFFERENTIAL/PLATELET
Abs Immature Granulocytes: 0.01 10*3/uL (ref 0.00–0.07)
Basophils Absolute: 0 10*3/uL (ref 0.0–0.1)
Basophils Relative: 1 %
Eosinophils Absolute: 0.1 10*3/uL (ref 0.0–0.5)
Eosinophils Relative: 2 %
HCT: 37.8 % (ref 36.0–46.0)
Hemoglobin: 12.6 g/dL (ref 12.0–15.0)
Immature Granulocytes: 0 %
Lymphocytes Relative: 32 %
Lymphs Abs: 1.7 10*3/uL (ref 0.7–4.0)
MCH: 28.8 pg (ref 26.0–34.0)
MCHC: 33.3 g/dL (ref 30.0–36.0)
MCV: 86.3 fL (ref 80.0–100.0)
Monocytes Absolute: 0.3 10*3/uL (ref 0.1–1.0)
Monocytes Relative: 6 %
Neutro Abs: 3.2 10*3/uL (ref 1.7–7.7)
Neutrophils Relative %: 59 %
Platelets: 191 10*3/uL (ref 150–400)
RBC: 4.38 MIL/uL (ref 3.87–5.11)
RDW: 13.2 % (ref 11.5–15.5)
WBC: 5.4 10*3/uL (ref 4.0–10.5)
nRBC: 0 % (ref 0.0–0.2)

## 2021-06-18 LAB — COMPREHENSIVE METABOLIC PANEL
ALT: 19 U/L (ref 0–44)
AST: 17 U/L (ref 15–41)
Albumin: 4.2 g/dL (ref 3.5–5.0)
Alkaline Phosphatase: 100 U/L (ref 38–126)
Anion gap: 6 (ref 5–15)
BUN: 14 mg/dL (ref 6–20)
CO2: 28 mmol/L (ref 22–32)
Calcium: 10.1 mg/dL (ref 8.9–10.3)
Chloride: 104 mmol/L (ref 98–111)
Creatinine, Ser: 0.96 mg/dL (ref 0.44–1.00)
GFR, Estimated: >60 mL/min (ref >60)
Glucose, Bld: 144 mg/dL — ABNORMAL HIGH (ref 70–99)
Potassium: 3.7 mmol/L (ref 3.5–5.1)
Sodium: 138 mmol/L (ref 135–145)
Total Bilirubin: 0.4 mg/dL (ref 0.3–1.2)
Total Protein: 7.4 g/dL (ref 6.5–8.1)

## 2021-06-18 LAB — RESEARCH LABS

## 2021-06-18 MED ORDER — SODIUM CHLORIDE 0.9 % IV SOLN
420.0000 mg | Freq: Once | INTRAVENOUS | Status: AC
  Administered 2021-06-18: 420 mg via INTRAVENOUS
  Filled 2021-06-18: qty 14

## 2021-06-18 MED ORDER — SODIUM CHLORIDE 0.9 % IV SOLN
10.0000 mg | Freq: Once | INTRAVENOUS | Status: AC
  Administered 2021-06-18: 10 mg via INTRAVENOUS
  Filled 2021-06-18: qty 10

## 2021-06-18 MED ORDER — SODIUM CHLORIDE 0.9 % IV SOLN: Freq: Once | INTRAVENOUS | Status: AC

## 2021-06-18 MED ORDER — ACETAMINOPHEN 325 MG PO TABS
650.0000 mg | ORAL_TABLET | Freq: Once | ORAL | Status: AC
  Administered 2021-06-18: 650 mg via ORAL
  Filled 2021-06-18: qty 2

## 2021-06-18 MED ORDER — SODIUM CHLORIDE 0.9 % IV SOLN
600.0000 mg | Freq: Once | INTRAVENOUS | Status: AC
  Administered 2021-06-18: 600 mg via INTRAVENOUS
  Filled 2021-06-18: qty 60

## 2021-06-18 MED ORDER — SODIUM CHLORIDE 0.9 % IV SOLN
150.0000 mg | Freq: Once | INTRAVENOUS | Status: AC
  Administered 2021-06-18: 150 mg via INTRAVENOUS
  Filled 2021-06-18: qty 150

## 2021-06-18 MED ORDER — SODIUM CHLORIDE 0.9 % IV SOLN
75.0000 mg/m2 | Freq: Once | INTRAVENOUS | Status: AC
  Administered 2021-06-18: 150 mg via INTRAVENOUS
  Filled 2021-06-18: qty 15

## 2021-06-18 MED ORDER — DIPHENHYDRAMINE HCL 25 MG PO CAPS
25.0000 mg | ORAL_CAPSULE | Freq: Once | ORAL | Status: AC
  Administered 2021-06-18: 25 mg via ORAL
  Filled 2021-06-18: qty 1

## 2021-06-18 MED ORDER — TRASTUZUMAB-DKST CHEMO 150 MG IV SOLR
8.0000 mg/kg | Freq: Once | INTRAVENOUS | Status: AC
  Administered 2021-06-18: 672 mg via INTRAVENOUS
  Filled 2021-06-18: qty 32

## 2021-06-18 MED ORDER — SODIUM CHLORIDE 0.9% FLUSH
10.0000 mL | Freq: Once | INTRAVENOUS | Status: AC
  Administered 2021-06-18: 10 mL

## 2021-06-18 MED ORDER — PALONOSETRON HCL INJECTION 0.25 MG/5ML
0.2500 mg | Freq: Once | INTRAVENOUS | Status: AC
  Administered 2021-06-18: 0.25 mg via INTRAVENOUS
  Filled 2021-06-18: qty 5

## 2021-06-18 NOTE — Progress Notes (Signed)
Reduce perjeta to '420mg'$  per Dr Lindi Adie

## 2021-06-18 NOTE — Research (Signed)
[id number] - TREATMENT OF REFRACTORY NAUSEA   06/18/2021  BASELINE: Patient arrives today Accompanied by her sister and daughter  for the cycle 1 day 1 of chemo.   PROs: Per study protocol, all PROs required for this visit were completed prior to other study activities and completeness has been verified. PROs were completed via paper.    LABS: Mandatory and optional labs are collected per consent and study protocol: Patient Patricia Houston tolerated well without complaint.   MEDICATION REVIEW: Patient is taking Zyrtec. Okay per study correspondence.  Reported changes were recorded on the medication list and marked as reviewed. All other listed medications remain the same.  MD/PROVIDER VISIT: Patient sees Dr. Lindi Adie for today's visit.  The patient was thanked for their time and continued voluntary participation in this study. Patient Patricia Houston has been provided direct contact information and is encouraged to contact this Coordinator for any needs or questions. Patient was given Cycle 1 questionnaires as well as the 4 day home record. Education was provided on how to complete this questionnaire. Will follow up with patient on Cycle 1 Day 5 due to day 4 being a non-business day.   Carol Ada, RT(R)(T) Clinical Research Coordinator

## 2021-06-18 NOTE — Assessment & Plan Note (Addendum)
06/14/2018: Right lumpectomy: Intraductal papilloma with usual ductal hyperplasia, 1 benign lymph node (decided against tamoxifen) 05/19/2021: Screening mammogram detected right breast density.  2 lymph nodes were noted biopsy appointment was benign.  Ultrasound revealed 2.2 cm mass at 11 o'clock position right breast biopsy: Grade 3 IDC with necrosis, ER 0%, PR 0%, Ki-67 60%, HER2 3+ positive by IHC  Treatment Plan: 1. Neoadjuvant chemotherapy with TCH Perjeta 6 cycles followed by Herceptin Perjeta maintenance versus Kadcyla maintenance (based on response to neoadjuvant chemo) for 1 year 2. Followed by breast conserving surgery  with sentinel lymph node study 3. Followed by adjuvant radiation therapy  MRI Breast 06/08/21:  Rt Breast cancer 2.8 cm, additional NME total 6.5 cm, Left breast 0.6 cm retroareolar mass, additional indeterminate NME 3.5 cm (Need MRI guided biopsies Rt Br and Left Breast) CT CAP 06/10/21: Breast masses and Bil Axillary LN, 4 mm Rt LL nodule, tiny hepatic densities 4 mm too small, nodular area inferior bladder (wall thickening vs ext effacement) Bone scan 06/10/21: Benign --------------------------------------------------------------------------------------------------------------- Current Treatment: Cycle 1 TCHP ECHO EF 60-65% Chemo consent obtained. Chemo education completed  RTC in 1 week for Tox check

## 2021-06-18 NOTE — Patient Instructions (Signed)
[facility]  Discharge Instructions: Thank you for choosing [facility] to provide your oncology and hematology care.   If you have a lab appointment with the [facility], please go directly to the [facility] and check in at the registration area.   Wear comfortable clothing and clothing appropriate for easy access to any Portacath or PICC line.   We strive to give you quality time with your provider. You may need to reschedule your appointment if you arrive late (15 or more minutes).  Arriving late affects you and other patients whose appointments are after yours.  Also, if you miss three or more appointments without notifying the office, you may be dismissed from the clinic at the provider's discretion.      For prescription refill requests, have your pharmacy contact our office and allow 72 hours for refills to be completed.    Today you received the following chemotherapy and/or immunotherapy agents taxotere, carboplatin, herceptin, perjeta   To help prevent nausea and vomiting after your treatment, we encourage you to take your nausea medication as directed.  BELOW ARE SYMPTOMS THAT SHOULD BE REPORTED IMMEDIATELY: *FEVER GREATER THAN 100.4 F (38 C) OR HIGHER *CHILLS OR SWEATING *NAUSEA AND VOMITING THAT IS NOT CONTROLLED WITH YOUR NAUSEA MEDICATION *UNUSUAL SHORTNESS OF BREATH *UNUSUAL BRUISING OR BLEEDING *URINARY PROBLEMS (pain or burning when urinating, or frequent urination) *BOWEL PROBLEMS (unusual diarrhea, constipation, pain near the anus) TENDERNESS IN MOUTH AND THROAT WITH OR WITHOUT PRESENCE OF ULCERS (sore throat, sores in mouth, or a toothache) UNUSUAL RASH, SWELLING OR PAIN  UNUSUAL VAGINAL DISCHARGE OR ITCHING   Items with * indicate a potential emergency and should be followed up as soon as possible or go to the Emergency Department if any problems should occur.  Please show the CHEMOTHERAPY ALERT CARD or  IMMUNOTHERAPY ALERT CARD at check-in to the Emergency Department and triage nurse.  Should you have questions after your visit or need to cancel or reschedule your appointment, please contact [facility]  Dept: [phone]  and follow the prompts.  Office hours are 8:00 a.m. to 4:30 p.m. Monday - Friday. Please note that voicemails left after 4:00 p.m. may not be returned until the following business day.  We are closed weekends and major holidays. You have access to a nurse at all times for urgent questions. Please call the main number to the clinic Dept: [phone] and follow the prompts.   For any non-urgent questions, you may also contact your provider using MyChart. We now offer e-Visits for anyone 24 and older to request care online for non-urgent symptoms. For details visit [email or web address].   Also download the MyChart app! Go to the app store, search "MyChart", open the app, select [facility], and log in with your MyChart username and password.  Masks are optional in the cancer centers. If you would like for your care team to wear a mask while they are taking care of you, please let them know. For doctor visits, patients may have with them one support person who is at least 54 years old. At this time, visitors are not allowed in the infusion area.   Trastuzumab injection for infusion What is this medication? TRASTUZUMAB (tras TOO zoo mab) is a monoclonal antibody. It is used to treat breast cancer and stomach cancer. This medicine may be used for other purposes; ask your health care provider or pharmacist if you have questions. COMMON BRAND NAME(S):  Herceptin, Herzuma, KANJINTI, Ogivri, Ontruzant, Trazimera What should I tell my care team before I take this medication? They need to know if you have any of these conditions: heart disease heart failure lung or breathing disease, like asthma an unusual or allergic reaction to trastuzumab, benzyl  alcohol, or other medications, foods, dyes, or preservatives pregnant or trying to get pregnant breast-feeding How should I use this medication? This drug is given as an infusion into a vein. It is administered in a hospital or clinic by a specially trained health care professional. Talk to your pediatrician regarding the use of this medicine in children. This medicine is not approved for use in children. Overdosage: If you think you have taken too much of this medicine contact a poison control center or emergency room at once. NOTE: This medicine is only for you. Do not share this medicine with others. What if I miss a dose? It is important not to miss a dose. Call your doctor or health care professional if you are unable to keep an appointment. What may interact with this medication? This medicine may interact with the following medications: certain types of chemotherapy, such as daunorubicin, doxorubicin, epirubicin, and idarubicin This list may not describe all possible interactions. Give your health care provider a list of all the medicines, herbs, non-prescription drugs, or dietary supplements you use. Also tell them if you smoke, drink alcohol, or use illegal drugs. Some items may interact with your medicine. What should I watch for while using this medication? Visit your doctor for checks on your progress. Report any side effects. Continue your course of treatment even though you feel ill unless your doctor tells you to stop. Call your doctor or health care professional for advice if you get a fever, chills or sore throat, or other symptoms of a cold or flu. Do not treat yourself. Try to avoid being around people who are sick. You may experience fever, chills and shaking during your first infusion. These effects are usually mild and can be treated with other medicines. Report any side effects during the infusion to your health care professional. Fever and chills usually do not happen with  later infusions. Do not become pregnant while taking this medicine or for 7 months after stopping it. Women should inform their doctor if they wish to become pregnant or think they might be pregnant. Women of child-bearing potential will need to have a negative pregnancy test before starting this medicine. There is a potential for serious side effects to an unborn child. Talk to your health care professional or pharmacist for more information. Do not breast-feed an infant while taking this medicine or for 7 months after stopping it. Women must use effective birth control with this medicine. What side effects may I notice from receiving this medication? Side effects that you should report to your doctor or health care professional as soon as possible: allergic reactions like skin rash, itching or hives, swelling of the face, lips, or tongue chest pain or palpitations cough dizziness feeling faint or lightheaded, falls fever general ill feeling or flu-like symptoms signs of worsening heart failure like breathing problems; swelling in your legs and feet unusually weak or tired Side effects that usually do not require medical attention (report to your doctor or health care professional if they continue or are bothersome): bone pain changes in taste diarrhea joint pain nausea/vomiting weight loss This list may not describe all possible side effects. Call your doctor for medical advice about side effects.   You may report side effects to FDA at 1-800-FDA-1088. Where should I keep my medication? This drug is given in a hospital or clinic and will not be stored at home. NOTE: This sheet is a summary. It may not cover all possible information. If you have questions about this medicine, talk to your doctor, pharmacist, or health care provider.  2023 Elsevier/Gold Standard (2016-01-06 00:00:00) Pertuzumab injection What is this medication? PERTUZUMAB (per TOOZ ue mab) is a monoclonal antibody. It is  used to treat breast cancer. This medicine may be used for other purposes; ask your health care provider or pharmacist if you have questions. COMMON BRAND NAME(S): PERJETA What should I tell my care team before I take this medication? They need to know if you have any of these conditions: heart disease heart failure high blood pressure history of irregular heart beat recent or ongoing radiation therapy an unusual or allergic reaction to pertuzumab, other medicines, foods, dyes, or preservatives pregnant or trying to get pregnant breast-feeding How should I use this medication? This medicine is for infusion into a vein. It is given by a health care professional in a hospital or clinic setting. Talk to your pediatrician regarding the use of this medicine in children. Special care may be needed. Overdosage: If you think you have taken too much of this medicine contact a poison control center or emergency room at once. NOTE: This medicine is only for you. Do not share this medicine with others. What if I miss a dose? It is important not to miss your dose. Call your doctor or health care professional if you are unable to keep an appointment. What may interact with this medication? Interactions are not expected. Give your health care provider a list of all the medicines, herbs, non-prescription drugs, or dietary supplements you use. Also tell them if you smoke, drink alcohol, or use illegal drugs. Some items may interact with your medicine. This list may not describe all possible interactions. Give your health care provider a list of all the medicines, herbs, non-prescription drugs, or dietary supplements you use. Also tell them if you smoke, drink alcohol, or use illegal drugs. Some items may interact with your medicine. What should I watch for while using this medication? Your condition will be monitored carefully while you are receiving this medicine. Report any side effects. Continue your  course of treatment even though you feel ill unless your doctor tells you to stop. Do not become pregnant while taking this medicine or for 7 months after stopping it. Women should inform their doctor if they wish to become pregnant or think they might be pregnant. Women of child-bearing potential will need to have a negative pregnancy test before starting this medicine. There is a potential for serious side effects to an unborn child. Talk to your health care professional or pharmacist for more information. Do not breast-feed an infant while taking this medicine or for 7 months after stopping it. Women must use effective birth control with this medicine. Call your doctor or health care professional for advice if you get a fever, chills or sore throat, or other symptoms of a cold or flu. Do not treat yourself. Try to avoid being around people who are sick. You may experience fever, chills, and headache during the infusion. Report any side effects during the infusion to your health care professional. What side effects may I notice from receiving this medication? Side effects that you should report to your doctor or health care professional as  soon as possible: breathing problems chest pain or palpitations dizziness feeling faint or lightheaded fever or chills skin rash, itching or hives sore throat swelling of the face, lips, or tongue swelling of the legs or ankles unusually weak or tired Side effects that usually do not require medical attention (report to your doctor or health care professional if they continue or are bothersome): diarrhea hair loss nausea, vomiting tiredness This list may not describe all possible side effects. Call your doctor for medical advice about side effects. You may report side effects to FDA at 1-800-FDA-1088. Where should I keep my medication? This drug is given in a hospital or clinic and will not be stored at home. NOTE: This sheet is a summary. It may not  cover all possible information. If you have questions about this medicine, talk to your doctor, pharmacist, or health care provider.  2023 Elsevier/Gold Standard (2015-01-23 00:00:00) Docetaxel injection What is this medication? DOCETAXEL (doe se TAX el) is a chemotherapy drug. It targets fast dividing cells, like cancer cells, and causes these cells to die. This medicine is used to treat many types of cancers like breast cancer, certain stomach cancers, head and neck cancer, lung cancer, and prostate cancer. This medicine may be used for other purposes; ask your health care provider or pharmacist if you have questions. COMMON BRAND NAME(S): Docefrez, Taxotere What should I tell my care team before I take this medication? They need to know if you have any of these conditions: infection (especially a virus infection such as chickenpox, cold sores, or herpes) liver disease low blood counts, like low white cell, platelet, or red cell counts an unusual or allergic reaction to docetaxel, polysorbate 80, other chemotherapy agents, other medicines, foods, dyes, or preservatives pregnant or trying to get pregnant breast-feeding How should I use this medication? This drug is given as an infusion into a vein. It is administered in a hospital or clinic by a specially trained health care professional. Talk to your pediatrician regarding the use of this medicine in children. Special care may be needed. Overdosage: If you think you have taken too much of this medicine contact a poison control center or emergency room at once. NOTE: This medicine is only for you. Do not share this medicine with others. What if I miss a dose? It is important not to miss your dose. Call your doctor or health care professional if you are unable to keep an appointment. What may interact with this medication? Do not take this medicine with any of the following medications: live virus vaccines This medicine may also interact  with the following medications: aprepitant certain antibiotics like erythromycin or clarithromycin certain antivirals for HIV or hepatitis certain medicines for fungal infections like fluconazole, itraconazole, ketoconazole, posaconazole, or voriconazole cimetidine ciprofloxacin conivaptan cyclosporine dronedarone fluvoxamine grapefruit juice imatinib verapamil This list may not describe all possible interactions. Give your health care provider a list of all the medicines, herbs, non-prescription drugs, or dietary supplements you use. Also tell them if you smoke, drink alcohol, or use illegal drugs. Some items may interact with your medicine. What should I watch for while using this medication? Your condition will be monitored carefully while you are receiving this medicine. You will need important blood work done while you are taking this medicine. Call your doctor or health care professional for advice if you get a fever, chills or sore throat, or other symptoms of a cold or flu. Do not treat yourself. This drug decreases your body's  ability to fight infections. Try to avoid being around people who are sick. Some products may contain alcohol. Ask your health care professional if this medicine contains alcohol. Be sure to tell all health care professionals you are taking this medicine. Certain medicines, like metronidazole and disulfiram, can cause an unpleasant reaction when taken with alcohol. The reaction includes flushing, headache, nausea, vomiting, sweating, and increased thirst. The reaction can last from 30 minutes to several hours. You may get drowsy or dizzy. Do not drive, use machinery, or do anything that needs mental alertness until you know how this medicine affects you. Do not stand or sit up quickly, especially if you are an older patient. This reduces the risk of dizzy or fainting spells. Alcohol may interfere with the effect of this medicine. Talk to your health care  professional about your risk of cancer. You may be more at risk for certain types of cancer if you take this medicine. Do not become pregnant while taking this medicine or for 6 months after stopping it. Women should inform their doctor if they wish to become pregnant or think they might be pregnant. There is a potential for serious side effects to an unborn child. Talk to your health care professional or pharmacist for more information. Do not breast-feed an infant while taking this medicine or for 1 week after stopping it. Males who get this medicine must use a condom during sex with females who can get pregnant. If you get a woman pregnant, the baby could have birth defects. The baby could die before they are born. You will need to continue wearing a condom for 3 months after stopping the medicine. Tell your health care provider right away if your partner becomes pregnant while you are taking this medicine. This may interfere with the ability to father a child. You should talk to your doctor or health care professional if you are concerned about your fertility. What side effects may I notice from receiving this medication? Side effects that you should report to your doctor or health care professional as soon as possible: allergic reactions like skin rash, itching or hives, swelling of the face, lips, or tongue blurred vision breathing problems changes in vision low blood counts - This drug may decrease the number of white blood cells, red blood cells and platelets. You may be at increased risk for infections and bleeding. nausea and vomiting pain, redness or irritation at site where injected pain, tingling, numbness in the hands or feet redness, blistering, peeling, or loosening of the skin, including inside the mouth signs of decreased platelets or bleeding - bruising, pinpoint red spots on the skin, black, tarry stools, nosebleeds signs of decreased red blood cells - unusually weak or tired,  fainting spells, lightheadedness signs of infection - fever or chills, cough, sore throat, pain or difficulty passing urine swelling of the ankle, feet, hands Side effects that usually do not require medical attention (report to your doctor or health care professional if they continue or are bothersome): constipation diarrhea fingernail or toenail changes hair loss loss of appetite mouth sores muscle pain This list may not describe all possible side effects. Call your doctor for medical advice about side effects. You may report side effects to FDA at 1-800-FDA-1088. Where should I keep my medication? This drug is given in a hospital or clinic and will not be stored at home. NOTE: This sheet is a summary. It may not cover all possible information. If you have questions about this  medicine, talk to your doctor, pharmacist, or health care provider.  2023 Elsevier/Gold Standard (2020-11-21 00:00:00)  Carboplatin injection What is this medication? CARBOPLATIN (KAR boe pla tin) is a chemotherapy drug. It targets fast dividing cells, like cancer cells, and causes these cells to die. This medicine is used to treat ovarian cancer and many other cancers. This medicine may be used for other purposes; ask your health care provider or pharmacist if you have questions. COMMON BRAND NAME(S): Paraplatin What should I tell my care team before I take this medication? They need to know if you have any of these conditions: blood disorders hearing problems kidney disease recent or ongoing radiation therapy an unusual or allergic reaction to carboplatin, cisplatin, other chemotherapy, other medicines, foods, dyes, or preservatives pregnant or trying to get pregnant breast-feeding How should I use this medication? This drug is usually given as an infusion into a vein. It is administered in a hospital or clinic by a specially trained health care professional. Talk to your pediatrician regarding the use of  this medicine in children. Special care may be needed. Overdosage: If you think you have taken too much of this medicine contact a poison control center or emergency room at once. NOTE: This medicine is only for you. Do not share this medicine with others. What if I miss a dose? It is important not to miss a dose. Call your doctor or health care professional if you are unable to keep an appointment. What may interact with this medication? medicines for seizures medicines to increase blood counts like filgrastim, pegfilgrastim, sargramostim some antibiotics like amikacin, gentamicin, neomycin, streptomycin, tobramycin vaccines Talk to your doctor or health care professional before taking any of these medicines: acetaminophen aspirin ibuprofen ketoprofen naproxen This list may not describe all possible interactions. Give your health care provider a list of all the medicines, herbs, non-prescription drugs, or dietary supplements you use. Also tell them if you smoke, drink alcohol, or use illegal drugs. Some items may interact with your medicine. What should I watch for while using this medication? Your condition will be monitored carefully while you are receiving this medicine. You will need important blood work done while you are taking this medicine. This drug may make you feel generally unwell. This is not uncommon, as chemotherapy can affect healthy cells as well as cancer cells. Report any side effects. Continue your course of treatment even though you feel ill unless your doctor tells you to stop. In some cases, you may be given additional medicines to help with side effects. Follow all directions for their use. Call your doctor or health care professional for advice if you get a fever, chills or sore throat, or other symptoms of a cold or flu. Do not treat yourself. This drug decreases your body's ability to fight infections. Try to avoid being around people who are sick. This medicine may  increase your risk to bruise or bleed. Call your doctor or health care professional if you notice any unusual bleeding. Be careful brushing and flossing your teeth or using a toothpick because you may get an infection or bleed more easily. If you have any dental work done, tell your dentist you are receiving this medicine. Avoid taking products that contain aspirin, acetaminophen, ibuprofen, naproxen, or ketoprofen unless instructed by your doctor. These medicines may hide a fever. Do not become pregnant while taking this medicine. Women should inform their doctor if they wish to become pregnant or think they might be pregnant. There is  a potential for serious side effects to an unborn child. Talk to your health care professional or pharmacist for more information. Do not breast-feed an infant while taking this medicine. What side effects may I notice from receiving this medication? Side effects that you should report to your doctor or health care professional as soon as possible: allergic reactions like skin rash, itching or hives, swelling of the face, lips, or tongue signs of infection - fever or chills, cough, sore throat, pain or difficulty passing urine signs of decreased platelets or bleeding - bruising, pinpoint red spots on the skin, black, tarry stools, nosebleeds signs of decreased red blood cells - unusually weak or tired, fainting spells, lightheadedness breathing problems changes in hearing changes in vision chest pain high blood pressure low blood counts - This drug may decrease the number of white blood cells, red blood cells and platelets. You may be at increased risk for infections and bleeding. nausea and vomiting pain, swelling, redness or irritation at the injection site pain, tingling, numbness in the hands or feet problems with balance, talking, walking trouble passing urine or change in the amount of urine Side effects that usually do not require medical attention (report  to your doctor or health care professional if they continue or are bothersome): hair loss loss of appetite metallic taste in the mouth or changes in taste This list may not describe all possible side effects. Call your doctor for medical advice about side effects. You may report side effects to FDA at 1-800-FDA-1088. Where should I keep my medication? This drug is given in a hospital or clinic and will not be stored at home. NOTE: This sheet is a summary. It may not cover all possible information. If you have questions about this medicine, talk to your doctor, pharmacist, or health care provider.  2023 Elsevier/Gold Standard (2007-05-31 00:00:00)

## 2021-06-19 ENCOUNTER — Other Ambulatory Visit: Payer: Federal, State, Local not specified - PPO

## 2021-06-19 ENCOUNTER — Encounter: Payer: Self-pay | Admitting: *Deleted

## 2021-06-19 ENCOUNTER — Ambulatory Visit
Admission: RE | Admit: 2021-06-19 | Discharge: 2021-06-19 | Disposition: A | Discharge status: Home / Self Care | Payer: Federal, State, Local not specified - PPO | Source: Ambulatory Visit | Attending: Hematology and Oncology | Admitting: Hematology and Oncology

## 2021-06-19 ENCOUNTER — Telehealth: Payer: Self-pay

## 2021-06-19 DIAGNOSIS — C50411 Malignant neoplasm of upper-outer quadrant of right female breast: Secondary | ICD-10-CM | POA: Diagnosis not present

## 2021-06-19 DIAGNOSIS — Z171 Estrogen receptor negative status [ER-]: Secondary | ICD-10-CM

## 2021-06-19 DIAGNOSIS — N6021 Fibroadenosis of right breast: Secondary | ICD-10-CM | POA: Diagnosis not present

## 2021-06-19 DIAGNOSIS — R928 Other abnormal and inconclusive findings on diagnostic imaging of breast: Secondary | ICD-10-CM

## 2021-06-19 DIAGNOSIS — C50211 Malignant neoplasm of upper-inner quadrant of right female breast: Secondary | ICD-10-CM | POA: Diagnosis not present

## 2021-06-19 IMAGING — MG MM BREAST LOCALIZATION CLIP
4 series · 4 of 12 positions shown · non-contrast
Comparison: Previous exam(s).

CLINICAL DATA: Assess post biopsy marker clip placements following
MRI guided core needle biopsy of 2 areas of non mass enhancement in
the right breast.

EXAM:
3D DIAGNOSTIC RIGHT MAMMOGRAM POST MRI BIOPSY

[R ML synth-2D]
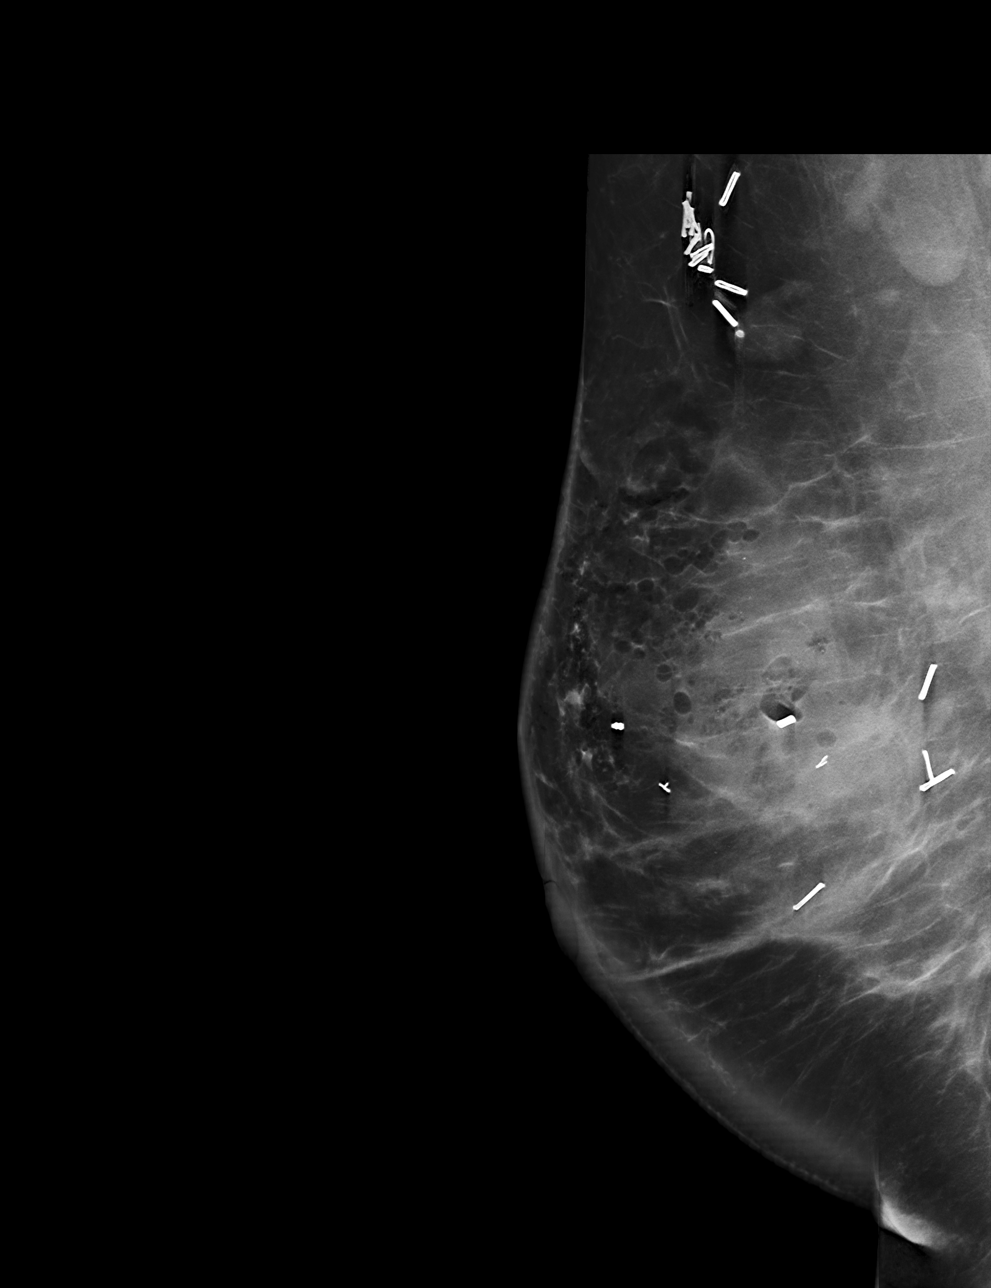

[R CC synth-2D]
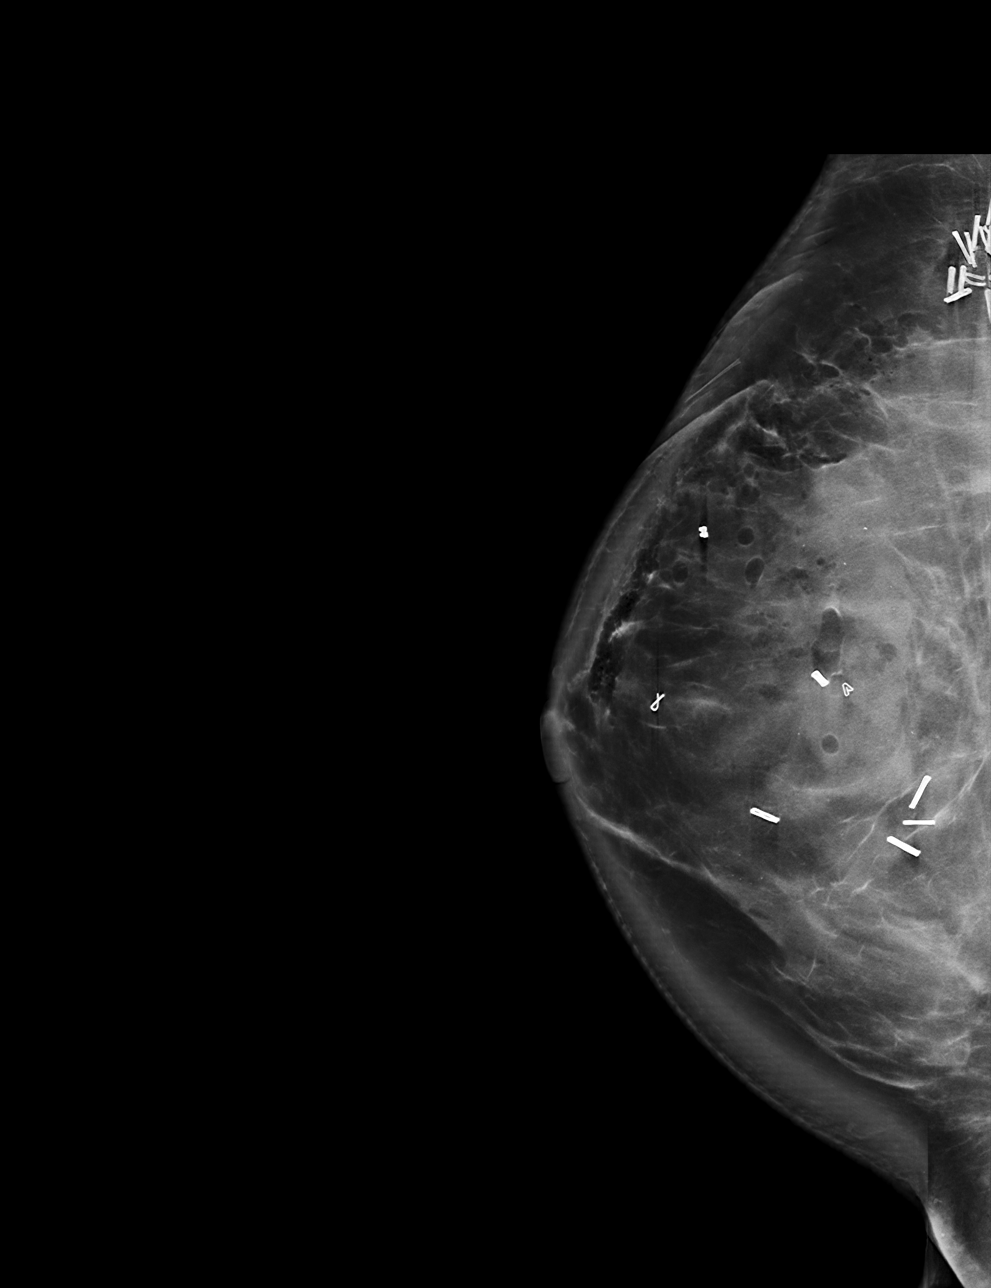

[R CC tomo · tomo slice 52/103.0]
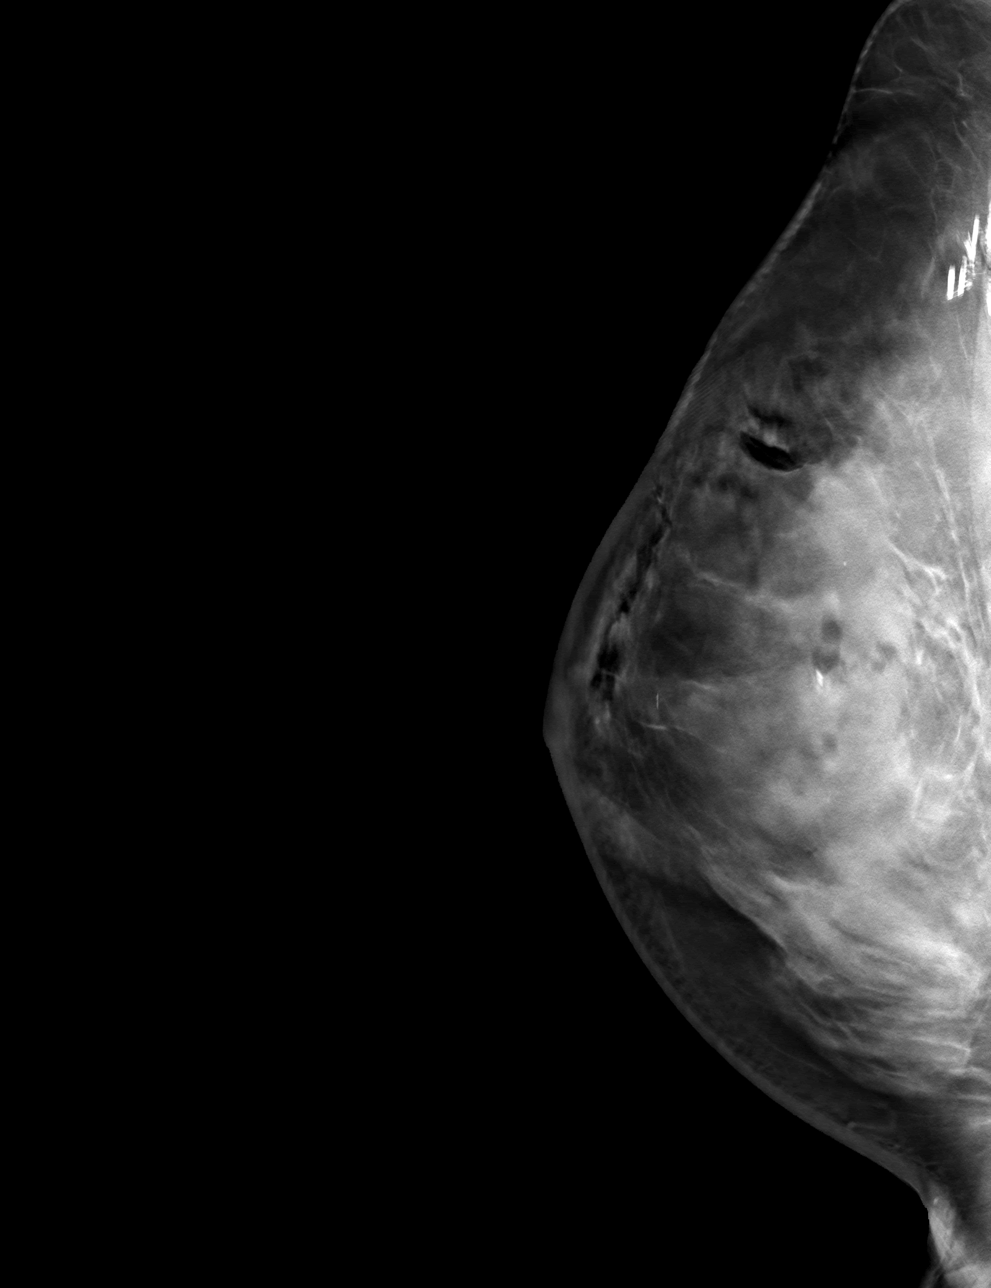

[R ML tomo · tomo slice 53/105.0]
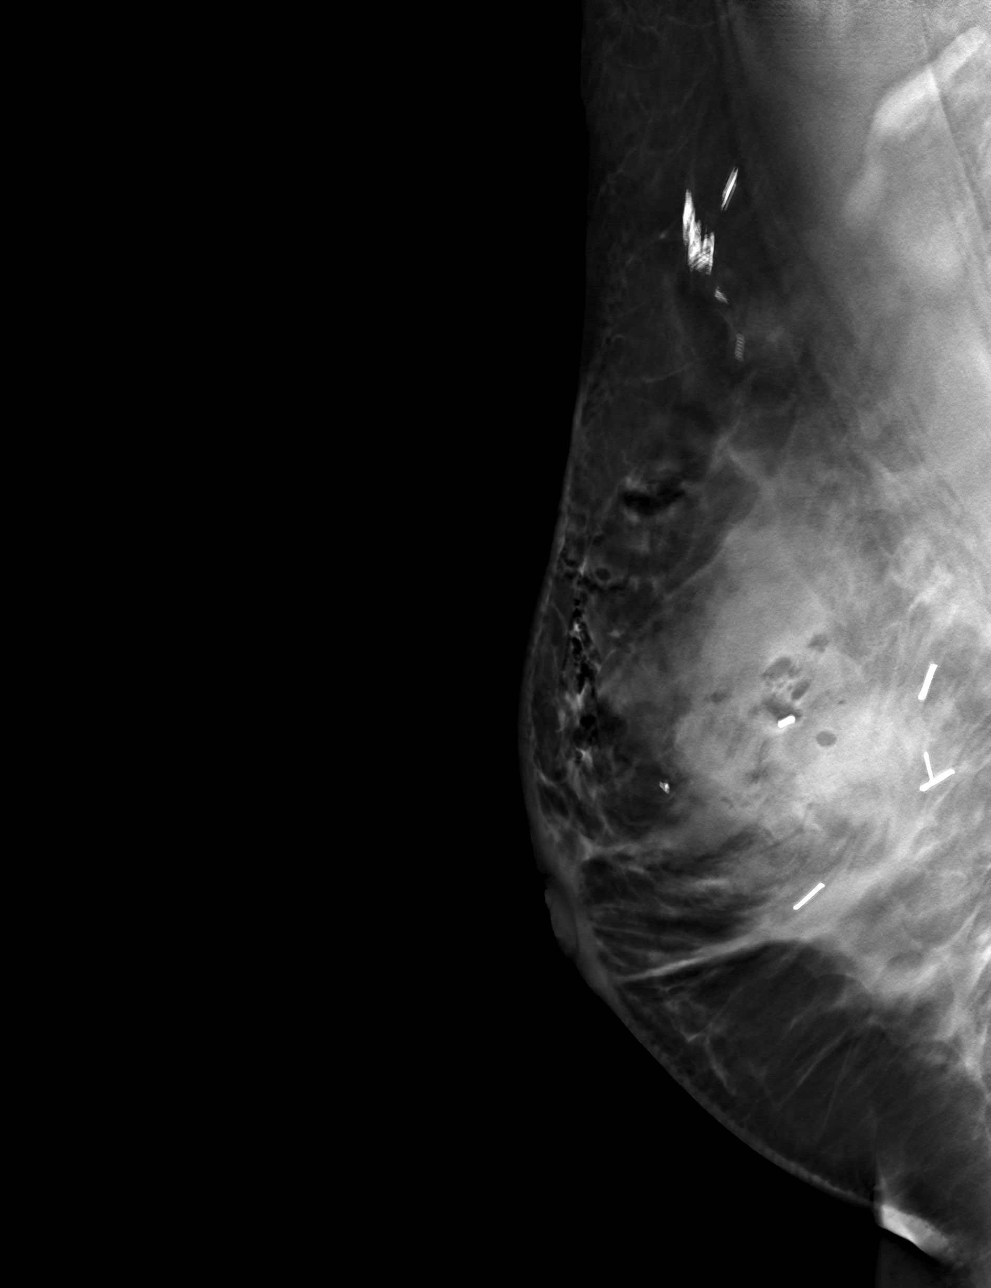

[4 of 12 positions shown; findings below may reference images not displayed]

FINDINGS: 3D Mammographic images were obtained following MRI guided biopsy of
right breast. The barbell shaped post biopsy marker clip lies in the
upper outer right breast. The cylinder shaped post biopsy marker
clip lies in the central, slightly upper outer right breast.

There is a wing shaped post biopsy marker clip within the mass in
the central right breast and a ribbon shaped post biopsy marker clip
anterior to this, between the wing and cylinder biopsy clips and the
nipple.
IMPRESSION: Appropriate positioning of the barbell and cylinder shaped biopsy
marking clips at the site of biopsy in the right breast as detailed.

Final Assessment: Post Procedure Mammograms for Marker Placement

## 2021-06-19 IMAGING — MR MR BREAST BX W/ LOC DEV 1ST LEASION IMAGE BX SPEC MR GUIDE*R*
7 of 10 series · 30 of 48 positions shown · IV contrast (gadavist)
Comparison: Prior exams including the previous breast MRI dated
[DATE].
COMPARISON: Prior exams including the previous breast MRI dated
[DATE].

Addendum:
CLINICAL DATA: Patient with recently diagnosed invasive right
breast carcinoma. She presents for MRI guided biopsy of 2 areas
abnormal right breast enhancement.

EXAM:
MRI GUIDED CORE NEEDLE BIOPSY OF THE RIGHT BREAST: 2 RIGHT BREAST
BIOPSIES PERFORMED.
TECHNIQUE: Multiplanar, multisequence MR imaging of the right breast was
performed both before and after administration of intravenous
contrast.
CONTRAST:  8 mL of Gadavist intravenous contrast.

[Series 2: fiducial unilateral · sagittal · 2.0mm · 1.33mm/px · 3 of 52 slices shown]
[im 1/52]
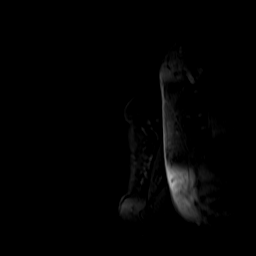
[im 26/52]
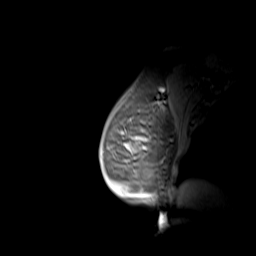
[im 52/52]
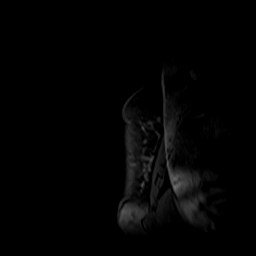

[Series 3: dynamic pre · axial · non-contrast · 1.3mm · 0.73mm/px · z∈[-127,+101]mm · 5 of 176 slices shown]
[im 1/176]
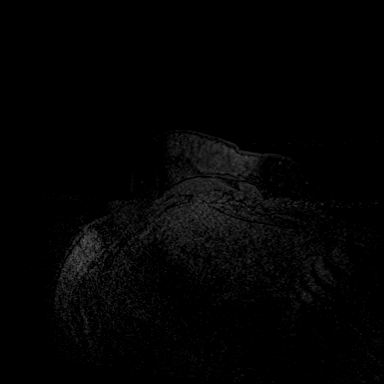
[im 44/176]
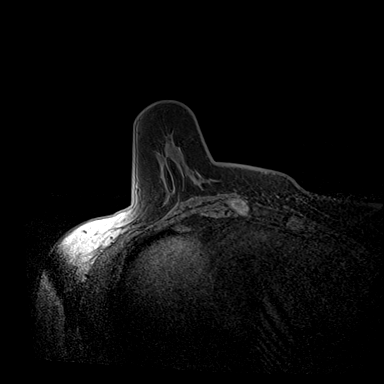
[im 88/176]
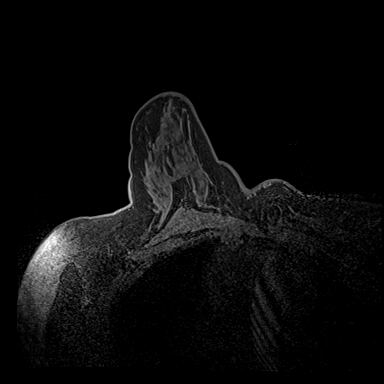
[im 132/176]
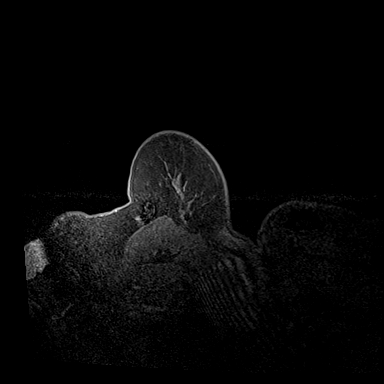
[im 176/176]
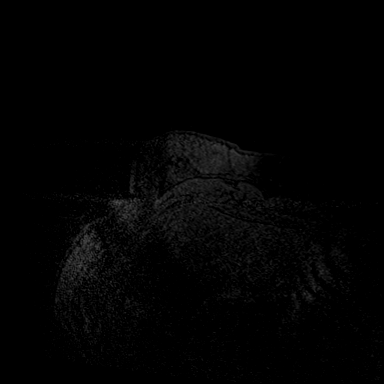

[Series 4: dynamic post 20 · axial · 1.3mm · 0.73mm/px · z∈[-127,+101]mm · 5 of 176 slices shown (1 of 2)]
[im 1/176]
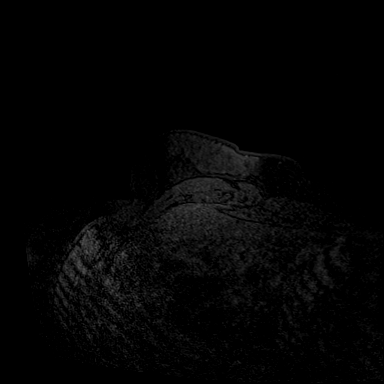
[im 44/176]
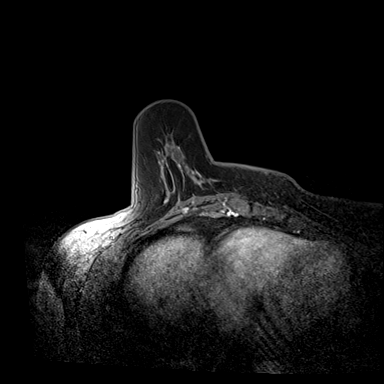
[im 88/176]
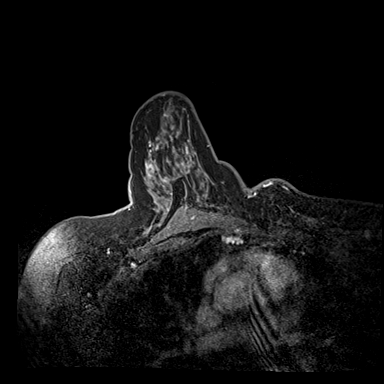
[im 132/176]
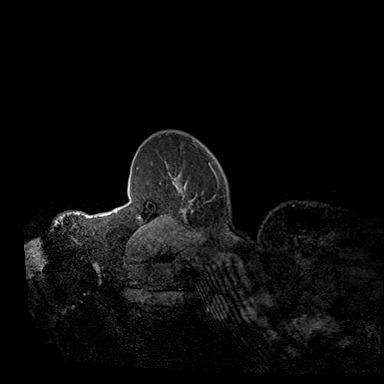
[im 176/176]
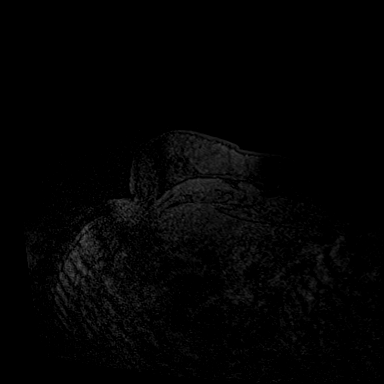

[Series 5: dynamic post 20 · axial · 1.3mm · 0.73mm/px · z∈[-127,+101]mm · 5 of 176 slices shown (2 of 2)]
[im 1/176]
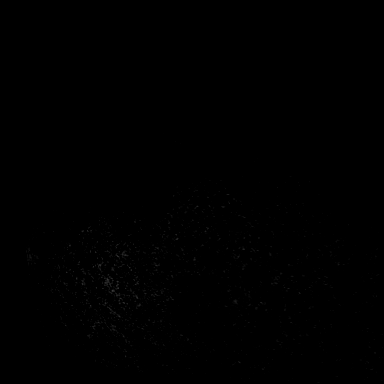
[im 44/176]
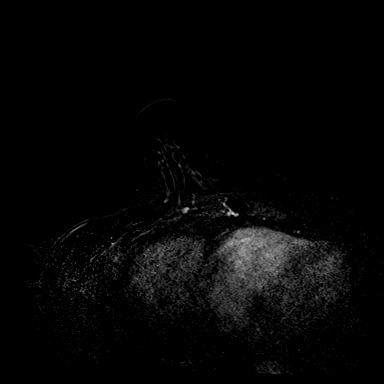
[im 88/176]
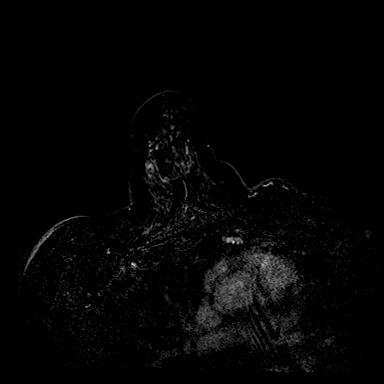
[im 132/176]
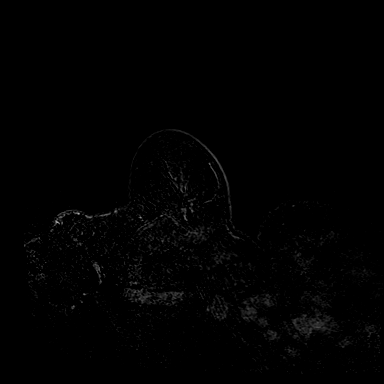
[im 176/176]
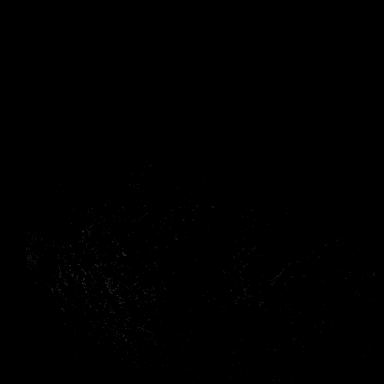

[Series 6: dynamic post 3 · axial · 1.3mm · 0.73mm/px · z∈[-127,+101]mm · 5 of 176 slices shown (1 of 2)]
[im 1/176]
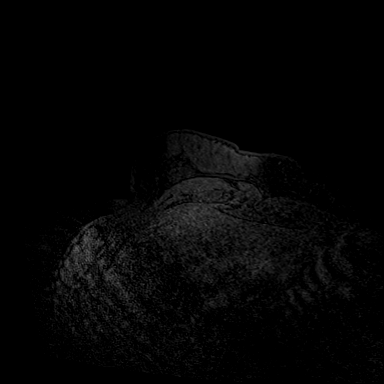
[im 44/176]
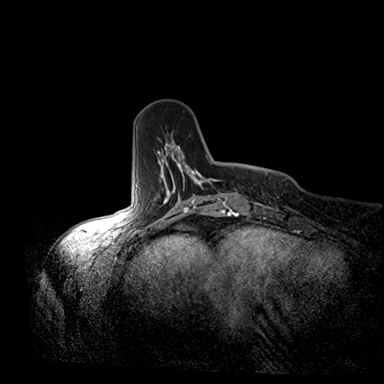
[im 88/176]
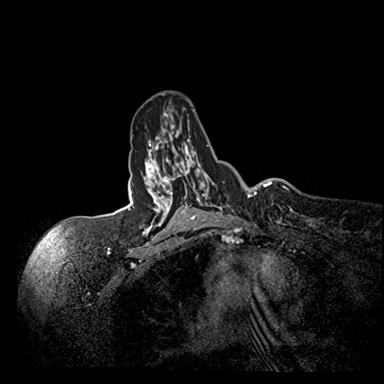
[im 132/176]
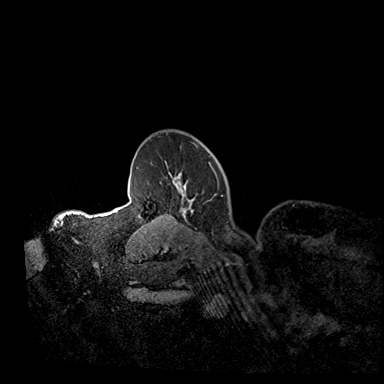
[im 176/176]
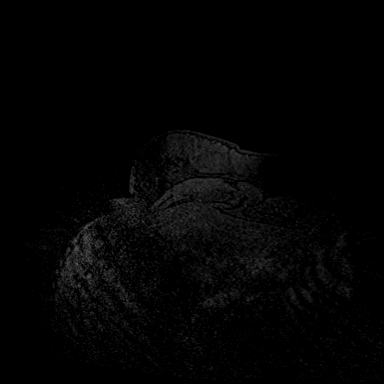

[Series 7: dynamic post 3 · axial · 1.3mm · 0.73mm/px · z∈[-127,+101]mm · 5 of 176 slices shown (2 of 2)]
[im 1/176]
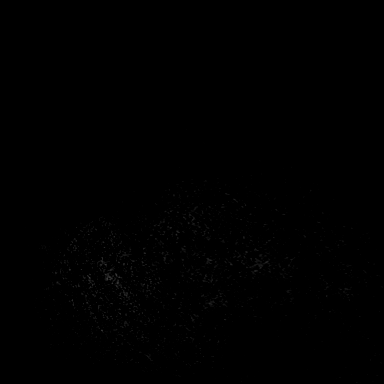
[im 44/176]
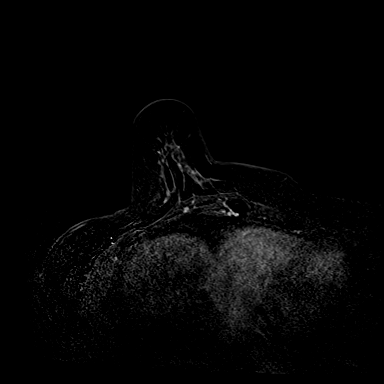
[im 88/176]
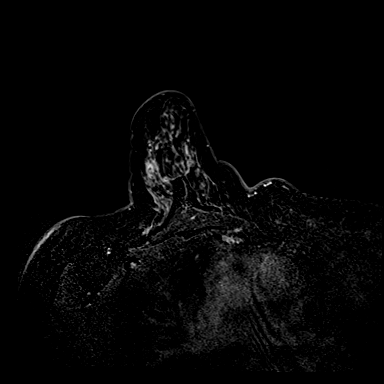
[im 132/176]
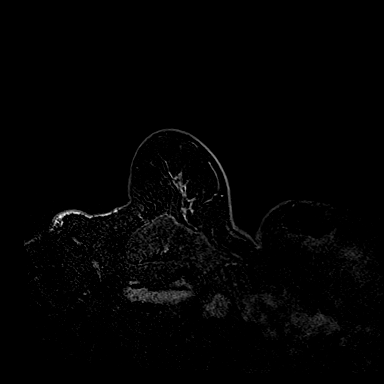
[im 176/176]
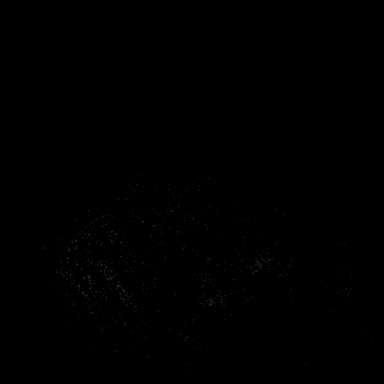

[Series 8: needle confirmation · axial · 1.3mm · 0.73mm/px · z∈[-127,-71]mm · 2 of 176 slices shown]
[im 1/176]
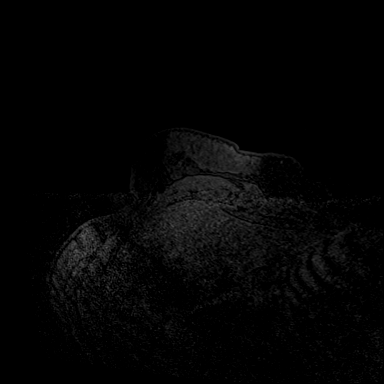
[im 44/176]
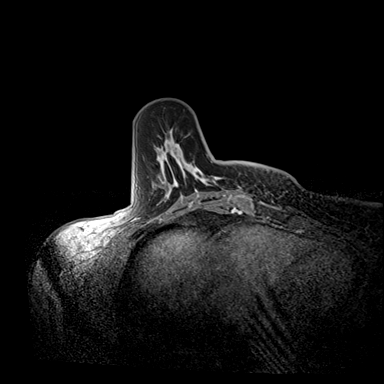

[30 of 48 positions shown; findings below may reference images not displayed]

FINDINGS: I met with the patient, and we discussed the procedure of MRI guided
biopsy, including risks, benefits, and alternatives. Specifically,
we discussed the risks of infection, bleeding, tissue injury, clip
migration, and inadequate sampling. Informed, written consent was
given. The usual time out protocol was performed immediately prior
to the procedure.

Biopsy #1: Upper, Outer quadrant.

Using sterile technique, 1% Lidocaine, MRI guidance, and a 9 gauge
vacuum assisted device, biopsy was performed of abnormal enhancement
anterior and superior to the known breast carcinoma in the upper
outer quadrant using a lateral approach. At the conclusion of the
procedure, a a barbell shaped tissue marker clip was deployed into
the biopsy cavity.

Biopsy #: Upper, Inner quadrant.

Using sterile technique, 1% Lidocaine, MRI guidance, and a 9 gauge
vacuum assisted device, biopsy was performed of abnormal enhancement
in the more posterior, upper inner quadrant, using a lateral
approach. At the conclusion of the procedure, a cylinder shaped
tissue marker clip was deployed into the biopsy cavity.

Follow-up 2-view mammogram was performed and dictated separately.
IMPRESSION: MRI guided biopsy of 2 areas of abnormal enhancement in the right
breast. No apparent complications.

ADDENDUM:
Pathology revealed BENIGN BREAST TISSUE WITH A SMALL RADIAL SCAR AND
BACKGROUND ADENOSIS of the RIGHT breast, upper outer quadrant,
(barbell clip). This was found to be concordant by Dr. NATAREN
NATAREN, with excision recommended.

Pathology revealed INVASIVE DUCTAL CARCINOMA, CONSISTENT WITH THE
PATIENT'S KNOWN GRADE 3 INVASIVE DUCTAL CARCINOMA of the RIGHT
breast, upper inner quadrant, (cylinder clip). This was found to be
concordant by Dr. NATAREN.

Pathology revealed SCLEROSED PAPILLOMA WITH USUAL DUCT HYPERPLASIA,
ADJACENT FIBROCYSTIC CHANGES INCLUDING STROMAL FIBROSIS AND
ADENOSIS, MICROCALCIFICATIONS PRESENT WITHIN THE PAPILLOMA AND
ADENOSIS of the LEFT breast, subareolar (barbell clip). This was
found to be concordant by Dr. NATAREN, with excision
recommended.

Pathology revealed BENIGN FIBROMATOID NODULE, PROLIFERATIVE
FIBROCYSTIC CHANGES INCLUDING CYSTIC DILATATION OF DUCTS, APOCRINE
METAPLASIA, STROMAL FIBROSIS, ADENOSIS, SCLEROSING ADENOSIS AND
USUAL DUCT HYPERPLASIA, RARE MICROCALCIFICATIONS WITHIN ADENOSIS of
the LEFT breast, lower outer quadrant, (cylinder clip). This was
found to be concordant by Dr. NATAREN.

Pathology results were discussed with the patient by telephone. The
patient reported doing well after the biopsies with tenderness at
all sites, and more swelling on the LEFT. Post biopsy instructions
and care were reviewed and questions were answered. The patient was
encouraged to call The [REDACTED] for any
additional concerns. My direct phone number was provided.

The patient has a recent diagnosis of RIGHT breast cancer and should
follow her outlined treatment plan.

Dr. NATAREN and Dr. NATAREN were notified of biopsy
results via [REDACTED] message on [DATE].

Pathology results reported by NATAREN, RN on [DATE].

*** End of Addendum ***
FINDINGS: I met with the patient, and we discussed the procedure of MRI guided
biopsy, including risks, benefits, and alternatives. Specifically,
we discussed the risks of infection, bleeding, tissue injury, clip
migration, and inadequate sampling. Informed, written consent was
given. The usual time out protocol was performed immediately prior
to the procedure.

Biopsy #1: Upper, Outer quadrant.

Using sterile technique, 1% Lidocaine, MRI guidance, and a 9 gauge
vacuum assisted device, biopsy was performed of abnormal enhancement
anterior and superior to the known breast carcinoma in the upper
outer quadrant using a lateral approach. At the conclusion of the
procedure, a a barbell shaped tissue marker clip was deployed into
the biopsy cavity.

Biopsy #: Upper, Inner quadrant.

Using sterile technique, 1% Lidocaine, MRI guidance, and a 9 gauge
vacuum assisted device, biopsy was performed of abnormal enhancement
in the more posterior, upper inner quadrant, using a lateral
approach. At the conclusion of the procedure, a cylinder shaped
tissue marker clip was deployed into the biopsy cavity.

Follow-up 2-view mammogram was performed and dictated separately.
IMPRESSION: MRI guided biopsy of 2 areas of abnormal enhancement in the right
breast. No apparent complications.

## 2021-06-19 MED ORDER — GADOBUTROL 1 MMOL/ML IV SOLN
8.0000 mL | Freq: Once | INTRAVENOUS | Status: AC | PRN
Start: 2021-06-19 — End: 2021-06-19
  Administered 2021-06-19: 8 mL via INTRAVENOUS

## 2021-06-19 NOTE — Telephone Encounter (Signed)
-----   Message from Tildon Husky, RN sent at 06/18/2021  4:20 PM EDT ----- Regarding: first time treatment call back - [place] Patient received treatment for the first time today. She is seen by DR Lindi Adie. She receives TCHP. Treatment went great!!

## 2021-06-19 NOTE — Telephone Encounter (Signed)
Patricia Houston states that she had a little nausea this am. She used the compazine with good effect. She is eating, drinking, and urinating well. She knows to call the office at [phone] if she has any questions or concerns.

## 2021-06-20 ENCOUNTER — Other Ambulatory Visit: Payer: Self-pay

## 2021-06-20 ENCOUNTER — Inpatient Hospital Stay: Payer: Federal, State, Local not specified - PPO

## 2021-06-20 VITALS — BP 139/78 | HR 90 | Temp 98.2°F

## 2021-06-20 DIAGNOSIS — R197 Diarrhea, unspecified: Secondary | ICD-10-CM | POA: Diagnosis not present

## 2021-06-20 DIAGNOSIS — Z171 Estrogen receptor negative status [ER-]: Secondary | ICD-10-CM | POA: Diagnosis not present

## 2021-06-20 DIAGNOSIS — M898X9 Other specified disorders of bone, unspecified site: Secondary | ICD-10-CM | POA: Diagnosis not present

## 2021-06-20 DIAGNOSIS — C50411 Malignant neoplasm of upper-outer quadrant of right female breast: Secondary | ICD-10-CM | POA: Diagnosis not present

## 2021-06-20 DIAGNOSIS — R102 Pelvic and perineal pain: Secondary | ICD-10-CM | POA: Diagnosis not present

## 2021-06-20 DIAGNOSIS — Z79899 Other long term (current) drug therapy: Secondary | ICD-10-CM | POA: Diagnosis not present

## 2021-06-20 DIAGNOSIS — Z7952 Long term (current) use of systemic steroids: Secondary | ICD-10-CM | POA: Diagnosis not present

## 2021-06-20 DIAGNOSIS — R5383 Other fatigue: Secondary | ICD-10-CM | POA: Diagnosis not present

## 2021-06-20 MED ORDER — PEGFILGRASTIM-CBQV 6 MG/0.6ML ~~LOC~~ SOSY
6.0000 mg | PREFILLED_SYRINGE | Freq: Once | SUBCUTANEOUS | Status: AC
  Administered 2021-06-20: 6 mg via SUBCUTANEOUS
  Filled 2021-06-20: qty 0.6

## 2021-06-20 NOTE — Patient Instructions (Signed)

## 2021-06-22 ENCOUNTER — Ambulatory Visit
Admission: RE | Admit: 2021-06-22 | Discharge: 2021-06-22 | Disposition: A | Discharge status: Home / Self Care | Payer: Federal, State, Local not specified - PPO | Source: Ambulatory Visit | Attending: Hematology and Oncology | Admitting: Hematology and Oncology

## 2021-06-22 ENCOUNTER — Encounter: Payer: Self-pay | Admitting: *Deleted

## 2021-06-22 ENCOUNTER — Telehealth: Payer: Self-pay | Admitting: Radiology

## 2021-06-22 DIAGNOSIS — R928 Other abnormal and inconclusive findings on diagnostic imaging of breast: Secondary | ICD-10-CM

## 2021-06-22 DIAGNOSIS — Z171 Estrogen receptor negative status [ER-]: Secondary | ICD-10-CM

## 2021-06-22 DIAGNOSIS — D242 Benign neoplasm of left breast: Secondary | ICD-10-CM | POA: Diagnosis not present

## 2021-06-22 DIAGNOSIS — N6342 Unspecified lump in left breast, subareolar: Secondary | ICD-10-CM | POA: Diagnosis not present

## 2021-06-22 DIAGNOSIS — N6012 Diffuse cystic mastopathy of left breast: Secondary | ICD-10-CM | POA: Diagnosis not present

## 2021-06-22 DIAGNOSIS — N6323 Unspecified lump in the left breast, lower outer quadrant: Secondary | ICD-10-CM | POA: Diagnosis not present

## 2021-06-22 IMAGING — MG MM BREAST LOCALIZATION CLIP
4 series · 4 of 12 positions shown · non-contrast
Comparison: Previous exam(s).

CLINICAL DATA: Status post MRI biopsy.

EXAM:
3D DIAGNOSTIC LEFT MAMMOGRAM POST MRI BIOPSY

[L ML synth-2D]
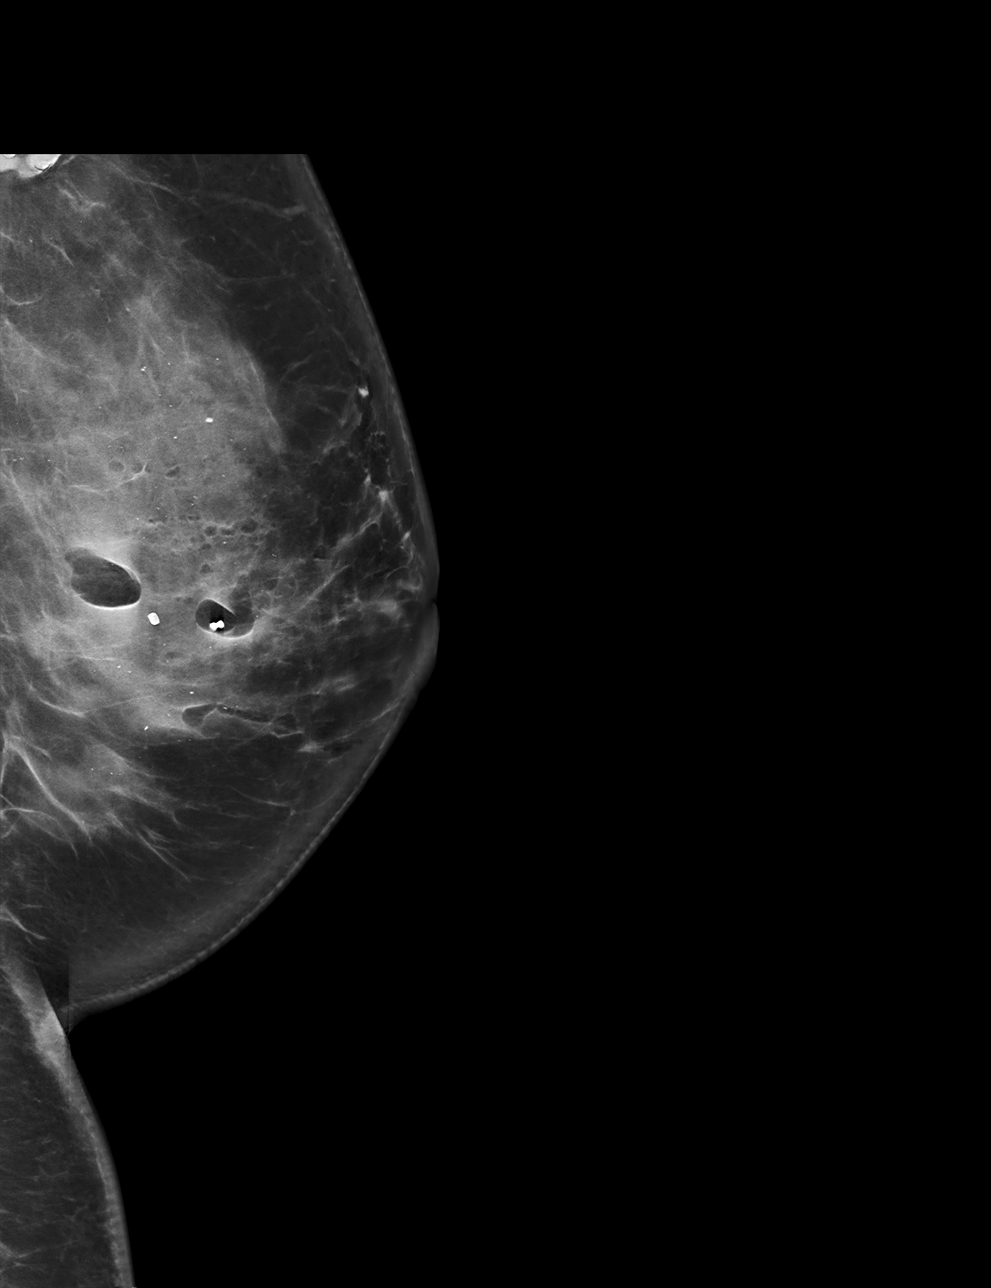

[L CC synth-2D]
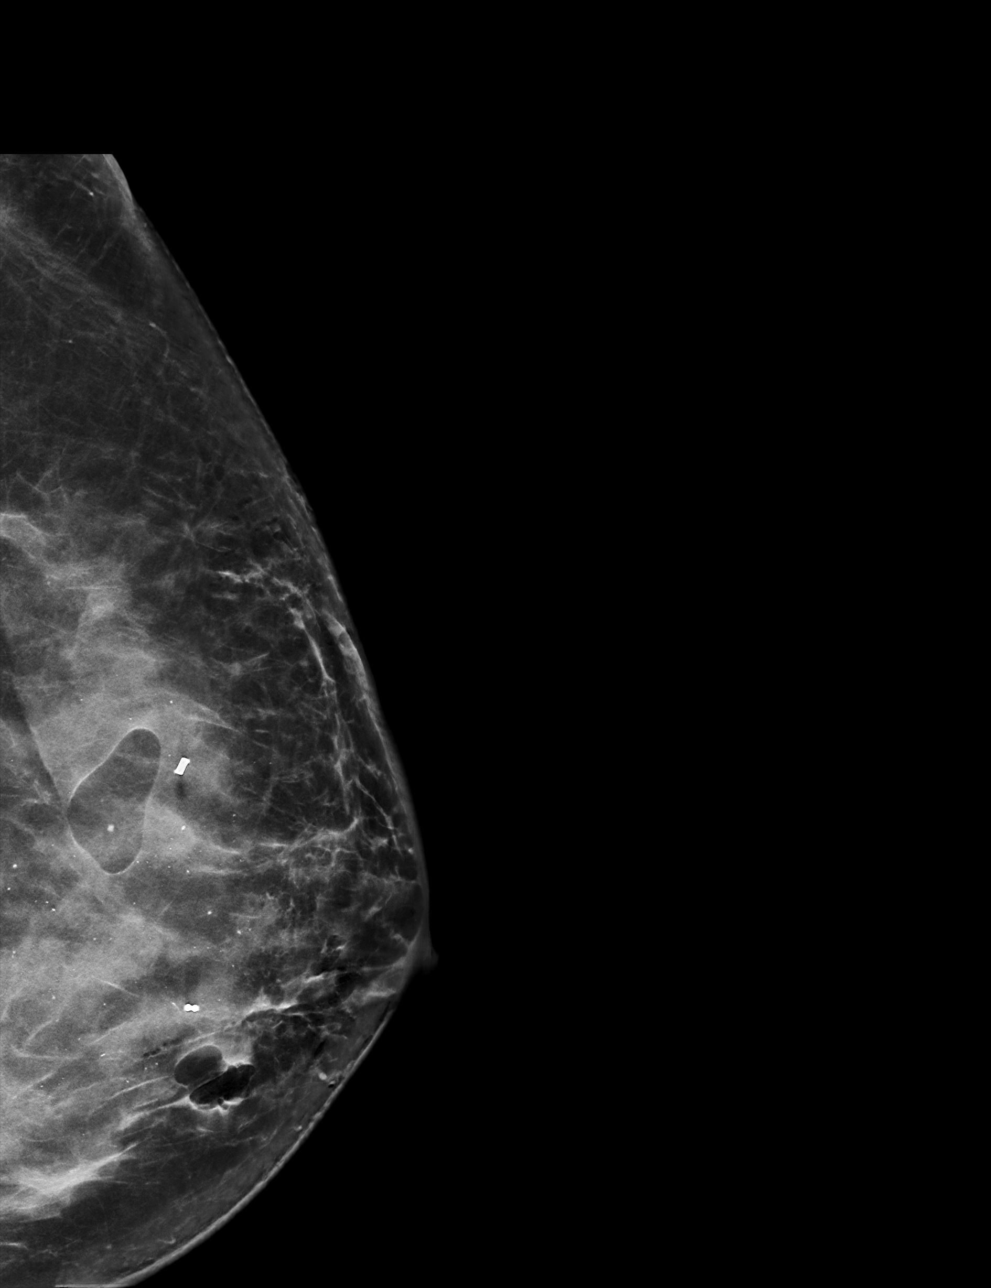

[L ML tomo · tomo slice 43/85.0]
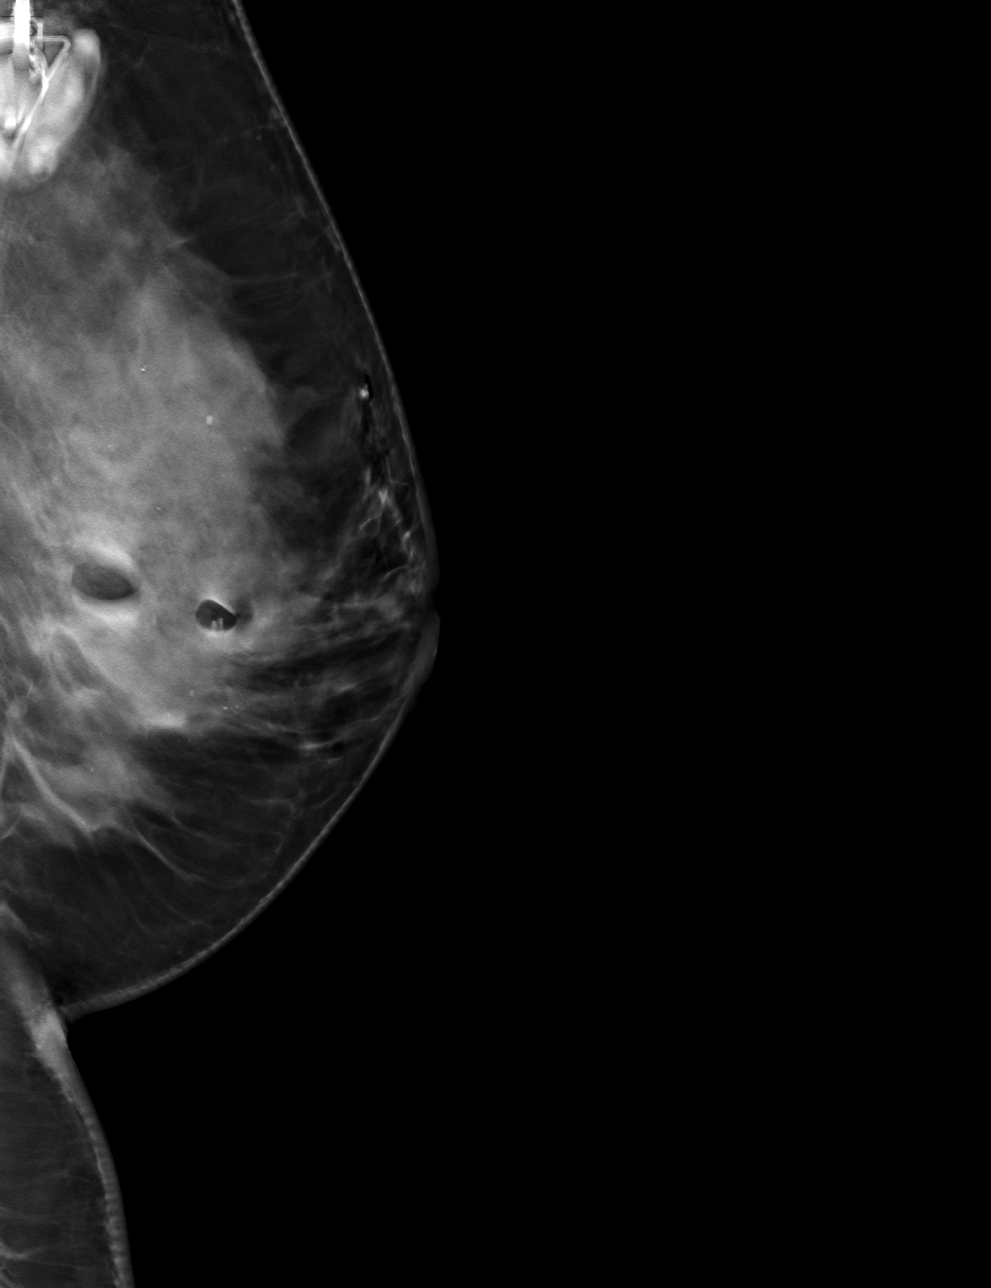

[L CC tomo · tomo slice 37/72.0]
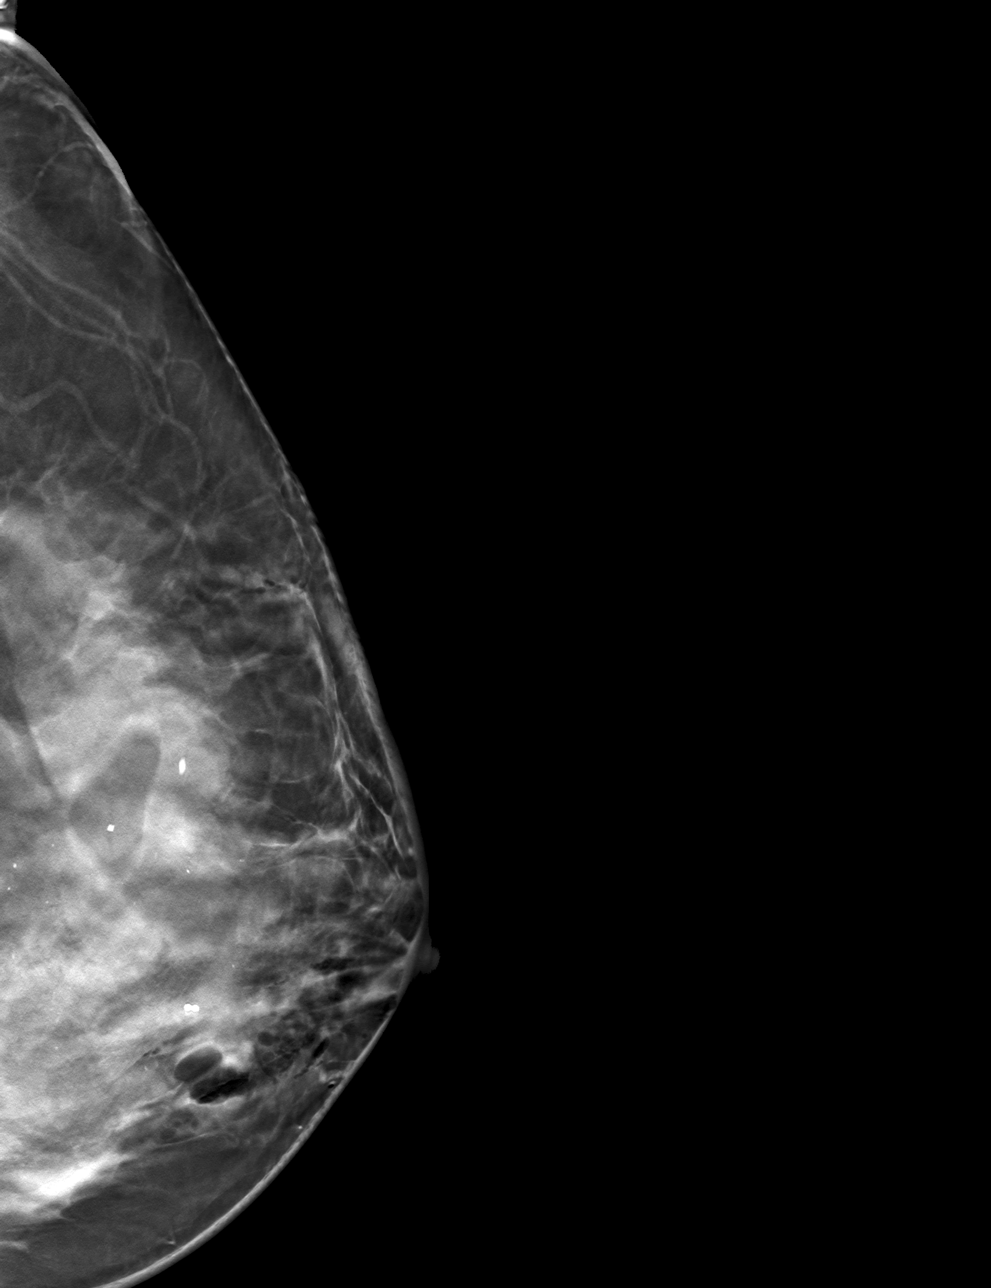

[4 of 12 positions shown; findings below may reference images not displayed]

FINDINGS: 3D Mammographic images were obtained following MRI guided biopsy of
the left breast x2. Both biopsy marking clips are in the expected
position.
IMPRESSION: Expected positioning of 2 left breast post biopsy marking clips.

Final Assessment: Post Procedure Mammograms for Marker Placement

## 2021-06-22 IMAGING — MR MR BREAST BX W LOC DEV 1ST LESION IMAGE BX SPEC MR GUIDE*L*
6 of 7 series · 34 of 48 positions shown · IV contrast (gadavist)
Comparison: None Available.
COMPARISON: None Available.

Addendum:
CLINICAL DATA: 55-year-old female with newly diagnosed right breast
cancer presents for biopsy of indeterminate findings in the left
breast.

EXAM:
MRI GUIDED CORE NEEDLE BIOPSY OF THE LEFT BREAST x2
TECHNIQUE: Multiplanar, multisequence MR imaging of the left breast was
performed both before and after administration of intravenous
contrast.
CONTRAST:  10mL GADAVIST GADOBUTROL 1 MMOL/ML IV SOLN

[Series 4: fiducial unilateral · sagittal · 2.0mm · 1.33mm/px · 1 of 60 slices shown]
[im 1/60]
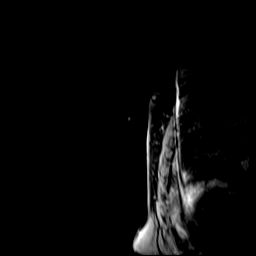

[Series 5: dynamic pre · axial · non-contrast · 1.3mm · 0.80mm/px · z∈[-131,+138]mm · 7 of 208 slices shown]
[im 1/208]
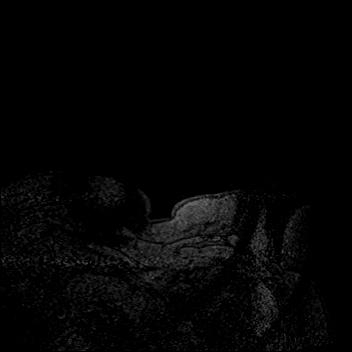
[im 35/208]
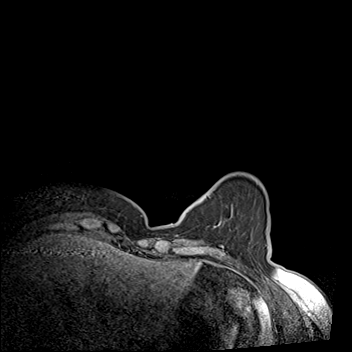
[im 70/208]
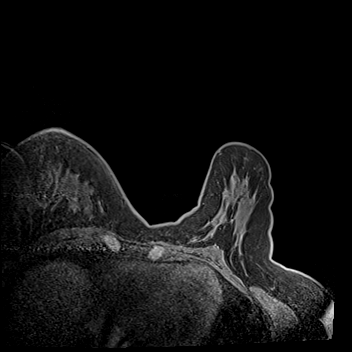
[im 104/208]
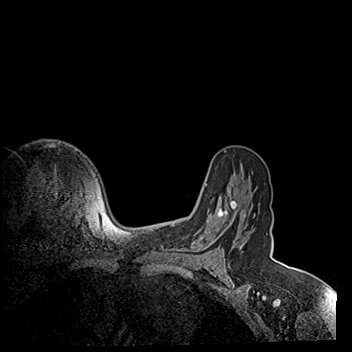
[im 139/208]
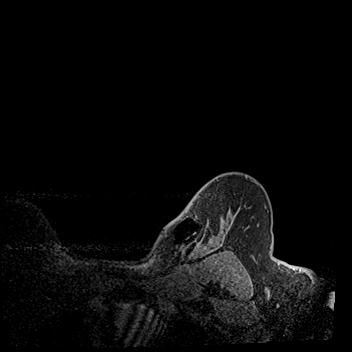
[im 173/208]
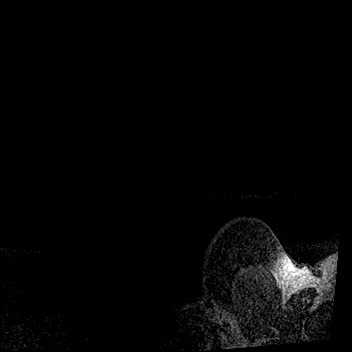
[im 208/208]
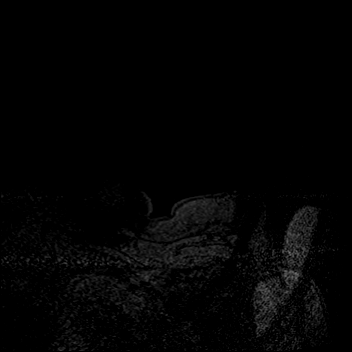

[Series 7: dynamic post 20 · axial · 1.3mm · 0.80mm/px · z∈[-131,+138]mm · 8 of 208 slices shown]
[im 1/208]
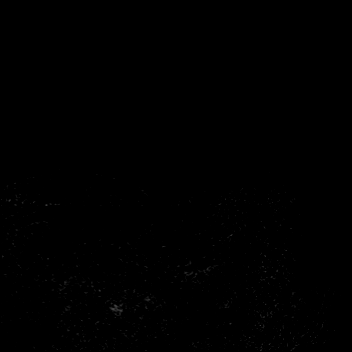
[im 30/208]
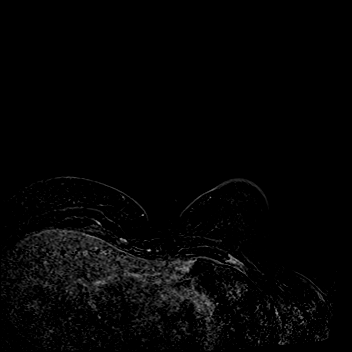
[im 60/208]
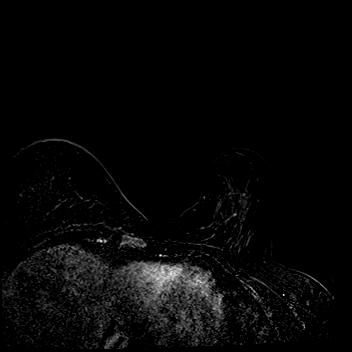
[im 89/208]
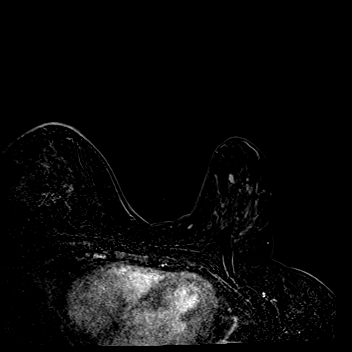
[im 119/208]
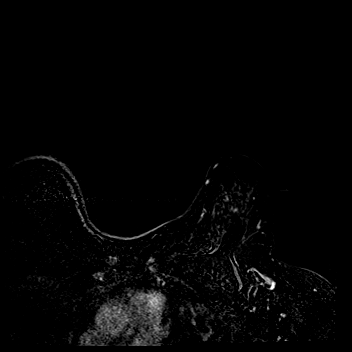
[im 148/208]
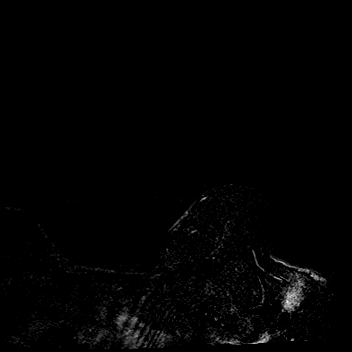
[im 178/208]
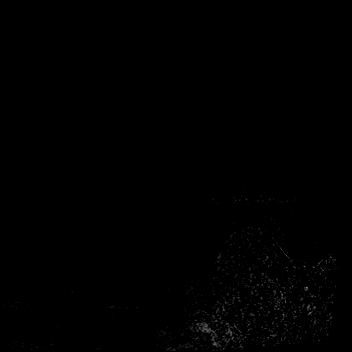
[im 208/208]
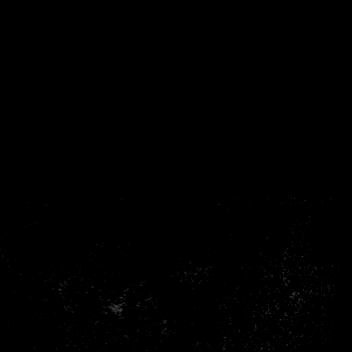

[Series 9: dynamic post 3 · axial · 1.3mm · 0.80mm/px · z∈[-131,+138]mm · 8 of 208 slices shown]
[im 1/208]
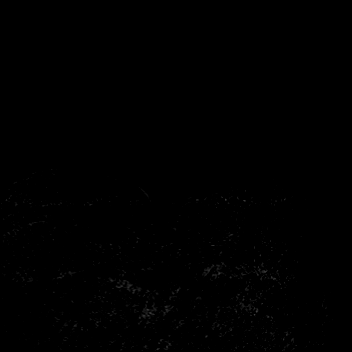
[im 30/208]
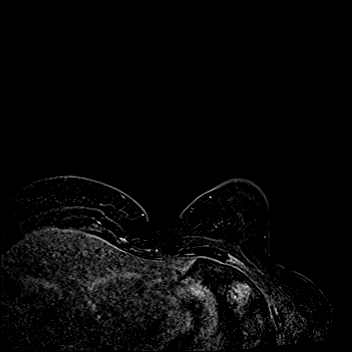
[im 60/208]
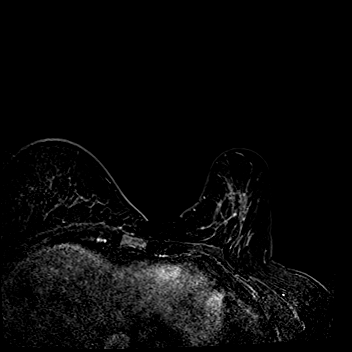
[im 89/208]
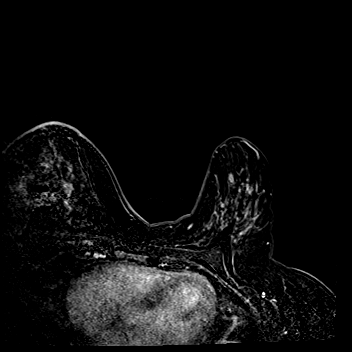
[im 119/208]
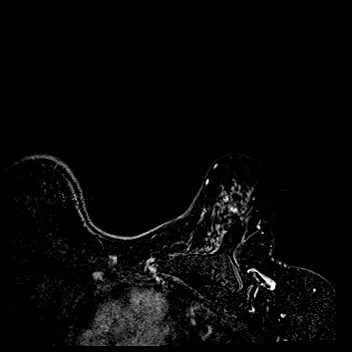
[im 148/208]
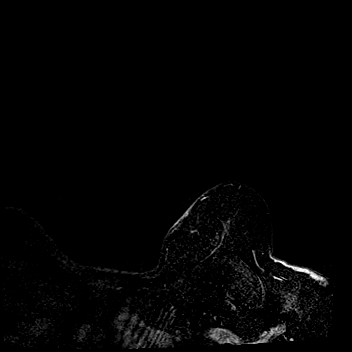
[im 178/208]
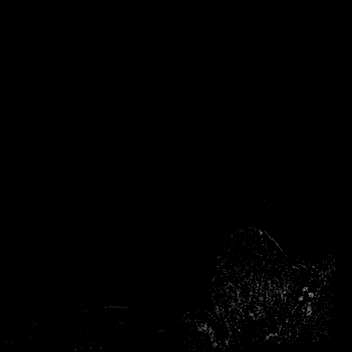
[im 208/208]
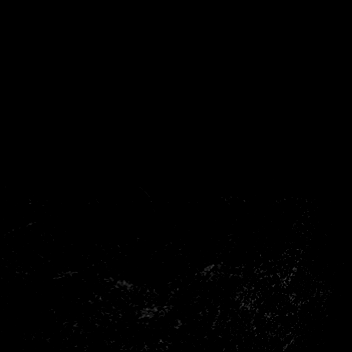

[Series 11: needle confirmation_sub · axial · 1.3mm · 0.80mm/px · z∈[-131,+138]mm · 8 of 208 slices shown]
[im 1/208]
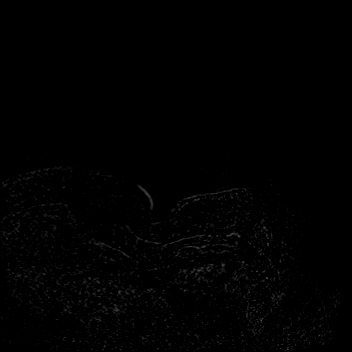
[im 30/208]
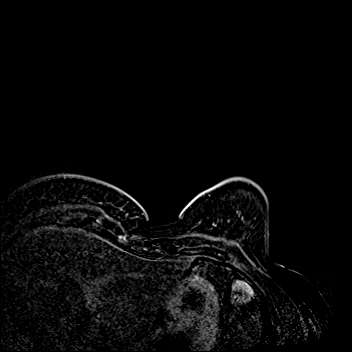
[im 60/208]
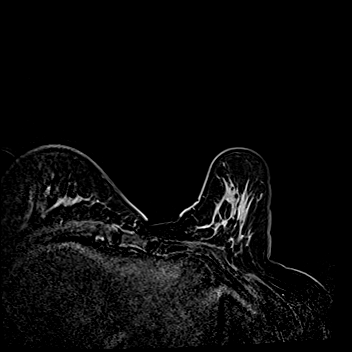
[im 89/208]
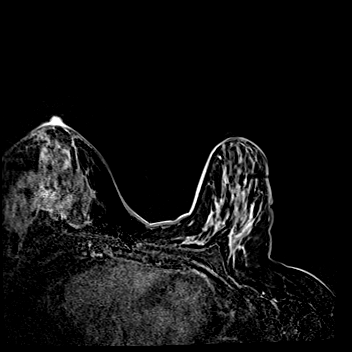
[im 119/208]
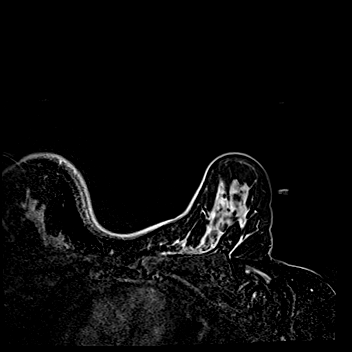
[im 148/208]
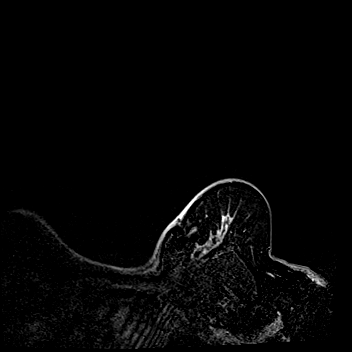
[im 178/208]
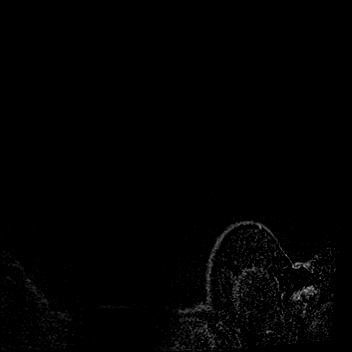
[im 208/208]
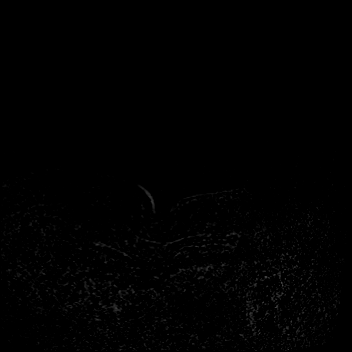

[Series 12: post bx · axial · 1.3mm · 0.80mm/px · z∈[-131,-93]mm · 2 of 208 slices shown]
[im 1/208]
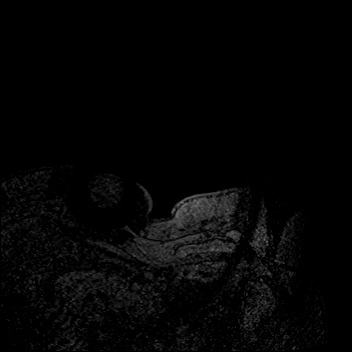
[im 30/208]
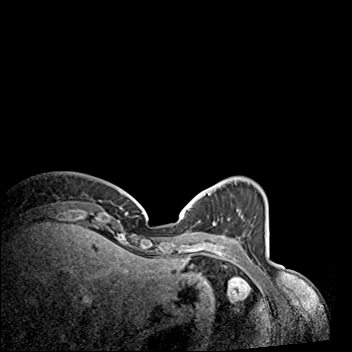

[34 of 48 positions shown; findings below may reference images not displayed]

FINDINGS: I met with the patient, and we discussed the procedure of MRI guided
biopsy, including risks, benefits, and alternatives. Specifically,
we discussed the risks of infection, bleeding, tissue injury, clip
migration, and inadequate sampling. Informed, written consent was
given. The usual time out protocol was performed immediately prior
to the procedure.

Using sterile technique, 1% Lidocaine, MRI guidance, and a 9 gauge
vacuum assisted device, biopsy was performed of a mass in the
retroareolar position using a lateral approach. At the conclusion of
the procedure, a barbell shaped tissue marker clip was deployed into
the biopsy cavity.

Using sterile technique, 1% Lidocaine, MRI guidance, and a 9 gauge
vacuum assisted device, biopsy was performed of non mass enhancement
in the lower outer quadrant using a lateral approach. At the
conclusion of the procedure, a cylinder shaped tissue marker clip
was deployed into the biopsy cavity.

Follow-up 2-view mammogram was performed and dictated separately.
IMPRESSION: MRI guided biopsy of the left breast x2.  No apparent complications.

ADDENDUM:
Pathology revealed BENIGN BREAST TISSUE WITH A SMALL RADIAL SCAR AND
BACKGROUND ADENOSIS of the RIGHT breast, upper outer quadrant,
(barbell clip). This was found to be concordant by Dr. FRANZ
FRANZ, with excision recommended.

Pathology revealed INVASIVE DUCTAL CARCINOMA, CONSISTENT WITH THE
PATIENT'S KNOWN GRADE 3 INVASIVE DUCTAL CARCINOMA of the RIGHT
breast, upper inner quadrant, (cylinder clip). This was found to be
concordant by Dr. FRANZ.

Pathology revealed SCLEROSED PAPILLOMA WITH USUAL DUCT HYPERPLASIA,
ADJACENT FIBROCYSTIC CHANGES INCLUDING STROMAL FIBROSIS AND
ADENOSIS, MICROCALCIFICATIONS PRESENT WITHIN THE PAPILLOMA AND
ADENOSIS of the LEFT breast, subareolar (barbell clip). This was
found to be concordant by Dr. FRANZ, with excision
recommended.

Pathology revealed BENIGN FIBROMATOID NODULE, PROLIFERATIVE
FIBROCYSTIC CHANGES INCLUDING CYSTIC DILATATION OF DUCTS, APOCRINE
METAPLASIA, STROMAL FIBROSIS, ADENOSIS, SCLEROSING ADENOSIS AND
USUAL DUCT HYPERPLASIA, RARE MICROCALCIFICATIONS WITHIN ADENOSIS of
the LEFT breast, lower outer quadrant, (cylinder clip). This was
found to be concordant by Dr. FRANZ.

Pathology results were discussed with the patient by telephone. The
patient reported doing well after the biopsies with tenderness at
all sites, and more swelling on the LEFT. Post biopsy instructions
and care were reviewed and questions were answered. The patient was
encouraged to call The [REDACTED] for any
additional concerns. My direct phone number was provided.

The patient has a recent diagnosis of RIGHT breast cancer and should
follow her outlined treatment plan.

Dr. FRANZ and Dr. FRANZ were notified of biopsy
results via [REDACTED] message on [DATE].

Pathology results reported by FRANZ, RN on [DATE].

*** End of Addendum ***
FINDINGS: I met with the patient, and we discussed the procedure of MRI guided
biopsy, including risks, benefits, and alternatives. Specifically,
we discussed the risks of infection, bleeding, tissue injury, clip
migration, and inadequate sampling. Informed, written consent was
given. The usual time out protocol was performed immediately prior
to the procedure.

Using sterile technique, 1% Lidocaine, MRI guidance, and a 9 gauge
vacuum assisted device, biopsy was performed of a mass in the
retroareolar position using a lateral approach. At the conclusion of
the procedure, a barbell shaped tissue marker clip was deployed into
the biopsy cavity.

Using sterile technique, 1% Lidocaine, MRI guidance, and a 9 gauge
vacuum assisted device, biopsy was performed of non mass enhancement
in the lower outer quadrant using a lateral approach. At the
conclusion of the procedure, a cylinder shaped tissue marker clip
was deployed into the biopsy cavity.

Follow-up 2-view mammogram was performed and dictated separately.
IMPRESSION: MRI guided biopsy of the left breast x2.  No apparent complications.

## 2021-06-22 MED ORDER — GADOBUTROL 1 MMOL/ML IV SOLN
10.0000 mL | Freq: Once | INTRAVENOUS | Status: AC | PRN
Start: 2021-06-22 — End: 2021-06-22
  Administered 2021-06-22: 10 mL via INTRAVENOUS

## 2021-06-22 NOTE — Telephone Encounter (Signed)
[id number] - TREATMENT OF REFRACTORY NAUSEA   06/22/2021  4 DAY PHONE CALL: Confirmed I was speaking with Patricia Houston . Informed patient reason for call is to follow-up on nausea for the above mentioned study. Call was made day 5 due to day 4 being a non-working day. Patients states she has done well overall as far as nausea/vomiting and did not report a 3 or greater on 4- day home record. Encouraged patient to complete the 4 day home record and return with pre-paid postage envelope and complete questionnaires either via email or paper. Thanked patient for her time and continued support in the above mentioned study.   Carol Ada, RT(R)(T) Clinical Research Coordinator

## 2021-06-23 ENCOUNTER — Encounter: Payer: Self-pay | Admitting: *Deleted

## 2021-06-24 ENCOUNTER — Other Ambulatory Visit: Payer: Self-pay | Admitting: *Deleted

## 2021-06-24 ENCOUNTER — Inpatient Hospital Stay: Payer: Federal, State, Local not specified - PPO

## 2021-06-24 ENCOUNTER — Telehealth: Payer: Self-pay | Admitting: *Deleted

## 2021-06-24 ENCOUNTER — Inpatient Hospital Stay (HOSPITAL_BASED_OUTPATIENT_CLINIC_OR_DEPARTMENT_OTHER): Payer: Federal, State, Local not specified - PPO | Admitting: Hematology and Oncology

## 2021-06-24 DIAGNOSIS — Z79899 Other long term (current) drug therapy: Secondary | ICD-10-CM | POA: Diagnosis not present

## 2021-06-24 DIAGNOSIS — R197 Diarrhea, unspecified: Secondary | ICD-10-CM | POA: Diagnosis not present

## 2021-06-24 DIAGNOSIS — C50411 Malignant neoplasm of upper-outer quadrant of right female breast: Secondary | ICD-10-CM

## 2021-06-24 DIAGNOSIS — R102 Pelvic and perineal pain: Secondary | ICD-10-CM | POA: Diagnosis not present

## 2021-06-24 DIAGNOSIS — Z95828 Presence of other vascular implants and grafts: Secondary | ICD-10-CM

## 2021-06-24 DIAGNOSIS — R5383 Other fatigue: Secondary | ICD-10-CM | POA: Diagnosis not present

## 2021-06-24 DIAGNOSIS — M898X9 Other specified disorders of bone, unspecified site: Secondary | ICD-10-CM | POA: Diagnosis not present

## 2021-06-24 DIAGNOSIS — Z7952 Long term (current) use of systemic steroids: Secondary | ICD-10-CM | POA: Diagnosis not present

## 2021-06-24 DIAGNOSIS — Z171 Estrogen receptor negative status [ER-]: Secondary | ICD-10-CM | POA: Diagnosis not present

## 2021-06-24 LAB — CBC WITH DIFFERENTIAL (CANCER CENTER ONLY)
Abs Immature Granulocytes: 0.59 K/uL — ABNORMAL HIGH (ref 0.00–0.07)
Basophils Absolute: 0 K/uL (ref 0.0–0.1)
Basophils Relative: 1 %
Eosinophils Absolute: 0 K/uL (ref 0.0–0.5)
Eosinophils Relative: 1 %
HCT: 37.5 % (ref 36.0–46.0)
Hemoglobin: 12.6 g/dL (ref 12.0–15.0)
Immature Granulocytes: 9 %
Lymphocytes Relative: 23 %
Lymphs Abs: 1.5 K/uL (ref 0.7–4.0)
MCH: 28.2 pg (ref 26.0–34.0)
MCHC: 33.6 g/dL (ref 30.0–36.0)
MCV: 83.9 fL (ref 80.0–100.0)
Monocytes Absolute: 0.8 K/uL (ref 0.1–1.0)
Monocytes Relative: 13 %
Neutro Abs: 3.4 K/uL (ref 1.7–7.7)
Neutrophils Relative %: 53 %
Platelet Count: 159 K/uL (ref 150–400)
RBC: 4.47 MIL/uL (ref 3.87–5.11)
RDW: 12.8 % (ref 11.5–15.5)
Smear Review: NORMAL
WBC Count: 6.4 K/uL (ref 4.0–10.5)
nRBC: 0 % (ref 0.0–0.2)

## 2021-06-24 LAB — URINALYSIS, COMPLETE (UACMP) WITH MICROSCOPIC
Bilirubin Urine: NEGATIVE
Glucose, UA: >=500 mg/dL — AB
Hgb urine dipstick: NEGATIVE
Ketones, ur: NEGATIVE mg/dL
Leukocytes,Ua: NEGATIVE
Nitrite: NEGATIVE
Protein, ur: NEGATIVE mg/dL
Specific Gravity, Urine: 1.011 (ref 1.005–1.030)
pH: 5 (ref 5.0–8.0)

## 2021-06-24 LAB — CMP (CANCER CENTER ONLY)
ALT: 33 U/L (ref 0–44)
AST: 18 U/L (ref 15–41)
Albumin: 4.3 g/dL (ref 3.5–5.0)
Alkaline Phosphatase: 92 U/L (ref 38–126)
Anion gap: 4 — ABNORMAL LOW (ref 5–15)
BUN: 12 mg/dL (ref 6–20)
CO2: 27 mmol/L (ref 22–32)
Calcium: 10 mg/dL (ref 8.9–10.3)
Chloride: 103 mmol/L (ref 98–111)
Creatinine: 0.85 mg/dL (ref 0.44–1.00)
GFR, Estimated: >60 mL/min (ref >60)
Glucose, Bld: 105 mg/dL — ABNORMAL HIGH (ref 70–99)
Potassium: 4.3 mmol/L (ref 3.5–5.1)
Sodium: 134 mmol/L — ABNORMAL LOW (ref 135–145)
Total Bilirubin: 0.4 mg/dL (ref 0.3–1.2)
Total Protein: 7.7 g/dL (ref 6.5–8.1)

## 2021-06-24 MED ORDER — SODIUM CHLORIDE 0.9% FLUSH: 10.0000 mL | Freq: Once | INTRAVENOUS | Status: DC

## 2021-06-24 MED ORDER — HEPARIN SOD (PORK) LOCK FLUSH 100 UNIT/ML IV SOLN: 500.0000 [IU] | Freq: Once | INTRAVENOUS | Status: DC

## 2021-06-24 NOTE — Progress Notes (Signed)
Pt's incision to port was slightly open and bleeding, per Heinz Knuckles RN, don't access port today and send pt to lab for peripheral draw and to see MD after. Pt verbalizes understanding.

## 2021-06-24 NOTE — Assessment & Plan Note (Addendum)
receptor negative ([place]) 06/14/2018: Right lumpectomy: Intraductal papilloma with usual ductal hyperplasia, 1 benign lymph node (decided against tamoxifen) 05/19/2021:Screening mammogram detected right breast density. 2 lymph nodes were noted biopsy appointment was benign. Ultrasound revealed 2.2 cm mass at 11 o'clock position right breast biopsy: Grade 3 IDC with necrosis, ER 0%, PR 0%, Ki-67 60%, HER2 3+ positive by IHC  Treatment Plan: 1. Neoadjuvant chemotherapy with TCH Perjeta 6 cycles followed by Herceptin Perjeta maintenance versus Kadcyla maintenance (based on response to neoadjuvant chemo) for 1 year 2. Followed by breast conserving surgery with sentinel lymph node study 3. Followed by adjuvant radiation therapy  MRI Breast 06/08/21:  Rt Breast cancer 2.8 cm, additional NME total 6.5 cm, Left breast 0.6 cm retroareolar mass, additional indeterminate NME 3.5 cm (Need MRI guided biopsies Rt Br and Left Breast) CT CAP 06/10/21: Breast masses and Bil Axillary LN, 4 mm Rt LL nodule, tiny hepatic densities 4 mm too small, nodular area inferior bladder (wall thickening vs ext effacement) Bone scan 06/10/21: Benign --------------------------------------------------------------------------------------------------------------- Current Treatment: Cycle 1 day 8 TCHP ECHO EF 60-65%  Chemo Toxicities: 1.  Severe pelvic bone pain: I encouraged her to take Motrin to relieve the pain. 2. diarrhea: 3-4 loose stools per day: She has not taken any Imodium.  She will start taking Imodium as needed 3.  Fatigue  She denied any nausea or vomiting. Labs do not show any evidence of neutropenia therefore we will keep the dosage the same.  RTC in 2 weeks for cycle 2

## 2021-06-24 NOTE — Progress Notes (Signed)
Received after hours message from pt with complaint of severe lower back pain and diarrhea x1 day.  Pt denies recent injury, trauma, or fever at this time. Pt states pain is very minimal now and is scheduled to f/u with MD today.  Per MD pt needing UA and Culture added to today's lab work to assess for UTI. Orders placed.

## 2021-06-24 NOTE — Telephone Encounter (Signed)
Per MD request RN placed call to pt regarding UA being negative for UTI at this time.  Pt educated and verbalized understanding.

## 2021-06-25 LAB — URINE CULTURE: Culture: <10000 — AB

## 2021-07-06 DIAGNOSIS — Z0000 Encounter for general adult medical examination without abnormal findings: Secondary | ICD-10-CM | POA: Diagnosis not present

## 2021-07-08 MED FILL — Fosaprepitant Dimeglumine For IV Infusion 150 MG (Base Eq): INTRAVENOUS | Qty: 5 | Status: AC

## 2021-07-08 MED FILL — Dexamethasone Sodium Phosphate Inj 100 MG/10ML: INTRAMUSCULAR | Qty: 1 | Status: AC

## 2021-07-09 ENCOUNTER — Encounter: Payer: Federal, State, Local not specified - PPO | Admitting: Genetic Counselor

## 2021-07-09 ENCOUNTER — Other Ambulatory Visit: Payer: Self-pay

## 2021-07-09 ENCOUNTER — Inpatient Hospital Stay (HOSPITAL_BASED_OUTPATIENT_CLINIC_OR_DEPARTMENT_OTHER): Payer: Federal, State, Local not specified - PPO | Admitting: Adult Health

## 2021-07-09 ENCOUNTER — Inpatient Hospital Stay: Payer: Federal, State, Local not specified - PPO | Attending: Hematology and Oncology

## 2021-07-09 ENCOUNTER — Encounter: Payer: Self-pay | Admitting: Adult Health

## 2021-07-09 ENCOUNTER — Inpatient Hospital Stay: Payer: Federal, State, Local not specified - PPO

## 2021-07-09 ENCOUNTER — Encounter: Payer: Self-pay | Admitting: Radiology

## 2021-07-09 VITALS — BP 131/91 | HR 85 | Temp 97.5°F | Resp 16 | Ht 66.0 in | Wt 179.7 lb

## 2021-07-09 DIAGNOSIS — E559 Vitamin D deficiency, unspecified: Secondary | ICD-10-CM | POA: Insufficient documentation

## 2021-07-09 DIAGNOSIS — Z803 Family history of malignant neoplasm of breast: Secondary | ICD-10-CM | POA: Insufficient documentation

## 2021-07-09 DIAGNOSIS — R197 Diarrhea, unspecified: Secondary | ICD-10-CM | POA: Insufficient documentation

## 2021-07-09 DIAGNOSIS — Z800 Family history of malignant neoplasm of digestive organs: Secondary | ICD-10-CM | POA: Diagnosis not present

## 2021-07-09 DIAGNOSIS — E785 Hyperlipidemia, unspecified: Secondary | ICD-10-CM | POA: Insufficient documentation

## 2021-07-09 DIAGNOSIS — D509 Iron deficiency anemia, unspecified: Secondary | ICD-10-CM | POA: Diagnosis not present

## 2021-07-09 DIAGNOSIS — D6481 Anemia due to antineoplastic chemotherapy: Secondary | ICD-10-CM | POA: Insufficient documentation

## 2021-07-09 DIAGNOSIS — R5383 Other fatigue: Secondary | ICD-10-CM | POA: Insufficient documentation

## 2021-07-09 DIAGNOSIS — T451X5A Adverse effect of antineoplastic and immunosuppressive drugs, initial encounter: Secondary | ICD-10-CM | POA: Insufficient documentation

## 2021-07-09 DIAGNOSIS — Z79899 Other long term (current) drug therapy: Secondary | ICD-10-CM | POA: Insufficient documentation

## 2021-07-09 DIAGNOSIS — C50411 Malignant neoplasm of upper-outer quadrant of right female breast: Secondary | ICD-10-CM

## 2021-07-09 DIAGNOSIS — Z8249 Family history of ischemic heart disease and other diseases of the circulatory system: Secondary | ICD-10-CM | POA: Insufficient documentation

## 2021-07-09 DIAGNOSIS — Z171 Estrogen receptor negative status [ER-]: Secondary | ICD-10-CM | POA: Insufficient documentation

## 2021-07-09 DIAGNOSIS — Z806 Family history of leukemia: Secondary | ICD-10-CM | POA: Diagnosis not present

## 2021-07-09 DIAGNOSIS — Z5112 Encounter for antineoplastic immunotherapy: Secondary | ICD-10-CM | POA: Diagnosis not present

## 2021-07-09 DIAGNOSIS — K5900 Constipation, unspecified: Secondary | ICD-10-CM | POA: Diagnosis not present

## 2021-07-09 DIAGNOSIS — Z5189 Encounter for other specified aftercare: Secondary | ICD-10-CM | POA: Insufficient documentation

## 2021-07-09 DIAGNOSIS — Z5111 Encounter for antineoplastic chemotherapy: Secondary | ICD-10-CM | POA: Insufficient documentation

## 2021-07-09 DIAGNOSIS — Z95828 Presence of other vascular implants and grafts: Secondary | ICD-10-CM

## 2021-07-09 LAB — COMPREHENSIVE METABOLIC PANEL
ALT: 29 U/L (ref 0–44)
AST: 18 U/L (ref 15–41)
Albumin: 4.3 g/dL (ref 3.5–5.0)
Alkaline Phosphatase: 97 U/L (ref 38–126)
Anion gap: 6 (ref 5–15)
BUN: 17 mg/dL (ref 6–20)
CO2: 27 mmol/L (ref 22–32)
Calcium: 10 mg/dL (ref 8.9–10.3)
Chloride: 104 mmol/L (ref 98–111)
Creatinine, Ser: 0.88 mg/dL (ref 0.44–1.00)
GFR, Estimated: >60 mL/min (ref >60)
Glucose, Bld: 110 mg/dL — ABNORMAL HIGH (ref 70–99)
Potassium: 4 mmol/L (ref 3.5–5.1)
Sodium: 137 mmol/L (ref 135–145)
Total Bilirubin: 0.5 mg/dL (ref 0.3–1.2)
Total Protein: 7.4 g/dL (ref 6.5–8.1)

## 2021-07-09 LAB — CBC WITH DIFFERENTIAL/PLATELET
Abs Immature Granulocytes: 0.05 10*3/uL (ref 0.00–0.07)
Basophils Absolute: 0 10*3/uL (ref 0.0–0.1)
Basophils Relative: 0 %
Eosinophils Absolute: 0 10*3/uL (ref 0.0–0.5)
Eosinophils Relative: 0 %
HCT: 32.6 % — ABNORMAL LOW (ref 36.0–46.0)
Hemoglobin: 10.9 g/dL — ABNORMAL LOW (ref 12.0–15.0)
Immature Granulocytes: 1 %
Lymphocytes Relative: 15 %
Lymphs Abs: 1.5 10*3/uL (ref 0.7–4.0)
MCH: 28.6 pg (ref 26.0–34.0)
MCHC: 33.4 g/dL (ref 30.0–36.0)
MCV: 85.6 fL (ref 80.0–100.0)
Monocytes Absolute: 0.3 10*3/uL (ref 0.1–1.0)
Monocytes Relative: 3 %
Neutro Abs: 8.1 10*3/uL — ABNORMAL HIGH (ref 1.7–7.7)
Neutrophils Relative %: 81 %
Platelets: 328 10*3/uL (ref 150–400)
RBC: 3.81 MIL/uL — ABNORMAL LOW (ref 3.87–5.11)
RDW: 13.8 % (ref 11.5–15.5)
WBC: 9.9 10*3/uL (ref 4.0–10.5)
nRBC: 0 % (ref 0.0–0.2)

## 2021-07-09 MED ORDER — SODIUM CHLORIDE 0.9 % IV SOLN: Freq: Once | INTRAVENOUS | Status: AC

## 2021-07-09 MED ORDER — ACETAMINOPHEN 325 MG PO TABS
650.0000 mg | ORAL_TABLET | Freq: Once | ORAL | Status: AC
  Administered 2021-07-09: 650 mg via ORAL
  Filled 2021-07-09: qty 2

## 2021-07-09 MED ORDER — TRASTUZUMAB-DKST CHEMO 150 MG IV SOLR
6.0000 mg/kg | Freq: Once | INTRAVENOUS | Status: AC
  Administered 2021-07-09: 504 mg via INTRAVENOUS
  Filled 2021-07-09: qty 24

## 2021-07-09 MED ORDER — PALONOSETRON HCL INJECTION 0.25 MG/5ML
0.2500 mg | Freq: Once | INTRAVENOUS | Status: AC
  Administered 2021-07-09: 0.25 mg via INTRAVENOUS
  Filled 2021-07-09: qty 5

## 2021-07-09 MED ORDER — SODIUM CHLORIDE 0.9% FLUSH
10.0000 mL | Freq: Once | INTRAVENOUS | Status: AC
  Administered 2021-07-09: 10 mL

## 2021-07-09 MED ORDER — SODIUM CHLORIDE 0.9 % IV SOLN
10.0000 mg | Freq: Once | INTRAVENOUS | Status: AC
  Administered 2021-07-09: 10 mg via INTRAVENOUS
  Filled 2021-07-09: qty 10

## 2021-07-09 MED ORDER — SODIUM CHLORIDE 0.9 % IV SOLN
600.0000 mg | Freq: Once | INTRAVENOUS | Status: AC
  Administered 2021-07-09: 600 mg via INTRAVENOUS
  Filled 2021-07-09: qty 60

## 2021-07-09 MED ORDER — SODIUM CHLORIDE 0.9 % IV SOLN
420.0000 mg | Freq: Once | INTRAVENOUS | Status: AC
  Administered 2021-07-09: 420 mg via INTRAVENOUS
  Filled 2021-07-09: qty 14

## 2021-07-09 MED ORDER — HEPARIN SOD (PORK) LOCK FLUSH 100 UNIT/ML IV SOLN
500.0000 [IU] | Freq: Once | INTRAVENOUS | Status: AC | PRN
  Administered 2021-07-09: 500 [IU]

## 2021-07-09 MED ORDER — SODIUM CHLORIDE 0.9 % IV SOLN
150.0000 mg | Freq: Once | INTRAVENOUS | Status: AC
  Administered 2021-07-09: 150 mg via INTRAVENOUS
  Filled 2021-07-09: qty 150

## 2021-07-09 MED ORDER — SODIUM CHLORIDE 0.9 % IV SOLN
75.0000 mg/m2 | Freq: Once | INTRAVENOUS | Status: AC
  Administered 2021-07-09: 150 mg via INTRAVENOUS
  Filled 2021-07-09: qty 15

## 2021-07-09 MED ORDER — DIPHENHYDRAMINE HCL 25 MG PO CAPS
25.0000 mg | ORAL_CAPSULE | Freq: Once | ORAL | Status: AC
  Administered 2021-07-09: 25 mg via ORAL
  Filled 2021-07-09: qty 1

## 2021-07-09 MED ORDER — SODIUM CHLORIDE 0.9% FLUSH
10.0000 mL | INTRAVENOUS | Status: DC | PRN
  Administered 2021-07-09: 10 mL

## 2021-07-09 NOTE — Patient Instructions (Signed)
[facility]  Discharge Instructions: Thank you for choosing [facility] to provide your oncology and hematology care.   If you have a lab appointment with the [facility], please go directly to the [facility] and check in at the registration area.   Wear comfortable clothing and clothing appropriate for easy access to any Portacath or PICC line.   We strive to give you quality time with your provider. You may need to reschedule your appointment if you arrive late (15 or more minutes).  Arriving late affects you and other patients whose appointments are after yours.  Also, if you miss three or more appointments without notifying the office, you may be dismissed from the clinic at the provider's discretion.      For prescription refill requests, have your pharmacy contact our office and allow 72 hours for refills to be completed.    Today you received the following chemotherapy and/or immunotherapy agents taxotere, carboplatin, herceptin, perjeta   To help prevent nausea and vomiting after your treatment, we encourage you to take your nausea medication as directed.  BELOW ARE SYMPTOMS THAT SHOULD BE REPORTED IMMEDIATELY: *FEVER GREATER THAN 100.4 F (38 C) OR HIGHER *CHILLS OR SWEATING *NAUSEA AND VOMITING THAT IS NOT CONTROLLED WITH YOUR NAUSEA MEDICATION *UNUSUAL SHORTNESS OF BREATH *UNUSUAL BRUISING OR BLEEDING *URINARY PROBLEMS (pain or burning when urinating, or frequent urination) *BOWEL PROBLEMS (unusual diarrhea, constipation, pain near the anus) TENDERNESS IN MOUTH AND THROAT WITH OR WITHOUT PRESENCE OF ULCERS (sore throat, sores in mouth, or a toothache) UNUSUAL RASH, SWELLING OR PAIN  UNUSUAL VAGINAL DISCHARGE OR ITCHING   Items with * indicate a potential emergency and should be followed up as soon as possible or go to the Emergency Department if any problems should occur.  Please show the CHEMOTHERAPY ALERT CARD or  IMMUNOTHERAPY ALERT CARD at check-in to the Emergency Department and triage nurse.  Should you have questions after your visit or need to cancel or reschedule your appointment, please contact [facility]  Dept: [phone]  and follow the prompts.  Office hours are 8:00 a.m. to 4:30 p.m. Monday - Friday. Please note that voicemails left after 4:00 p.m. may not be returned until the following business day.  We are closed weekends and major holidays. You have access to a nurse at all times for urgent questions. Please call the main number to the clinic Dept: [phone] and follow the prompts.   For any non-urgent questions, you may also contact your provider using MyChart. We now offer e-Visits for anyone 41 and older to request care online for non-urgent symptoms. For details visit [email or web address].   Also download the MyChart app! Go to the app store, search "MyChart", open the app, select [facility], and log in with your MyChart username and password.  Masks are optional in the cancer centers. If you would like for your care team to wear a mask while they are taking care of you, please let them know. For doctor visits, patients may have with them one support person who is at least 54 years old. At this time, visitors are not allowed in the infusion area.

## 2021-07-09 NOTE — Research (Signed)
[id number] - TREATMENT OF REFRACTORY NAUSEA   07/09/21  VISIT: Met with patient unaccompanied prior to her provider visit. Completed 4 day home record with patient. Thanked patient for her time and support of the above mentioned study.   Carol Ada, RT(R)(T) Clinical Research Coordinator

## 2021-07-09 NOTE — Assessment & Plan Note (Addendum)
06/14/2018: Right lumpectomy: Intraductal papilloma with usual ductal hyperplasia, 1 benign lymph node (decided against tamoxifen) 05/19/2021:Screening mammogram detected right breast density. 2 lymph nodes were noted biopsy appointment was benign. Ultrasound revealed 2.2 cm mass at 11 o'clock position right breast biopsy: Grade 3 IDC with necrosis, ER 0%, PR 0%, Ki-67 60%, HER2 3+ positive by IHC  Treatment Plan: 1. Neoadjuvant chemotherapy with TCH Perjeta 6 cycles followed by Herceptin Perjeta maintenance versus Kadcyla maintenance (based on response to neoadjuvant chemo) for 1 year 2. Followed by breast conserving surgery with sentinel lymph node study 3. Followed by adjuvant radiation therapy  MRI Breast 06/08/21:  Rt Breast cancer 2.8 cm, additional NME total 6.5 cm, Left breast 0.6 cm retroareolar mass, additional indeterminate NME 3.5 cm (Need MRI guided biopsies Rt Br and Left Breast) CT CAP 06/10/21: Breast masses and Bil Axillary LN, 4 mm Rt LL nodule, tiny hepatic densities 4 mm too small, nodular area inferior bladder (wall thickening vs ext effacement) Bone scan 06/10/21: Benign --------------------------------------------------------------------------------------------------------------- Current Treatment: Cycle 2 day 1 TCHP ECHO EF 60-65% 06/04/2021  Dub Mikes is doing well today.  Her diarrhea resolved with Imodium.  For the dryness of her hands I did give her some lotion today.  I reviewed her labs with her in detail which are normal.  We discussed her chemotherapy, the need to repeat echocardiograms every 3 months, and the role of Herceptin versus TDM 1 following surgery.  Dub Mikes notes a remote history in the 1990s of herpes however has not had any recent issues with this.  I let her know to call us as soon as she notices any issues so we can send in Valtrex.  At this point since it is a remote I will not send in prophylaxis.  We will see Dub Mikes back in 3 weeks for labs, follow-up,  and cycle 3 of chemotherapy.

## 2021-07-09 NOTE — Research (Signed)
DCP-001: Use of a Clinical Trial Screening Tool to Address Cancer Health Disparities in the [place] [facility] ([facility])   07/09/21  CONSENT: Patient Patricia Houston was identified by Dr. Lindi Adie as a potential candidate for the above listed study.  This Clinical Research Coordinator met with Patricia Houston, Patricia Houston, on 07/09/21 in a manner and location that ensures patient privacy to discuss participation in the above listed research study.  Patient is Unaccompanied.  A copy of the informed consent document and separate HIPAA Authorization was provided to the patient.  Patient reads, speaks, and understands [place].    Patient was provided with the business card of this Coordinator and encouraged to contact the research team with any questions.  Patient was provided the option of taking informed consent documents home to review and was encouraged to review at their convenience with their support network, including other care providers. Patient is comfortable with making a decision regarding study participation today.  As outlined in the informed consent form, this Coordinator and Patricia Houston discussed the purpose of the research study, the investigational nature of the study, study procedures and requirements for study participation, potential risks and benefits of study participation, as well as alternatives to participation.   Confidentiality and how the patient's information will be used as part of study participation were discussed.  Patient was informed there is not reimbursement provided for their time and effort spent on trial participation.  The patient is encouraged to discuss research study participation with their insurance provider to determine what costs they may incur as part of study participation, including research related injury.    All questions were answered to patient's satisfaction.  The informed consent and separate HIPAA Authorization was  reviewed page by page.  The patient's mental and emotional status is appropriate to provide informed consent, and the patient verbalizes an understanding of study participation.  Patient has agreed to participate in the above listed research study and has voluntarily signed the informed consent dated 02/02/2021 with embedded HIPAA language, version 15 dated 10/14/2020  on 07/09/21 at 924AM.  The patient was provided with a copy of the signed informed consent form and separate HIPAA Authorization for their reference.  No study specific procedures were obtained prior to the signing of the informed consent document.  Approximately 15 minutes were spent with the patient reviewing the informed consent documents.  Patient was not requested to complete a Release of Information form.  Carol Ada, RT(R)(T) Clinical Research Coordinator

## 2021-07-09 NOTE — Progress Notes (Signed)
[facility] Follow up:    Lois Huxley, [place] [address]   DIAGNOSIS:  Cancer Staging  Malignant neoplasm of upper-outer quadrant of right breast in female, estrogen receptor negative ([place]) Staging form: Breast, AJCC 8th Edition - Clinical: Stage IIA (cT2, cN0, cM0, G3, ER-, PR-, HER2+) - Signed by Nicholas Lose, MD on 05/27/2021 Stage prefix: Initial diagnosis Histologic grading system: 3 grade system   SUMMARY OF ONCOLOGIC HISTORY: Oncology History  Malignant neoplasm of upper-outer quadrant of right breast in female, estrogen receptor negative ([place])  06/14/2018 Surgery   06/14/2018: Right lumpectomy: Intraductal papilloma with usual ductal hyperplasia, 1 benign lymph node (decided against tamoxifen)   05/19/2021 Relapse/Recurrence   Screening mammogram detected right breast density.  2 lymph nodes were noted biopsy appointment was benign.  Ultrasound revealed 2.2 cm mass at 11 o'clock position right breast biopsy: Grade 3 IDC with necrosis, ER 0%, PR 0%, Ki-67 60%, HER2 3+ positive by [facility]   05/27/2021 Cancer Staging   Staging form: Breast, AJCC 8th Edition - Clinical: Stage IIA (cT2, cN0, cM0, G3, ER-, PR-, HER2+) - Signed by Nicholas Lose, MD on 05/27/2021 Stage prefix: Initial diagnosis Histologic grading system: 3 grade system   06/18/2021 -  Chemotherapy   Patient is on Treatment Plan : BREAST  Docetaxel + Carboplatin + Trastuzumab + Pertuzumab  (TCHP) q21d        CURRENT THERAPY: cycle 2 TCHP  INTERVAL HISTORY: EVER HALBERG 56 y.o. female returns for follow up prior to receiving her second cycle of neoadjuvant chemotherapy.  She notes fatigue, diarrhea managed with imodium.  She hasn't noted any peripheral neuropathy in her fingertips or toes at this point.  Otherwise she is doing well today.    Patient Active Problem List   Diagnosis Date Noted   Vitamin D deficiency 07/09/2021   Iron deficiency anemia 07/09/2021    Hyperlipidemia 07/09/2021   Port-A-Cath in place 06/18/2021   Malignant neoplasm of upper-outer quadrant of right breast in female, estrogen receptor negative ([place]) 05/27/2021   Atypical ductal hyperplasia of right breast 07/18/2018   Genetic testing 03/31/2018   Family history of breast cancer in mother 04/03/2015   Family history of pancreatic cancer 04/03/2015   S/P hysterectomy 02/27/2014   ANEMIA 07/01/2009    has No Known Allergies.  MEDICAL HISTORY: Past Medical History:  Diagnosis Date   Anemia    TAKES IRON PILLS   Anxiety    Cancer ([place])    right breast DCIS   Depression     SURGICAL HISTORY: Past Surgical History:  Procedure Laterality Date   ABDOMINAL HYSTERECTOMY     BREAST LUMPECTOMY WITH RADIOACTIVE SEED LOCALIZATION Right 06/14/2018   Procedure: RIGHT BREAST RADIOACTIVE SEED LOCALIZATION LUMPECTOMY X2;  Surgeon: Erroll Luna, MD;  Location: [facility];  Service: General;  Laterality: Right;   IR IMAGING GUIDED PORT INSERTION  06/17/2021   LAPAROSCOPIC ASSISTED VAGINAL HYSTERECTOMY N/A 02/27/2014   Procedure: LAPAROSCOPIC ASSISTED VAGINAL HYSTERECTOMY;  Surgeon: Maeola Sarah. Landry Mellow, MD;  Location: [facility];  Service: Gynecology;  Laterality: N/A;  abdomen to vagina to abdomen   TONSILLECTOMY      SOCIAL HISTORY: Social History   Socioeconomic History   Marital status: Married    Spouse name: Not on file   Number of children: Not on file   Years of education: Not on file   Highest education level: Not on file  Occupational History  Not on file  Tobacco Use   Smoking status: Never   Smokeless tobacco: Never  Vaping Use   Vaping Use: Never used  Substance and Sexual Activity   Alcohol use: Yes    Comment: rare - 1-2 drinks every few months   Drug use: No   Sexual activity: Yes    Birth control/protection: Surgical  Other Topics Concern   Not on file  Social History Narrative   Not on file   Social Determinants of Health    Financial Resource Strain: Not on file  Food Insecurity: Not on file  Transportation Needs: No Transportation Needs (06/20/2018)   PRAPARE - Hydrologist (Medical): No    Lack of Transportation (Non-Medical): No  Physical Activity: Not on file  Stress: Not on file  Social Connections: Not on file  Intimate Partner Violence: Not At Risk (06/20/2018)   Humiliation, Afraid, Rape, and Kick questionnaire    Fear of Current or Ex-Partner: No    Emotionally Abused: No    Physically Abused: No    Sexually Abused: No    FAMILY HISTORY: Family History  Problem Relation Age of Onset   Breast cancer Mother        dx. late 20s; s/p mastectomy   Pancreatic cancer Mother 71       adenocarcinoma   Other Sister        one sister had a hysterectomy due to heavy bleeding at the age of 64   Heart attack Maternal Uncle 41   Heart attack Maternal Grandmother 83   Prostate cancer Maternal Grandfather 86       "very aggressive"   Leukemia Other 68       maternal great aunt (MGM's sister)   Stomach cancer Other        maternal great aunt (MGF's sister) dx. over age 65   Heart Problems Paternal Grandmother    Cancer Other        maternal great uncle (MGM's brother) dx. NOS cancer at later age   Breast cancer Cousin        (2) maternal "2nd cousins" dx. with breast cancers in their mid-50s   Colon cancer Neg Hx     Review of Systems  Constitutional:  Negative for appetite change, chills, fatigue, fever and unexpected weight change.  HENT:   Negative for hearing loss, lump/mass and trouble swallowing.   Eyes:  Negative for eye problems and icterus.  Respiratory:  Negative for chest tightness, cough and shortness of breath.   Cardiovascular:  Negative for chest pain, leg swelling and palpitations.  Gastrointestinal:  Negative for abdominal distention, abdominal pain, constipation, diarrhea, nausea and vomiting.  Endocrine: Negative for hot flashes.  Genitourinary:   Negative for difficulty urinating.   Musculoskeletal:  Negative for arthralgias.  Skin:  Negative for itching and rash.  Neurological:  Negative for dizziness, extremity weakness, headaches and numbness.  Hematological:  Negative for adenopathy. Does not bruise/bleed easily.  Psychiatric/Behavioral:  Negative for depression. The patient is not nervous/anxious.       PHYSICAL EXAMINATION  ECOG PERFORMANCE STATUS: 1 - Symptomatic but completely ambulatory  Vitals:   07/09/21 0817  BP: (!) 131/91  Pulse: 85  Resp: 16  Temp: (!) 97.5 F (36.4 C)  SpO2: 100%    Physical Exam Constitutional:      General: She is not in acute distress.    Appearance: Normal appearance. She is not toxic-appearing.  HENT:  Head: Normocephalic and atraumatic.  Eyes:     General: No scleral icterus. Cardiovascular:     Rate and Rhythm: Normal rate and regular rhythm.     Pulses: Normal pulses.     Heart sounds: Normal heart sounds.  Pulmonary:     Effort: Pulmonary effort is normal.     Breath sounds: Normal breath sounds.  Abdominal:     General: Abdomen is flat. Bowel sounds are normal. There is no distension.     Palpations: Abdomen is soft.     Tenderness: There is no abdominal tenderness.  Musculoskeletal:        General: No swelling.     Cervical back: Neck supple.  Lymphadenopathy:     Cervical: No cervical adenopathy.  Skin:    General: Skin is warm and dry.     Findings: No rash.  Neurological:     General: No focal deficit present.     Mental Status: She is alert.  Psychiatric:        Mood and Affect: Mood normal.        Behavior: Behavior normal.     LABORATORY DATA:  CBC    Component Value Date/Time   WBC 9.9 07/09/2021 0810   RBC 3.81 (L) 07/09/2021 0810   HGB 10.9 (L) 07/09/2021 0810   HGB 12.6 06/24/2021 1157   HCT 32.6 (L) 07/09/2021 0810   PLT 328 07/09/2021 0810   PLT 159 06/24/2021 1157   MCV 85.6 07/09/2021 0810   MCH 28.6 07/09/2021 0810   MCHC  33.4 07/09/2021 0810   RDW 13.8 07/09/2021 0810   LYMPHSABS 1.5 07/09/2021 0810   MONOABS 0.3 07/09/2021 0810   EOSABS 0.0 07/09/2021 0810   BASOSABS 0.0 07/09/2021 0810    CMP     Component Value Date/Time   NA 137 07/09/2021 0810   K 4.0 07/09/2021 0810   CL 104 07/09/2021 0810   CO2 27 07/09/2021 0810   GLUCOSE 110 (H) 07/09/2021 0810   BUN 17 07/09/2021 0810   CREATININE 0.88 07/09/2021 0810   CREATININE 0.85 06/24/2021 1157   CALCIUM 10.0 07/09/2021 0810   PROT 7.4 07/09/2021 0810   ALBUMIN 4.3 07/09/2021 0810   AST 18 07/09/2021 0810   AST 18 06/24/2021 1157   ALT 29 07/09/2021 0810   ALT 33 06/24/2021 1157   ALKPHOS 97 07/09/2021 0810   BILITOT 0.5 07/09/2021 0810   BILITOT 0.4 06/24/2021 1157   GFRNONAA >60 07/09/2021 0810   GFRNONAA >60 06/24/2021 1157   GFRAA >60 06/12/2018 1100   GFRAA >60 03/22/2018 1242     ASSESSMENT and THERAPY PLAN:   Malignant neoplasm of upper-outer quadrant of right breast in female, estrogen receptor negative ([place]) 06/14/2018: Right lumpectomy: Intraductal papilloma with usual ductal hyperplasia, 1 benign lymph node (decided against tamoxifen) 05/19/2021: Screening mammogram detected right breast density.  2 lymph nodes were noted biopsy appointment was benign.  Ultrasound revealed 2.2 cm mass at 11 o'clock position right breast biopsy: Grade 3 IDC with necrosis, ER 0%, PR 0%, Ki-67 60%, HER2 3+ positive by IHC   Treatment Plan: 1. Neoadjuvant chemotherapy with TCH Perjeta 6 cycles followed by Herceptin Perjeta maintenance versus Kadcyla maintenance (based on response to neoadjuvant chemo) for 1 year 2. Followed by breast conserving surgery  with sentinel lymph node study 3. Followed by adjuvant radiation therapy   MRI Breast 06/08/21:  Rt Breast cancer 2.8 cm, additional NME total 6.5 cm, Left breast 0.6 cm retroareolar mass, additional indeterminate  NME 3.5 cm (Need MRI guided biopsies Rt Br and Left Breast) CT CAP 06/10/21: Breast  masses and Bil Axillary LN, 4 mm Rt LL nodule, tiny hepatic densities 4 mm too small, nodular area inferior bladder (wall thickening vs ext effacement) Bone scan 06/10/21: Benign --------------------------------------------------------------------------------------------------------------- Current Treatment: Cycle 2 day 1 TCHP ECHO EF 60-65% 06/04/2021  Dub Mikes is doing well today.  Her diarrhea resolved with Imodium.  For the dryness of her hands I did give her some lotion today.  I reviewed her labs with her in detail which are normal.  We discussed her chemotherapy, the need to repeat echocardiograms every 3 months, and the role of Herceptin versus TDM 1 following surgery.  Dub Mikes notes a remote history in the 1990s of herpes however has not had any recent issues with this.  I let her know to call us as soon as she notices any issues so we can send in Valtrex.  At this point since it is a remote I will not send in prophylaxis.  We will see Dub Mikes back in 3 weeks for labs, follow-up, and cycle 3 of chemotherapy.     All questions were answered. The patient knows to call the clinic with any problems, questions or concerns. We can certainly see the patient much sooner if necessary.  Total encounter time:30 minutes*in face-to-face visit time, chart review, lab review, care coordination, order entry, and documentation of the encounter time.   Wilber Bihari, NP 07/09/21 9:03 AM Medical Oncology and Hematology [facility] [place], [place] [address] Tel. [phone]    Fax. [phone]  *Total Encounter Time as defined by the Centers for Medicare and Medicaid Services includes, in addition to the face-to-face time of a patient visit (documented in the note above) non-face-to-face time: obtaining and reviewing outside history, ordering and reviewing medications, tests or procedures, care coordination (communications with other health care professionals or caregivers)  and documentation in the medical record.

## 2021-07-11 ENCOUNTER — Inpatient Hospital Stay: Payer: Federal, State, Local not specified - PPO

## 2021-07-11 VITALS — BP 117/82 | HR 80 | Temp 98.1°F | Resp 16

## 2021-07-11 DIAGNOSIS — Z171 Estrogen receptor negative status [ER-]: Secondary | ICD-10-CM | POA: Diagnosis not present

## 2021-07-11 DIAGNOSIS — E785 Hyperlipidemia, unspecified: Secondary | ICD-10-CM | POA: Diagnosis not present

## 2021-07-11 DIAGNOSIS — Z5111 Encounter for antineoplastic chemotherapy: Secondary | ICD-10-CM | POA: Diagnosis not present

## 2021-07-11 DIAGNOSIS — C50411 Malignant neoplasm of upper-outer quadrant of right female breast: Secondary | ICD-10-CM

## 2021-07-11 DIAGNOSIS — E559 Vitamin D deficiency, unspecified: Secondary | ICD-10-CM | POA: Diagnosis not present

## 2021-07-11 DIAGNOSIS — Z5112 Encounter for antineoplastic immunotherapy: Secondary | ICD-10-CM | POA: Diagnosis not present

## 2021-07-11 DIAGNOSIS — R5383 Other fatigue: Secondary | ICD-10-CM | POA: Diagnosis not present

## 2021-07-11 DIAGNOSIS — T451X5A Adverse effect of antineoplastic and immunosuppressive drugs, initial encounter: Secondary | ICD-10-CM | POA: Diagnosis not present

## 2021-07-11 DIAGNOSIS — D6481 Anemia due to antineoplastic chemotherapy: Secondary | ICD-10-CM | POA: Diagnosis not present

## 2021-07-11 DIAGNOSIS — Z5189 Encounter for other specified aftercare: Secondary | ICD-10-CM | POA: Diagnosis not present

## 2021-07-11 DIAGNOSIS — K5900 Constipation, unspecified: Secondary | ICD-10-CM | POA: Diagnosis not present

## 2021-07-11 DIAGNOSIS — D509 Iron deficiency anemia, unspecified: Secondary | ICD-10-CM | POA: Diagnosis not present

## 2021-07-11 DIAGNOSIS — R197 Diarrhea, unspecified: Secondary | ICD-10-CM | POA: Diagnosis not present

## 2021-07-11 MED ORDER — PEGFILGRASTIM-CBQV 6 MG/0.6ML ~~LOC~~ SOSY
6.0000 mg | PREFILLED_SYRINGE | Freq: Once | SUBCUTANEOUS | Status: AC
  Administered 2021-07-11: 6 mg via SUBCUTANEOUS

## 2021-07-27 ENCOUNTER — Other Ambulatory Visit: Payer: Self-pay

## 2021-07-29 MED FILL — Dexamethasone Sodium Phosphate Inj 100 MG/10ML: INTRAMUSCULAR | Qty: 1 | Status: AC

## 2021-07-29 MED FILL — Fosaprepitant Dimeglumine For IV Infusion 150 MG (Base Eq): INTRAVENOUS | Qty: 5 | Status: AC

## 2021-07-29 NOTE — Progress Notes (Signed)
Patient Care Team: Lois Huxley, PA as PCP - General (Family Medicine) Mauro Kaufmann, RN as Oncology Nurse Navigator Rockwell Germany, RN as Oncology Nurse Navigator Erroll Luna, MD as Consulting Physician (General Surgery) Nicholas Lose, MD as Consulting Physician (Hematology and Oncology) Eppie Gibson, MD as Attending Physician (Radiation Oncology)  DIAGNOSIS: No diagnosis found.  SUMMARY OF ONCOLOGIC HISTORY: Oncology History  Malignant neoplasm of upper-outer quadrant of right breast in female, estrogen receptor negative ([place])  06/14/2018 Surgery   06/14/2018: Right lumpectomy: Intraductal papilloma with usual ductal hyperplasia, 1 benign lymph node (decided against tamoxifen)   05/19/2021 Relapse/Recurrence   Screening mammogram detected right breast density.  2 lymph nodes were noted biopsy appointment was benign.  Ultrasound revealed 2.2 cm mass at 11 o'clock position right breast biopsy: Grade 3 IDC with necrosis, ER 0%, PR 0%, Ki-67 60%, HER2 3+ positive by [facility]   05/27/2021 Cancer Staging   Staging form: Breast, AJCC 8th Edition - Clinical: Stage IIA (cT2, cN0, cM0, G3, ER-, PR-, HER2+) - Signed by Nicholas Lose, MD on 05/27/2021 Stage prefix: Initial diagnosis Histologic grading system: 3 grade system   06/18/2021 -  Chemotherapy   Patient is on Treatment Plan : BREAST  Docetaxel + Carboplatin + Trastuzumab + Pertuzumab  (TCHP) q21d        CHIEF COMPLIANT:  Follow-up TCHP     INTERVAL HISTORY: Patricia Houston is a 56 y.o. female is here because of recent diagnosis of right breast cancer. She presents to the clinic today for a follow-up.    ALLERGIES:  has No Known Allergies.  MEDICATIONS:  Current Outpatient Medications  Medication Sig Dispense Refill   Cetirizine HCl (ZYRTEC PO) Take by mouth. (Patient not taking: Reported on 07/09/2021)     cholecalciferol (VITAMIN D3) 25 MCG (1000 UNIT) tablet Take 1 tablet (1,000 Units total) by mouth daily.      Cyanocobalamin (VITAMIN B-12) 3000 MCG SUBL Place 1 tablet under the tongue daily.     dexamethasone (DECADRON) 4 MG tablet Take 1 tablet (4 mg total) by mouth daily. Take 1 tablet day before chemo and 1 tablet day after chemo with food 12 tablet 0   Fe Cbn-Fe Gluc-FA-B12-C-DSS (FERRALET 90) 90-1 MG TABS Take 1 tablet by mouth daily.     ferrous sulfate 325 (65 FE) MG tablet 1 tablet (Patient not taking: Reported on 07/09/2021)     ibuprofen (ADVIL) 200 MG tablet Take 200 mg by mouth as needed for moderate pain.     lidocaine-prilocaine (EMLA) cream Apply to affected area once 30 g 3   loratadine (CLARITIN) 10 MG tablet 1 tablet     ondansetron (ZOFRAN) 8 MG tablet Take 1 tablet (8 mg total) by mouth 2 (two) times daily as needed (Nausea or vomiting). Start on the third day after chemotherapy. 30 tablet 1   prochlorperazine (COMPAZINE) 10 MG tablet Take 1 tablet (10 mg total) by mouth every 6 (six) hours as needed (Nausea or vomiting). 30 tablet 1   No current facility-administered medications for this visit.    PHYSICAL EXAMINATION: ECOG PERFORMANCE STATUS: {CHL ONC ECOG PS:[id number]}  There were no vitals filed for this visit. There were no vitals filed for this visit.  BREAST:*** No palpable masses or nodules in either right or left breasts. No palpable axillary supraclavicular or infraclavicular adenopathy no breast tenderness or nipple discharge. (exam performed in the presence of a chaperone)  LABORATORY DATA:  I have reviewed the data as listed  Latest Ref Rng & Units 07/09/2021    8:10 AM 06/24/2021   11:57 AM 06/18/2021    8:17 AM  CMP  Glucose 70 - 99 mg/dL 110  105  144   BUN 6 - 20 mg/dL _0 Creatinine 0.44 - 1.00 mg/dL 0.88  0.85  0.96   Sodium 135 - 145 mmol/L 137  134  138   Potassium 3.5 - 5.1 mmol/L 4.0  4.3  3.7   Chloride 98 - 111 mmol/L 104  103  104   CO2 22 - 32 mmol/L _1 Calcium 8.9 - 10.3 mg/dL 10.0  10.0  10.1   Total Protein 6.5 - 8.1  g/dL 7.4  7.7  7.4   Total Bilirubin 0.3 - 1.2 mg/dL 0.5  0.4  0.4   Alkaline Phos 38 - 126 U/L 97  92  100   AST 15 - 41 U/L _2 ALT 0 - 44 U/L 29  33  19     Lab Results  Component Value Date   WBC 9.9 07/09/2021   HGB 10.9 (L) 07/09/2021   HCT 32.6 (L) 07/09/2021   MCV 85.6 07/09/2021   PLT 328 07/09/2021   NEUTROABS 8.1 (H) 07/09/2021    ASSESSMENT & PLAN:  No problem-specific Assessment & Plan notes found for this encounter.    No orders of the defined types were placed in this encounter.  The patient has a good understanding of the overall plan. she agrees with it. she will call with any problems that may develop before the next visit here. Total time spent: 30 mins including face to face time and time spent for planning, charting and co-ordination of care   Suzzette Righter, [place] 07/29/21    I Gardiner Coins am scribing for Dr. Lindi Adie  ***

## 2021-07-30 ENCOUNTER — Other Ambulatory Visit: Payer: Self-pay

## 2021-07-30 ENCOUNTER — Inpatient Hospital Stay: Payer: Federal, State, Local not specified - PPO

## 2021-07-30 ENCOUNTER — Inpatient Hospital Stay (HOSPITAL_BASED_OUTPATIENT_CLINIC_OR_DEPARTMENT_OTHER): Payer: Federal, State, Local not specified - PPO | Admitting: Hematology and Oncology

## 2021-07-30 VITALS — BP 132/91 | HR 76 | Temp 97.7°F | Resp 16

## 2021-07-30 DIAGNOSIS — K5900 Constipation, unspecified: Secondary | ICD-10-CM | POA: Diagnosis not present

## 2021-07-30 DIAGNOSIS — C50411 Malignant neoplasm of upper-outer quadrant of right female breast: Secondary | ICD-10-CM | POA: Diagnosis not present

## 2021-07-30 DIAGNOSIS — Z171 Estrogen receptor negative status [ER-]: Secondary | ICD-10-CM

## 2021-07-30 DIAGNOSIS — R5383 Other fatigue: Secondary | ICD-10-CM | POA: Diagnosis not present

## 2021-07-30 DIAGNOSIS — R197 Diarrhea, unspecified: Secondary | ICD-10-CM | POA: Diagnosis not present

## 2021-07-30 DIAGNOSIS — D509 Iron deficiency anemia, unspecified: Secondary | ICD-10-CM | POA: Diagnosis not present

## 2021-07-30 DIAGNOSIS — E785 Hyperlipidemia, unspecified: Secondary | ICD-10-CM | POA: Diagnosis not present

## 2021-07-30 DIAGNOSIS — Z5111 Encounter for antineoplastic chemotherapy: Secondary | ICD-10-CM | POA: Diagnosis not present

## 2021-07-30 DIAGNOSIS — D6481 Anemia due to antineoplastic chemotherapy: Secondary | ICD-10-CM | POA: Diagnosis not present

## 2021-07-30 DIAGNOSIS — Z5112 Encounter for antineoplastic immunotherapy: Secondary | ICD-10-CM | POA: Diagnosis not present

## 2021-07-30 DIAGNOSIS — Z95828 Presence of other vascular implants and grafts: Secondary | ICD-10-CM

## 2021-07-30 DIAGNOSIS — Z5189 Encounter for other specified aftercare: Secondary | ICD-10-CM | POA: Diagnosis not present

## 2021-07-30 DIAGNOSIS — E559 Vitamin D deficiency, unspecified: Secondary | ICD-10-CM | POA: Diagnosis not present

## 2021-07-30 DIAGNOSIS — T451X5A Adverse effect of antineoplastic and immunosuppressive drugs, initial encounter: Secondary | ICD-10-CM | POA: Diagnosis not present

## 2021-07-30 LAB — CBC WITH DIFFERENTIAL/PLATELET
Abs Immature Granulocytes: 0.03 10*3/uL (ref 0.00–0.07)
Basophils Absolute: 0 10*3/uL (ref 0.0–0.1)
Basophils Relative: 0 %
Eosinophils Absolute: 0 10*3/uL (ref 0.0–0.5)
Eosinophils Relative: 0 %
HCT: 31.2 % — ABNORMAL LOW (ref 36.0–46.0)
Hemoglobin: 10.3 g/dL — ABNORMAL LOW (ref 12.0–15.0)
Immature Granulocytes: 0 %
Lymphocytes Relative: 9 %
Lymphs Abs: 0.8 10*3/uL (ref 0.7–4.0)
MCH: 29.3 pg (ref 26.0–34.0)
MCHC: 33 g/dL (ref 30.0–36.0)
MCV: 88.6 fL (ref 80.0–100.0)
Monocytes Absolute: 0.1 10*3/uL (ref 0.1–1.0)
Monocytes Relative: 1 %
Neutro Abs: 7.5 10*3/uL (ref 1.7–7.7)
Neutrophils Relative %: 90 %
Platelets: 261 10*3/uL (ref 150–400)
RBC: 3.52 MIL/uL — ABNORMAL LOW (ref 3.87–5.11)
RDW: 15 % (ref 11.5–15.5)
WBC: 8.5 10*3/uL (ref 4.0–10.5)
nRBC: 0 % (ref 0.0–0.2)

## 2021-07-30 LAB — COMPREHENSIVE METABOLIC PANEL
ALT: 24 U/L (ref 0–44)
AST: 18 U/L (ref 15–41)
Albumin: 4.3 g/dL (ref 3.5–5.0)
Alkaline Phosphatase: 106 U/L (ref 38–126)
Anion gap: 6 (ref 5–15)
BUN: 16 mg/dL (ref 6–20)
CO2: 27 mmol/L (ref 22–32)
Calcium: 9.8 mg/dL (ref 8.9–10.3)
Chloride: 105 mmol/L (ref 98–111)
Creatinine, Ser: 1.07 mg/dL — ABNORMAL HIGH (ref 0.44–1.00)
GFR, Estimated: >60 mL/min (ref >60)
Glucose, Bld: 141 mg/dL — ABNORMAL HIGH (ref 70–99)
Potassium: 4.3 mmol/L (ref 3.5–5.1)
Sodium: 138 mmol/L (ref 135–145)
Total Bilirubin: 0.5 mg/dL (ref 0.3–1.2)
Total Protein: 7.5 g/dL (ref 6.5–8.1)

## 2021-07-30 MED ORDER — SODIUM CHLORIDE 0.9 % IV SOLN: Freq: Once | INTRAVENOUS | Status: AC

## 2021-07-30 MED ORDER — PALONOSETRON HCL INJECTION 0.25 MG/5ML
0.2500 mg | Freq: Once | INTRAVENOUS | Status: AC
  Administered 2021-07-30: 0.25 mg via INTRAVENOUS
  Filled 2021-07-30: qty 5

## 2021-07-30 MED ORDER — DIPHENHYDRAMINE HCL 25 MG PO CAPS
25.0000 mg | ORAL_CAPSULE | Freq: Once | ORAL | Status: AC
  Administered 2021-07-30: 25 mg via ORAL
  Filled 2021-07-30: qty 1

## 2021-07-30 MED ORDER — SODIUM CHLORIDE 0.9 % IV SOLN
600.0000 mg | Freq: Once | INTRAVENOUS | Status: AC
  Administered 2021-07-30: 600 mg via INTRAVENOUS
  Filled 2021-07-30: qty 60

## 2021-07-30 MED ORDER — SODIUM CHLORIDE 0.9 % IV SOLN
75.0000 mg/m2 | Freq: Once | INTRAVENOUS | Status: AC
  Administered 2021-07-30: 150 mg via INTRAVENOUS
  Filled 2021-07-30: qty 15

## 2021-07-30 MED ORDER — SODIUM CHLORIDE 0.9% FLUSH
10.0000 mL | Freq: Once | INTRAVENOUS | Status: AC
  Administered 2021-07-30: 10 mL

## 2021-07-30 MED ORDER — SODIUM CHLORIDE 0.9 % IV SOLN
10.0000 mg | Freq: Once | INTRAVENOUS | Status: AC
  Administered 2021-07-30: 10 mg via INTRAVENOUS
  Filled 2021-07-30: qty 10

## 2021-07-30 MED ORDER — SODIUM CHLORIDE 0.9 % IV SOLN
150.0000 mg | Freq: Once | INTRAVENOUS | Status: AC
  Administered 2021-07-30: 150 mg via INTRAVENOUS
  Filled 2021-07-30: qty 150

## 2021-07-30 MED ORDER — ACETAMINOPHEN 325 MG PO TABS
650.0000 mg | ORAL_TABLET | Freq: Once | ORAL | Status: AC
  Administered 2021-07-30: 650 mg via ORAL
  Filled 2021-07-30: qty 2

## 2021-07-30 MED ORDER — HEPARIN SOD (PORK) LOCK FLUSH 100 UNIT/ML IV SOLN
500.0000 [IU] | Freq: Once | INTRAVENOUS | Status: AC | PRN
  Administered 2021-07-30: 500 [IU]

## 2021-07-30 MED ORDER — TRASTUZUMAB-DKST CHEMO 150 MG IV SOLR
6.0000 mg/kg | Freq: Once | INTRAVENOUS | Status: AC
  Administered 2021-07-30: 504 mg via INTRAVENOUS
  Filled 2021-07-30: qty 24

## 2021-07-30 MED ORDER — SODIUM CHLORIDE 0.9 % IV SOLN
420.0000 mg | Freq: Once | INTRAVENOUS | Status: AC
  Administered 2021-07-30: 420 mg via INTRAVENOUS
  Filled 2021-07-30: qty 14

## 2021-07-30 MED ORDER — SODIUM CHLORIDE 0.9% FLUSH
10.0000 mL | INTRAVENOUS | Status: DC | PRN
  Administered 2021-07-30: 10 mL

## 2021-07-30 NOTE — Patient Instructions (Signed)
[facility]  Discharge Instructions: Thank you for choosing [facility] to provide your oncology and hematology care.   If you have a lab appointment with the [facility], please go directly to the [facility] and check in at the registration area.   Wear comfortable clothing and clothing appropriate for easy access to any Portacath or PICC line.   We strive to give you quality time with your provider. You may need to reschedule your appointment if you arrive late (15 or more minutes).  Arriving late affects you and other patients whose appointments are after yours.  Also, if you miss three or more appointments without notifying the office, you may be dismissed from the clinic at the provider's discretion.      For prescription refill requests, have your pharmacy contact our office and allow 72 hours for refills to be completed.    Today you received the following chemotherapy and/or immunotherapy agents taxotere, carboplatin, herceptin, perjeta   To help prevent nausea and vomiting after your treatment, we encourage you to take your nausea medication as directed.  BELOW ARE SYMPTOMS THAT SHOULD BE REPORTED IMMEDIATELY: *FEVER GREATER THAN 100.4 F (38 C) OR HIGHER *CHILLS OR SWEATING *NAUSEA AND VOMITING THAT IS NOT CONTROLLED WITH YOUR NAUSEA MEDICATION *UNUSUAL SHORTNESS OF BREATH *UNUSUAL BRUISING OR BLEEDING *URINARY PROBLEMS (pain or burning when urinating, or frequent urination) *BOWEL PROBLEMS (unusual diarrhea, constipation, pain near the anus) TENDERNESS IN MOUTH AND THROAT WITH OR WITHOUT PRESENCE OF ULCERS (sore throat, sores in mouth, or a toothache) UNUSUAL RASH, SWELLING OR PAIN  UNUSUAL VAGINAL DISCHARGE OR ITCHING   Items with * indicate a potential emergency and should be followed up as soon as possible or go to the Emergency Department if any problems should occur.  Please show the CHEMOTHERAPY ALERT CARD or  IMMUNOTHERAPY ALERT CARD at check-in to the Emergency Department and triage nurse.  Should you have questions after your visit or need to cancel or reschedule your appointment, please contact [facility]  Dept: [phone]  and follow the prompts.  Office hours are 8:00 a.m. to 4:30 p.m. Monday - Friday. Please note that voicemails left after 4:00 p.m. may not be returned until the following business day.  We are closed weekends and major holidays. You have access to a nurse at all times for urgent questions. Please call the main number to the clinic Dept: [phone] and follow the prompts.   For any non-urgent questions, you may also contact your provider using MyChart. We now offer e-Visits for anyone 45 and older to request care online for non-urgent symptoms. For details visit [email or web address].   Also download the MyChart app! Go to the app store, search "MyChart", open the app, select [facility], and log in with your MyChart username and password.  Masks are optional in the cancer centers. If you would like for your care team to wear a mask while they are taking care of you, please let them know. For doctor visits, patients may have with them one support person who is at least 54 years old. At this time, visitors are not allowed in the infusion area.

## 2021-07-30 NOTE — Assessment & Plan Note (Addendum)
06/14/2018: Right lumpectomy: Intraductal papilloma with usual ductal hyperplasia, 1 benign lymph node (decided against tamoxifen) 05/19/2021:Screening mammogram detected right breast density. 2 lymph nodes were noted biopsy appointment was benign. Ultrasound revealed 2.2 cm mass at 11 o'clock position right breast biopsy: Grade 3 IDC with necrosis, ER 0%, PR 0%, Ki-67 60%, HER2 3+ positive by IHC  Treatment Plan: 1. Neoadjuvant chemotherapy with TCH Perjeta 6 cycles followed by Herceptin Perjeta maintenance versus Kadcyla maintenance (based on response to neoadjuvant chemo) for 1 year 2. Followed by breast conserving surgery with sentinel lymph node study 3. Followed by adjuvant radiation therapy  MRI Breast 06/08/21: Rt Breast cancer 2.8 cm, additional NME total 6.5 cm, Left breast 0.6 cm retroareolar mass, additional indeterminate NME 3.5 cm (Need MRI guided biopsies Rt Br and Left Breast) CT CAP 06/10/21: Breast masses and Bil Axillary LN, 4 mm Rt LL nodule, tiny hepatic densities 4 mm too small, nodular area inferior bladder (wall thickening vs ext effacement) Bone scan 06/10/21: Benign --------------------------------------------------------------------------------------------------------------- Current Treatment:Cycle 3 TCHP ECHO EF 60-65%  Chemo Toxicities: 1.  Severe pelvic bone pain: No bone pain with this cycle 2. diarrhea: 3-4 loose stools per day: Improved with Imodium but it led to constipation. 3.  Fatigue: Profound lasting for a week 4.  Chemotherapy-induced anemia: Hemoglobin is 10.3 5.  Dark line on her forehead: Unclear etiology  She denied any nausea or vomiting. Labs do not show any evidence of neutropenia therefore we will keep the dosage the same.  RTC in 3 weeks for cycle 4

## 2021-08-01 ENCOUNTER — Other Ambulatory Visit: Payer: Self-pay

## 2021-08-01 ENCOUNTER — Inpatient Hospital Stay: Payer: Federal, State, Local not specified - PPO

## 2021-08-01 VITALS — BP 125/88 | HR 92 | Temp 98.2°F

## 2021-08-01 DIAGNOSIS — Z5189 Encounter for other specified aftercare: Secondary | ICD-10-CM | POA: Diagnosis not present

## 2021-08-01 DIAGNOSIS — E785 Hyperlipidemia, unspecified: Secondary | ICD-10-CM | POA: Diagnosis not present

## 2021-08-01 DIAGNOSIS — D6481 Anemia due to antineoplastic chemotherapy: Secondary | ICD-10-CM | POA: Diagnosis not present

## 2021-08-01 DIAGNOSIS — Z171 Estrogen receptor negative status [ER-]: Secondary | ICD-10-CM | POA: Diagnosis not present

## 2021-08-01 DIAGNOSIS — R197 Diarrhea, unspecified: Secondary | ICD-10-CM | POA: Diagnosis not present

## 2021-08-01 DIAGNOSIS — D509 Iron deficiency anemia, unspecified: Secondary | ICD-10-CM | POA: Diagnosis not present

## 2021-08-01 DIAGNOSIS — C50411 Malignant neoplasm of upper-outer quadrant of right female breast: Secondary | ICD-10-CM | POA: Diagnosis not present

## 2021-08-01 DIAGNOSIS — Z5112 Encounter for antineoplastic immunotherapy: Secondary | ICD-10-CM | POA: Diagnosis not present

## 2021-08-01 DIAGNOSIS — K5900 Constipation, unspecified: Secondary | ICD-10-CM | POA: Diagnosis not present

## 2021-08-01 DIAGNOSIS — T451X5A Adverse effect of antineoplastic and immunosuppressive drugs, initial encounter: Secondary | ICD-10-CM | POA: Diagnosis not present

## 2021-08-01 DIAGNOSIS — Z5111 Encounter for antineoplastic chemotherapy: Secondary | ICD-10-CM | POA: Diagnosis not present

## 2021-08-01 DIAGNOSIS — R5383 Other fatigue: Secondary | ICD-10-CM | POA: Diagnosis not present

## 2021-08-01 DIAGNOSIS — E559 Vitamin D deficiency, unspecified: Secondary | ICD-10-CM | POA: Diagnosis not present

## 2021-08-01 MED ORDER — PEGFILGRASTIM-CBQV 6 MG/0.6ML ~~LOC~~ SOSY
6.0000 mg | PREFILLED_SYRINGE | Freq: Once | SUBCUTANEOUS | Status: AC
  Administered 2021-08-01: 6 mg via SUBCUTANEOUS
  Filled 2021-08-01: qty 0.6

## 2021-08-07 ENCOUNTER — Telehealth: Payer: Self-pay

## 2021-08-07 ENCOUNTER — Other Ambulatory Visit: Payer: Self-pay

## 2021-08-07 DIAGNOSIS — N6091 Unspecified benign mammary dysplasia of right breast: Secondary | ICD-10-CM

## 2021-08-07 NOTE — Telephone Encounter (Signed)
This RN returned patient's call regarding symptoms of cramping in hands/ bilateral legs. Patient verbalized understanding to remain hydrated and try to eat foods that are rich in potassium such as mustard, bananas and dark leafy greens. Patient was advised that unfortunately we can not see her today in [facility] but we can see her on Monday. Should the patient experience heart palpitations, shortness of breath or any other concerning symptoms over the weekend, she needs to go to the ER. Patients states she will plan to come to [facility] for port flush w/ labs at 10 and [facility] visit with Eloise Harman., PA at 1030.

## 2021-08-07 NOTE — Telephone Encounter (Signed)
Error

## 2021-08-07 NOTE — Progress Notes (Signed)
Labs entered for [facility] visit scheduled for 08/10/21.

## 2021-08-10 ENCOUNTER — Inpatient Hospital Stay: Payer: Federal, State, Local not specified - PPO

## 2021-08-10 ENCOUNTER — Inpatient Hospital Stay: Payer: Federal, State, Local not specified - PPO | Admitting: Physician Assistant

## 2021-08-19 ENCOUNTER — Encounter: Payer: Self-pay | Admitting: *Deleted

## 2021-08-19 MED FILL — Dexamethasone Sodium Phosphate Inj 100 MG/10ML: INTRAMUSCULAR | Qty: 1 | Status: AC

## 2021-08-19 NOTE — Progress Notes (Signed)
Patient Care Team: Lois Huxley, PA as PCP - General (Family Medicine) Mauro Kaufmann, RN as Oncology Nurse Navigator Rockwell Germany, RN as Oncology Nurse Navigator Erroll Luna, MD as Consulting Physician (General Surgery) Nicholas Lose, MD as Consulting Physician (Hematology and Oncology) Eppie Gibson, MD as Attending Physician (Radiation Oncology)  DIAGNOSIS: No diagnosis found.  SUMMARY OF ONCOLOGIC HISTORY: Oncology History  Malignant neoplasm of upper-outer quadrant of right breast in female, estrogen receptor negative ([place])  06/14/2018 Surgery   06/14/2018: Right lumpectomy: Intraductal papilloma with usual ductal hyperplasia, 1 benign lymph node (decided against tamoxifen)   05/19/2021 Relapse/Recurrence   Screening mammogram detected right breast density.  2 lymph nodes were noted biopsy appointment was benign.  Ultrasound revealed 2.2 cm mass at 11 o'clock position right breast biopsy: Grade 3 IDC with necrosis, ER 0%, PR 0%, Ki-67 60%, HER2 3+ positive by [facility]   05/27/2021 Cancer Staging   Staging form: Breast, AJCC 8th Edition - Clinical: Stage IIA (cT2, cN0, cM0, G3, ER-, PR-, HER2+) - Signed by Nicholas Lose, MD on 05/27/2021 Stage prefix: Initial diagnosis Histologic grading system: 3 grade system   06/18/2021 -  Chemotherapy   Patient is on Treatment Plan : BREAST  Docetaxel + Carboplatin + Trastuzumab + Pertuzumab  (TCHP) q21d        CHIEF COMPLIANT: Follow-up cycle 4 TCHP     INTERVAL HISTORY: Patricia Houston is a 56 y.o. female is here because of recent diagnosis of right breast cancer. She presents to the clinic today for a follow-up.   ALLERGIES:  has No Known Allergies.  MEDICATIONS:  Current Outpatient Medications  Medication Sig Dispense Refill   Cetirizine HCl (ZYRTEC PO) Take by mouth. (Patient not taking: Reported on 07/09/2021)     cholecalciferol (VITAMIN D3) 25 MCG (1000 UNIT) tablet Take 1 tablet (1,000 Units total) by mouth daily.      Cyanocobalamin (VITAMIN B-12) 3000 MCG SUBL Place 1 tablet under the tongue daily.     dexamethasone (DECADRON) 4 MG tablet Take 1 tablet (4 mg total) by mouth daily. Take 1 tablet day before chemo and 1 tablet day after chemo with food 12 tablet 0   Fe Cbn-Fe Gluc-FA-B12-C-DSS (FERRALET 90) 90-1 MG TABS Take 1 tablet by mouth daily.     ferrous sulfate 325 (65 FE) MG tablet 1 tablet (Patient not taking: Reported on 07/09/2021)     ibuprofen (ADVIL) 200 MG tablet Take 200 mg by mouth as needed for moderate pain.     lidocaine-prilocaine (EMLA) cream Apply to affected area once 30 g 3   loratadine (CLARITIN) 10 MG tablet 1 tablet     ondansetron (ZOFRAN) 8 MG tablet Take 1 tablet (8 mg total) by mouth 2 (two) times daily as needed (Nausea or vomiting). Start on the third day after chemotherapy. 30 tablet 1   prochlorperazine (COMPAZINE) 10 MG tablet Take 1 tablet (10 mg total) by mouth every 6 (six) hours as needed (Nausea or vomiting). 30 tablet 1   No current facility-administered medications for this visit.    PHYSICAL EXAMINATION: ECOG PERFORMANCE STATUS: {CHL ONC ECOG PS:[phone]}  There were no vitals filed for this visit. There were no vitals filed for this visit.  BREAST:*** No palpable masses or nodules in either right or left breasts. No palpable axillary supraclavicular or infraclavicular adenopathy no breast tenderness or nipple discharge. (exam performed in the presence of a chaperone)  LABORATORY DATA:  I have reviewed the data as listed  Latest Ref Rng & Units 07/30/2021    8:29 AM 07/09/2021    8:10 AM 06/24/2021   11:57 AM  CMP  Glucose 70 - 99 mg/dL 141  110  105   BUN 6 - 20 mg/dL '16  17  12   ' Creatinine 0.44 - 1.00 mg/dL 1.07  0.88  0.85   Sodium 135 - 145 mmol/L 138  137  134   Potassium 3.5 - 5.1 mmol/L 4.3  4.0  4.3   Chloride 98 - 111 mmol/L 105  104  103   CO2 22 - 32 mmol/L '27  27  27   ' Calcium 8.9 - 10.3 mg/dL 9.8  10.0  10.0   Total Protein 6.5 - 8.1 g/dL  7.5  7.4  7.7   Total Bilirubin 0.3 - 1.2 mg/dL 0.5  0.5  0.4   Alkaline Phos 38 - 126 U/L 106  97  92   AST 15 - 41 U/L '18  18  18   ' ALT 0 - 44 U/L 24  29  33     Lab Results  Component Value Date   WBC 8.5 07/30/2021   HGB 10.3 (L) 07/30/2021   HCT 31.2 (L) 07/30/2021   MCV 88.6 07/30/2021   PLT 261 07/30/2021   NEUTROABS 7.5 07/30/2021    ASSESSMENT & PLAN:  No problem-specific Assessment & Plan notes found for this encounter.    No orders of the defined types were placed in this encounter.  The patient has a good understanding of the overall plan. she agrees with it. she will call with any problems that may develop before the next visit here. Total time spent: 30 mins including face to face time and time spent for planning, charting and co-ordination of care   Suzzette Righter, [place] 08/19/21  I Gardiner Coins am scribing for Dr. Lindi Adie  ***

## 2021-08-20 ENCOUNTER — Inpatient Hospital Stay: Payer: Federal, State, Local not specified - PPO | Attending: Hematology and Oncology

## 2021-08-20 ENCOUNTER — Other Ambulatory Visit: Payer: Self-pay | Admitting: *Deleted

## 2021-08-20 ENCOUNTER — Inpatient Hospital Stay: Payer: Federal, State, Local not specified - PPO

## 2021-08-20 ENCOUNTER — Inpatient Hospital Stay (HOSPITAL_BASED_OUTPATIENT_CLINIC_OR_DEPARTMENT_OTHER): Payer: Federal, State, Local not specified - PPO | Admitting: Hematology and Oncology

## 2021-08-20 ENCOUNTER — Other Ambulatory Visit: Payer: Self-pay

## 2021-08-20 DIAGNOSIS — M898X9 Other specified disorders of bone, unspecified site: Secondary | ICD-10-CM | POA: Insufficient documentation

## 2021-08-20 DIAGNOSIS — Z5112 Encounter for antineoplastic immunotherapy: Secondary | ICD-10-CM | POA: Diagnosis not present

## 2021-08-20 DIAGNOSIS — T451X5A Adverse effect of antineoplastic and immunosuppressive drugs, initial encounter: Secondary | ICD-10-CM | POA: Diagnosis not present

## 2021-08-20 DIAGNOSIS — Z5111 Encounter for antineoplastic chemotherapy: Secondary | ICD-10-CM | POA: Insufficient documentation

## 2021-08-20 DIAGNOSIS — C50411 Malignant neoplasm of upper-outer quadrant of right female breast: Secondary | ICD-10-CM | POA: Diagnosis not present

## 2021-08-20 DIAGNOSIS — Z171 Estrogen receptor negative status [ER-]: Secondary | ICD-10-CM | POA: Diagnosis not present

## 2021-08-20 DIAGNOSIS — R5383 Other fatigue: Secondary | ICD-10-CM | POA: Insufficient documentation

## 2021-08-20 DIAGNOSIS — Z95828 Presence of other vascular implants and grafts: Secondary | ICD-10-CM

## 2021-08-20 DIAGNOSIS — R197 Diarrhea, unspecified: Secondary | ICD-10-CM | POA: Diagnosis not present

## 2021-08-20 DIAGNOSIS — Z79899 Other long term (current) drug therapy: Secondary | ICD-10-CM | POA: Diagnosis not present

## 2021-08-20 DIAGNOSIS — R531 Weakness: Secondary | ICD-10-CM | POA: Diagnosis not present

## 2021-08-20 DIAGNOSIS — D6481 Anemia due to antineoplastic chemotherapy: Secondary | ICD-10-CM | POA: Diagnosis not present

## 2021-08-20 DIAGNOSIS — N6091 Unspecified benign mammary dysplasia of right breast: Secondary | ICD-10-CM

## 2021-08-20 LAB — COMPREHENSIVE METABOLIC PANEL
ALT: 37 U/L (ref 0–44)
AST: 25 U/L (ref 15–41)
Albumin: 4.2 g/dL (ref 3.5–5.0)
Alkaline Phosphatase: 88 U/L (ref 38–126)
Anion gap: 6 (ref 5–15)
BUN: 20 mg/dL (ref 6–20)
CO2: 27 mmol/L (ref 22–32)
Calcium: 10.2 mg/dL (ref 8.9–10.3)
Chloride: 104 mmol/L (ref 98–111)
Creatinine, Ser: 1.07 mg/dL — ABNORMAL HIGH (ref 0.44–1.00)
GFR, Estimated: >60 mL/min (ref >60)
Glucose, Bld: 126 mg/dL — ABNORMAL HIGH (ref 70–99)
Potassium: 3.9 mmol/L (ref 3.5–5.1)
Sodium: 137 mmol/L (ref 135–145)
Total Bilirubin: 0.4 mg/dL (ref 0.3–1.2)
Total Protein: 7.2 g/dL (ref 6.5–8.1)

## 2021-08-20 LAB — CBC WITH DIFFERENTIAL/PLATELET
Abs Immature Granulocytes: 0.04 10*3/uL (ref 0.00–0.07)
Basophils Absolute: 0 10*3/uL (ref 0.0–0.1)
Basophils Relative: 0 %
Eosinophils Absolute: 0 10*3/uL (ref 0.0–0.5)
Eosinophils Relative: 0 %
HCT: 27.6 % — ABNORMAL LOW (ref 36.0–46.0)
Hemoglobin: 9.3 g/dL — ABNORMAL LOW (ref 12.0–15.0)
Immature Granulocytes: 0 %
Lymphocytes Relative: 11 %
Lymphs Abs: 1 10*3/uL (ref 0.7–4.0)
MCH: 29.8 pg (ref 26.0–34.0)
MCHC: 33.7 g/dL (ref 30.0–36.0)
MCV: 88.5 fL (ref 80.0–100.0)
Monocytes Absolute: 0.3 10*3/uL (ref 0.1–1.0)
Monocytes Relative: 3 %
Neutro Abs: 7.9 10*3/uL — ABNORMAL HIGH (ref 1.7–7.7)
Neutrophils Relative %: 86 %
Platelets: 186 10*3/uL (ref 150–400)
RBC: 3.12 MIL/uL — ABNORMAL LOW (ref 3.87–5.11)
RDW: 15.8 % — ABNORMAL HIGH (ref 11.5–15.5)
WBC: 9.2 10*3/uL (ref 4.0–10.5)
nRBC: 0 % (ref 0.0–0.2)

## 2021-08-20 LAB — URINALYSIS, COMPLETE (UACMP) WITH MICROSCOPIC
Bilirubin Urine: NEGATIVE
Glucose, UA: NEGATIVE mg/dL
Hgb urine dipstick: NEGATIVE
Ketones, ur: NEGATIVE mg/dL
Nitrite: NEGATIVE
Protein, ur: NEGATIVE mg/dL
Specific Gravity, Urine: 1.018 (ref 1.005–1.030)
pH: 5 (ref 5.0–8.0)

## 2021-08-20 LAB — MAGNESIUM: Magnesium: 1.4 mg/dL — ABNORMAL LOW (ref 1.7–2.4)

## 2021-08-20 MED ORDER — SODIUM CHLORIDE 0.9% FLUSH
10.0000 mL | INTRAVENOUS | Status: DC | PRN
  Administered 2021-08-20: 10 mL

## 2021-08-20 MED ORDER — HEPARIN SOD (PORK) LOCK FLUSH 100 UNIT/ML IV SOLN
500.0000 [IU] | Freq: Once | INTRAVENOUS | Status: AC | PRN
  Administered 2021-08-20: 500 [IU]

## 2021-08-20 MED ORDER — PALONOSETRON HCL INJECTION 0.25 MG/5ML
0.2500 mg | Freq: Once | INTRAVENOUS | Status: AC
  Administered 2021-08-20: 0.25 mg via INTRAVENOUS
  Filled 2021-08-20: qty 5

## 2021-08-20 MED ORDER — SODIUM CHLORIDE 0.9 % IV SOLN
10.0000 mg | Freq: Once | INTRAVENOUS | Status: AC
  Administered 2021-08-20: 10 mg via INTRAVENOUS
  Filled 2021-08-20: qty 10

## 2021-08-20 MED ORDER — ACETAMINOPHEN 325 MG PO TABS
650.0000 mg | ORAL_TABLET | Freq: Once | ORAL | Status: AC
  Administered 2021-08-20: 650 mg via ORAL
  Filled 2021-08-20: qty 2

## 2021-08-20 MED ORDER — SODIUM CHLORIDE 0.9% FLUSH
10.0000 mL | Freq: Once | INTRAVENOUS | Status: AC
  Administered 2021-08-20: 10 mL

## 2021-08-20 MED ORDER — SODIUM CHLORIDE 0.9 % IV SOLN
150.0000 mg | Freq: Once | INTRAVENOUS | Status: AC
  Administered 2021-08-20: 150 mg via INTRAVENOUS
  Filled 2021-08-20: qty 150

## 2021-08-20 MED ORDER — TRASTUZUMAB-DKST CHEMO 150 MG IV SOLR
6.0000 mg/kg | Freq: Once | INTRAVENOUS | Status: AC
  Administered 2021-08-20: 504 mg via INTRAVENOUS
  Filled 2021-08-20: qty 24

## 2021-08-20 MED ORDER — MAGNESIUM SULFATE 2 GM/50ML IV SOLN
2.0000 g | Freq: Once | INTRAVENOUS | Status: AC
  Administered 2021-08-20: 2 g via INTRAVENOUS
  Filled 2021-08-20: qty 50

## 2021-08-20 MED ORDER — SODIUM CHLORIDE 0.9 % IV SOLN
420.0000 mg | Freq: Once | INTRAVENOUS | Status: AC
  Administered 2021-08-20: 420 mg via INTRAVENOUS
  Filled 2021-08-20: qty 14

## 2021-08-20 MED ORDER — DIPHENHYDRAMINE HCL 25 MG PO CAPS
25.0000 mg | ORAL_CAPSULE | Freq: Once | ORAL | Status: AC
  Administered 2021-08-20: 25 mg via ORAL
  Filled 2021-08-20: qty 1

## 2021-08-20 MED ORDER — SODIUM CHLORIDE 0.9 % IV SOLN: Freq: Once | INTRAVENOUS | Status: AC

## 2021-08-20 MED ORDER — SODIUM CHLORIDE 0.9 % IV SOLN
75.0000 mg/m2 | Freq: Once | INTRAVENOUS | Status: AC
  Administered 2021-08-20: 150 mg via INTRAVENOUS
  Filled 2021-08-20: qty 15

## 2021-08-20 MED ORDER — SODIUM CHLORIDE 0.9 % IV SOLN
600.0000 mg | Freq: Once | INTRAVENOUS | Status: AC
  Administered 2021-08-20: 600 mg via INTRAVENOUS
  Filled 2021-08-20: qty 60

## 2021-08-20 NOTE — Progress Notes (Signed)
Mag 1.4.  Verbal orders received from MD for pt to receive Mag 2 grams IV today with tx.  Orders placed.

## 2021-08-20 NOTE — Assessment & Plan Note (Signed)
06/14/2018: Right lumpectomy: Intraductal papilloma with usual ductal hyperplasia, 1 benign lymph node (decided against tamoxifen) 05/19/2021:Screening mammogram detected right breast density. 2 lymph nodes were noted biopsy appointment was benign. Ultrasound revealed 2.2 cm mass at 11 o'clock position right breast biopsy: Grade 3 IDC with necrosis, ER 0%, PR 0%, Ki-67 60%, HER2 3+ positive by IHC  Treatment Plan: 1. Neoadjuvant chemotherapy with TCH Perjeta 6 cycles followed by Herceptin Perjeta maintenance versus Kadcyla maintenance (based on response to neoadjuvant chemo) for 1 year 2. Followed by breast conserving surgery with sentinel lymph node study 3. Followed by adjuvant radiation therapy  MRI Breast 06/08/21: Rt Breast cancer 2.8 cm, additional NME total 6.5 cm, Left breast 0.6 cm retroareolar mass, additional indeterminate NME 3.5 cm (Need MRI guided biopsies Rt Br and Left Breast) CT CAP 06/10/21: Breast masses and Bil Axillary LN, 4 mm Rt LL nodule, tiny hepatic densities 4 mm too small, nodular area inferior bladder (wall thickening vs ext effacement) Bone scan 06/10/21: Benign --------------------------------------------------------------------------------------------------------------- Current Treatment:Cycle 4 TCHP ECHO EF 60-65%  Chemo Toxicities: 1.Severe pelvic bone pain: No bone pain with this cycle 2.diarrhea: 3-4 loose stools per day: Improved with Imodium but it led to constipation. 3.Fatigue: Profound lasting for a week 4.  Chemotherapy-induced anemia: Hemoglobin is 10.3 5.  Dark line on her forehead: Unclear etiology  She denied any nausea or vomiting.  RTC in 3 weeks for cycle 5

## 2021-08-20 NOTE — Patient Instructions (Signed)
[facility]ICAL [facility]COLOG[facility]  Discharge Instructions: Thank you for choosing [facility] to provide your oncology and hematology care.   If you have a lab appointment with the Cancer Center, please go directly to the Cancer Center and check in at the registration area.   Wear comfortable clothing and clothing appropriate for easy access to any Portacath or PICC line.   We strive to give you quality time with your provider. You may need to reschedule your appointment if you arrive late (15 or more minutes).  Arriving late affects you and other patients whose appointments are after yours.  Also, if you miss three or more appointments without notifying the office, you may be dismissed from the clinic at the provider's discretion.      For prescription refill requests, have your pharmacy contact our office and allow 72 hours for refills to be completed.    Today you received the following chemotherapy and/or immunotherapy agents trastuzumab, pertuzumab, docetaxel, carboplatin      To help prevent nausea and vomiting after your treatment, we encourage you to take your nausea medication as directed.  BELOW ARE SYMPTOMS THAT SHOULD BE REPORTED IMMEDIATELY: *FEVER GREATER THAN 100.4 F (38 C) OR HIGHER *CHILLS OR SWEATING *NAUSEA AND VOMITING THAT IS NOT CONTROLLED WITH YOUR NAUSEA MEDICATION *UNUSUAL SHORTNESS OF BREATH *UNUSUAL BRUISING OR BLEEDING *URINARY PROBLEMS (pain or burning when urinating, or frequent urination) *BOWEL PROBLEMS (unusual diarrhea, constipation, pain near the anus) TENDERNESS IN MOUTH AND THROAT WITH OR WITHOUT PRESENCE OF ULCERS (sore throat, sores in mouth, or a toothache) UNUSUAL RASH, SWELLING OR PAIN  UNUSUAL VAGINAL DISCHARGE OR ITCHING   Items with * indicate a potential emergency and should be followed up as soon as possible or go to the Emergency Department if any problems should occur.  Please show the CHEMOTHERAPY ALERT CARD or  IMMUNOTHERAPY ALERT CARD at check-in to the Emergency Department and triage nurse.  Should you have questions after your visit or need to cancel or reschedule your appointment, please contact [facility]COL[facility]  Dept: [phone]  and follow the prompts.  Office hours are 8:00 a.m. to 4:30 p.m. Monday - Friday. Please note that voicemails left after 4:00 p.m. may not be returned until the following business day.  We are closed weekends and major holidays. You have access to a nurse at all times for urgent questions. Please call the main number to the clinic Dept: [phone] and follow the prompts.   For any non-urgent questions, you may also contact your provider using MyChart. We now offer e-Visits for anyone 18 and older to request care online for non-urgent symptoms. For details visit [email or web address].   Also download the MyChart app! Go to the app store, search "MyChart", open the app, select [facility], and log in with your MyChart username and password.  Masks are optional in the cancer centers. If you would like for your care team to wear a mask while they are taking care of you, please let them know. You may have one support person who is at least 54 years old accompany you for your appointments. 

## 2021-08-21 ENCOUNTER — Telehealth: Payer: Self-pay | Admitting: Hematology and Oncology

## 2021-08-21 LAB — URINE CULTURE: Culture: 50000 — AB

## 2021-08-21 NOTE — Telephone Encounter (Signed)
Scheduled appointment per 8/17 los. Patient is aware. 

## 2021-08-22 ENCOUNTER — Inpatient Hospital Stay: Payer: Federal, State, Local not specified - PPO

## 2021-08-22 VITALS — BP 121/82 | HR 92 | Temp 97.7°F | Resp 16

## 2021-08-22 DIAGNOSIS — D6481 Anemia due to antineoplastic chemotherapy: Secondary | ICD-10-CM | POA: Diagnosis not present

## 2021-08-22 DIAGNOSIS — M898X9 Other specified disorders of bone, unspecified site: Secondary | ICD-10-CM | POA: Diagnosis not present

## 2021-08-22 DIAGNOSIS — Z5112 Encounter for antineoplastic immunotherapy: Secondary | ICD-10-CM | POA: Diagnosis not present

## 2021-08-22 DIAGNOSIS — C50411 Malignant neoplasm of upper-outer quadrant of right female breast: Secondary | ICD-10-CM | POA: Diagnosis not present

## 2021-08-22 DIAGNOSIS — R5383 Other fatigue: Secondary | ICD-10-CM | POA: Diagnosis not present

## 2021-08-22 DIAGNOSIS — R197 Diarrhea, unspecified: Secondary | ICD-10-CM | POA: Diagnosis not present

## 2021-08-22 DIAGNOSIS — Z171 Estrogen receptor negative status [ER-]: Secondary | ICD-10-CM | POA: Diagnosis not present

## 2021-08-22 DIAGNOSIS — T451X5A Adverse effect of antineoplastic and immunosuppressive drugs, initial encounter: Secondary | ICD-10-CM | POA: Diagnosis not present

## 2021-08-22 DIAGNOSIS — Z5111 Encounter for antineoplastic chemotherapy: Secondary | ICD-10-CM | POA: Diagnosis not present

## 2021-08-22 DIAGNOSIS — R531 Weakness: Secondary | ICD-10-CM | POA: Diagnosis not present

## 2021-08-22 DIAGNOSIS — Z79899 Other long term (current) drug therapy: Secondary | ICD-10-CM | POA: Diagnosis not present

## 2021-08-22 MED ORDER — PEGFILGRASTIM-CBQV 6 MG/0.6ML ~~LOC~~ SOSY
6.0000 mg | PREFILLED_SYRINGE | Freq: Once | SUBCUTANEOUS | Status: AC
  Administered 2021-08-22: 6 mg via SUBCUTANEOUS

## 2021-08-26 ENCOUNTER — Other Ambulatory Visit: Payer: Self-pay

## 2021-08-26 ENCOUNTER — Inpatient Hospital Stay: Payer: Federal, State, Local not specified - PPO

## 2021-08-26 ENCOUNTER — Other Ambulatory Visit: Payer: Self-pay | Admitting: *Deleted

## 2021-08-26 VITALS — BP 120/78 | HR 90 | Temp 97.6°F | Resp 18

## 2021-08-26 DIAGNOSIS — Z5112 Encounter for antineoplastic immunotherapy: Secondary | ICD-10-CM | POA: Diagnosis not present

## 2021-08-26 DIAGNOSIS — Z5111 Encounter for antineoplastic chemotherapy: Secondary | ICD-10-CM | POA: Diagnosis not present

## 2021-08-26 DIAGNOSIS — T451X5A Adverse effect of antineoplastic and immunosuppressive drugs, initial encounter: Secondary | ICD-10-CM | POA: Diagnosis not present

## 2021-08-26 DIAGNOSIS — D6481 Anemia due to antineoplastic chemotherapy: Secondary | ICD-10-CM | POA: Diagnosis not present

## 2021-08-26 DIAGNOSIS — Z171 Estrogen receptor negative status [ER-]: Secondary | ICD-10-CM | POA: Diagnosis not present

## 2021-08-26 DIAGNOSIS — R197 Diarrhea, unspecified: Secondary | ICD-10-CM | POA: Diagnosis not present

## 2021-08-26 DIAGNOSIS — M898X9 Other specified disorders of bone, unspecified site: Secondary | ICD-10-CM | POA: Diagnosis not present

## 2021-08-26 DIAGNOSIS — C50411 Malignant neoplasm of upper-outer quadrant of right female breast: Secondary | ICD-10-CM | POA: Diagnosis not present

## 2021-08-26 DIAGNOSIS — Z95828 Presence of other vascular implants and grafts: Secondary | ICD-10-CM

## 2021-08-26 DIAGNOSIS — R5383 Other fatigue: Secondary | ICD-10-CM | POA: Diagnosis not present

## 2021-08-26 DIAGNOSIS — R531 Weakness: Secondary | ICD-10-CM | POA: Diagnosis not present

## 2021-08-26 DIAGNOSIS — Z79899 Other long term (current) drug therapy: Secondary | ICD-10-CM | POA: Diagnosis not present

## 2021-08-26 MED ORDER — HEPARIN SOD (PORK) LOCK FLUSH 100 UNIT/ML IV SOLN
500.0000 [IU] | Freq: Once | INTRAVENOUS | Status: AC
  Administered 2021-08-26: 500 [IU]

## 2021-08-26 MED ORDER — SODIUM CHLORIDE 0.9 % IV SOLN: Freq: Once | INTRAVENOUS | Status: AC

## 2021-08-26 MED ORDER — ONDANSETRON HCL 4 MG/2ML IJ SOLN
8.0000 mg | Freq: Once | INTRAMUSCULAR | Status: AC
  Administered 2021-08-26: 8 mg via INTRAVENOUS
  Filled 2021-08-26: qty 4

## 2021-08-26 MED ORDER — SODIUM CHLORIDE 0.9% FLUSH
10.0000 mL | Freq: Once | INTRAVENOUS | Status: AC
  Administered 2021-08-26: 10 mL

## 2021-08-26 MED ORDER — SODIUM CHLORIDE 0.9 % IV SOLN: 8.0000 mg | Freq: Once | INTRAVENOUS | Status: DC

## 2021-08-26 NOTE — Patient Instructions (Signed)
Rehydration, Adult Rehydration is the replacement of body fluids, salts, and minerals (electrolytes) that are lost during dehydration. Dehydration is when there is not enough water or other fluids in the body. This happens when you lose more fluids than you take in. Common causes of dehydration include: Not drinking enough fluids. This can occur when you are ill or doing activities that require a lot of energy, especially in hot weather. Conditions that cause loss of water or other fluids, such as diarrhea, vomiting, sweating, or urinating a lot. Other illnesses, such as fever or infection. Certain medicines, such as those that remove excess fluid from the body (diuretics). Symptoms of mild or moderate dehydration may include thirst, dry lips and mouth, and dizziness. Symptoms of severe dehydration may include increased heart rate, confusion, fainting, and not urinating. For severe dehydration, you may need to get fluids through an IV at the hospital. For mild or moderate dehydration, you can usually rehydrate at home by drinking certain fluids as told by your health care provider. What are the risks? Generally, rehydration is safe. However, taking in too much fluid (overhydration) can be a problem. This is rare. Overhydration can cause an electrolyte imbalance, kidney failure, or a decrease in salt (sodium) levels in the body. Supplies needed You will need an oral rehydration solution (ORS) if your health care provider tells you to use one. This is a drink to treat dehydration. It can be found in pharmacies and retail stores. How to rehydrate Fluids Follow instructions from your health care provider for rehydration. The kind of fluid and the amount you should drink depend on your condition. In general, you should choose drinks that you prefer. If told by your health care provider, drink an ORS. Make an ORS by following instructions on the package. Start by drinking small amounts, about  cup (120  mL) every 5-10 minutes. Slowly increase how much you drink until you have taken the amount recommended by your health care provider. Drink enough clear fluids to keep your urine pale yellow. If you were told to drink an ORS, finish it first, then start slowly drinking other clear fluids. Drink fluids such as: Water. This includes sparkling water and flavored water. Drinking only water can lead to having too little sodium in your body (hyponatremia). Follow the advice of your health care provider. Water from ice chips you suck on. Fruit juice with water you add to it (diluted). Sports drinks. Hot or cold herbal teas. Broth-based soups. Milk or milk products. Food Follow instructions from your health care provider about what to eat while you rehydrate. Your health care provider may recommend that you slowly begin eating regular foods in small amounts. Eat foods that contain a healthy balance of electrolytes, such as bananas, oranges, potatoes, tomatoes, and spinach. Avoid foods that are greasy or contain a lot of sugar. In some cases, you may get nutrition through a feeding tube that is passed through your nose and into your stomach (nasogastric tube, or NG tube). This may be done if you have uncontrolled vomiting or diarrhea. Beverages to avoid  Certain beverages may make dehydration worse. While you rehydrate, avoid drinking alcohol. How to tell if you are recovering from dehydration You may be recovering from dehydration if: You are urinating more often than before you started rehydrating. Your urine is pale yellow. Your energy level improves. You vomit less frequently. You have diarrhea less frequently. Your appetite improves or returns to normal. You feel less dizzy or less light-headed.   Your skin tone and color start to look more normal. Follow these instructions at home: Take over-the-counter and prescription medicines only as told by your health care provider. Do not take sodium  tablets. Doing this can lead to having too much sodium in your body (hypernatremia). Contact a health care provider if: You continue to have symptoms of mild or moderate dehydration, such as: Thirst. Dry lips. Slightly dry mouth. Dizziness. Dark urine or less urine than normal. Muscle cramps. You continue to vomit or have diarrhea. Get help right away if you: Have symptoms of dehydration that get worse. Have a fever. Have a severe headache. Have been vomiting and the following happens: Your vomiting gets worse or does not go away. Your vomit includes blood or green matter (bile). You cannot eat or drink without vomiting. Have problems with urination or bowel movements, such as: Diarrhea that gets worse or does not go away. Blood in your stool (feces). This may cause stool to look black and tarry. Not urinating, or urinating only a small amount of very dark urine, within 6-8 hours. Have trouble breathing. Have symptoms that get worse with treatment. These symptoms may represent a serious problem that is an emergency. Do not wait to see if the symptoms will go away. Get medical help right away. Call your local emergency services (911 in the [place] Do not drive yourself to the hospital. Summary Rehydration is the replacement of body fluids and minerals (electrolytes) that are lost during dehydration. Follow instructions from your health care provider for rehydration. The kind of fluid and amount you should drink depend on your condition. Slowly increase how much you drink until you have taken the amount recommended by your health care provider. Contact your health care provider if you continue to show signs of mild or moderate dehydration. This information is not intended to replace advice given to you by your health care provider. Make sure you discuss any questions you have with your health care provider. Document Revised: 02/21/2019 Document Reviewed: 01/01/2019 Elsevier Patient  Education  2023 [employer] Inc[employer]  

## 2021-09-07 ENCOUNTER — Other Ambulatory Visit: Payer: Self-pay | Admitting: Hematology and Oncology

## 2021-09-07 DIAGNOSIS — Z171 Estrogen receptor negative status [ER-]: Secondary | ICD-10-CM

## 2021-09-09 MED FILL — Fosaprepitant Dimeglumine For IV Infusion 150 MG (Base Eq): INTRAVENOUS | Qty: 5 | Status: AC

## 2021-09-09 MED FILL — Dexamethasone Sodium Phosphate Inj 100 MG/10ML: INTRAMUSCULAR | Qty: 1 | Status: AC

## 2021-09-09 NOTE — Progress Notes (Signed)
Patient Care Team: Lois Huxley, PA as PCP - General (Family Medicine) Mauro Kaufmann, RN as Oncology Nurse Navigator Rockwell Germany, RN as Oncology Nurse Navigator Erroll Luna, MD as Consulting Physician (General Surgery) Nicholas Lose, MD as Consulting Physician (Hematology and Oncology) Eppie Gibson, MD as Attending Physician (Radiation Oncology)  DIAGNOSIS:  Encounter Diagnosis  Name Primary?   Malignant neoplasm of upper-outer quadrant of right breast in female, estrogen receptor negative ([place]) Yes    SUMMARY OF ONCOLOGIC HISTORY: Oncology History  Malignant neoplasm of upper-outer quadrant of right breast in female, estrogen receptor negative ([place])  06/14/2018 Surgery   06/14/2018: Right lumpectomy: Intraductal papilloma with usual ductal hyperplasia, 1 benign lymph node (decided against tamoxifen)   05/19/2021 Relapse/Recurrence   Screening mammogram detected right breast density.  2 lymph nodes were noted biopsy appointment was benign.  Ultrasound revealed 2.2 cm mass at 11 o'clock position right breast biopsy: Grade 3 IDC with necrosis, ER 0%, PR 0%, Ki-67 60%, HER2 3+ positive by [facility]   05/27/2021 Cancer Staging   Staging form: Breast, AJCC 8th Edition - Clinical: Stage IIA (cT2, cN0, cM0, G3, ER-, PR-, HER2+) - Signed by Nicholas Lose, MD on 05/27/2021 Stage prefix: Initial diagnosis Histologic grading system: 3 grade system   06/18/2021 - 08/22/2021 Chemotherapy   Patient is on Treatment Plan : BREAST  Docetaxel + Carboplatin + Trastuzumab + Pertuzumab  (TCHP) q21d      06/18/2021 -  Chemotherapy   Patient is on Treatment Plan : BREAST  Docetaxel + Carboplatin + Trastuzumab + Pertuzumab  (TCHP) q21d / Trastuzumab + Pertuzumab q21d       CHIEF COMPLIANT:  Follow-up cycle 5 TCHP   INTERVAL HISTORY: Patricia Houston is a 56 y.o. female is here because of recent diagnosis of right breast cancer. She presents to the clinic today for a follow-up and treatment. She  denies cramping. She states that she had some fatigue and was having knots in stomach. More of belly discomfort. She reports she had mild diarrhea but had some constipation. Denies nausea. Se did experience numbness in thumb and pointer finger and tips of fingers. Denies dropping objects. She does have dryness in feet.    ALLERGIES:  has No Known Allergies.  MEDICATIONS:  Current Outpatient Medications  Medication Sig Dispense Refill   Cetirizine HCl (ZYRTEC PO) Take by mouth.     cholecalciferol (VITAMIN D3) 25 MCG (1000 UNIT) tablet Take 1 tablet (1,000 Units total) by mouth daily.     Cyanocobalamin (VITAMIN B-12) 3000 MCG SUBL Place 1 tablet under the tongue daily.     Fe Cbn-Fe Gluc-FA-B12-C-DSS (FERRALET 90) 90-1 MG TABS Take 1 tablet by mouth daily.     ferrous sulfate 325 (65 FE) MG tablet      ibuprofen (ADVIL) 200 MG tablet Take 200 mg by mouth as needed for moderate pain.     loratadine (CLARITIN) 10 MG tablet 1 tablet     No current facility-administered medications for this visit.    PHYSICAL EXAMINATION: ECOG PERFORMANCE STATUS: 1 - Symptomatic but completely ambulatory  Vitals:   09/10/21 0849  BP: (!) 137/93  Pulse: (!) 103  Resp: 14  Temp: 98.1 F (36.7 C)  SpO2: 99%   Filed Weights   09/10/21 0849  Weight: 174 lb (78.9 kg)      LABORATORY DATA:  I have reviewed the data as listed    Latest Ref Rng & Units 09/10/2021    8:22 AM  08/20/2021    9:15 AM 07/30/2021    8:29 AM  CMP  Glucose 70 - 99 mg/dL 117  126  141   BUN 6 - 20 mg/dL _0 Creatinine 0.44 - 1.00 mg/dL 1.04  1.07  1.07   Sodium 135 - 145 mmol/L 136  137  138   Potassium 3.5 - 5.1 mmol/L 4.1  3.9  4.3   Chloride 98 - 111 mmol/L 105  104  105   CO2 22 - 32 mmol/L _1 Calcium 8.9 - 10.3 mg/dL 10.0  10.2  9.8   Total Protein 6.5 - 8.1 g/dL 7.3  7.2  7.5   Total Bilirubin 0.3 - 1.2 mg/dL 0.4  0.4  0.5   Alkaline Phos 38 - 126 U/L 94  88  106   AST 15 - 41 U/L _2 ALT 0 - 44 U/L 38  37  24     Lab Results  Component Value Date   WBC 7.2 09/10/2021   HGB 9.0 (L) 09/10/2021   HCT 26.6 (L) 09/10/2021   MCV 90.8 09/10/2021   PLT 132 (L) 09/10/2021   NEUTROABS 6.1 09/10/2021    ASSESSMENT & PLAN:  Malignant neoplasm of upper-outer quadrant of right breast in female, estrogen receptor negative ([place]) 06/14/2018: Right lumpectomy: Intraductal papilloma with usual ductal hyperplasia, 1 benign lymph node (decided against tamoxifen) 05/19/2021: Screening mammogram detected right breast density.  2 lymph nodes were noted biopsy appointment was benign.  Ultrasound revealed 2.2 cm mass at 11 o'clock position right breast biopsy: Grade 3 IDC with necrosis, ER 0%, PR 0%, Ki-67 60%, HER2 3+ positive by IHC   Treatment Plan: 1. Neoadjuvant chemotherapy with TCH Perjeta 6 cycles followed by Herceptin Perjeta maintenance versus Kadcyla maintenance (based on response to neoadjuvant chemo) for 1 year 2. Followed by breast conserving surgery  with sentinel lymph node study 3. Followed by adjuvant radiation therapy   MRI Breast 06/08/21:  Rt Breast cancer 2.8 cm, additional NME total 6.5 cm, Left breast 0.6 cm retroareolar mass, additional indeterminate NME 3.5 cm (Need MRI guided biopsies Rt Br and Left Breast) CT CAP 06/10/21: Breast masses and Bil Axillary LN, 4 mm Rt LL nodule, tiny hepatic densities 4 mm too small, nodular area inferior bladder (wall thickening vs ext effacement) Bone scan 06/10/21: Benign --------------------------------------------------------------------------------------------------------------- Current Treatment: Cycle 5 TCHP ECHO EF 60-65%   Chemo Toxicities: 1.  Severe pelvic bone pain: Mild 2. diarrhea: Alternating with constipation. 3.  Fatigue: Profound lasting for a week 4.  Chemotherapy-induced anemia: Hemoglobin is 9.  If it drops significantly below we may have to lower the dosage of the last cycle 5.  Dark line on her forehead:  Unclear etiology 6.  Diarrhea and leg cramps: We will administer IV fluids on Tuesday after each cycle.   7.  Stomach disturbance: I encouraged her to take Zofran every morning from day 3 today 6 after chemo She denied any nausea or vomiting.   RTC in 3 weeks for cycle 6    Orders Placed This Encounter  Procedures   MR BREAST BILATERAL [facility] [place] [facility]    Standing Status:   Future    Standing Expiration Date:   09/11/2022    Order Specific Question:   If indicated for the ordered procedure, I authorize the administration of contrast media per Radiology protocol    Answer:  Yes    Order Specific Question:   What is the patient's sedation requirement?    Answer:   No Sedation    Order Specific Question:   Does the patient have a pacemaker or implanted devices?    Answer:   No    Order Specific Question:   Preferred imaging location?    Answer:   [facility] (table limit-550lbs)   The patient has a good understanding of the overall plan. she agrees with it. she will call with any problems that may develop before the next visit here. Total time spent: 30 mins including face to face time and time spent for planning, charting and co-ordination of care   Harriette Ohara, MD 09/10/21    I Gardiner Coins am scribing for Dr. Lindi Adie  I have reviewed the above documentation for accuracy and completeness, and I agree with the above.

## 2021-09-10 ENCOUNTER — Other Ambulatory Visit: Payer: Self-pay

## 2021-09-10 ENCOUNTER — Other Ambulatory Visit: Payer: Self-pay | Admitting: *Deleted

## 2021-09-10 ENCOUNTER — Inpatient Hospital Stay (HOSPITAL_BASED_OUTPATIENT_CLINIC_OR_DEPARTMENT_OTHER): Payer: Federal, State, Local not specified - PPO | Admitting: Hematology and Oncology

## 2021-09-10 ENCOUNTER — Inpatient Hospital Stay: Payer: Federal, State, Local not specified - PPO | Attending: Hematology and Oncology

## 2021-09-10 ENCOUNTER — Encounter: Payer: Self-pay | Admitting: *Deleted

## 2021-09-10 ENCOUNTER — Inpatient Hospital Stay: Payer: Federal, State, Local not specified - PPO

## 2021-09-10 VITALS — BP 137/93 | HR 103 | Temp 98.1°F | Resp 14 | Ht 66.0 in | Wt 174.0 lb

## 2021-09-10 DIAGNOSIS — R197 Diarrhea, unspecified: Secondary | ICD-10-CM | POA: Diagnosis not present

## 2021-09-10 DIAGNOSIS — Z5189 Encounter for other specified aftercare: Secondary | ICD-10-CM | POA: Insufficient documentation

## 2021-09-10 DIAGNOSIS — Z171 Estrogen receptor negative status [ER-]: Secondary | ICD-10-CM

## 2021-09-10 DIAGNOSIS — K5900 Constipation, unspecified: Secondary | ICD-10-CM | POA: Diagnosis not present

## 2021-09-10 DIAGNOSIS — D6481 Anemia due to antineoplastic chemotherapy: Secondary | ICD-10-CM | POA: Insufficient documentation

## 2021-09-10 DIAGNOSIS — C50411 Malignant neoplasm of upper-outer quadrant of right female breast: Secondary | ICD-10-CM

## 2021-09-10 DIAGNOSIS — R5383 Other fatigue: Secondary | ICD-10-CM | POA: Diagnosis not present

## 2021-09-10 DIAGNOSIS — Z8249 Family history of ischemic heart disease and other diseases of the circulatory system: Secondary | ICD-10-CM | POA: Diagnosis not present

## 2021-09-10 DIAGNOSIS — Z5112 Encounter for antineoplastic immunotherapy: Secondary | ICD-10-CM | POA: Insufficient documentation

## 2021-09-10 DIAGNOSIS — R000 Tachycardia, unspecified: Secondary | ICD-10-CM | POA: Diagnosis not present

## 2021-09-10 DIAGNOSIS — N6331 Unspecified lump in axillary tail of the right breast: Secondary | ICD-10-CM | POA: Diagnosis not present

## 2021-09-10 DIAGNOSIS — R200 Anesthesia of skin: Secondary | ICD-10-CM | POA: Diagnosis not present

## 2021-09-10 DIAGNOSIS — Z806 Family history of leukemia: Secondary | ICD-10-CM | POA: Insufficient documentation

## 2021-09-10 DIAGNOSIS — Z8042 Family history of malignant neoplasm of prostate: Secondary | ICD-10-CM | POA: Diagnosis not present

## 2021-09-10 DIAGNOSIS — R110 Nausea: Secondary | ICD-10-CM | POA: Diagnosis not present

## 2021-09-10 DIAGNOSIS — T451X5A Adverse effect of antineoplastic and immunosuppressive drugs, initial encounter: Secondary | ICD-10-CM | POA: Insufficient documentation

## 2021-09-10 DIAGNOSIS — Z5111 Encounter for antineoplastic chemotherapy: Secondary | ICD-10-CM | POA: Insufficient documentation

## 2021-09-10 DIAGNOSIS — Z5181 Encounter for therapeutic drug level monitoring: Secondary | ICD-10-CM

## 2021-09-10 DIAGNOSIS — R0602 Shortness of breath: Secondary | ICD-10-CM | POA: Insufficient documentation

## 2021-09-10 DIAGNOSIS — E860 Dehydration: Secondary | ICD-10-CM | POA: Diagnosis not present

## 2021-09-10 DIAGNOSIS — Z803 Family history of malignant neoplasm of breast: Secondary | ICD-10-CM | POA: Insufficient documentation

## 2021-09-10 DIAGNOSIS — Z800 Family history of malignant neoplasm of digestive organs: Secondary | ICD-10-CM | POA: Insufficient documentation

## 2021-09-10 DIAGNOSIS — M898X8 Other specified disorders of bone, other site: Secondary | ICD-10-CM | POA: Diagnosis not present

## 2021-09-10 DIAGNOSIS — Z79899 Other long term (current) drug therapy: Secondary | ICD-10-CM | POA: Insufficient documentation

## 2021-09-10 LAB — CBC WITH DIFFERENTIAL (CANCER CENTER ONLY)
Abs Immature Granulocytes: 0.02 10*3/uL (ref 0.00–0.07)
Basophils Absolute: 0 10*3/uL (ref 0.0–0.1)
Basophils Relative: 0 %
Eosinophils Absolute: 0 10*3/uL (ref 0.0–0.5)
Eosinophils Relative: 0 %
HCT: 26.6 % — ABNORMAL LOW (ref 36.0–46.0)
Hemoglobin: 9 g/dL — ABNORMAL LOW (ref 12.0–15.0)
Immature Granulocytes: 0 %
Lymphocytes Relative: 11 %
Lymphs Abs: 0.8 10*3/uL (ref 0.7–4.0)
MCH: 30.7 pg (ref 26.0–34.0)
MCHC: 33.8 g/dL (ref 30.0–36.0)
MCV: 90.8 fL (ref 80.0–100.0)
Monocytes Absolute: 0.4 10*3/uL (ref 0.1–1.0)
Monocytes Relative: 5 %
Neutro Abs: 6.1 10*3/uL (ref 1.7–7.7)
Neutrophils Relative %: 84 %
Platelet Count: 132 10*3/uL — ABNORMAL LOW (ref 150–400)
RBC: 2.93 MIL/uL — ABNORMAL LOW (ref 3.87–5.11)
RDW: 17.1 % — ABNORMAL HIGH (ref 11.5–15.5)
WBC Count: 7.2 10*3/uL (ref 4.0–10.5)
nRBC: 0 % (ref 0.0–0.2)

## 2021-09-10 LAB — CMP (CANCER CENTER ONLY)
ALT: 38 U/L (ref 0–44)
AST: 29 U/L (ref 15–41)
Albumin: 4.2 g/dL (ref 3.5–5.0)
Alkaline Phosphatase: 94 U/L (ref 38–126)
Anion gap: 5 (ref 5–15)
BUN: 23 mg/dL — ABNORMAL HIGH (ref 6–20)
CO2: 26 mmol/L (ref 22–32)
Calcium: 10 mg/dL (ref 8.9–10.3)
Chloride: 105 mmol/L (ref 98–111)
Creatinine: 1.04 mg/dL — ABNORMAL HIGH (ref 0.44–1.00)
GFR, Estimated: >60 mL/min (ref >60)
Glucose, Bld: 117 mg/dL — ABNORMAL HIGH (ref 70–99)
Potassium: 4.1 mmol/L (ref 3.5–5.1)
Sodium: 136 mmol/L (ref 135–145)
Total Bilirubin: 0.4 mg/dL (ref 0.3–1.2)
Total Protein: 7.3 g/dL (ref 6.5–8.1)

## 2021-09-10 MED ORDER — DIPHENHYDRAMINE HCL 25 MG PO CAPS
25.0000 mg | ORAL_CAPSULE | Freq: Once | ORAL | Status: AC
  Administered 2021-09-10: 25 mg via ORAL
  Filled 2021-09-10: qty 1

## 2021-09-10 MED ORDER — SODIUM CHLORIDE 0.9 % IV SOLN: Freq: Once | INTRAVENOUS | Status: AC

## 2021-09-10 MED ORDER — SODIUM CHLORIDE 0.9 % IV SOLN
600.0000 mg | Freq: Once | INTRAVENOUS | Status: AC
  Administered 2021-09-10: 600 mg via INTRAVENOUS
  Filled 2021-09-10: qty 60

## 2021-09-10 MED ORDER — TRASTUZUMAB-DKST CHEMO 150 MG IV SOLR
6.0000 mg/kg | Freq: Once | INTRAVENOUS | Status: AC
  Administered 2021-09-10: 483 mg via INTRAVENOUS
  Filled 2021-09-10: qty 23

## 2021-09-10 MED ORDER — SODIUM CHLORIDE 0.9 % IV SOLN
420.0000 mg | Freq: Once | INTRAVENOUS | Status: AC
  Administered 2021-09-10: 420 mg via INTRAVENOUS
  Filled 2021-09-10: qty 14

## 2021-09-10 MED ORDER — SODIUM CHLORIDE 0.9% FLUSH
10.0000 mL | INTRAVENOUS | Status: DC | PRN
  Administered 2021-09-10: 10 mL

## 2021-09-10 MED ORDER — SODIUM CHLORIDE 0.9 % IV SOLN
150.0000 mg | Freq: Once | INTRAVENOUS | Status: AC
  Administered 2021-09-10: 150 mg via INTRAVENOUS
  Filled 2021-09-10: qty 150

## 2021-09-10 MED ORDER — SODIUM CHLORIDE 0.9 % IV SOLN
75.0000 mg/m2 | Freq: Once | INTRAVENOUS | Status: AC
  Administered 2021-09-10: 140 mg via INTRAVENOUS
  Filled 2021-09-10: qty 14

## 2021-09-10 MED ORDER — PALONOSETRON HCL INJECTION 0.25 MG/5ML
0.2500 mg | Freq: Once | INTRAVENOUS | Status: AC
  Administered 2021-09-10: 0.25 mg via INTRAVENOUS
  Filled 2021-09-10: qty 5

## 2021-09-10 MED ORDER — ACETAMINOPHEN 325 MG PO TABS
650.0000 mg | ORAL_TABLET | Freq: Once | ORAL | Status: AC
  Administered 2021-09-10: 650 mg via ORAL
  Filled 2021-09-10: qty 2

## 2021-09-10 MED ORDER — HEPARIN SOD (PORK) LOCK FLUSH 100 UNIT/ML IV SOLN
500.0000 [IU] | Freq: Once | INTRAVENOUS | Status: AC | PRN
  Administered 2021-09-10: 500 [IU]

## 2021-09-10 MED ORDER — SODIUM CHLORIDE 0.9 % IV SOLN
10.0000 mg | Freq: Once | INTRAVENOUS | Status: AC
  Administered 2021-09-10: 10 mg via INTRAVENOUS
  Filled 2021-09-10: qty 10

## 2021-09-10 NOTE — Patient Instructions (Signed)
[facility]ICAL [facility]COLOG[facility]  Discharge Instructions: Thank you for choosing [facility] to provide your oncology and hematology care.   If you have a lab appointment with the Cancer Center, please go directly to the Cancer Center and check in at the registration area.   Wear comfortable clothing and clothing appropriate for easy access to any Portacath or PICC line.   We strive to give you quality time with your provider. You may need to reschedule your appointment if you arrive late (15 or more minutes).  Arriving late affects you and other patients whose appointments are after yours.  Also, if you miss three or more appointments without notifying the office, you may be dismissed from the clinic at the provider's discretion.      For prescription refill requests, have your pharmacy contact our office and allow 72 hours for refills to be completed.    Today you received the following chemotherapy and/or immunotherapy agents: trastuzumab-dkst, pertuzumab, docetaxel, and carboplatin      To help prevent nausea and vomiting after your treatment, we encourage you to take your nausea medication as directed.  BELOW ARE SYMPTOMS THAT SHOULD BE REPORTED IMMEDIATELY: *FEVER GREATER THAN 100.4 F (38 C) OR HIGHER *CHILLS OR SWEATING *NAUSEA AND VOMITING THAT IS NOT CONTROLLED WITH YOUR NAUSEA MEDICATION *UNUSUAL SHORTNESS OF BREATH *UNUSUAL BRUISING OR BLEEDING *URINARY PROBLEMS (pain or burning when urinating, or frequent urination) *BOWEL PROBLEMS (unusual diarrhea, constipation, pain near the anus) TENDERNESS IN MOUTH AND THROAT WITH OR WITHOUT PRESENCE OF ULCERS (sore throat, sores in mouth, or a toothache) UNUSUAL RASH, SWELLING OR PAIN  UNUSUAL VAGINAL DISCHARGE OR ITCHING   Items with * indicate a potential emergency and should be followed up as soon as possible or go to the Emergency Department if any problems should occur.  Please show the CHEMOTHERAPY ALERT  CARD or IMMUNOTHERAPY ALERT CARD at check-in to the Emergency Department and triage nurse.  Should you have questions after your visit or need to cancel or reschedule your appointment, please contact [facility]  Dept: [phone]  and follow the prompts.  Office hours are 8:00 a.m. to 4:30 p.m. Monday - Friday. Please note that voicemails left after 4:00 p.m. may not be returned until the following business day.  We are closed weekends and major holidays. You have access to a nurse at all times for urgent questions. Please call the main number to the clinic Dept: [phone] and follow the prompts.   For any non-urgent questions, you may also contact your provider using MyChart. We now offer e-Visits for anyone 18 and older to request care online for non-urgent symptoms. For details visit [email or web address].   Also download the MyChart app! Go to the app store, search "MyChart", open the app, select [facility], and log in with your MyChart username and password.  Masks are optional in the cancer centers. If you would like for your care team to wear a mask while they are taking care of you, please let them know. You may have one support person who is at least 54 years old accompany you for your appointments. 

## 2021-09-10 NOTE — Progress Notes (Signed)
Per Dr Lindi Adie, ok to treat today with elevated HR and current ECHO. Patient is scheduled for next ECHO on 09/16/2021.

## 2021-09-10 NOTE — Assessment & Plan Note (Signed)
06/14/2018: Right lumpectomy: Intraductal papilloma with usual ductal hyperplasia, 1 benign lymph node (decided against tamoxifen) 05/19/2021:Screening mammogram detected right breast density. 2 lymph nodes were noted biopsy appointment was benign. Ultrasound revealed 2.2 cm mass at 11 o'clock position right breast biopsy: Grade 3 IDC with necrosis, ER 0%, PR 0%, Ki-67 60%, HER2 3+ positive by IHC  Treatment Plan: 1. Neoadjuvant chemotherapy with TCH Perjeta 6 cycles followed by Herceptin Perjeta maintenance versus Kadcyla maintenance (based on response to neoadjuvant chemo) for 1 year 2. Followed by breast conserving surgery with sentinel lymph node study 3. Followed by adjuvant radiation therapy  MRI Breast 06/08/21: Rt Breast cancer 2.8 cm, additional NME total 6.5 cm, Left breast 0.6 cm retroareolar mass, additional indeterminate NME 3.5 cm (Need MRI guided biopsies Rt Br and Left Breast) CT CAP 06/10/21: Breast masses and Bil Axillary LN, 4 mm Rt LL nodule, tiny hepatic densities 4 mm too small, nodular area inferior bladder (wall thickening vs ext effacement) Bone scan 06/10/21: Benign --------------------------------------------------------------------------------------------------------------- Current Treatment:Cycle5TCHP ECHO EF 60-65%  Chemo Toxicities: 1.Severe pelvic bone pain:Mild 2.diarrhea: 3-4 loose stools per day: Unclear as her to take Imodium. 3.Fatigue: Profound lasting for a week 4.Chemotherapy-induced anemia: Hemoglobin is 9.3 5.Dark line on her forehead: Unclear etiology 6.  Diarrhea and leg cramps: We will administer IV fluids on Tuesday after each cycle.   7.  Stomach disturbance: I encouraged her to take Zofran every morning from day 3 today 6 after chemo She denied any nausea or vomiting.  RTC in3weeks for cycle 6

## 2021-09-11 ENCOUNTER — Other Ambulatory Visit: Payer: Self-pay

## 2021-09-12 ENCOUNTER — Inpatient Hospital Stay: Payer: Federal, State, Local not specified - PPO

## 2021-09-12 VITALS — BP 131/90 | HR 93 | Temp 98.1°F | Resp 17 | Ht 66.0 in

## 2021-09-12 DIAGNOSIS — Z5189 Encounter for other specified aftercare: Secondary | ICD-10-CM | POA: Diagnosis not present

## 2021-09-12 DIAGNOSIS — Z5111 Encounter for antineoplastic chemotherapy: Secondary | ICD-10-CM | POA: Diagnosis not present

## 2021-09-12 DIAGNOSIS — R200 Anesthesia of skin: Secondary | ICD-10-CM | POA: Diagnosis not present

## 2021-09-12 DIAGNOSIS — C50411 Malignant neoplasm of upper-outer quadrant of right female breast: Secondary | ICD-10-CM

## 2021-09-12 DIAGNOSIS — T451X5A Adverse effect of antineoplastic and immunosuppressive drugs, initial encounter: Secondary | ICD-10-CM | POA: Diagnosis not present

## 2021-09-12 DIAGNOSIS — K5900 Constipation, unspecified: Secondary | ICD-10-CM | POA: Diagnosis not present

## 2021-09-12 DIAGNOSIS — Z171 Estrogen receptor negative status [ER-]: Secondary | ICD-10-CM | POA: Diagnosis not present

## 2021-09-12 DIAGNOSIS — R197 Diarrhea, unspecified: Secondary | ICD-10-CM | POA: Diagnosis not present

## 2021-09-12 DIAGNOSIS — E860 Dehydration: Secondary | ICD-10-CM | POA: Diagnosis not present

## 2021-09-12 DIAGNOSIS — R5383 Other fatigue: Secondary | ICD-10-CM | POA: Diagnosis not present

## 2021-09-12 DIAGNOSIS — N6331 Unspecified lump in axillary tail of the right breast: Secondary | ICD-10-CM | POA: Diagnosis not present

## 2021-09-12 DIAGNOSIS — Z5112 Encounter for antineoplastic immunotherapy: Secondary | ICD-10-CM | POA: Diagnosis not present

## 2021-09-12 DIAGNOSIS — D6481 Anemia due to antineoplastic chemotherapy: Secondary | ICD-10-CM | POA: Diagnosis not present

## 2021-09-12 MED ORDER — PEGFILGRASTIM-CBQV 6 MG/0.6ML ~~LOC~~ SOSY
6.0000 mg | PREFILLED_SYRINGE | Freq: Once | SUBCUTANEOUS | Status: AC
  Administered 2021-09-12: 6 mg via SUBCUTANEOUS

## 2021-09-16 ENCOUNTER — Telehealth: Payer: Self-pay | Admitting: *Deleted

## 2021-09-16 ENCOUNTER — Ambulatory Visit (HOSPITAL_COMMUNITY)
Admission: RE | Admit: 2021-09-16 | Discharge: 2021-09-16 | Disposition: A | Discharge status: Home / Self Care | Payer: Federal, State, Local not specified - PPO | Source: Ambulatory Visit | Attending: Hematology and Oncology | Admitting: Hematology and Oncology

## 2021-09-16 DIAGNOSIS — Z5181 Encounter for therapeutic drug level monitoring: Secondary | ICD-10-CM | POA: Diagnosis not present

## 2021-09-16 DIAGNOSIS — C50411 Malignant neoplasm of upper-outer quadrant of right female breast: Secondary | ICD-10-CM

## 2021-09-16 DIAGNOSIS — Z79899 Other long term (current) drug therapy: Secondary | ICD-10-CM | POA: Diagnosis not present

## 2021-09-16 DIAGNOSIS — Z0189 Encounter for other specified special examinations: Secondary | ICD-10-CM

## 2021-09-16 LAB — ECHOCARDIOGRAM COMPLETE
AR max vel: 2.49 cm2
AV Peak grad: 8.4 mmHg
Ao pk vel: 1.45 m/s
Area-P 1/2: 4.8 cm2
S' Lateral: 2.1 cm

## 2021-09-16 NOTE — Telephone Encounter (Signed)
Received call from pt with complaint of weakness, decreased appetite, and shortness of breath with exertion x several days.  Per MD pt needing lab and [facility] visit for further evaluation and tx. Appt scheduled, pt verbalized understanding of appt date and time.

## 2021-09-17 ENCOUNTER — Encounter: Payer: Self-pay | Admitting: Physician Assistant

## 2021-09-17 ENCOUNTER — Inpatient Hospital Stay: Payer: Federal, State, Local not specified - PPO

## 2021-09-17 ENCOUNTER — Ambulatory Visit (HOSPITAL_COMMUNITY)
Admission: RE | Admit: 2021-09-17 | Discharge: 2021-09-17 | Disposition: A | Discharge status: Home / Self Care | Payer: Federal, State, Local not specified - PPO | Source: Ambulatory Visit | Attending: Physician Assistant | Admitting: Physician Assistant

## 2021-09-17 ENCOUNTER — Inpatient Hospital Stay (HOSPITAL_BASED_OUTPATIENT_CLINIC_OR_DEPARTMENT_OTHER): Payer: Federal, State, Local not specified - PPO | Admitting: Physician Assistant

## 2021-09-17 ENCOUNTER — Other Ambulatory Visit: Payer: Self-pay

## 2021-09-17 VITALS — BP 101/82 | HR 110 | Temp 98.7°F | Resp 17 | Ht 66.0 in | Wt 168.9 lb

## 2021-09-17 VITALS — BP 114/80 | HR 105 | Temp 98.5°F | Resp 16

## 2021-09-17 DIAGNOSIS — R0602 Shortness of breath: Secondary | ICD-10-CM

## 2021-09-17 DIAGNOSIS — Z95828 Presence of other vascular implants and grafts: Secondary | ICD-10-CM

## 2021-09-17 DIAGNOSIS — C50919 Malignant neoplasm of unspecified site of unspecified female breast: Secondary | ICD-10-CM | POA: Diagnosis not present

## 2021-09-17 DIAGNOSIS — C50411 Malignant neoplasm of upper-outer quadrant of right female breast: Secondary | ICD-10-CM | POA: Insufficient documentation

## 2021-09-17 DIAGNOSIS — R638 Other symptoms and signs concerning food and fluid intake: Secondary | ICD-10-CM | POA: Diagnosis not present

## 2021-09-17 DIAGNOSIS — Z171 Estrogen receptor negative status [ER-]: Secondary | ICD-10-CM | POA: Diagnosis not present

## 2021-09-17 DIAGNOSIS — R079 Chest pain, unspecified: Secondary | ICD-10-CM | POA: Diagnosis not present

## 2021-09-17 LAB — CBC WITH DIFFERENTIAL (CANCER CENTER ONLY)
Abs Immature Granulocytes: 0.32 10*3/uL — ABNORMAL HIGH (ref 0.00–0.07)
Basophils Absolute: 0.1 10*3/uL (ref 0.0–0.1)
Basophils Relative: 1 %
Eosinophils Absolute: 0 10*3/uL (ref 0.0–0.5)
Eosinophils Relative: 0 %
HCT: 29.3 % — ABNORMAL LOW (ref 36.0–46.0)
Hemoglobin: 9.9 g/dL — ABNORMAL LOW (ref 12.0–15.0)
Immature Granulocytes: 6 %
Lymphocytes Relative: 27 %
Lymphs Abs: 1.4 10*3/uL (ref 0.7–4.0)
MCH: 30.7 pg (ref 26.0–34.0)
MCHC: 33.8 g/dL (ref 30.0–36.0)
MCV: 91 fL (ref 80.0–100.0)
Monocytes Absolute: 1.1 10*3/uL — ABNORMAL HIGH (ref 0.1–1.0)
Monocytes Relative: 21 %
Neutro Abs: 2.3 10*3/uL (ref 1.7–7.7)
Neutrophils Relative %: 45 %
Platelet Count: 162 10*3/uL (ref 150–400)
RBC: 3.22 MIL/uL — ABNORMAL LOW (ref 3.87–5.11)
RDW: 16.5 % — ABNORMAL HIGH (ref 11.5–15.5)
WBC Count: 5.1 10*3/uL (ref 4.0–10.5)
nRBC: 0 % (ref 0.0–0.2)

## 2021-09-17 LAB — CMP (CANCER CENTER ONLY)
ALT: 28 U/L (ref 0–44)
AST: 21 U/L (ref 15–41)
Albumin: 4.3 g/dL (ref 3.5–5.0)
Alkaline Phosphatase: 106 U/L (ref 38–126)
Anion gap: 9 (ref 5–15)
BUN: 23 mg/dL — ABNORMAL HIGH (ref 6–20)
CO2: 28 mmol/L (ref 22–32)
Calcium: 9.9 mg/dL (ref 8.9–10.3)
Chloride: 99 mmol/L (ref 98–111)
Creatinine: 1.2 mg/dL — ABNORMAL HIGH (ref 0.44–1.00)
GFR, Estimated: 54 mL/min — ABNORMAL LOW (ref >60)
Glucose, Bld: 111 mg/dL — ABNORMAL HIGH (ref 70–99)
Potassium: 3.7 mmol/L (ref 3.5–5.1)
Sodium: 136 mmol/L (ref 135–145)
Total Bilirubin: 0.6 mg/dL (ref 0.3–1.2)
Total Protein: 7.7 g/dL (ref 6.5–8.1)

## 2021-09-17 MED ORDER — IOHEXOL 350 MG/ML SOLN
75.0000 mL | Freq: Once | INTRAVENOUS | Status: AC | PRN
  Administered 2021-09-17: 75 mL via INTRAVENOUS

## 2021-09-17 MED ORDER — HEPARIN SOD (PORK) LOCK FLUSH 100 UNIT/ML IV SOLN
500.0000 [IU] | Freq: Once | INTRAVENOUS | Status: AC
  Administered 2021-09-17: 500 [IU] via INTRAVENOUS

## 2021-09-17 MED ORDER — SODIUM CHLORIDE 0.9 % IV SOLN: INTRAVENOUS | Status: DC

## 2021-09-17 MED ORDER — SODIUM CHLORIDE (PF) 0.9 % IJ SOLN
INTRAMUSCULAR | Status: AC
  Filled 2021-09-17: qty 50

## 2021-09-17 MED ORDER — HEPARIN SOD (PORK) LOCK FLUSH 100 UNIT/ML IV SOLN
INTRAVENOUS | Status: AC
  Filled 2021-09-17: qty 5

## 2021-09-17 MED ORDER — SODIUM CHLORIDE 0.9% FLUSH
10.0000 mL | Freq: Once | INTRAVENOUS | Status: AC
  Administered 2021-09-17: 10 mL

## 2021-09-17 MED ORDER — HEPARIN SOD (PORK) LOCK FLUSH 100 UNIT/ML IV SOLN
500.0000 [IU] | Freq: Once | INTRAVENOUS | Status: AC
  Administered 2021-09-17: 500 [IU]

## 2021-09-17 NOTE — Progress Notes (Signed)
Symptom Management Consult note [place]    Patient Care Team: Patricia Huxley, PA as PCP - General (Family Medicine) Patricia Kaufmann, RN as Oncology Nurse Navigator Patricia Germany, RN as Oncology Nurse Navigator Patricia Luna, MD as Consulting Physician (General Surgery) Patricia Lose, MD as Consulting Physician (Hematology and Oncology) Patricia Gibson, MD as Attending Physician (Radiation Oncology)    Name of the patient: Patricia Houston  [id number]  [birth year]   Date of visit: 09/17/2021   Chief Complaint/Reason for visit: constipation, shortness of breath   Current Therapy: TCHP  Last treatment:  Day 1   Cycle 5 on 09/10/21   ASSESSMENT & PLAN: Patient is a 56 y.o. female  with oncologic history of Malignant neoplasm of upper-outer quadrant of right breast, estrogen receptor negative followed by Dr. Lindi Houston.  I have viewed most recent oncology note and lab work.    #) Malignant neoplasm of upper-outer quadrant of right breast, estrogen receptor negative -Currently on [facility] - Next appointment with oncologist is 10/01/21   #)Shortness of breath and tachycardia -Patient is afebrile, normotensive with tachycardia to 110 on clinic arrival.  No hypoxia. -Lung exam is clear to auscultation and she has normal work of breathing. -Patient symptoms are concerning for PE with acute exertional SOB and tachycardia. Patient had CTA this afternoon that was negative for PE or any signs of infection. I viewed image and agree with radiologist.  #) Anemia -Likely secondary to chemotherapy.  -CBC today shows stable hemoglobin 9.9, no indications for transfusion  #)Dehydration -Decreased PO intake x 1 week. Clinically appears dehydrated on exam. -Slight bump in creatinine today 1.2 with baseline around 0.9 per chart review. -Patient given IVF and encouraged to increase fluid intake at home.  #)Constipation -Non tender abdomen with normoactive bowel sounds.  Patient passing flatus. -Discussed at length OTC management including miralax, colace and 1/2 bottle of magnesium citrate if still no BM with the first 2 treatments.  - If no BM or she develops abdominal pain would consider adding abdominal imaging to r/o obstruction. - Strict ED precautions discussed should symptoms worsen.     Heme/Onc History: Oncology History  Malignant neoplasm of upper-outer quadrant of right breast in female, estrogen receptor negative ([place])  06/14/2018 Surgery   06/14/2018: Right lumpectomy: Intraductal papilloma with usual ductal hyperplasia, 1 benign lymph node (decided against tamoxifen)   05/19/2021 Relapse/Recurrence   Screening mammogram detected right breast density.  2 lymph nodes were noted biopsy appointment was benign.  Ultrasound revealed 2.2 cm mass at 11 o'clock position right breast biopsy: Grade 3 IDC with necrosis, ER 0%, PR 0%, Ki-67 60%, HER2 3+ positive by [facility]   05/27/2021 Cancer Staging   Staging form: Breast, AJCC 8th Edition - Clinical: Stage IIA (cT2, cN0, cM0, G3, ER-, PR-, HER2+) - Signed by Patricia Lose, MD on 05/27/2021 Stage prefix: Initial diagnosis Histologic grading system: 3 grade system   06/18/2021 - 08/22/2021 Chemotherapy   Patient is on Treatment Plan : BREAST  Docetaxel + Carboplatin + Trastuzumab + Pertuzumab  (TCHP) q21d      06/18/2021 -  Chemotherapy   Patient is on Treatment Plan : BREAST  Docetaxel + Carboplatin + Trastuzumab + Pertuzumab  (TCHP) q21d / Trastuzumab + Pertuzumab q21d         Interval history-: Patricia Houston is a 56 y.o. female with oncologic history as above presenting to [facility] today with chief complaint of constipation and shortness of breath. Daughter  accompanies her to clinic today and gives additional history.  Patient reports her last bowel movement was 4 days ago. She has been passing flatus and belching. She admits to decreased PO intake since her last treatment because she has been feeling  poorly. She did have an episode of nausea and NBNB emesis overnight. She has not tried any OTC medications prior to arrival. She denies history of constipation and notes it started slowly developing during chemotherapy she thinks. She denies any abdominal pain, fever, urinary symptoms, back pain.  Patient reports progressively worsening shortness of breath x 2 days. She first noticed it when walking to the bathroom which is new for her. She reports today she is short of breath with position changes. She denies any associated chest pain. She denies history of PE, syncope, hemoptysis, leg swelling. Chart review shows patient had routine echo yesterday that showed EF 60-65%.      ROS  All other systems are reviewed and are negative for acute change except as noted in the HPI.    No Known Allergies   Past Medical History:  Diagnosis Date   Anemia    TAKES IRON PILLS   Anxiety    Cancer ([place])    right breast DCIS   Depression      Past Surgical History:  Procedure Laterality Date   ABDOMINAL HYSTERECTOMY     BREAST LUMPECTOMY WITH RADIOACTIVE SEED LOCALIZATION Right 06/14/2018   Procedure: RIGHT BREAST RADIOACTIVE SEED LOCALIZATION LUMPECTOMY X2;  Surgeon: Patricia Luna, MD;  Location: [facility];  Service: General;  Laterality: Right;   IR IMAGING GUIDED PORT INSERTION  06/17/2021   LAPAROSCOPIC ASSISTED VAGINAL HYSTERECTOMY N/A 02/27/2014   Procedure: LAPAROSCOPIC ASSISTED VAGINAL HYSTERECTOMY;  Surgeon: Maeola Sarah. Landry Mellow, MD;  Location: [facility];  Service: Gynecology;  Laterality: N/A;  abdomen to vagina to abdomen   TONSILLECTOMY      Social History   Socioeconomic History   Marital status: Married    Spouse name: Not on file   Number of children: Not on file   Years of education: Not on file   Highest education level: Not on file  Occupational History   Not on file  Tobacco Use   Smoking status: Never   Smokeless tobacco: Never  Vaping Use   Vaping Use:  Never used  Substance and Sexual Activity   Alcohol use: Yes    Comment: rare - 1-2 drinks every few months   Drug use: No   Sexual activity: Yes    Birth control/protection: Surgical  Other Topics Concern   Not on file  Social History Narrative   Not on file   Social Determinants of Health   Financial Resource Strain: Not on file  Food Insecurity: Not on file  Transportation Needs: No Transportation Needs (06/20/2018)   PRAPARE - Hydrologist (Medical): No    Lack of Transportation (Non-Medical): No  Physical Activity: Not on file  Stress: Not on file  Social Connections: Not on file  Intimate Partner Violence: Not At Risk (06/20/2018)   Humiliation, Afraid, Rape, and Kick questionnaire    Fear of Current or Ex-Partner: No    Emotionally Abused: No    Physically Abused: No    Sexually Abused: No    Family History  Problem Relation Age of Onset   Breast cancer Mother        dx. late 39s; s/p mastectomy   Pancreatic cancer Mother 34  adenocarcinoma   Other Sister        one sister had a hysterectomy due to heavy bleeding at the age of 24   Heart attack Maternal Uncle 41   Heart attack Maternal Grandmother 83   Prostate cancer Maternal Grandfather 86       "very aggressive"   Leukemia Other 27       maternal great aunt (MGM's sister)   Stomach cancer Other        maternal great aunt (MGF's sister) dx. over age 74   Heart Problems Paternal Grandmother    Cancer Other        maternal great uncle (MGM's brother) dx. NOS cancer at later age   Breast cancer Cousin        (2) maternal "2nd cousins" dx. with breast cancers in their mid-50s   Colon cancer Neg Hx      Current Outpatient Medications:    Cetirizine HCl (ZYRTEC PO), Take by mouth., Disp: , Rfl:    cholecalciferol (VITAMIN D3) 25 MCG (1000 UNIT) tablet, Take 1 tablet (1,000 Units total) by mouth daily., Disp: , Rfl:    Cyanocobalamin (VITAMIN B-12) 3000 MCG SUBL, Place 1  tablet under the tongue daily., Disp: , Rfl:    Fe Cbn-Fe Gluc-FA-B12-C-DSS (FERRALET 90) 90-1 MG TABS, Take 1 tablet by mouth daily., Disp: , Rfl:    ferrous sulfate 325 (65 FE) MG tablet, , Disp: , Rfl:    ibuprofen (ADVIL) 200 MG tablet, Take 200 mg by mouth as needed for moderate pain., Disp: , Rfl:    loratadine (CLARITIN) 10 MG tablet, 1 tablet, Disp: , Rfl:  No current facility-administered medications for this visit.  Facility-Administered Medications Ordered in Other Visits:    heparin lock flush 100 UNIT/ML injection, , , ,    sodium chloride (PF) 0.9 % injection, , , ,   PHYSICAL EXAM: ECOG FS:1 - Symptomatic but completely ambulatory    Vitals:   09/17/21 1009  BP: 101/82  Pulse: (!) 110  Resp: 17  Temp: 98.7 F (37.1 C)  TempSrc: Temporal  SpO2: 97%  Weight: 168 lb 14.4 oz (76.6 kg)  Height: '5\' 6"'  (1.676 m)   Physical Exam Vitals and nursing note reviewed.  Constitutional:      Appearance: She is well-developed. She is not ill-appearing or toxic-appearing.  HENT:     Head: Normocephalic.     Nose: Nose normal.  Eyes:     Conjunctiva/sclera: Conjunctivae normal.  Neck:     Vascular: No JVD.  Cardiovascular:     Rate and Rhythm: Regular rhythm. Tachycardia present.     Pulses: Normal pulses.     Heart sounds: Normal heart sounds.  Pulmonary:     Effort: Pulmonary effort is normal.     Breath sounds: Normal breath sounds.  Abdominal:     General: Bowel sounds are normal. There is no distension.     Palpations: Abdomen is soft. There is no mass.     Tenderness: There is no abdominal tenderness. There is no guarding or rebound.     Hernia: No hernia is present.  Musculoskeletal:     Cervical back: Normal range of motion.     Right lower leg: No edema.     Left lower leg: No edema.  Skin:    General: Skin is warm and dry.  Neurological:     Mental Status: She is oriented to person, place, and time.  LABORATORY DATA: I have reviewed the data  as listed    Latest Ref Rng & Units 09/17/2021    9:57 AM 09/10/2021    8:22 AM 08/20/2021    9:15 AM  CBC  WBC 4.0 - 10.5 K/uL 5.1  7.2  9.2   Hemoglobin 12.0 - 15.0 g/dL 9.9  9.0  9.3   Hematocrit 36.0 - 46.0 % 29.3  26.6  27.6   Platelets 150 - 400 K/uL 162  132  186         Latest Ref Rng & Units 09/17/2021    9:57 AM 09/10/2021    8:22 AM 08/20/2021    9:15 AM  CMP  Glucose 70 - 99 mg/dL 111  117  126   BUN 6 - 20 mg/dL '23  23  20   ' Creatinine 0.44 - 1.00 mg/dL 1.20  1.04  1.07   Sodium 135 - 145 mmol/L 136  136  137   Potassium 3.5 - 5.1 mmol/L 3.7  4.1  3.9   Chloride 98 - 111 mmol/L 99  105  104   CO2 22 - 32 mmol/L '28  26  27   ' Calcium 8.9 - 10.3 mg/dL 9.9  10.0  10.2   Total Protein 6.5 - 8.1 g/dL 7.7  7.3  7.2   Total Bilirubin 0.3 - 1.2 mg/dL 0.6  0.4  0.4   Alkaline Phos 38 - 126 U/L 106  94  88   AST 15 - 41 U/L '21  29  25   ' ALT 0 - 44 U/L 28  38  37        RADIOGRAPHIC STUDIES (from last 24 hours if applicable) I have personally reviewed the radiological images as listed and agreed with the findings in the report. CT Angio Chest Pulmonary Embolism (PE) W or WO Contrast  Result Date: 09/17/2021 CLINICAL DATA:  Pulmonary embolism (PE) suspected, unknown D-dimer. Breast cancer, chest pain, shortness of breath, tachycardia. EXAM: CT ANGIOGRAPHY CHEST WITH CONTRAST TECHNIQUE: Multidetector CT imaging of the chest was performed using the standard protocol during bolus administration of intravenous contrast. Multiplanar CT image reconstructions and MIPs were obtained to evaluate the vascular anatomy. RADIATION DOSE REDUCTION: This exam was performed according to the departmental dose-optimization program which includes automated exposure control, adjustment of the mA and/or kV according to patient size and/or use of iterative reconstruction technique. CONTRAST:  31m OMNIPAQUE IOHEXOL 350 MG/ML SOLN COMPARISON:  CT chest, abdomen, and pelvis 06/09/2021 FINDINGS:  Cardiovascular: Pulmonary arterial opacification is adequate without evidence of emboli. The thoracic aorta is normal in caliber. The heart is normal in size. A left jugular Port-A-Cath terminates in the right atrium. There is no pericardial effusion. Mediastinum/Nodes: Postoperative changes in the right breast and right axilla. Marker clips in the left breast. Small residual right axillary lymph nodes all measuring less than 1 cm in short axis except for a 1.3 cm short axis node with prominent fatty hilum which is benign in appearance. Similar appearance of subcentimeter short axis left axillary lymph nodes compared to the prior CT. No enlarged mediastinal or hilar lymph nodes. Unremarkable thyroid and esophagus. Lungs/Pleura: No pleural effusion or pneumothorax. No consolidation. Unchanged scarring in the lingula. Unchanged 3 mm right lower lobe nodule (series 8, image 57). Upper Abdomen: Scattered subcentimeter hypodensities in the liver, similar to the prior CT and too small to fully characterize. Musculoskeletal: No acute osseous abnormality or suspicious osseous lesion. Review of the MIP images confirms the above  findings. IMPRESSION: 1. No evidence of pulmonary emboli or other acute abnormality in the chest. 2. Unchanged 3 mm right lower lobe pulmonary nodule. Electronically Signed   By: Logan Bores M.D.   On: 09/17/2021 15:11        Visit Diagnosis: 1. Malignant neoplasm of upper-outer quadrant of right breast in female, estrogen receptor negative ([place])   2. Port-A-Cath in place   3. Poor fluid intake   4. Shortness of breath      Orders Placed This Encounter  Procedures   CT Angio Chest Pulmonary Embolism (PE) W or WO Contrast    Standing Status:   Future    Number of Occurrences:   1    Standing Expiration Date:   09/18/2022    Order Specific Question:   If indicated for the ordered procedure, I authorize the administration of contrast media per Radiology protocol    Answer:   Yes     Order Specific Question:   Is patient pregnant?    Answer:   No    Order Specific Question:   Preferred imaging location?    Answer:   [facility]    All questions were answered. The patient knows to call the clinic with any problems, questions or concerns. No barriers to learning was detected.  I have spent a total of 30 minutes minutes of face-to-face and non-face-to-face time, preparing to see the patient, obtaining and/or reviewing separately obtained history, performing a medically appropriate examination, counseling and educating the patient, ordering tests, documenting clinical information in the electronic health record, and care coordination (communications with other health care professionals or caregivers).    Thank you for allowing me to participate in the care of this patient.    Barrie Folk, PA-C Department of Hematology/Oncology [facility] at [facility] Phone: [phone]  [phone]    09/17/2021 4:41 PM

## 2021-09-18 ENCOUNTER — Other Ambulatory Visit: Payer: Self-pay

## 2021-09-18 ENCOUNTER — Telehealth: Payer: Self-pay

## 2021-09-18 DIAGNOSIS — Z171 Estrogen receptor negative status [ER-]: Secondary | ICD-10-CM

## 2021-09-18 NOTE — Telephone Encounter (Signed)
Pt called and would like to cancel infusion appt for 9/16. She states she is feeling better and does not feel appt is necessary. She will call us if she needs anything.

## 2021-09-19 ENCOUNTER — Inpatient Hospital Stay: Payer: Federal, State, Local not specified - PPO

## 2021-09-22 ENCOUNTER — Other Ambulatory Visit: Payer: Self-pay

## 2021-09-25 ENCOUNTER — Other Ambulatory Visit: Payer: Self-pay

## 2021-09-28 NOTE — Progress Notes (Signed)
Patient Care Team: Wilfrid Lund, PA as PCP - General (Family Medicine) Pershing Proud, RN as Oncology Nurse Navigator Donnelly Angelica, RN as Oncology Nurse Navigator Harriette Bouillon, MD as Consulting Physician (General Surgery) Serena Croissant, MD as Consulting Physician (Hematology and Oncology) Lonie Peak, MD as Attending Physician (Radiation Oncology)  DIAGNOSIS:  Encounter Diagnosis  Name Primary?   Malignant neoplasm of upper-outer quadrant of right breast in female, estrogen receptor negative (HCC)     SUMMARY OF ONCOLOGIC HISTORY: Oncology History  Malignant neoplasm of upper-outer quadrant of right breast in female, estrogen receptor negative (HCC)  06/14/2018 Surgery   06/14/2018: Right lumpectomy: Intraductal papilloma with usual ductal hyperplasia, 1 benign lymph node (decided against tamoxifen)   05/19/2021 Relapse/Recurrence   Screening mammogram detected right breast density.  2 lymph nodes were noted biopsy appointment was benign.  Ultrasound revealed 2.2 cm mass at 11 o'clock position right breast biopsy: Grade 3 IDC with necrosis, ER 0%, PR 0%, Ki-67 60%, HER2 3+ positive by [facility]   05/27/2021 Cancer Staging   Staging form: Breast, AJCC 8th Edition - Clinical: Stage IIA (cT2, cN0, cM0, G3, ER-, PR-, HER2+) - Signed by Serena Croissant, MD on 05/27/2021 Stage prefix: Initial diagnosis Histologic grading system: 3 grade system   06/18/2021 - 08/22/2021 Chemotherapy   Patient is on Treatment Plan : BREAST  Docetaxel + Carboplatin + Trastuzumab + Pertuzumab  (TCHP) q21d      06/18/2021 -  Chemotherapy   Patient is on Treatment Plan : BREAST  Docetaxel + Carboplatin + Trastuzumab + Pertuzumab  (TCHP) q21d / Trastuzumab + Pertuzumab q21d       CHIEF COMPLIANT: Follow-up cycle 6 TCHP   INTERVAL HISTORY: Patricia Houston is a 56 y.o. female is here because of recent diagnosis of right breast cancer. She presents to the clinic today for a follow-up and treatment. She  reports the nausea and diarrhea was about the same. She couldn't walk 20 steps without having to sit down. Her heart rate stayed  high for about 2 days then it subsided. She states the shortness of breath was her main concern.   ALLERGIES:  has No Known Allergies.  MEDICATIONS:  Current Outpatient Medications  Medication Sig Dispense Refill   Cetirizine HCl (ZYRTEC PO) Take by mouth.     cholecalciferol (VITAMIN D3) 25 MCG (1000 UNIT) tablet Take 1 tablet (1,000 Units total) by mouth daily.     Cyanocobalamin (VITAMIN B-12) 3000 MCG SUBL Place 1 tablet under the tongue daily.     Fe Cbn-Fe Gluc-FA-B12-C-DSS (FERRALET 90) 90-1 MG TABS Take 1 tablet by mouth daily.     ferrous sulfate 325 (65 FE) MG tablet      ibuprofen (ADVIL) 200 MG tablet Take 200 mg by mouth as needed for moderate pain.     loratadine (CLARITIN) 10 MG tablet 1 tablet     No current facility-administered medications for this visit.    PHYSICAL EXAMINATION: ECOG PERFORMANCE STATUS: 1 - Symptomatic but completely ambulatory  Vitals:   10/01/21 0922  BP: 125/89  Pulse: (!) 109  Resp: 18  Temp: (!) 97.3 F (36.3 C)  SpO2: 98%   Filed Weights   10/01/21 0922  Weight: 177 lb 6.4 oz (80.5 kg)      LABORATORY DATA:  I have reviewed the data as listed    Latest Ref Rng & Units 09/17/2021    9:57 AM 09/10/2021    8:22 AM 08/20/2021    9:15  AM  CMP  Glucose 70 - 99 mg/dL 161  096  045   BUN 6 - 20 mg/dL 23  23  20    Creatinine 0.44 - 1.00 mg/dL 4.09  8.11  9.14   Sodium 135 - 145 mmol/L 136  136  137   Potassium 3.5 - 5.1 mmol/L 3.7  4.1  3.9   Chloride 98 - 111 mmol/L 99  105  104   CO2 22 - 32 mmol/L 28  26  27    Calcium 8.9 - 10.3 mg/dL 9.9  78.2  95.6   Total Protein 6.5 - 8.1 g/dL 7.7  7.3  7.2   Total Bilirubin 0.3 - 1.2 mg/dL 0.6  0.4  0.4   Alkaline Phos 38 - 126 U/L 106  94  88   AST 15 - 41 U/L 21  29  25    ALT 0 - 44 U/L 28  38  37     Lab Results  Component Value Date   WBC 5.8 10/01/2021    HGB 8.7 (L) 10/01/2021   HCT 26.4 (L) 10/01/2021   MCV 95.7 10/01/2021   PLT 132 (L) 10/01/2021   NEUTROABS 5.1 10/01/2021    ASSESSMENT & PLAN:  Malignant neoplasm of upper-outer quadrant of right breast in female, estrogen receptor negative (HCC) 06/14/2018: Right lumpectomy: Intraductal papilloma with usual ductal hyperplasia, 1 benign lymph node (decided against tamoxifen) 05/19/2021: Screening mammogram detected right breast density.  2 lymph nodes were noted biopsy appointment was benign.  Ultrasound revealed 2.2 cm mass at 11 o'clock position right breast biopsy: Grade 3 IDC with necrosis, ER 0%, PR 0%, Ki-67 60%, HER2 3+ positive by IHC   Treatment Plan: 1. Neoadjuvant chemotherapy with TCH Perjeta 6 cycles followed by Herceptin Perjeta maintenance versus Kadcyla maintenance (based on response to neoadjuvant chemo) for 1 year 2. Followed by breast conserving surgery  with sentinel lymph node study 3. Followed by adjuvant radiation therapy   MRI Breast 06/08/21:  Rt Breast cancer 2.8 cm, additional NME total 6.5 cm, Left breast 0.6 cm retroareolar mass, additional indeterminate NME 3.5 cm (Need MRI guided biopsies Rt Br and Left Breast) CT CAP 06/10/21: Breast masses and Bil Axillary LN, 4 mm Rt LL nodule, tiny hepatic densities 4 mm too small, nodular area inferior bladder (wall thickening vs ext effacement) Bone scan 06/10/21: Benign --------------------------------------------------------------------------------------------------------------- Current Treatment: Cycle 6 TCHP ECHO EF 60-65%   Chemo Toxicities: 1.  Severe pelvic bone pain: Mild 2. diarrhea: Alternating with constipation. 3.  Fatigue: Profound lasting for a week 4.  Chemotherapy-induced anemia: Hemoglobin is 9.  If it drops significantly below we may have to lower the dosage of the last cycle 5.  Dark line on her forehead: Unclear etiology 6.  Diarrhea and leg cramps: We will administer IV fluids on Tuesday after each  cycle.   7.  Stomach disturbance: Prophylactic Zofran.   She has an appointment for breast MRI and follow-up with surgery. Return to clinic every 3 weeks for Herceptin Perjeta. After final surgery can decide if she can continue the Herceptin Perjeta or we need to switch to Kadcyla.    No orders of the defined types were placed in this encounter.  The patient has a good understanding of the overall plan. she agrees with it. she will call with any problems that may develop before the next visit here. Total time spent: 30 mins including face to face time and time spent for planning, charting and co-ordination of care  Tamsen Meek, MD 10/01/21   'I Deritra, Mcnairy am scribing for Dr. Pamelia Hoit  I have reviewed the above documentation for accuracy and completeness, and I agree with the above.

## 2021-09-29 ENCOUNTER — Other Ambulatory Visit: Payer: Self-pay

## 2021-09-30 MED FILL — Dexamethasone Sodium Phosphate Inj 100 MG/10ML: INTRAMUSCULAR | Qty: 1 | Status: AC

## 2021-09-30 MED FILL — Fosaprepitant Dimeglumine For IV Infusion 150 MG (Base Eq): INTRAVENOUS | Qty: 5 | Status: AC

## 2021-10-01 ENCOUNTER — Inpatient Hospital Stay: Payer: Federal, State, Local not specified - PPO

## 2021-10-01 ENCOUNTER — Inpatient Hospital Stay (HOSPITAL_BASED_OUTPATIENT_CLINIC_OR_DEPARTMENT_OTHER): Payer: Federal, State, Local not specified - PPO | Admitting: Hematology and Oncology

## 2021-10-01 VITALS — HR 100

## 2021-10-01 DIAGNOSIS — E860 Dehydration: Secondary | ICD-10-CM | POA: Diagnosis not present

## 2021-10-01 DIAGNOSIS — Z95828 Presence of other vascular implants and grafts: Secondary | ICD-10-CM

## 2021-10-01 DIAGNOSIS — R5383 Other fatigue: Secondary | ICD-10-CM | POA: Diagnosis not present

## 2021-10-01 DIAGNOSIS — Z5111 Encounter for antineoplastic chemotherapy: Secondary | ICD-10-CM | POA: Diagnosis not present

## 2021-10-01 DIAGNOSIS — C50411 Malignant neoplasm of upper-outer quadrant of right female breast: Secondary | ICD-10-CM

## 2021-10-01 DIAGNOSIS — K5900 Constipation, unspecified: Secondary | ICD-10-CM | POA: Diagnosis not present

## 2021-10-01 DIAGNOSIS — Z171 Estrogen receptor negative status [ER-]: Secondary | ICD-10-CM

## 2021-10-01 DIAGNOSIS — Z5112 Encounter for antineoplastic immunotherapy: Secondary | ICD-10-CM | POA: Diagnosis not present

## 2021-10-01 DIAGNOSIS — R200 Anesthesia of skin: Secondary | ICD-10-CM | POA: Diagnosis not present

## 2021-10-01 DIAGNOSIS — N6331 Unspecified lump in axillary tail of the right breast: Secondary | ICD-10-CM | POA: Diagnosis not present

## 2021-10-01 DIAGNOSIS — T451X5A Adverse effect of antineoplastic and immunosuppressive drugs, initial encounter: Secondary | ICD-10-CM | POA: Diagnosis not present

## 2021-10-01 DIAGNOSIS — Z5189 Encounter for other specified aftercare: Secondary | ICD-10-CM | POA: Diagnosis not present

## 2021-10-01 DIAGNOSIS — D6481 Anemia due to antineoplastic chemotherapy: Secondary | ICD-10-CM | POA: Diagnosis not present

## 2021-10-01 DIAGNOSIS — R197 Diarrhea, unspecified: Secondary | ICD-10-CM | POA: Diagnosis not present

## 2021-10-01 LAB — CBC WITH DIFFERENTIAL (CANCER CENTER ONLY)
Abs Immature Granulocytes: 0.02 10*3/uL (ref 0.00–0.07)
Basophils Absolute: 0 10*3/uL (ref 0.0–0.1)
Basophils Relative: 0 %
Eosinophils Absolute: 0 10*3/uL (ref 0.0–0.5)
Eosinophils Relative: 0 %
HCT: 26.4 % — ABNORMAL LOW (ref 36.0–46.0)
Hemoglobin: 8.7 g/dL — ABNORMAL LOW (ref 12.0–15.0)
Immature Granulocytes: 0 %
Lymphocytes Relative: 9 %
Lymphs Abs: 0.5 10*3/uL — ABNORMAL LOW (ref 0.7–4.0)
MCH: 31.5 pg (ref 26.0–34.0)
MCHC: 33 g/dL (ref 30.0–36.0)
MCV: 95.7 fL (ref 80.0–100.0)
Monocytes Absolute: 0.1 10*3/uL (ref 0.1–1.0)
Monocytes Relative: 2 %
Neutro Abs: 5.1 10*3/uL (ref 1.7–7.7)
Neutrophils Relative %: 89 %
Platelet Count: 132 10*3/uL — ABNORMAL LOW (ref 150–400)
RBC: 2.76 MIL/uL — ABNORMAL LOW (ref 3.87–5.11)
RDW: 17.8 % — ABNORMAL HIGH (ref 11.5–15.5)
WBC Count: 5.8 10*3/uL (ref 4.0–10.5)
nRBC: 0 % (ref 0.0–0.2)

## 2021-10-01 LAB — SAMPLE TO BLOOD BANK

## 2021-10-01 LAB — MAGNESIUM: Magnesium: 1.3 mg/dL — ABNORMAL LOW (ref 1.7–2.4)

## 2021-10-01 LAB — CMP (CANCER CENTER ONLY)
ALT: 34 U/L (ref 0–44)
AST: 22 U/L (ref 15–41)
Albumin: 4 g/dL (ref 3.5–5.0)
Alkaline Phosphatase: 95 U/L (ref 38–126)
Anion gap: 7 (ref 5–15)
BUN: 20 mg/dL (ref 6–20)
CO2: 28 mmol/L (ref 22–32)
Calcium: 9.4 mg/dL (ref 8.9–10.3)
Chloride: 102 mmol/L (ref 98–111)
Creatinine: 1.01 mg/dL — ABNORMAL HIGH (ref 0.44–1.00)
GFR, Estimated: >60 mL/min (ref >60)
Glucose, Bld: 143 mg/dL — ABNORMAL HIGH (ref 70–99)
Potassium: 4 mmol/L (ref 3.5–5.1)
Sodium: 137 mmol/L (ref 135–145)
Total Bilirubin: 0.3 mg/dL (ref 0.3–1.2)
Total Protein: 7 g/dL (ref 6.5–8.1)

## 2021-10-01 MED ORDER — ACETAMINOPHEN 325 MG PO TABS
650.0000 mg | ORAL_TABLET | Freq: Once | ORAL | Status: AC
  Administered 2021-10-01: 650 mg via ORAL
  Filled 2021-10-01: qty 2

## 2021-10-01 MED ORDER — PALONOSETRON HCL INJECTION 0.25 MG/5ML
0.2500 mg | Freq: Once | INTRAVENOUS | Status: AC
  Administered 2021-10-01: 0.25 mg via INTRAVENOUS
  Filled 2021-10-01: qty 5

## 2021-10-01 MED ORDER — SODIUM CHLORIDE 0.9 % IV SOLN
60.0000 mg/m2 | Freq: Once | INTRAVENOUS | Status: AC
  Administered 2021-10-01: 120 mg via INTRAVENOUS
  Filled 2021-10-01: qty 12

## 2021-10-01 MED ORDER — SODIUM CHLORIDE 0.9% FLUSH
10.0000 mL | INTRAVENOUS | Status: DC | PRN
  Administered 2021-10-01: 10 mL

## 2021-10-01 MED ORDER — SODIUM CHLORIDE 0.9 % IV SOLN: Freq: Once | INTRAVENOUS | Status: AC

## 2021-10-01 MED ORDER — DIPHENHYDRAMINE HCL 25 MG PO CAPS
25.0000 mg | ORAL_CAPSULE | Freq: Once | ORAL | Status: AC
  Administered 2021-10-01: 25 mg via ORAL
  Filled 2021-10-01: qty 1

## 2021-10-01 MED ORDER — SODIUM CHLORIDE 0.9 % IV SOLN
420.0000 mg | Freq: Once | INTRAVENOUS | Status: AC
  Administered 2021-10-01: 420 mg via INTRAVENOUS
  Filled 2021-10-01: qty 14

## 2021-10-01 MED ORDER — SODIUM CHLORIDE 0.9 % IV SOLN
10.0000 mg | Freq: Once | INTRAVENOUS | Status: AC
  Administered 2021-10-01: 10 mg via INTRAVENOUS
  Filled 2021-10-01: qty 10

## 2021-10-01 MED ORDER — SODIUM CHLORIDE 0.9 % IV SOLN
426.4000 mg | Freq: Once | INTRAVENOUS | Status: AC
  Administered 2021-10-01: 430 mg via INTRAVENOUS
  Filled 2021-10-01: qty 43

## 2021-10-01 MED ORDER — SODIUM CHLORIDE 0.9% FLUSH
10.0000 mL | Freq: Once | INTRAVENOUS | Status: AC
  Administered 2021-10-01: 10 mL

## 2021-10-01 MED ORDER — HEPARIN SOD (PORK) LOCK FLUSH 100 UNIT/ML IV SOLN
500.0000 [IU] | Freq: Once | INTRAVENOUS | Status: AC | PRN
  Administered 2021-10-01: 500 [IU]

## 2021-10-01 MED ORDER — TRASTUZUMAB-DKST CHEMO 150 MG IV SOLR
6.0000 mg/kg | Freq: Once | INTRAVENOUS | Status: AC
  Administered 2021-10-01: 483 mg via INTRAVENOUS
  Filled 2021-10-01: qty 23

## 2021-10-01 MED ORDER — SODIUM CHLORIDE 0.9 % IV SOLN
150.0000 mg | Freq: Once | INTRAVENOUS | Status: AC
  Administered 2021-10-01: 150 mg via INTRAVENOUS
  Filled 2021-10-01: qty 150

## 2021-10-01 NOTE — Assessment & Plan Note (Signed)
06/14/2018: Right lumpectomy: Intraductal papilloma with usual ductal hyperplasia, 1 benign lymph node (decided against tamoxifen) 05/19/2021:Screening mammogram detected right breast density. 2 lymph nodes were noted biopsy appointment was benign. Ultrasound revealed 2.2 cm mass at 11 o'clock position right breast biopsy: Grade 3 IDC with necrosis, ER 0%, PR 0%, Ki-67 60%, HER2 3+ positive by IHC  Treatment Plan: 1. Neoadjuvant chemotherapy with TCH Perjeta 6 cycles followed by Herceptin Perjeta maintenance versus Kadcyla maintenance (based on response to neoadjuvant chemo) for 1 year 2. Followed by breast conserving surgery with sentinel lymph node study 3. Followed by adjuvant radiation therapy  MRI Breast 06/08/21: Rt Breast cancer 2.8 cm, additional NME total 6.5 cm, Left breast 0.6 cm retroareolar mass, additional indeterminate NME 3.5 cm (Need MRI guided biopsies Rt Br and Left Breast) CT CAP 06/10/21: Breast masses and Bil Axillary LN, 4 mm Rt LL nodule, tiny hepatic densities 4 mm too small, nodular area inferior bladder (wall thickening vs ext effacement) Bone scan 06/10/21: Benign --------------------------------------------------------------------------------------------------------------- Current Treatment:Cycle6TCHP ECHO EF 60-65%  Chemo Toxicities: 1.Severe pelvic bone pain:Mild 2.diarrhea: Alternating with constipation. 3.Fatigue: Profound lasting for a week 4.Chemotherapy-induced anemia: Hemoglobin is9.  If it drops significantly below we may have to lower the dosage of the last cycle 5.Dark line on her forehead: Unclear etiology 6.Diarrheaand leg cramps: We will administer IV fluids on Tuesday after each cycle. 7.Stomach disturbance: Prophylactic Zofran.  She has an appointment for breast MRI and follow-up with surgery. Return to clinic every 3 weeks for Herceptin Perjeta. After final surgery can decide if she can continue the Herceptin Perjeta or we  need to switch to Kadcyla.

## 2021-10-01 NOTE — Patient Instructions (Signed)
[facility] ONCOLOGY   Discharge Instructions: Thank you for choosing [facility] to provide your oncology and hematology care.   If you have a lab appointment with the [facility], please go directly to the [facility] and check in at the registration area.   Wear comfortable clothing and clothing appropriate for easy access to any Portacath or PICC line.   We strive to give you quality time with your provider. You may need to reschedule your appointment if you arrive late (15 or more minutes).  Arriving late affects you and other patients whose appointments are after yours.  Also, if you miss three or more appointments without notifying the office, you may be dismissed from the clinic at the provider's discretion.      For prescription refill requests, have your pharmacy contact our office and allow 72 hours for refills to be completed.    Today you received the following chemotherapy and/or immunotherapy agents: trastuzumab, pertuzumab, docetaxel, and carboplatin      To help prevent nausea and vomiting after your treatment, we encourage you to take your nausea medication as directed.  BELOW ARE SYMPTOMS THAT SHOULD BE REPORTED IMMEDIATELY: *FEVER GREATER THAN 100.4 F (38 C) OR HIGHER *CHILLS OR SWEATING *NAUSEA AND VOMITING THAT IS NOT CONTROLLED WITH YOUR NAUSEA MEDICATION *UNUSUAL SHORTNESS OF BREATH *UNUSUAL BRUISING OR BLEEDING *URINARY PROBLEMS (pain or burning when urinating, or frequent urination) *BOWEL PROBLEMS (unusual diarrhea, constipation, pain near the anus) TENDERNESS IN MOUTH AND THROAT WITH OR WITHOUT PRESENCE OF ULCERS (sore throat, sores in mouth, or a toothache) UNUSUAL RASH, SWELLING OR PAIN  UNUSUAL VAGINAL DISCHARGE OR ITCHING   Items with * indicate a potential emergency and should be followed up as soon as possible or go to the Emergency Department if any problems should occur.  Please show the CHEMOTHERAPY ALERT CARD  or IMMUNOTHERAPY ALERT CARD at check-in to the Emergency Department and triage nurse.  Should you have questions after your visit or need to cancel or reschedule your appointment, please contact [facility]  Dept: [phone]  and follow the prompts.  Office hours are 8:00 a.m. to 4:30 p.m. Monday - Friday. Please note that voicemails left after 4:00 p.m. may not be returned until the following business day.  We are closed weekends and major holidays. You have access to a nurse at all times for urgent questions. Please call the main number to the clinic Dept: [phone] and follow the prompts.   For any non-urgent questions, you may also contact your provider using MyChart. We now offer e-Visits for anyone 105 and older to request care online for non-urgent symptoms. For details visit [email or web address].   Also download the MyChart app! Go to the app store, search "MyChart", open the app, select [facility], and log in with your MyChart username and password.  Masks are optional in the cancer centers. If you would like for your care team to wear a mask while they are taking care of you, please let them know. You may have one support person who is at least 54 years old accompany you for your appointments.

## 2021-10-02 ENCOUNTER — Encounter: Payer: Self-pay | Admitting: *Deleted

## 2021-10-02 ENCOUNTER — Ambulatory Visit
Admission: RE | Admit: 2021-10-02 | Discharge: 2021-10-02 | Disposition: A | Discharge status: Home / Self Care | Payer: Federal, State, Local not specified - PPO | Source: Ambulatory Visit | Attending: Hematology and Oncology | Admitting: Hematology and Oncology

## 2021-10-02 DIAGNOSIS — N6489 Other specified disorders of breast: Secondary | ICD-10-CM | POA: Diagnosis not present

## 2021-10-02 DIAGNOSIS — Z171 Estrogen receptor negative status [ER-]: Secondary | ICD-10-CM

## 2021-10-02 MED ORDER — GADOBUTROL 1 MMOL/ML IV SOLN
7.0000 mL | Freq: Once | INTRAVENOUS | Status: AC | PRN
  Administered 2021-10-02: 7 mL via INTRAVENOUS

## 2021-10-03 ENCOUNTER — Inpatient Hospital Stay: Payer: Federal, State, Local not specified - PPO

## 2021-10-03 VITALS — BP 119/74 | HR 102 | Temp 97.3°F | Resp 18 | Ht 66.0 in

## 2021-10-03 DIAGNOSIS — C50411 Malignant neoplasm of upper-outer quadrant of right female breast: Secondary | ICD-10-CM | POA: Diagnosis not present

## 2021-10-03 DIAGNOSIS — T451X5A Adverse effect of antineoplastic and immunosuppressive drugs, initial encounter: Secondary | ICD-10-CM | POA: Diagnosis not present

## 2021-10-03 DIAGNOSIS — Z5111 Encounter for antineoplastic chemotherapy: Secondary | ICD-10-CM | POA: Diagnosis not present

## 2021-10-03 DIAGNOSIS — R197 Diarrhea, unspecified: Secondary | ICD-10-CM | POA: Diagnosis not present

## 2021-10-03 DIAGNOSIS — E860 Dehydration: Secondary | ICD-10-CM | POA: Diagnosis not present

## 2021-10-03 DIAGNOSIS — Z171 Estrogen receptor negative status [ER-]: Secondary | ICD-10-CM | POA: Diagnosis not present

## 2021-10-03 DIAGNOSIS — Z5112 Encounter for antineoplastic immunotherapy: Secondary | ICD-10-CM | POA: Diagnosis not present

## 2021-10-03 DIAGNOSIS — R200 Anesthesia of skin: Secondary | ICD-10-CM | POA: Diagnosis not present

## 2021-10-03 DIAGNOSIS — K5900 Constipation, unspecified: Secondary | ICD-10-CM | POA: Diagnosis not present

## 2021-10-03 DIAGNOSIS — D6481 Anemia due to antineoplastic chemotherapy: Secondary | ICD-10-CM | POA: Diagnosis not present

## 2021-10-03 DIAGNOSIS — Z5189 Encounter for other specified aftercare: Secondary | ICD-10-CM | POA: Diagnosis not present

## 2021-10-03 DIAGNOSIS — N6331 Unspecified lump in axillary tail of the right breast: Secondary | ICD-10-CM | POA: Diagnosis not present

## 2021-10-03 DIAGNOSIS — R5383 Other fatigue: Secondary | ICD-10-CM | POA: Diagnosis not present

## 2021-10-03 MED ORDER — PEGFILGRASTIM-CBQV 6 MG/0.6ML ~~LOC~~ SOSY
6.0000 mg | PREFILLED_SYRINGE | Freq: Once | SUBCUTANEOUS | Status: AC
  Administered 2021-10-03: 6 mg via SUBCUTANEOUS

## 2021-10-03 NOTE — Patient Instructions (Signed)

## 2021-10-05 ENCOUNTER — Encounter: Payer: Self-pay | Admitting: Hematology and Oncology

## 2021-10-05 ENCOUNTER — Other Ambulatory Visit: Payer: Self-pay

## 2021-10-09 ENCOUNTER — Ambulatory Visit: Payer: Self-pay | Admitting: Surgery

## 2021-10-09 DIAGNOSIS — C50911 Malignant neoplasm of unspecified site of right female breast: Secondary | ICD-10-CM

## 2021-10-13 ENCOUNTER — Encounter: Payer: Self-pay | Admitting: *Deleted

## 2021-10-14 ENCOUNTER — Other Ambulatory Visit: Payer: Self-pay

## 2021-10-20 NOTE — Progress Notes (Signed)
Patient Care Team: Lois Huxley, PA as PCP - General (Family Medicine) Mauro Kaufmann, RN as Oncology Nurse Navigator Rockwell Germany, RN as Oncology Nurse Navigator Erroll Luna, MD as Consulting Physician (General Surgery) Nicholas Lose, MD as Consulting Physician (Hematology and Oncology) Eppie Gibson, MD as Attending Physician (Radiation Oncology)  DIAGNOSIS:  Encounter Diagnosis  Name Primary?   Malignant neoplasm of upper-outer quadrant of right breast in female, estrogen receptor negative ([facility])     SUMMARY OF ONCOLOGIC HISTORY: Oncology History  Malignant neoplasm of upper-outer quadrant of right breast in female, estrogen receptor negative ([facility])  06/14/2018 Surgery   06/14/2018: Right lumpectomy: Intraductal papilloma with usual ductal hyperplasia, 1 benign lymph node (decided against tamoxifen)   05/19/2021 Relapse/Recurrence   Screening mammogram detected right breast density.  2 lymph nodes were noted biopsy appointment was benign.  Ultrasound revealed 2.2 cm mass at 11 o'clock position right breast biopsy: Grade 3 IDC with necrosis, ER 0%, PR 0%, Ki-67 60%, HER2 3+ positive by [facility]   05/27/2021 Cancer Staging   Staging form: Breast, AJCC 8th Edition - Clinical: Stage IIA (cT2, cN0, cM0, G3, ER-, PR-, HER2+) - Signed by Nicholas Lose, MD on 05/27/2021 Stage prefix: Initial diagnosis Histologic grading system: 3 grade system   06/18/2021 - 08/22/2021 Chemotherapy   Patient is on Treatment Plan : BREAST  Docetaxel + Carboplatin + Trastuzumab + Pertuzumab  (TCHP) q21d      06/18/2021 -  Chemotherapy   Patient is on Treatment Plan : BREAST  Docetaxel + Carboplatin + Trastuzumab + Pertuzumab  (TCHP) q21d / Trastuzumab + Pertuzumab q21d       CHIEF COMPLIANT: Follow-up Herceptin Prejeta   INTERVAL HISTORY: Patricia Houston is a 56 y.o. female is here because of recent diagnosis of right breast cancer. She presents to the clinic today for a follow-up and treatment. She  reports no new concerns.She still having fatigue and neuropathy. She states last treatment her toes got worst.  Patient surgery has been scheduled for 11/03/2021.   ALLERGIES:  has No Known Allergies.  MEDICATIONS:  Current Outpatient Medications  Medication Sig Dispense Refill   Cetirizine HCl (ZYRTEC PO) Take by mouth.     cholecalciferol (VITAMIN D3) 25 MCG (1000 UNIT) tablet Take 1 tablet (1,000 Units total) by mouth daily.     Cyanocobalamin (VITAMIN B-12) 3000 MCG SUBL Place 1 tablet under the tongue daily.     Fe Cbn-Fe Gluc-FA-B12-C-DSS (FERRALET 90) 90-1 MG TABS Take 1 tablet by mouth daily.     ferrous sulfate 325 (65 FE) MG tablet      ibuprofen (ADVIL) 200 MG tablet Take 200 mg by mouth as needed for moderate pain.     loratadine (CLARITIN) 10 MG tablet 1 tablet     No current facility-administered medications for this visit.    PHYSICAL EXAMINATION: ECOG PERFORMANCE STATUS: 1 - Symptomatic but completely ambulatory  Vitals:   10/23/21 0837  BP: 119/74  Pulse: (!) 115  Resp: 18  Temp: (!) 97.3 F (36.3 C)  SpO2: 99%   Filed Weights   10/23/21 0837  Weight: 178 lb 12.8 oz (81.1 kg)      LABORATORY DATA:  I have reviewed the data as listed    Latest Ref Rng & Units 10/01/2021    9:02 AM 09/17/2021    9:57 AM 09/10/2021    8:22 AM  CMP  Glucose 70 - 99 mg/dL 143  111  117   BUN 6 -  20 mg/dL _0 Creatinine 0.44 - 1.00 mg/dL 1.01  1.20  1.04   Sodium 135 - 145 mmol/L 137  136  136   Potassium 3.5 - 5.1 mmol/L 4.0  3.7  4.1   Chloride 98 - 111 mmol/L 102  99  105   CO2 22 - 32 mmol/L _1 Calcium 8.9 - 10.3 mg/dL 9.4  9.9  10.0   Total Protein 6.5 - 8.1 g/dL 7.0  7.7  7.3   Total Bilirubin 0.3 - 1.2 mg/dL 0.3  0.6  0.4   Alkaline Phos 38 - 126 U/L 95  106  94   AST 15 - 41 U/L _2 ALT 0 - 44 U/L 34  28  38     Lab Results  Component Value Date   WBC 4.2 10/23/2021   HGB 8.3 (L) 10/23/2021   HCT 25.5 (L) 10/23/2021   MCV 99.6  10/23/2021   PLT 135 (L) 10/23/2021   NEUTROABS 2.5 10/23/2021    ASSESSMENT & PLAN:  Malignant neoplasm of upper-outer quadrant of right breast in female, estrogen receptor negative ([place])  06/14/2018: Right lumpectomy: Intraductal papilloma with usual ductal hyperplasia, 1 benign lymph node (decided against tamoxifen) 05/19/2021: Screening mammogram detected right breast density.  2 lymph nodes were noted biopsy appointment was benign.  Ultrasound revealed 2.2 cm mass at 11 o'clock position right breast biopsy: Grade 3 IDC with necrosis, ER 0%, PR 0%, Ki-67 60%, HER2 3+ positive by IHC   Treatment Plan: 1. Neoadjuvant chemotherapy with TCH Perjeta 6 cycles followed by Herceptin Perjeta maintenance versus Kadcyla maintenance (based on response to neoadjuvant chemo) for 1 year 2. Followed by breast conserving surgery  with sentinel lymph node study 3. Followed by adjuvant radiation therapy ------------------------------------------------------------------------------------------------------------------------------- Breast MRI 10/02/2021: Significant interval decrease in the size of biopsy-proven dominant mass in the right breast measuring 2.5 cm, small enhancing satellite 0.7 cm.  No definite residual mass in the left breast at the site of previously biopsied papilloma  Plan: Herceptin Perjeta until she undergoes surgery. Subsequently, patient will receive Herceptin Perjeta versus Kadcyla. Return to clinic after surgery to discuss the final pathology report.    No orders of the defined types were placed in this encounter.  The patient has a good understanding of the overall plan. she agrees with it. she will call with any problems that may develop before the next visit here. Total time spent: 30 mins including face to face time and time spent for planning, charting and co-ordination of care   Harriette Ohara, MD 10/23/21    I Gardiner Coins am scribing for Dr. Lindi Adie  I have  reviewed the above documentation for accuracy and completeness, and I agree with the above.

## 2021-10-22 ENCOUNTER — Other Ambulatory Visit: Payer: Federal, State, Local not specified - PPO

## 2021-10-22 ENCOUNTER — Ambulatory Visit: Payer: Federal, State, Local not specified - PPO

## 2021-10-22 ENCOUNTER — Ambulatory Visit: Payer: Federal, State, Local not specified - PPO | Admitting: Hematology and Oncology

## 2021-10-23 ENCOUNTER — Inpatient Hospital Stay: Payer: Federal, State, Local not specified - PPO

## 2021-10-23 ENCOUNTER — Inpatient Hospital Stay (HOSPITAL_BASED_OUTPATIENT_CLINIC_OR_DEPARTMENT_OTHER): Payer: Federal, State, Local not specified - PPO | Admitting: Hematology and Oncology

## 2021-10-23 ENCOUNTER — Inpatient Hospital Stay: Payer: Federal, State, Local not specified - PPO | Attending: Hematology and Oncology

## 2021-10-23 VITALS — BP 102/74 | HR 92 | Resp 16

## 2021-10-23 DIAGNOSIS — Z79899 Other long term (current) drug therapy: Secondary | ICD-10-CM | POA: Diagnosis not present

## 2021-10-23 DIAGNOSIS — C50411 Malignant neoplasm of upper-outer quadrant of right female breast: Secondary | ICD-10-CM | POA: Diagnosis not present

## 2021-10-23 DIAGNOSIS — Z5112 Encounter for antineoplastic immunotherapy: Secondary | ICD-10-CM | POA: Insufficient documentation

## 2021-10-23 DIAGNOSIS — Z171 Estrogen receptor negative status [ER-]: Secondary | ICD-10-CM | POA: Diagnosis not present

## 2021-10-23 DIAGNOSIS — N6091 Unspecified benign mammary dysplasia of right breast: Secondary | ICD-10-CM

## 2021-10-23 DIAGNOSIS — R5383 Other fatigue: Secondary | ICD-10-CM | POA: Diagnosis not present

## 2021-10-23 DIAGNOSIS — G629 Polyneuropathy, unspecified: Secondary | ICD-10-CM | POA: Diagnosis not present

## 2021-10-23 LAB — CBC WITH DIFFERENTIAL (CANCER CENTER ONLY)
Abs Immature Granulocytes: 0.02 10*3/uL (ref 0.00–0.07)
Basophils Absolute: 0 10*3/uL (ref 0.0–0.1)
Basophils Relative: 1 %
Eosinophils Absolute: 0.1 10*3/uL (ref 0.0–0.5)
Eosinophils Relative: 1 %
HCT: 25.5 % — ABNORMAL LOW (ref 36.0–46.0)
Hemoglobin: 8.3 g/dL — ABNORMAL LOW (ref 12.0–15.0)
Immature Granulocytes: 1 %
Lymphocytes Relative: 28 %
Lymphs Abs: 1.2 10*3/uL (ref 0.7–4.0)
MCH: 32.4 pg (ref 26.0–34.0)
MCHC: 32.5 g/dL (ref 30.0–36.0)
MCV: 99.6 fL (ref 80.0–100.0)
Monocytes Absolute: 0.5 10*3/uL (ref 0.1–1.0)
Monocytes Relative: 11 %
Neutro Abs: 2.5 10*3/uL (ref 1.7–7.7)
Neutrophils Relative %: 58 %
Platelet Count: 135 10*3/uL — ABNORMAL LOW (ref 150–400)
RBC: 2.56 MIL/uL — ABNORMAL LOW (ref 3.87–5.11)
RDW: 17.2 % — ABNORMAL HIGH (ref 11.5–15.5)
WBC Count: 4.2 10*3/uL (ref 4.0–10.5)
nRBC: 0 % (ref 0.0–0.2)

## 2021-10-23 LAB — CMP (CANCER CENTER ONLY)
ALT: 30 U/L (ref 0–44)
AST: 22 U/L (ref 15–41)
Albumin: 3.6 g/dL (ref 3.5–5.0)
Alkaline Phosphatase: 85 U/L (ref 38–126)
Anion gap: 5 (ref 5–15)
BUN: 11 mg/dL (ref 6–20)
CO2: 28 mmol/L (ref 22–32)
Calcium: 9 mg/dL (ref 8.9–10.3)
Chloride: 106 mmol/L (ref 98–111)
Creatinine: 1 mg/dL (ref 0.44–1.00)
GFR, Estimated: >60 mL/min (ref >60)
Glucose, Bld: 105 mg/dL — ABNORMAL HIGH (ref 70–99)
Potassium: 4 mmol/L (ref 3.5–5.1)
Sodium: 139 mmol/L (ref 135–145)
Total Bilirubin: 0.3 mg/dL (ref 0.3–1.2)
Total Protein: 6.2 g/dL — ABNORMAL LOW (ref 6.5–8.1)

## 2021-10-23 MED ORDER — TRASTUZUMAB-DKST CHEMO 150 MG IV SOLR
6.0000 mg/kg | Freq: Once | INTRAVENOUS | Status: AC
  Administered 2021-10-23: 483 mg via INTRAVENOUS
  Filled 2021-10-23: qty 23

## 2021-10-23 MED ORDER — SODIUM CHLORIDE 0.9 % IV SOLN
420.0000 mg | Freq: Once | INTRAVENOUS | Status: AC
  Administered 2021-10-23: 420 mg via INTRAVENOUS
  Filled 2021-10-23: qty 14

## 2021-10-23 MED ORDER — HEPARIN SOD (PORK) LOCK FLUSH 100 UNIT/ML IV SOLN
500.0000 [IU] | Freq: Once | INTRAVENOUS | Status: AC | PRN
  Administered 2021-10-23: 500 [IU]

## 2021-10-23 MED ORDER — SODIUM CHLORIDE 0.9% FLUSH
10.0000 mL | INTRAVENOUS | Status: DC | PRN
  Administered 2021-10-23: 10 mL

## 2021-10-23 MED ORDER — DIPHENHYDRAMINE HCL 25 MG PO CAPS
25.0000 mg | ORAL_CAPSULE | Freq: Once | ORAL | Status: AC
  Administered 2021-10-23: 25 mg via ORAL
  Filled 2021-10-23: qty 1

## 2021-10-23 MED ORDER — ACETAMINOPHEN 325 MG PO TABS
650.0000 mg | ORAL_TABLET | Freq: Once | ORAL | Status: AC
  Administered 2021-10-23: 650 mg via ORAL
  Filled 2021-10-23: qty 2

## 2021-10-23 MED ORDER — SODIUM CHLORIDE 0.9 % IV SOLN: Freq: Once | INTRAVENOUS | Status: AC

## 2021-10-23 NOTE — Patient Instructions (Signed)
[facility]   Discharge Instructions: Thank you for choosing [facility] to provide your oncology and hematology care.   If you have a lab appointment with the Cancer Center, please go directly to the Cancer Center and check in at the registration area.   Wear comfortable clothing and clothing appropriate for easy access to any Portacath or PICC line.   We strive to give you quality time with your provider. You may need to reschedule your appointment if you arrive late (15 or more minutes).  Arriving late affects you and other patients whose appointments are after yours.  Also, if you miss three or more appointments without notifying the office, you may be dismissed from the clinic at the provider's discretion.      For prescription refill requests, have your pharmacy contact our office and allow 72 hours for refills to be completed.    Today you received the following chemotherapy and/or immunotherapy agents: Trastuzumab (Herceptin) and Pertuzumab (Perjeta)      To help prevent nausea and vomiting after your treatment, we encourage you to take your nausea medication as directed.  BELOW ARE SYMPTOMS THAT SHOULD BE REPORTED IMMEDIATELY: *FEVER GREATER THAN 100.4 F (38 C) OR HIGHER *CHILLS OR SWEATING *NAUSEA AND VOMITING THAT IS NOT CONTROLLED WITH YOUR NAUSEA MEDICATION *UNUSUAL SHORTNESS OF BREATH *UNUSUAL BRUISING OR BLEEDING *URINARY PROBLEMS (pain or burning when urinating, or frequent urination) *BOWEL PROBLEMS (unusual diarrhea, constipation, pain near the anus) TENDERNESS IN MOUTH AND THROAT WITH OR WITHOUT PRESENCE OF ULCERS (sore throat, sores in mouth, or a toothache) UNUSUAL RASH, SWELLING OR PAIN  UNUSUAL VAGINAL DISCHARGE OR ITCHING   Items with * indicate a potential emergency and should be followed up as soon as possible or go to the Emergency Department if any problems should occur.  Please show the CHEMOTHERAPY ALERT CARD or  IMMUNOTHERAPY ALERT CARD at check-in to the Emergency Department and triage nurse.  Should you have questions after your visit or need to cancel or reschedule your appointment, please contact [facility]  Dept: [phone]  and follow the prompts.  Office hours are 8:00 a.m. to 4:30 p.m. Monday - Friday. Please note that voicemails left after 4:00 p.m. may not be returned until the following business day.  We are closed weekends and major holidays. You have access to a nurse at all times for urgent questions. Please call the main number to the clinic Dept: [phone] and follow the prompts.   For any non-urgent questions, you may also contact your provider using MyChart. We now offer e-Visits for anyone 18 and older to request care online for non-urgent symptoms. For details visit [email or web address].   Also download the MyChart app! Go to the app store, search "MyChart", open the app, select , and log in with your MyChart username and password.  Masks are optional in the cancer centers. If you would like for your care team to wear a mask while they are taking care of you, please let them know. You may have one support person who is at least 54 years old accompany you for your appointments. 

## 2021-10-23 NOTE — Assessment & Plan Note (Signed)
06/14/2018: Right lumpectomy: Intraductal papilloma with usual ductal hyperplasia, 1 benign lymph node (decided against tamoxifen) 05/19/2021:Screening mammogram detected right breast density. 2 lymph nodes were noted biopsy appointment was benign. Ultrasound revealed 2.2 cm mass at 11 o'clock position right breast biopsy: Grade 3 IDC with necrosis, ER 0%, PR 0%, Ki-67 60%, HER2 3+ positive by IHC  Treatment Plan: 1. Neoadjuvant chemotherapy with TCH Perjeta 6 cycles followed by Herceptin Perjeta maintenance versus Kadcyla maintenance (based on response to neoadjuvant chemo) for 1 year 2. Followed by breast conserving surgery with sentinel lymph node study 3. Followed by adjuvant radiation therapy ------------------------------------------------------------------------------------------------------------------------------- Breast MRI 10/02/2021: Significant interval decrease in the size of biopsy-proven dominant mass in the right breast measuring 2.5 cm, small enhancing satellite 0.7 cm.  No definite residual mass in the left breast at the site of previously biopsied papilloma  Plan: Herceptin Perjeta until she undergoes surgery. Subsequently, patient will receive Herceptin Perjeta versus Kadcyla. Return to clinic after surgery to discuss the final pathology report.

## 2021-10-26 ENCOUNTER — Telehealth: Payer: Self-pay | Admitting: Hematology and Oncology

## 2021-10-26 ENCOUNTER — Other Ambulatory Visit: Payer: Self-pay

## 2021-10-26 ENCOUNTER — Encounter (HOSPITAL_BASED_OUTPATIENT_CLINIC_OR_DEPARTMENT_OTHER): Payer: Self-pay | Admitting: Surgery

## 2021-10-26 NOTE — Telephone Encounter (Signed)
Scheduled appointment per 10/20 los. Patient is aware of the changes made to her upcoming appointment.

## 2021-10-30 DIAGNOSIS — C50911 Malignant neoplasm of unspecified site of right female breast: Secondary | ICD-10-CM | POA: Diagnosis not present

## 2021-11-02 DIAGNOSIS — R928 Other abnormal and inconclusive findings on diagnostic imaging of breast: Secondary | ICD-10-CM | POA: Diagnosis not present

## 2021-11-02 DIAGNOSIS — C50411 Malignant neoplasm of upper-outer quadrant of right female breast: Secondary | ICD-10-CM | POA: Diagnosis not present

## 2021-11-02 NOTE — Progress Notes (Signed)

## 2021-11-03 ENCOUNTER — Other Ambulatory Visit: Payer: Self-pay

## 2021-11-03 ENCOUNTER — Ambulatory Visit (HOSPITAL_BASED_OUTPATIENT_CLINIC_OR_DEPARTMENT_OTHER): Payer: Federal, State, Local not specified - PPO | Admitting: Anesthesiology

## 2021-11-03 ENCOUNTER — Ambulatory Visit (HOSPITAL_BASED_OUTPATIENT_CLINIC_OR_DEPARTMENT_OTHER)
Admission: RE | Admit: 2021-11-03 | Discharge: 2021-11-03 | Disposition: A | Discharge status: Home / Self Care | Payer: Federal, State, Local not specified - PPO | Source: Ambulatory Visit | Attending: Surgery | Admitting: Surgery

## 2021-11-03 ENCOUNTER — Encounter (HOSPITAL_BASED_OUTPATIENT_CLINIC_OR_DEPARTMENT_OTHER): Payer: Self-pay | Admitting: Surgery

## 2021-11-03 ENCOUNTER — Encounter (HOSPITAL_BASED_OUTPATIENT_CLINIC_OR_DEPARTMENT_OTHER)
Admission: RE | Disposition: A | Discharge status: Home / Self Care | Payer: Self-pay | Source: Ambulatory Visit | Attending: Surgery

## 2021-11-03 DIAGNOSIS — D242 Benign neoplasm of left breast: Secondary | ICD-10-CM | POA: Diagnosis not present

## 2021-11-03 DIAGNOSIS — N6011 Diffuse cystic mastopathy of right breast: Secondary | ICD-10-CM | POA: Diagnosis not present

## 2021-11-03 DIAGNOSIS — N6022 Fibroadenosis of left breast: Secondary | ICD-10-CM | POA: Insufficient documentation

## 2021-11-03 DIAGNOSIS — C50212 Malignant neoplasm of upper-inner quadrant of left female breast: Secondary | ICD-10-CM | POA: Diagnosis not present

## 2021-11-03 DIAGNOSIS — N6042 Mammary duct ectasia of left breast: Secondary | ICD-10-CM | POA: Insufficient documentation

## 2021-11-03 DIAGNOSIS — N6489 Other specified disorders of breast: Secondary | ICD-10-CM | POA: Diagnosis not present

## 2021-11-03 DIAGNOSIS — Z170 Estrogen receptor positive status [ER+]: Secondary | ICD-10-CM | POA: Insufficient documentation

## 2021-11-03 DIAGNOSIS — C50211 Malignant neoplasm of upper-inner quadrant of right female breast: Secondary | ICD-10-CM | POA: Diagnosis not present

## 2021-11-03 DIAGNOSIS — N6012 Diffuse cystic mastopathy of left breast: Secondary | ICD-10-CM | POA: Diagnosis not present

## 2021-11-03 DIAGNOSIS — N6082 Other benign mammary dysplasias of left breast: Secondary | ICD-10-CM | POA: Diagnosis not present

## 2021-11-03 DIAGNOSIS — N6021 Fibroadenosis of right breast: Secondary | ICD-10-CM | POA: Insufficient documentation

## 2021-11-03 DIAGNOSIS — C50911 Malignant neoplasm of unspecified site of right female breast: Secondary | ICD-10-CM | POA: Diagnosis not present

## 2021-11-03 HISTORY — PX: BREAST LUMPECTOMY WITH RADIOACTIVE SEED LOCALIZATION: SHX6424

## 2021-11-03 HISTORY — PX: BREAST LUMPECTOMY WITH RADIOACTIVE SEED AND SENTINEL LYMPH NODE BIOPSY: SHX6550

## 2021-11-03 SURGERY — BREAST LUMPECTOMY WITH RADIOACTIVE SEED AND SENTINEL LYMPH NODE BIOPSY
Anesthesia: General | Site: Breast | Laterality: Right

## 2021-11-03 MED ORDER — ONDANSETRON HCL 4 MG/2ML IJ SOLN
INTRAMUSCULAR | Status: DC | PRN
  Administered 2021-11-03: 4 mg via INTRAVENOUS

## 2021-11-03 MED ORDER — CEFAZOLIN SODIUM-DEXTROSE 2-4 GM/100ML-% IV SOLN
INTRAVENOUS | Status: AC
  Filled 2021-11-03: qty 100

## 2021-11-03 MED ORDER — FENTANYL CITRATE (PF) 100 MCG/2ML IJ SOLN
100.0000 ug | Freq: Once | INTRAMUSCULAR | Status: AC
  Administered 2021-11-03: 100 ug via INTRAVENOUS

## 2021-11-03 MED ORDER — ACETAMINOPHEN 500 MG PO TABS
ORAL_TABLET | ORAL | Status: AC
  Filled 2021-11-03: qty 2

## 2021-11-03 MED ORDER — MIDAZOLAM HCL 2 MG/2ML IJ SOLN
2.0000 mg | Freq: Once | INTRAMUSCULAR | Status: AC
  Administered 2021-11-03: 2 mg via INTRAVENOUS

## 2021-11-03 MED ORDER — DROPERIDOL 2.5 MG/ML IJ SOLN
INTRAMUSCULAR | Status: DC | PRN
  Administered 2021-11-03: .625 mg via INTRAVENOUS

## 2021-11-03 MED ORDER — PHENYLEPHRINE HCL (PRESSORS) 10 MG/ML IV SOLN
INTRAVENOUS | Status: DC | PRN
  Administered 2021-11-03 (×3): 40 ug via INTRAVENOUS

## 2021-11-03 MED ORDER — OXYCODONE HCL 5 MG/5ML PO SOLN: 5.0000 mg | Freq: Once | ORAL | Status: DC | PRN

## 2021-11-03 MED ORDER — FENTANYL CITRATE (PF) 100 MCG/2ML IJ SOLN
INTRAMUSCULAR | Status: AC
  Filled 2021-11-03: qty 2

## 2021-11-03 MED ORDER — ROPIVACAINE HCL 5 MG/ML IJ SOLN
INTRAMUSCULAR | Status: DC | PRN
  Administered 2021-11-03: 30 mL

## 2021-11-03 MED ORDER — OXYCODONE HCL 5 MG PO TABS: 5.0000 mg | ORAL_TABLET | Freq: Once | ORAL | Status: DC | PRN

## 2021-11-03 MED ORDER — HYDROMORPHONE HCL 1 MG/ML IJ SOLN
0.2500 mg | INTRAMUSCULAR | Status: DC | PRN
  Administered 2021-11-03 (×2): 0.5 mg via INTRAVENOUS

## 2021-11-03 MED ORDER — MAGTRACE LYMPHATIC TRACER
INTRAMUSCULAR | Status: DC | PRN
  Administered 2021-11-03: 2 mL via INTRAMUSCULAR

## 2021-11-03 MED ORDER — FENTANYL CITRATE (PF) 100 MCG/2ML IJ SOLN
INTRAMUSCULAR | Status: DC | PRN
  Administered 2021-11-03 (×2): 50 ug via INTRAVENOUS

## 2021-11-03 MED ORDER — HYDROMORPHONE HCL 1 MG/ML IJ SOLN
INTRAMUSCULAR | Status: AC
  Filled 2021-11-03: qty 0.5

## 2021-11-03 MED ORDER — DEXAMETHASONE SODIUM PHOSPHATE 4 MG/ML IJ SOLN
INTRAMUSCULAR | Status: DC | PRN
  Administered 2021-11-03: 4 mg via INTRAVENOUS

## 2021-11-03 MED ORDER — PHENYLEPHRINE 80 MCG/ML (10ML) SYRINGE FOR IV PUSH (FOR BLOOD PRESSURE SUPPORT)
PREFILLED_SYRINGE | INTRAVENOUS | Status: AC
  Filled 2021-11-03: qty 10

## 2021-11-03 MED ORDER — ACETAMINOPHEN 500 MG PO TABS
1000.0000 mg | ORAL_TABLET | ORAL | Status: AC
  Administered 2021-11-03: 1000 mg via ORAL

## 2021-11-03 MED ORDER — IBUPROFEN 800 MG PO TABS: 800.0000 mg | ORAL_TABLET | Freq: Three times a day (TID) | ORAL | 0 refills | Status: DC | PRN

## 2021-11-03 MED ORDER — LIDOCAINE HCL (CARDIAC) PF 100 MG/5ML IV SOSY
PREFILLED_SYRINGE | INTRAVENOUS | Status: DC | PRN
  Administered 2021-11-03: 60 mg via INTRAVENOUS

## 2021-11-03 MED ORDER — OXYCODONE HCL 5 MG PO TABS: 5.0000 mg | ORAL_TABLET | Freq: Four times a day (QID) | ORAL | 0 refills | Status: DC | PRN

## 2021-11-03 MED ORDER — CEFAZOLIN SODIUM-DEXTROSE 2-4 GM/100ML-% IV SOLN
2.0000 g | INTRAVENOUS | Status: AC
  Administered 2021-11-03: 2 g via INTRAVENOUS

## 2021-11-03 MED ORDER — PHENYLEPHRINE 80 MCG/ML (10ML) SYRINGE FOR IV PUSH (FOR BLOOD PRESSURE SUPPORT)
PREFILLED_SYRINGE | INTRAVENOUS | Status: AC
  Filled 2021-11-03: qty 30

## 2021-11-03 MED ORDER — ONDANSETRON HCL 4 MG/2ML IJ SOLN: 4.0000 mg | Freq: Once | INTRAMUSCULAR | Status: DC | PRN

## 2021-11-03 MED ORDER — PROPOFOL 10 MG/ML IV BOLUS
INTRAVENOUS | Status: AC
  Filled 2021-11-03: qty 20

## 2021-11-03 MED ORDER — LACTATED RINGERS IV SOLN: INTRAVENOUS | Status: DC

## 2021-11-03 MED ORDER — MIDAZOLAM HCL 2 MG/2ML IJ SOLN
INTRAMUSCULAR | Status: AC
  Filled 2021-11-03: qty 2

## 2021-11-03 MED ORDER — KETOROLAC TROMETHAMINE 30 MG/ML IJ SOLN
INTRAMUSCULAR | Status: AC
  Filled 2021-11-03: qty 1

## 2021-11-03 MED ORDER — SODIUM CHLORIDE (PF) 0.9 % IJ SOLN
INTRAVENOUS | Status: DC | PRN
  Administered 2021-11-03: 2 mL via INTRAMUSCULAR

## 2021-11-03 MED ORDER — EPHEDRINE 5 MG/ML INJ
INTRAVENOUS | Status: AC
  Filled 2021-11-03: qty 5

## 2021-11-03 MED ORDER — BUPIVACAINE-EPINEPHRINE (PF) 0.25% -1:200000 IJ SOLN
INTRAMUSCULAR | Status: DC | PRN
  Administered 2021-11-03: 30 mL

## 2021-11-03 MED ORDER — PROPOFOL 500 MG/50ML IV EMUL
INTRAVENOUS | Status: DC | PRN
  Administered 2021-11-03: 25 ug/kg/min via INTRAVENOUS

## 2021-11-03 MED ORDER — METHYLENE BLUE 1 % INJ SOLN
INTRAVENOUS | Status: AC
  Filled 2021-11-03: qty 10

## 2021-11-03 MED ORDER — PROPOFOL 10 MG/ML IV BOLUS
INTRAVENOUS | Status: DC | PRN
  Administered 2021-11-03: 150 mg via INTRAVENOUS

## 2021-11-03 MED ORDER — CHLORHEXIDINE GLUCONATE CLOTH 2 % EX PADS: 6.0000 | MEDICATED_PAD | Freq: Once | CUTANEOUS | Status: DC

## 2021-11-03 MED ORDER — KETOROLAC TROMETHAMINE 30 MG/ML IJ SOLN
30.0000 mg | Freq: Once | INTRAMUSCULAR | Status: AC | PRN
  Administered 2021-11-03: 30 mg via INTRAVENOUS

## 2021-11-03 MED ORDER — EPHEDRINE SULFATE (PRESSORS) 50 MG/ML IJ SOLN
INTRAMUSCULAR | Status: DC | PRN
  Administered 2021-11-03: 10 mg via INTRAVENOUS

## 2021-11-03 SURGICAL SUPPLY — 51 items
ADH SKN CLS APL DERMABOND .7 (GAUZE/BANDAGES/DRESSINGS) ×2
APL PRP STRL LF DISP 70% ISPRP (MISCELLANEOUS) ×2
APPLIER CLIP 9.375 MED OPEN (MISCELLANEOUS) ×2
APR CLP MED 9.3 20 MLT OPN (MISCELLANEOUS) ×2
BINDER BREAST LRG (GAUZE/BANDAGES/DRESSINGS) IMPLANT
BINDER BREAST MEDIUM (GAUZE/BANDAGES/DRESSINGS) IMPLANT
BINDER BREAST XLRG (GAUZE/BANDAGES/DRESSINGS) IMPLANT
BINDER BREAST XXLRG (GAUZE/BANDAGES/DRESSINGS) IMPLANT
BLADE SURG 15 STRL LF DISP TIS (BLADE) ×2 IMPLANT
BLADE SURG 15 STRL SS (BLADE) ×2
CANISTER SUC SOCK COL 7IN (MISCELLANEOUS) IMPLANT
CANISTER SUCT 1200ML W/VALVE (MISCELLANEOUS) ×2 IMPLANT
CHLORAPREP W/TINT 26 (MISCELLANEOUS) ×2 IMPLANT
CLIP APPLIE 9.375 MED OPEN (MISCELLANEOUS) ×2 IMPLANT
COVER BACK TABLE 60X90IN (DRAPES) ×2 IMPLANT
COVER MAYO STAND STRL (DRAPES) ×2 IMPLANT
COVER PROBE W GEL 5X96 (DRAPES) ×2 IMPLANT
DERMABOND ADVANCED .7 DNX12 (GAUZE/BANDAGES/DRESSINGS) ×2 IMPLANT
DRAPE LAPAROSCOPIC ABDOMINAL (DRAPES) ×2 IMPLANT
DRAPE LAPAROTOMY 100X72 PEDS (DRAPES) ×2 IMPLANT
DRAPE UTILITY XL STRL (DRAPES) ×2 IMPLANT
ELECT COATED BLADE 2.86 ST (ELECTRODE) ×2 IMPLANT
ELECT REM PT RETURN 9FT ADLT (ELECTROSURGICAL) ×2
ELECTRODE REM PT RTRN 9FT ADLT (ELECTROSURGICAL) ×2 IMPLANT
GLOVE BIOGEL PI IND STRL 8 (GLOVE) ×2 IMPLANT
GLOVE ECLIPSE 8.0 STRL XLNG CF (GLOVE) ×2 IMPLANT
GOWN STRL REUS W/ TWL LRG LVL3 (GOWN DISPOSABLE) ×4 IMPLANT
GOWN STRL REUS W/ TWL XL LVL3 (GOWN DISPOSABLE) ×2 IMPLANT
GOWN STRL REUS W/TWL LRG LVL3 (GOWN DISPOSABLE) ×4
GOWN STRL REUS W/TWL XL LVL3 (GOWN DISPOSABLE) ×2
HEMOSTAT ARISTA ABSORB 3G PWDR (HEMOSTASIS) IMPLANT
HEMOSTAT SNOW SURGICEL 2X4 (HEMOSTASIS) IMPLANT
KIT MARKER MARGIN INK (KITS) ×2 IMPLANT
NDL HYPO 25X1 1.5 SAFETY (NEEDLE) ×2 IMPLANT
NDL SAFETY ECLIP 18X1.5 (MISCELLANEOUS) IMPLANT
NEEDLE HYPO 25X1 1.5 SAFETY (NEEDLE) ×2 IMPLANT
NS IRRIG 1000ML POUR BTL (IV SOLUTION) ×2 IMPLANT
PACK BASIN DAY SURGERY FS (CUSTOM PROCEDURE TRAY) ×2 IMPLANT
PENCIL SMOKE EVACUATOR (MISCELLANEOUS) ×2 IMPLANT
SLEEVE SCD COMPRESS KNEE MED (STOCKING) ×2 IMPLANT
SPIKE FLUID TRANSFER (MISCELLANEOUS) IMPLANT
SPONGE T-LAP 4X18 ~~LOC~~+RFID (SPONGE) ×2 IMPLANT
SUT MNCRL AB 4-0 PS2 18 (SUTURE) ×2 IMPLANT
SUT SILK 2 0 SH (SUTURE) IMPLANT
SUT VICRYL 3-0 CR8 SH (SUTURE) ×2 IMPLANT
SYR CONTROL 10ML LL (SYRINGE) ×2 IMPLANT
TOWEL GREEN STERILE FF (TOWEL DISPOSABLE) ×2 IMPLANT
TRACER MAGTRACE VIAL (MISCELLANEOUS) IMPLANT
TRAY FAXITRON CT DISP (TRAY / TRAY PROCEDURE) ×2 IMPLANT
TUBE CONNECTING 20X1/4 (TUBING) ×2 IMPLANT
YANKAUER SUCT BULB TIP NO VENT (SUCTIONS) ×2 IMPLANT

## 2021-11-03 NOTE — H&P (Signed)
Patricia Houston is a 56 y.o. female who is seen today for follow-up of her stage II right breast cancer. She has finished neoadjuvant chemotherapy with significant reduction in size in the right. She also had 2 areas in the left which was benign consistent with sclerosing papilloma. Overall her MRI is improved after neoadjuvant and she wishes to proceed with breast conserving surgery. She will finished immunotherapy therefore we will keep her port. She has no complaints..    Review of Systems: A complete review of systems was obtained from the patient. I have reviewed this information and discussed as appropriate with the patient. See HPI as well for other ROS.    Medical History: Past Medical History:  Diagnosis Date  Anemia   There is no problem list on file for this patient.  Past Surgical History:  Procedure Laterality Date  MASTECTOMY PARTIAL / LUMPECTOMY 06/2018  HYSTERECTOMY VAGINAL    No Known Allergies  Current Outpatient Medications on File Prior to Visit  Medication Sig Dispense Refill  cholecalciferol (VITAMIN D3) 1000 unit tablet Take by mouth  cyanocobalamin, vitamin B-12, 3,000 mcg Subl Place under the tongue  ferrous sulfate 325 (65 FE) MG tablet 1 tablet   No current facility-administered medications on file prior to visit.   Family History  Problem Relation Age of Onset  Obesity Mother  High blood pressure (Hypertension) Mother  Hyperlipidemia (Elevated cholesterol) Mother  Diabetes Mother  Breast cancer Mother  High blood pressure (Hypertension) Father  Hyperlipidemia (Elevated cholesterol) Father  Coronary Artery Disease (Blocked arteries around heart) Father  Obesity Sister  High blood pressure (Hypertension) Sister  Hyperlipidemia (Elevated cholesterol) Sister  Diabetes Sister  Hyperlipidemia (Elevated cholesterol) Brother  High blood pressure (Hypertension) Brother    Social History   Tobacco Use  Smoking Status Never  Smokeless Tobacco  Never    Social History   Socioeconomic History  Marital status: Married  Tobacco Use  Smoking status: Never  Smokeless tobacco: Never  Vaping Use  Vaping Use: Never used  Substance and Sexual Activity  Alcohol use: Yes  Drug use: Never   Objective:   There were no vitals filed for this visit.  There is no height or weight on file to calculate BMI.  Physical Exam HENT:  Head: Normocephalic.  Eyes:  Pupils: Pupils are equal, round, and reactive to light.  Cardiovascular:  Rate and Rhythm: Normal rate.  Chest:  Breasts: Right: Normal. No swelling, bleeding or mass.  Left: Normal. No swelling, bleeding or mass.  Musculoskeletal:  General: Normal range of motion.  Skin: General: Skin is warm.  Neurological:  General: No focal deficit present.  Mental Status: She is alert.  Psychiatric:  Mood and Affect: Mood normal.    Labs, Imaging and Diagnostic Testing:  Diagnosis 1. Breast, left, needle core biopsy, subareolar (barbell clip) SCLEROSED PAPILLOMA WITH USUAL DUCT HYPERPLASIA ADJACENT FIBROCYSTIC CHANGES INCLUDING STROMAL FIBROSIS AND ADENOSIS MICROCALCIFICATIONS PRESENT WITHIN THE PAPILLOMA AND ADENOSIS NEGATIVE FOR CARCINOMA 2. Breast, left, needle core biopsy, LOQ (cylinder clip) BENIGN FIBROMATOID NODULE PROLIFERATIVE FIBROCYSTIC CHANGES INCLUDING CYSTIC DILATATION OF DUCTS, APOCRINE METAPLASIA, STROMAL FIBROSIS, ADENOSIS, SCLEROSING ADENOSIS AND USUAL DUCT HYPERPLASIA RARE MICROCALCIFICATIONS WITHIN ADENOSIS NEGATIVE FOR CARCINOMA Microscopic Comment 1. -2. Diagnosis called to [facility] at [facility] by Dr. Alric Seton on 06/23/2021 at 10:05 AM. Tobin Chad MD Pathologist, Electronic Signature (Case signed 06/23/2021) Specimen Gross and Clinical Information Specimen Comment 1. TIF: 8:39 AM, CIT < 5 min; mass 2. TIF: 8:44 AM,  CIT < 5 min; NME Specimen(s) Obtained: 1. Breast, left, needle core biopsy, subareolar (barbell  clip) 2. Breast, left, needle core biopsy, LOQ (cylinder clip) 1 of Diagnosis 1. Breast, right, needle core biopsy, upper outer quadrant, barbell clip - BENIGN BREAST TISSUE WITH A SMALL RADIAL SCAR AND BACKGROUND ADENOSIS. - NEGATIVE FOR MALIGNANCY (HISTORY OF RECENT DIAGNOSIS OF GRADE 3 INVASIVE DUCTAL CARCINOMA NOTED). 2. Breast, right, needle core biopsy, upper inner quadrant, cylinder clip - INVASIVE DUCTAL CARCINOMA, NOS (0.8 CM IN GREATEST LINEAR DIMENSION) CONSISTENT WITH THE PATIENT'S KNOWN GRADE 3 INVASIVE DUCTAL CARCINOMA. Microscopic Comment 1. Immunohistochemical stains for the myoepithelial cells show normal retained nuclear staining on part 1. [facility] Hawking at the [facility] was informed of the diagnosis on 06/22/2021 with follow-up on 06/23/21. Tilford Pillar MD Pathologist, Electronic  CLINICAL DATA: 56 year old female with history of right breast invasive ductal carcinoma diagnosed in June 2023 undergoing neoadjuvant chemotherapy. Additional biopsies of the right breast at that time demonstrated small radial scar in the upper outer quadrant and additional site of invasive ductal carcinoma in the upper inner quadrant. Patient had benign left breast MRI guided biopsy at that time demonstrating sclerosis papilloma and fibroadenomatoid nodule.  EXAM: BILATERAL BREAST MRI WITH AND WITHOUT CONTRAST  TECHNIQUE: Multiplanar, multisequence MR images of both breasts were obtained prior to and following the intravenous administration of 7 ml of Gadavist  Three-dimensional MR images were rendered by post-processing of the original MR data on an independent workstation. The three-dimensional MR images were interpreted, and findings are reported in the following complete MRI report for this study. Three dimensional images were evaluated at the independent interpreting workstation using the DynaCAD thin client.  COMPARISON: Previous exam(s).  FINDINGS: Breast  composition: c. Heterogeneous fibroglandular tissue.  Background parenchymal enhancement: Mild  Right breast: There has been significant interval decrease in size of the biopsy proven malignant mass in the right central posterior breast, now measuring approximately 0.5 cm (series 10, image 88). There remains a small satellite enhancing mass in the central medial breast measuring 0.7 cm (series 10, image 76). No new suspicious findings.  Left breast: There is susceptibility artifact in the retroareolar aspect consistent with site of biopsy proven sclerose papilloma. There is no definite residual mass in this location. There is additional susceptibility artifact in the lower outer left breast at the site of biopsy proven fibroadenoma to add nodule an proliferative fibrocystic changes. The associated non mass enhancement is similar in appearance to prior. There are no new suspicious findings in the left breast.  Lymph nodes: No abnormal appearing lymph nodes.  Ancillary findings: None.  IMPRESSION: 1. Significant interval decrease in size of the biopsy-proven malignant dominant mass in the right central posterior breast, now measuring 0.5 cm.  There remains a small enhancing satellite in the central medial breast measuring 0.7 cm. No new suspicious findings in the right breast.  2. No definite residual mass identified in the left breast at the site of biopsy proven sclerosed papilloma. No new suspicious findings in the left breast.  RECOMMENDATION: Continue treatment plan for known right breast cancer.  BI-RADS CATEGORY 6: Known biopsy-proven malignancy.   Electronically Signed By: Audie Pinto M.D. On: 10/02/2021 12:08  Assessment and Plan:   Diagnoses and all orders for this visit:  Stage II breast cancer, right (CMS-HCC)    Patient wishes to proceed with breast conserving surgery. She like the areas in her left breast removed at the same time. Discussed  using foresees to localize  the 4 targets 2 on the right and 2 on the left. Recommend right axillary sent lymph node mapping. Risks and benefits of surgery as well as lumpectomy risk include but not exclusive of bleeding, infection, cosmesis issues, wound complications, lymphedema, injury to major blood vessels, arteries nerves, the need for additional surgery reexcision, the use of tracing material that can discolor the breast, and the need for the treatment standard procedures. She agrees to proceed.  No follow-ups on file.  Kennieth Francois, MD

## 2021-11-03 NOTE — Discharge Instructions (Addendum)
[place] Office Phone Number [phone]  BREAST BIOPSY/ PARTIAL MASTECTOMY: POST OP INSTRUCTIONS  Always review your discharge instruction sheet given to you by the facility where your surgery was performed.  IF YOU HAVE DISABILITY OR FAMILY LEAVE FORMS, YOU MUST BRING THEM TO THE OFFICE FOR PROCESSING.  DO NOT GIVE THEM TO YOUR DOCTOR.  A prescription for pain medication may be given to you upon discharge.  Take your pain medication as prescribed, if needed.  If narcotic pain medicine is not needed, then you may take acetaminophen (Tylenol) or ibuprofen (Advil) as needed. Take your usually prescribed medications unless otherwise directed If you need a refill on your pain medication, please contact your pharmacy.  They will contact our office to request authorization.  Prescriptions will not be filled after 5pm or on week-ends. You should eat very light the first 24 hours after surgery, such as soup, crackers, pudding, etc.  Resume your normal diet the day after surgery. Most patients will experience some swelling and bruising in the breast.  Ice packs and a good support bra will help.  Swelling and bruising can take several days to resolve.  It is common to experience some constipation if taking pain medication after surgery.  Increasing fluid intake and taking a stool softener will usually help or prevent this problem from occurring.  A mild laxative (Milk of Magnesia or Miralax) should be taken according to package directions if there are no bowel movements after 48 hours. Unless discharge instructions indicate otherwise, you may remove your bandages 24-48 hours after surgery, and you may shower at that time.  You may have steri-strips (small skin tapes) in place directly over the incision.  These strips should be left on the skin for 7-10 days.  If your surgeon used skin glue on the incision, you may shower in 24 hours.  The glue will flake off over the next 2-3 weeks.  Any  sutures or staples will be removed at the office during your follow-up visit. ACTIVITIES:  You may resume regular daily activities (gradually increasing) beginning the next day.  Wearing a good support bra or sports bra minimizes pain and swelling.  You may have sexual intercourse when it is comfortable. You may drive when you no longer are taking prescription pain medication, you can comfortably wear a seatbelt, and you can safely maneuver your car and apply brakes. RETURN TO WORK:  ______________________________________________________________________________________ Dennis Bast should see your doctor in the office for a follow-up appointment approximately two weeks after your surgery.  Your doctor's nurse will typically make your follow-up appointment when she calls you with your pathology report.  Expect your pathology report 2-3 business days after your surgery.  You may call to check if you do not hear from [place] after three days. OTHER INSTRUCTIONS: _______________________________________________________________________________________________ _____________________________________________________________________________________________________________________________________ _____________________________________________________________________________________________________________________________________ _____________________________________________________________________________________________________________________________________  WHEN TO CALL YOUR DOCTOR: Fever over 101.0 Nausea and/or vomiting. Extreme swelling or bruising. Continued bleeding from incision. Increased pain, redness, or drainage from the incision.  The clinic staff is available to answer your questions during regular business hours.  Please don't hesitate to call and ask to speak to one of the nurses for clinical concerns.  If you have a medical emergency, go to the nearest emergency room or call 911.  A surgeon from [facility] is always on call at the hospital.  For further questions, please visit [email or web address]    Post Anesthesia Home Care Instructions  Activity: Get plenty of rest for the remainder of  the day. A responsible individual must stay with you for 24 hours following the procedure.  For the next 24 hours, DO NOT: -Drive a car -Paediatric nurse -Drink alcoholic beverages -Take any medication unless instructed by your physician -Make any legal decisions or sign important papers.  Meals: Start with liquid foods such as gelatin or soup. Progress to regular foods as tolerated. Avoid greasy, spicy, heavy foods. If nausea and/or vomiting occur, drink only clear liquids until the nausea and/or vomiting subsides. Call your physician if vomiting continues.  Special Instructions/Symptoms: Your throat may feel dry or sore from the anesthesia or the breathing tube placed in your throat during surgery. If this causes discomfort, gargle with warm salt water. The discomfort should disappear within 24 hours.  I*May have Tylenol after 4pm today 11/03/21 *May have Ibuprofen after 8pm this evening 11/03/21

## 2021-11-03 NOTE — Anesthesia Procedure Notes (Signed)
Anesthesia Procedure Image    

## 2021-11-03 NOTE — Interval H&P Note (Signed)
History and Physical Interval Note:  11/03/2021 10:28 AM  Patricia Houston  has presented today for surgery, with the diagnosis of RIGHT BREAST CANCER AND LEFT SCLEROSING PAPILLOMA.  The various methods of treatment have been discussed with the patient and family. After consideration of risks, benefits and other options for treatment, the patient has consented to  Procedure(s): RIGHT BREAST BRACKETED LUMPECTOMY WITH RADIOACTIVE SEED AND SENTINEL LYMPH NODE BIOPSY (Right) LEFT BREAST BRACKETED LUMPECTOMY WITH RADIOACTIVE SEED LOCALIZATION (Left) as a surgical intervention.  The patient's history has been reviewed, patient examined, no change in status, stable for surgery.  I have reviewed the patient's chart and labs.  Questions were answered to the patient's satisfaction.    The procedure has been discussed with the patient. Alternatives to surgery have been discussed with the patient.  Risks of surgery include bleeding,  Infection,  Seroma formation, death,  and the need for further surgery.   The patient understands and wishes to proceed. Sentinel lymph node mapping and dissection has been discussed with the patient.  Risk of bleeding,  Infection,  Seroma formation,  Additional procedures,,  Shoulder weakness ,  Shoulder stiffness,  arm swelling  Nerve and blood vessel injury and reaction to the mapping dyes have been discussed.  Alternatives to surgery have been discussed with the patient.  The patient agrees to proceed.  [place]

## 2021-11-03 NOTE — Anesthesia Preprocedure Evaluation (Signed)
Anesthesia Evaluation  Patient identified by MRN, date of birth, ID band Patient awake    Reviewed: Allergy & Precautions, NPO status , Patient's Chart, lab work & pertinent test results  Airway Mallampati: II  TM Distance: >3 FB Neck ROM: Full    Dental no notable dental hx.    Pulmonary neg pulmonary ROS,    Pulmonary exam normal breath sounds clear to auscultation       Cardiovascular negative cardio ROS Normal cardiovascular exam Rhythm:Regular Rate:Normal     Neuro/Psych Anxiety negative neurological ROS     GI/Hepatic negative GI ROS, Neg liver ROS,   Endo/Other  negative endocrine ROS  Renal/GU negative Renal ROS  negative genitourinary   Musculoskeletal negative musculoskeletal ROS (+)   Abdominal   Peds negative pediatric ROS (+)  Hematology  (+) Blood dyscrasia, anemia ,   Anesthesia Other Findings   Reproductive/Obstetrics negative OB ROS                             Anesthesia Physical Anesthesia Plan  ASA: 2  Anesthesia Plan: General   Post-op Pain Management: Regional block*   Induction: Intravenous  PONV Risk Score and Plan: 3 and Ondansetron, Dexamethasone, Droperidol, Midazolam and Treatment may vary due to age or medical condition  Airway Management Planned: LMA  Additional Equipment:   Intra-op Plan:   Post-operative Plan: Extubation in OR  Informed Consent: I have reviewed the patients History and Physical, chart, labs and discussed the procedure including the risks, benefits and alternatives for the proposed anesthesia with the patient or authorized representative who has indicated his/her understanding and acceptance.     Dental advisory given  Plan Discussed with: CRNA and Surgeon  Anesthesia Plan Comments:         Anesthesia Quick Evaluation

## 2021-11-03 NOTE — Anesthesia Postprocedure Evaluation (Signed)
Anesthesia Post Note  Patient: Patricia Houston  Procedure(s) Performed: RIGHT BREAST BRACKETED LUMPECTOMY WITH RADIOACTIVE SEED AND SENTINEL LYMPH NODE BIOPSY (Right: Breast) LEFT BREAST BRACKETED LUMPECTOMY WITH RADIOACTIVE SEED LOCALIZATION (Left: Breast)     Patient location during evaluation: PACU Anesthesia Type: General Level of consciousness: awake and alert Pain management: pain level controlled Vital Signs Assessment: post-procedure vital signs reviewed and stable Respiratory status: spontaneous breathing, nonlabored ventilation, respiratory function stable and patient connected to nasal cannula oxygen Cardiovascular status: blood pressure returned to baseline and stable Postop Assessment: no apparent nausea or vomiting Anesthetic complications: no   No notable events documented.  Last Vitals:  Vitals:   11/03/21 1400 11/03/21 1415  BP: 104/76 111/85  Pulse: 86 94  Resp: 12 13  Temp:    SpO2: 98% 100%    Last Pain:  Vitals:   11/03/21 1415  TempSrc:   PainSc: 7                  [place] Starr S

## 2021-11-03 NOTE — Op Note (Signed)
Preoperative diagnosis: Right breast stage II breast cancer, right complex sclerosing lesion, left papilloma, left fibroadenomatoid mass  Postoperative diagnosis: Same  Procedure: Right breast seed localized lumpectomy using 2 cc to bracket both lesions, left breast seed localized lumpectomy x2 using a bracketed approach, right axillary sentinel lymph node mapping using mag trace in methylene blue dye  Surgeon: Erroll Luna, MD  Anesthesia: LMA with pectoral block on the right and 0.25% Marcaine with epinephrine local  EBL: 50 cc  Drains: None  Specimens: Left breast tissue with multiple biopsies of the left.  There is a left breast tissue corresponding to the medial mass.  There is no see or clip in that.  The seed was found separate.  The left breast medial clip was removed separately.  There is a left breast lateral mass that had a seed and clip within it.  All masses were oriented with ink.  On the right, the right more superior medial lesion was a CSL and the more inferior lateral lesion was invasive ductal carcinoma.  This was taken as 1 specimen with both seed and clip in the specimen.  All margins were inked.  There were 3 right Eksir sentinel nodes  IV fluids: Per anesthesia record  Indications for procedure: The patient's presents for treatment of her stage II right breast cancer.  She received neoadjuvant chemotherapy with a good response presents for breast conserving surgery.  We discussed lumpectomies and she wished to conserve both breast.  There were multiple areas that need to be removed with lumpectomies and she was agreeable to do so.  We discussed mastectomy as well is another option.  We discussed reconstruction.  She opted for bilateral breast seed localized lumpectomies with right axillary sentinel lymph node mapping.  We discussed the use of seed as well as methylene blue dye and mag trace.  We discussed potential discoloration of using these agents.  We discussed the  necessity of using these agents..The procedure has been discussed with the patient. Alternatives to surgery have been discussed with the patient.  Risks of surgery include bleeding,  Infection,  Seroma formation, death,  and the need for further surgery.   The patient understands and wishes to proceed. Sentinel lymph node mapping and dissection has been discussed with the patient.  Risk of bleeding,  Infection,  Seroma formation,  Additional procedures,,  Shoulder weakness ,  Shoulder stiffness,  Nerve and blood vessel injury and reaction to the mapping dyes have been discussed.  Alternatives to surgery have been discussed with the patient.  The patient agrees to proceed.    Description of procedure: The patient was met in the holding area and questions were answered.  She underwent a pectoral block on the right (a protocol.  Of note seizure placed as an outpatient.  All questions were answered.  She was then taken back to the operating room.  She is placed supine upon the OR table.  After induction of general esthesia, a timeout was performed under sterile conditions 2 cc of mag trace and 4 cc of methylene blue dye admixed with 2 cc of saline were injected in the right subareolar position and massaged for 5 minutes.  She was then prepped and draped a second time and a second time out was performed.  Proper patient, site and procedure verified.  There are 4 total seeds which was verified by films.  The left side was done first.  Neoprobe used to identify both seeds and these were marked with  a pen.  Local anesthetic was infiltrated and a curvilinear incision was made left breast upper outer quadrant.  The more medial lesion was identified.  Upon removing it the seed came out.  We sent the tissue removed and it had neither seed nor clip it was verified with ink and sent.  I did benefit the seed and was removed separately.  I took a more superior margin and this included the clip in the specimen radiograph.  We  then remove the more lateral left breast lesion through the same cavity.  There is tissue between the 2 that excised as additional medial margin.  We then excised the more lateral mass and the seed and clip were in both and this was oriented with ink.  This cavity was then irrigated.  Local anesthetic was infiltrated throughout.  Once ensuring hemostasis it was closed with a deep layer 3-0 Vicryl.  4 Monocryl was used to close the skin in a subcuticular fashion.  On the right the neoprobe was used to identify both seeds.  A curvilinear incision was made in the left breast upper outer quadrant.  All tissue around both seeds and both clips were excised widely.  Specimen radiograph revealed both seeds and both clips to be present.  The cavity was then clipped for radiation.  Irrigation was used and suctioned out.  Local anesthetic was infiltrated.  We then closed this cavity with a deep layer 3-0 Vicryl.  4 Monocryl was used to close the skin in a subcuticular fashion.  Mag trace probe was used.  Hotspot identified in the right axilla.  A 4 cm incision was made along the inferior border of the right axillary hairline.  Dissection was carried down.  2 nodes were identified which were blue and hot.  These were both removed.  A third node was identified with the blue tracer as well and this was removed.  All 3 also had signals for mag trace.  Background counts approached 0.  The long thoracic nerve, thoracodorsal trunk and extra vein were preserved.  The cavities made hemostatic with cautery.  Was closed after irrigation with a deep layer 3-0 Vicryl.  4 Monocryl was used to close skin in a subcuticular fashion.  Dermabond is applied.  All counts found to be correct.  The patient was awoke extubated taken to recovery in satisfactory condition.

## 2021-11-03 NOTE — Progress Notes (Signed)
Assisted Dr. Kalman Shan with right, pectoralis, ultrasound guided block. Side rails up, monitors on throughout procedure. See vital signs in flow sheet. Tolerated Procedure well.

## 2021-11-03 NOTE — Anesthesia Procedure Notes (Signed)
Procedure Name: LMA Insertion Date/Time: 11/03/2021 11:22 AM  Performed by: Rahaf Carbonell, Ernesta Amble, CRNAPre-anesthesia Checklist: Patient identified, Emergency Drugs available, Suction available and Patient being monitored Patient Re-evaluated:Patient Re-evaluated prior to induction Oxygen Delivery Method: Circle system utilized Preoxygenation: Pre-oxygenation with 100% oxygen Induction Type: IV induction Ventilation: Mask ventilation without difficulty LMA: LMA inserted LMA Size: 4.0 Number of attempts: 1 Airway Equipment and Method: Bite block Placement Confirmation: positive ETCO2 Tube secured with: Tape Dental Injury: Teeth and Oropharynx as per pre-operative assessment

## 2021-11-03 NOTE — Transfer of Care (Signed)
Immediate Anesthesia Transfer of Care Note  Patient: Patricia Houston  Procedure(s) Performed: RIGHT BREAST BRACKETED LUMPECTOMY WITH RADIOACTIVE SEED AND SENTINEL LYMPH NODE BIOPSY (Right: Breast) LEFT BREAST BRACKETED LUMPECTOMY WITH RADIOACTIVE SEED LOCALIZATION (Left: Breast)  Patient Location: PACU  Anesthesia Type:GA combined with regional for post-op pain  Level of Consciousness: drowsy and patient cooperative  Airway & Oxygen Therapy: Patient Spontanous Breathing and Patient connected to face mask oxygen  Post-op Assessment: Report given to RN and Post -op Vital signs reviewed and stable  Post vital signs: Reviewed and stable  Last Vitals:  Vitals Value Taken Time  BP 97/62 11/03/21 1346  Temp    Pulse 95 11/03/21 1347  Resp 15 11/03/21 1347  SpO2 100 % 11/03/21 1347  Vitals shown include unvalidated device data.  Last Pain:  Vitals:   11/03/21 0958  TempSrc: Oral  PainSc: 1       Patients Stated Pain Goal: 1 (76/54/65 0354)  Complications: No notable events documented.

## 2021-11-03 NOTE — Anesthesia Procedure Notes (Signed)
Anesthesia Regional Block: Pectoralis block   Pre-Anesthetic Checklist: , timeout performed,  Correct Patient, Correct Site, Correct Laterality,  Correct Procedure, Correct Position, site marked,  Risks and benefits discussed,  Surgical consent,  Pre-op evaluation,  At surgeon's request and post-op pain management  Laterality: Right  Prep: chloraprep       Needles:  Injection technique: Single-shot  Needle Type: Echogenic Needle     Needle Length: 9cm      Additional Needles:   Procedures:,,,, ultrasound used (permanent image in chart),,    Narrative:  Start time: 11/03/2021 10:36 AM End time: 11/03/2021 10:46 AM Injection made incrementally with aspirations every 5 mL.  Performed by: Personally  Anesthesiologist: Myrtie Soman, MD  Additional Notes: Patient tolerated the procedure well without complications

## 2021-11-04 ENCOUNTER — Encounter (HOSPITAL_BASED_OUTPATIENT_CLINIC_OR_DEPARTMENT_OTHER): Payer: Self-pay | Admitting: Surgery

## 2021-11-05 LAB — SURGICAL PATHOLOGY

## 2021-11-06 ENCOUNTER — Encounter: Payer: Self-pay | Admitting: Surgery

## 2021-11-07 ENCOUNTER — Other Ambulatory Visit: Payer: Self-pay

## 2021-11-09 ENCOUNTER — Encounter: Payer: Self-pay | Admitting: *Deleted

## 2021-11-09 ENCOUNTER — Other Ambulatory Visit: Payer: Self-pay

## 2021-11-09 DIAGNOSIS — C50411 Malignant neoplasm of upper-outer quadrant of right female breast: Secondary | ICD-10-CM

## 2021-11-11 ENCOUNTER — Telehealth: Payer: Self-pay | Admitting: Hematology and Oncology

## 2021-11-11 NOTE — Telephone Encounter (Signed)
Scheduled appointment per 11/8 secure chat. Patient is aware of the changes made to her upcoming appointments.

## 2021-11-11 NOTE — Progress Notes (Signed)
Patient Care Team: Lois Huxley, PA as PCP - General (Family Medicine) Mauro Kaufmann, RN as Oncology Nurse Navigator Rockwell Germany, RN as Oncology Nurse Navigator Erroll Luna, MD as Consulting Physician (General Surgery) Nicholas Lose, MD as Consulting Physician (Hematology and Oncology) Eppie Gibson, MD as Attending Physician (Radiation Oncology)  DIAGNOSIS: No diagnosis found.  SUMMARY OF ONCOLOGIC HISTORY: Oncology History  Malignant neoplasm of upper-outer quadrant of right breast in female, estrogen receptor negative ([place])  06/14/2018 Surgery   06/14/2018: Right lumpectomy: Intraductal papilloma with usual ductal hyperplasia, 1 benign lymph node (decided against tamoxifen)   05/19/2021 Relapse/Recurrence   Screening mammogram detected right breast density.  2 lymph nodes were noted biopsy appointment was benign.  Ultrasound revealed 2.2 cm mass at 11 o'clock position right breast biopsy: Grade 3 IDC with necrosis, ER 0%, PR 0%, Ki-67 60%, HER2 3+ positive by [facility]   05/27/2021 Cancer Staging   Staging form: Breast, AJCC 8th Edition - Clinical: Stage IIA (cT2, cN0, cM0, G3, ER-, PR-, HER2+) - Signed by Nicholas Lose, MD on 05/27/2021 Stage prefix: Initial diagnosis Histologic grading system: 3 grade system   06/18/2021 - 08/22/2021 Chemotherapy   Patient is on Treatment Plan : BREAST  Docetaxel + Carboplatin + Trastuzumab + Pertuzumab  (TCHP) q21d      06/18/2021 -  Chemotherapy   Patient is on Treatment Plan : BREAST  Docetaxel + Carboplatin + Trastuzumab + Pertuzumab  (TCHP) q21d / Trastuzumab + Pertuzumab q21d       CHIEF COMPLIANT: Follow-up Herceptin Prejeta    INTERVAL HISTORY: Patricia Houston is a 56 y.o. female is here because of recent diagnosis of right breast cancer. She presents to the clinic today for a follow-up.   ALLERGIES:  has No Known Allergies.  MEDICATIONS:  Current Outpatient Medications  Medication Sig Dispense Refill   Cetirizine HCl  (ZYRTEC PO) Take by mouth.     cholecalciferol (VITAMIN D3) 25 MCG (1000 UNIT) tablet Take 1 tablet (1,000 Units total) by mouth daily.     Cyanocobalamin (VITAMIN B-12) 3000 MCG SUBL Place 1 tablet under the tongue daily.     Fe Cbn-Fe Gluc-FA-B12-C-DSS (FERRALET 90) 90-1 MG TABS Take 1 tablet by mouth daily.     ferrous sulfate 325 (65 FE) MG tablet      ibuprofen (ADVIL) 200 MG tablet Take 200 mg by mouth as needed for moderate pain.     ibuprofen (ADVIL) 800 MG tablet Take 1 tablet (800 mg total) by mouth every 8 (eight) hours as needed. 30 tablet 0   loratadine (CLARITIN) 10 MG tablet 1 tablet     oxyCODONE (OXY IR/ROXICODONE) 5 MG immediate release tablet Take 1 tablet (5 mg total) by mouth every 6 (six) hours as needed for severe pain. 15 tablet 0   No current facility-administered medications for this visit.    PHYSICAL EXAMINATION: ECOG PERFORMANCE STATUS: {CHL ONC ECOG [phone]}  There were no vitals filed for this visit. There were no vitals filed for this visit.  BREAST:*** No palpable masses or nodules in either right or left breasts. No palpable axillary supraclavicular or infraclavicular adenopathy no breast tenderness or nipple discharge. (exam performed in the presence of a chaperone)  LABORATORY DATA:  I have reviewed the data as listed    Latest Ref Rng & Units 10/23/2021    8:26 AM 10/01/2021    9:02 AM 09/17/2021    9:57 AM  CMP  Glucose 70 - 99 mg/dL 105  143  111   BUN 6 - 20 mg/dL 11  20  23   Creatinine 0.44 - 1.00 mg/dL 1.00  1.01  1.20   Sodium 135 - 145 mmol/L 139  137  136   Potassium 3.5 - 5.1 mmol/L 4.0  4.0  3.7   Chloride 98 - 111 mmol/L 106  102  99   CO2 22 - 32 mmol/L 28  28  28   Calcium 8.9 - 10.3 mg/dL 9.0  9.4  9.9   Total Protein 6.5 - 8.1 g/dL 6.2  7.0  7.7   Total Bilirubin 0.3 - 1.2 mg/dL 0.3  0.3  0.6   Alkaline Phos 38 - 126 U/L 85  95  106   AST 15 - 41 U/L 22  22  21   ALT 0 - 44 U/L 30  34  28     Lab Results  Component  Value Date   WBC 4.2 10/23/2021   HGB 8.3 (L) 10/23/2021   HCT 25.5 (L) 10/23/2021   MCV 99.6 10/23/2021   PLT 135 (L) 10/23/2021   NEUTROABS 2.5 10/23/2021    ASSESSMENT & PLAN:  No problem-specific Assessment & Plan notes found for this encounter.    No orders of the defined types were placed in this encounter.  The patient has a good understanding of the overall plan. she agrees with it. she will call with any problems that may develop before the next visit here. Total time spent: 30 mins including face to face time and time spent for planning, charting and co-ordination of care   Deritra L Mcnairy, CMA 11/11/21    I Deritra, Mcnairy am scribing for Dr. Gudena  ***  

## 2021-11-12 ENCOUNTER — Inpatient Hospital Stay
Payer: Federal, State, Local not specified - PPO | Attending: Hematology and Oncology | Admitting: Hematology and Oncology

## 2021-11-12 ENCOUNTER — Other Ambulatory Visit: Payer: Self-pay

## 2021-11-12 ENCOUNTER — Ambulatory Visit: Payer: Federal, State, Local not specified - PPO

## 2021-11-12 VITALS — BP 129/81 | HR 95 | Temp 97.7°F | Resp 18 | Ht 66.0 in | Wt 177.5 lb

## 2021-11-12 DIAGNOSIS — Z79899 Other long term (current) drug therapy: Secondary | ICD-10-CM | POA: Diagnosis not present

## 2021-11-12 DIAGNOSIS — Z9011 Acquired absence of right breast and nipple: Secondary | ICD-10-CM | POA: Diagnosis not present

## 2021-11-12 DIAGNOSIS — Z171 Estrogen receptor negative status [ER-]: Secondary | ICD-10-CM | POA: Insufficient documentation

## 2021-11-12 DIAGNOSIS — Z5112 Encounter for antineoplastic immunotherapy: Secondary | ICD-10-CM | POA: Insufficient documentation

## 2021-11-12 DIAGNOSIS — C50411 Malignant neoplasm of upper-outer quadrant of right female breast: Secondary | ICD-10-CM | POA: Insufficient documentation

## 2021-11-12 NOTE — Assessment & Plan Note (Signed)
06/14/2018: Right lumpectomy: Intraductal papilloma with usual ductal hyperplasia, 1 benign lymph node (decided against tamoxifen) 05/19/2021: Screening mammogram detected right breast density.  2 lymph nodes were noted biopsy appointment was benign.  Ultrasound revealed 2.2 cm mass at 11 o'clock position right breast biopsy: Grade 3 IDC with necrosis, ER 0%, PR 0%, Ki-67 60%, HER2 3+ positive by IHC   Treatment Plan: 1. Neoadjuvant chemotherapy with TCH Perjeta 6 cycles followed by Herceptin Perjeta maintenance versus Kadcyla maintenance (based on response to neoadjuvant chemo) for 1 year 2. 11/03/2021: Right lumpectomy: Pathologic complete response, 0/4 lymph nodes, margins negative 3. Followed by adjuvant radiation therapy ------------------------------------------------------------------------------------------------------------------------------- Pathology counseling: I discussed the final pathology report of the patient provided  a copy of this report. I discussed the margins as well as lymph node surgeries. We also discussed the final staging along with previously performed ER/PR and HER-2/neu testing.  Treatment plan: Continue with Herceptin Perjeta maintenance and adjuvant radiation.  Return to clinic every 3 weeks for Herceptin and Perjeta and every 6 weeks for follow-up with me.

## 2021-11-13 ENCOUNTER — Other Ambulatory Visit: Payer: Federal, State, Local not specified - PPO

## 2021-11-13 ENCOUNTER — Ambulatory Visit: Payer: Federal, State, Local not specified - PPO

## 2021-11-16 ENCOUNTER — Encounter: Payer: Self-pay | Admitting: *Deleted

## 2021-11-17 ENCOUNTER — Other Ambulatory Visit: Payer: Self-pay

## 2021-11-17 DIAGNOSIS — C50411 Malignant neoplasm of upper-outer quadrant of right female breast: Secondary | ICD-10-CM

## 2021-11-19 ENCOUNTER — Inpatient Hospital Stay: Payer: Federal, State, Local not specified - PPO

## 2021-11-19 ENCOUNTER — Other Ambulatory Visit: Payer: Self-pay

## 2021-11-19 ENCOUNTER — Ambulatory Visit: Payer: Federal, State, Local not specified - PPO

## 2021-11-19 ENCOUNTER — Other Ambulatory Visit: Payer: Federal, State, Local not specified - PPO

## 2021-11-19 VITALS — BP 128/78 | HR 78 | Temp 98.2°F | Resp 18

## 2021-11-19 DIAGNOSIS — C50411 Malignant neoplasm of upper-outer quadrant of right female breast: Secondary | ICD-10-CM | POA: Diagnosis not present

## 2021-11-19 DIAGNOSIS — Z171 Estrogen receptor negative status [ER-]: Secondary | ICD-10-CM

## 2021-11-19 DIAGNOSIS — Z95828 Presence of other vascular implants and grafts: Secondary | ICD-10-CM

## 2021-11-19 DIAGNOSIS — Z79899 Other long term (current) drug therapy: Secondary | ICD-10-CM | POA: Diagnosis not present

## 2021-11-19 DIAGNOSIS — Z5112 Encounter for antineoplastic immunotherapy: Secondary | ICD-10-CM | POA: Diagnosis not present

## 2021-11-19 DIAGNOSIS — Z9011 Acquired absence of right breast and nipple: Secondary | ICD-10-CM | POA: Diagnosis not present

## 2021-11-19 LAB — CBC WITH DIFFERENTIAL (CANCER CENTER ONLY)
Abs Immature Granulocytes: 0.01 10*3/uL (ref 0.00–0.07)
Basophils Absolute: 0 10*3/uL (ref 0.0–0.1)
Basophils Relative: 1 %
Eosinophils Absolute: 0.4 10*3/uL (ref 0.0–0.5)
Eosinophils Relative: 9 %
HCT: 29.4 % — ABNORMAL LOW (ref 36.0–46.0)
Hemoglobin: 9.7 g/dL — ABNORMAL LOW (ref 12.0–15.0)
Immature Granulocytes: 0 %
Lymphocytes Relative: 33 %
Lymphs Abs: 1.5 10*3/uL (ref 0.7–4.0)
MCH: 32 pg (ref 26.0–34.0)
MCHC: 33 g/dL (ref 30.0–36.0)
MCV: 97 fL (ref 80.0–100.0)
Monocytes Absolute: 0.4 10*3/uL (ref 0.1–1.0)
Monocytes Relative: 8 %
Neutro Abs: 2.3 10*3/uL (ref 1.7–7.7)
Neutrophils Relative %: 49 %
Platelet Count: 189 10*3/uL (ref 150–400)
RBC: 3.03 MIL/uL — ABNORMAL LOW (ref 3.87–5.11)
RDW: 14.5 % (ref 11.5–15.5)
WBC Count: 4.6 10*3/uL (ref 4.0–10.5)
nRBC: 0 % (ref 0.0–0.2)

## 2021-11-19 LAB — CMP (CANCER CENTER ONLY)
ALT: 29 U/L (ref 0–44)
AST: 20 U/L (ref 15–41)
Albumin: 4.3 g/dL (ref 3.5–5.0)
Alkaline Phosphatase: 93 U/L (ref 38–126)
Anion gap: 6 (ref 5–15)
BUN: 18 mg/dL (ref 6–20)
CO2: 29 mmol/L (ref 22–32)
Calcium: 10.5 mg/dL — ABNORMAL HIGH (ref 8.9–10.3)
Chloride: 104 mmol/L (ref 98–111)
Creatinine: 1.13 mg/dL — ABNORMAL HIGH (ref 0.44–1.00)
GFR, Estimated: 58 mL/min — ABNORMAL LOW (ref >60)
Glucose, Bld: 83 mg/dL (ref 70–99)
Potassium: 4 mmol/L (ref 3.5–5.1)
Sodium: 139 mmol/L (ref 135–145)
Total Bilirubin: 0.3 mg/dL (ref 0.3–1.2)
Total Protein: 7.5 g/dL (ref 6.5–8.1)

## 2021-11-19 MED ORDER — TRASTUZUMAB-DKST CHEMO 150 MG IV SOLR
6.0000 mg/kg | Freq: Once | INTRAVENOUS | Status: AC
  Administered 2021-11-19: 483 mg via INTRAVENOUS
  Filled 2021-11-19: qty 23

## 2021-11-19 MED ORDER — SODIUM CHLORIDE 0.9% FLUSH
10.0000 mL | Freq: Once | INTRAVENOUS | Status: AC
  Administered 2021-11-19: 10 mL

## 2021-11-19 MED ORDER — DIPHENHYDRAMINE HCL 25 MG PO CAPS
25.0000 mg | ORAL_CAPSULE | Freq: Once | ORAL | Status: AC
  Administered 2021-11-19: 25 mg via ORAL
  Filled 2021-11-19: qty 1

## 2021-11-19 MED ORDER — SODIUM CHLORIDE 0.9 % IV SOLN
420.0000 mg | Freq: Once | INTRAVENOUS | Status: AC
  Administered 2021-11-19: 420 mg via INTRAVENOUS
  Filled 2021-11-19: qty 14

## 2021-11-19 MED ORDER — ACETAMINOPHEN 325 MG PO TABS
650.0000 mg | ORAL_TABLET | Freq: Once | ORAL | Status: AC
  Administered 2021-11-19: 650 mg via ORAL
  Filled 2021-11-19: qty 2

## 2021-11-19 MED ORDER — SODIUM CHLORIDE 0.9 % IV SOLN: Freq: Once | INTRAVENOUS | Status: AC

## 2021-11-20 NOTE — Progress Notes (Addendum)
Location of Breast Cancer:  Malignant neoplasm of upper-outer quadrant of right breast in female, estrogen receptor negative [facility]    Histology per Pathology Report:  11-03-21  FINAL MICROSCOPIC DIAGNOSIS:  A. BREAST, LEFT ADDITIONAL MEDIAL MARGIN, EXCISION: Extensive sclerosing adenosis and fibrocystic changes with usual epithelial hyperplasia. A scar with clusters of pigment laden macrophages. Negative for ADH, ALH, DCIS and invasive carcinoma.  B. RADIOACTIVE SEED, LEFT, REMOVAL: Radioactive seed.  C. BREAST, RIGHT, LUMPECTOMY: Scar with pigment laden macrophages. A small intraductal papilloma without cytologic atypia in the vicinity of the scar. Carcinoma is not identified in any of the sections examined. Please see the synoptic report after specimen I.  D. BREAST, LEFT MEDIAL, LUMPECTOMY: Extensive sclerosing adenosis and fibrocystic changes with usual epithelial hyperplasia. Small intraductal papillomas without cytologic atypia. Negative for ADH, ALH, DCIS and invasive carcinoma.  E. BREAST, LEFT LATERAL, LUMPECTOMY: Extensive sclerosing adenosis and fibrocystic changes with usual epithelial hyperplasia Negative for ADH, ALH, DCIS and invasive carcinoma.  F. LYMPH NODE, RIGHT AXILLARY, SENTINEL, EXCISION: [place] follicular hyperplasia of the lymph node which is negative for metastatic carcinoma.  G. LYMPH NODE, RIGHT AXILLARY, SENTINEL, EXCISION: [place] follicular hyperplasia of a  lymph node with a cyst lined by foreign body type giant cells. Negative for metastatic carcinoma.  H. LYMPH NODE, RIGHT AXILLARY, SENTINEL, EXCISION: Follicular hyperplasia of the lymph node which is negative for metastatic carcinoma.   I. LYMPH NODE, RIGHT AXILLARY, SENTINEL, EXCISION: Follicular hyperplasia of lymph node which is negative for metastatic carcinoma.   INVASIVE CARCINOMA OF THE BREAST:  Resection (post neoadjuvant therapy)  Procedure: Lumpectomy Specimen  Laterality: Right. Histologic Type: Invasive ductal carcinoma (from prior biopsy done on 05/19/2021) Histologic Grade:      Glandular (Acinar)/Tubular Differentiation: Not applicable.      Nuclear Pleomorphism: Not applicable.      Mitotic Rate: Not applicable.      Overall Grade: Not applicable. Tumor Size: No residual carcinoma. Ductal Carcinoma In Situ: None. Tumor Extent: No residual carcinoma in the resected specimen. Treatment Effect in the breast: Complete response. Margins: All margins negative for invasive carcinoma.      Distance from Closest Margin (mm): Not applicable      Specify Closest Margin (required only if <24MG): Not applicable. DCIS Margins: [Uninvolved by DCIS]      Distance from Closest Margin (mm): Not applicable.      Specify Closest Margin (required only if <50IB): Not applicable. Regional Lymph Nodes:      Number of Lymph Nodes Examined: Four (4).      Number of Sentinel Nodes Examined: Four (4).      Number of Lymph Nodes with Macrometastases: None (0).      Number of Lymph Nodes with Micrometastases: None (0)      Number of Lymph Nodes with Isolated Tumor Cells (=0.2 mm or =200 cells): None (0).      Size of Largest Metastatic Deposit (mm): Not applicable.      Extranodal Extension: Not applicable. Distant Metastasis:      Distant Site(s) Involved: None known. Breast Biomarker Testing Performed on Previous Biopsy:      Testing Performed on Case Number: [id number] from 05/19/2021.            Estrogen Receptor: 0 / Negative.            Progesterone Receptor: 0 / Negative.            HER2: 3+ (Positive).  Ki-67:  60%. Pathologic Stage Classification (pTNM, AJCC 8th Edition): pT(y) 0, pN(y) 0 Representative Tumor Block: None. Comment(s): None.  (v4.5.0.0)   Receptor Status:  Grade 3 IDC with necrosis, ER 0%, PR 0%, Ki-67 60%, HER2 3+ positive by IHC  Did patient present with symptoms (if so, please note symptoms) or was this found on  screening mammography?: routine mammogram  Past/Anticipated interventions by surgeon, if any: 11-03-21 Procedure: Right breast seed localized lumpectomy using 2 cc to bracket both lesions, left breast seed localized lumpectomy x2 using a bracketed approach, right axillary sentinel lymph node mapping using mag trace in methylene blue dye   Surgeon: Erroll Luna, MD   Past/Anticipated interventions by medical oncology, if any:   Dr. Lindi Adie 11-12-21  ASSESSMENT & PLAN:  Malignant neoplasm of upper-outer quadrant of right breast in female, estrogen receptor negative ([place])  06/14/2018: Right lumpectomy: Intraductal papilloma with usual ductal hyperplasia, 1 benign lymph node (decided against tamoxifen) 05/19/2021: Screening mammogram detected right breast density.  2 lymph nodes were noted biopsy appointment was benign.  Ultrasound revealed 2.2 cm mass at 11 o'clock position right breast biopsy: Grade 3 IDC with necrosis, ER 0%, PR 0%, Ki-67 60%, HER2 3+ positive by IHC   Treatment Plan: 1. Neoadjuvant chemotherapy with TCH Perjeta 6 cycles followed by Herceptin Perjeta maintenance versus Kadcyla maintenance (based on response to neoadjuvant chemo) for 1 year 2. 11/03/2021: Right lumpectomy: Pathologic complete response, 0/4 lymph nodes, margins negative 3. Followed by adjuvant radiation therapy ------------------------------------------------------------------------------------------------------------------------------- Pathology counseling: I discussed the final pathology report of the patient provided  a copy of this report. I discussed the margins as well as lymph node surgeries. We also discussed the final staging along with previously performed ER/PR and HER-2/neu testing.   Treatment plan: Continue with Herceptin Perjeta maintenance and adjuvant radiation.   Return to clinic every 3 weeks for Herceptin and Perjeta and every 6 weeks for follow-up with me.  Lymphedema issues, if any:   none at this time,  Pain issues, if any:  no pain  SAFETY ISSUES: Prior radiation? None, had chemo finished in september Pacemaker/ICD? no Possible current pregnancy?no, had hysterectomy Is the patient on methotrexate? no  Current Complaints / other details:  cording skin changes in arm but no swelling  Vitals:   11/30/21 0740  BP: 116/85  Pulse: 90  Resp: 18  Temp: 98.3 F (36.8 C)  SpO2: 100%

## 2021-11-24 ENCOUNTER — Ambulatory Visit: Payer: Federal, State, Local not specified - PPO | Attending: Hematology and Oncology | Admitting: Rehabilitation

## 2021-11-24 ENCOUNTER — Encounter: Payer: Self-pay | Admitting: Rehabilitation

## 2021-11-24 DIAGNOSIS — M25511 Pain in right shoulder: Secondary | ICD-10-CM | POA: Insufficient documentation

## 2021-11-24 DIAGNOSIS — Z171 Estrogen receptor negative status [ER-]: Secondary | ICD-10-CM | POA: Diagnosis not present

## 2021-11-24 DIAGNOSIS — R293 Abnormal posture: Secondary | ICD-10-CM | POA: Insufficient documentation

## 2021-11-24 DIAGNOSIS — C50411 Malignant neoplasm of upper-outer quadrant of right female breast: Secondary | ICD-10-CM | POA: Insufficient documentation

## 2021-11-24 DIAGNOSIS — M25611 Stiffness of right shoulder, not elsewhere classified: Secondary | ICD-10-CM | POA: Insufficient documentation

## 2021-11-24 NOTE — Therapy (Signed)
OUTPATIENT PHYSICAL THERAPY BREAST CANCER POST OP FOLLOW UP   Patient Name: Patricia Houston MRN: [id number] DOB:24-Nov-1965, 56 y.o., female Today's Date: 11/24/2021  END OF SESSION:  PT End of Session - 11/24/21 1005     Visit Number 2    Number of Visits 2    Date for PT Re-Evaluation 09/04/21    PT Start Time 1005    Activity Tolerance Patient tolerated treatment well    Behavior During Therapy [facility] for tasks assessed/performed             Past Medical History:  Diagnosis Date   Anemia    TAKES IRON PILLS   Anxiety    Cancer ([place])    right breast DCIS   Depression    Past Surgical History:  Procedure Laterality Date   ABDOMINAL HYSTERECTOMY     BREAST LUMPECTOMY WITH RADIOACTIVE SEED AND SENTINEL LYMPH NODE BIOPSY Right 11/03/2021   Procedure: RIGHT BREAST BRACKETED LUMPECTOMY WITH RADIOACTIVE SEED AND SENTINEL LYMPH NODE BIOPSY;  Surgeon: Erroll Luna, MD;  Location: [place];  Service: General;  Laterality: Right;   BREAST LUMPECTOMY WITH RADIOACTIVE SEED LOCALIZATION Right 06/14/2018   Procedure: RIGHT BREAST RADIOACTIVE SEED LOCALIZATION LUMPECTOMY X2;  Surgeon: Erroll Luna, MD;  Location: [place];  Service: General;  Laterality: Right;   BREAST LUMPECTOMY WITH RADIOACTIVE SEED LOCALIZATION Left 11/03/2021   Procedure: LEFT BREAST BRACKETED LUMPECTOMY WITH RADIOACTIVE SEED LOCALIZATION;  Surgeon: Erroll Luna, MD;  Location: [place];  Service: General;  Laterality: Left;   IR IMAGING GUIDED PORT INSERTION  06/17/2021   LAPAROSCOPIC ASSISTED VAGINAL HYSTERECTOMY N/A 02/27/2014   Procedure: LAPAROSCOPIC ASSISTED VAGINAL HYSTERECTOMY;  Surgeon: Maeola Sarah. Landry Mellow, MD;  Location: [facility];  Service: Gynecology;  Laterality: N/A;  abdomen to vagina to abdomen   TONSILLECTOMY     Patient Active Problem List   Diagnosis Date Noted   Vitamin D deficiency 07/09/2021   Iron deficiency anemia 07/09/2021    Hyperlipidemia 07/09/2021   Port-A-Cath in place 06/18/2021   Malignant neoplasm of upper-outer quadrant of right breast in female, estrogen receptor negative ([place]) 05/27/2021   Atypical ductal hyperplasia of right breast 07/18/2018   Genetic testing 03/31/2018   Family history of breast cancer in mother 04/03/2015   Family history of pancreatic cancer 04/03/2015   S/P hysterectomy 02/27/2014   ANEMIA 07/01/2009    PCP: Marilynne Drivers PA   REFERRING PROVIDER:  Dr. Lindi Adie   REFERRING DIAG: Rt breast cancer   THERAPY DIAG:  Malignant neoplasm of upper-outer quadrant of right breast in female, estrogen receptor negative ([place])  Abnormal posture  Rationale for Evaluation and Treatment: Rehabilitation  ONSET DATE: 05/19/2021  SUBJECTIVE:  SUBJECTIVE STATEMENT: She has stopped  chemotherapy. She is having discomfort on the R>L arm. She feels like the compression bra is on the incision site and causing the pain. She does not feel like she has her motion back from prior to surgery but that is due to the discomfort she is having. She doesn't feel any pulling with ROM  PERTINENT HISTORY:  Breast cancer recurrence noted 05/19/21. Hx of Rt lumpectomy x 2 in 2010 with Dr. Brantley Stage.  0/1 lymph node removed. Plan is for neoadjuvant chemotherapy TCHP followed by lumpectomy and SLNB and radiation.   PATIENT GOALS:  Reassess how my recovery is going related to arm function, pain, and swelling.  PAIN:  Are you having pain? Yes: NPRS scale: 8/10 Pain location: Across the top of the breast and under axillary  Pain description: dull, achy, sharp, tender to touch  Aggravating factors: moving the arm Relieving factors: Removing the compression bra   PRECAUTIONS: Recent Surgery, right UE Lymphedema risk, None  ACTIVITY  LEVEL / LEISURE: ride bike, trying hand weights, have a total gym but it has been a few months    OBJECTIVE:   PATIENT SURVEYS:  QUICK DASH: 45.45  OBSERVATIONS: Axillary cording in the right arm  Incision site healing as expected with the presence of glue   POSTURE:  Rounded shoulder posture   LYMPHEDEMA ASSESSMENT:  UPPER EXTREMITY AROM/PROM:   A/PROM RIGHT   eval   11/24/2021  Shoulder extension 60 55  Shoulder flexion 150 119 tight and pulling   Shoulder abduction 165 - top of the shoulder and upper arm feels tight 121- tight and pulling   Shoulder internal rotation     Shoulder external rotation 91 80                          (Blank rows = not tested)   A/PROM LEFT   eval 11/24/2021  Shoulder extension 60 70  Shoulder flexion 150 151  Shoulder abduction 165 165  Shoulder internal rotation     Shoulder external rotation                             (Blank rows = not tested)   SHOULDER STRENGTH: Flexion: 5/5 bil  Abduction: 4+/5 bil with some pain on the top of the Rt shoulder ER: 5/5 IR:5/5   CERVICAL AROM: All within normal limits:    LYMPHEDEMA ASSESSMENTS:    LANDMARK RIGHT   eval 11/24/2021  10 cm proximal to olecranon process 33.5 31  Olecranon process 27 25.6  10 cm proximal to ulnar styloid process 22 21.5  Just proximal to ulnar styloid process 15.7 15.1  Across hand at thumb web space 18.5 17.6  At base of 2nd digit 5.9 5.5  (Blank rows = not tested)   LANDMARK LEFT   eval  10 cm proximal to olecranon process 33.5  Olecranon process 27  10 cm proximal to ulnar styloid process 22  Just proximal to ulnar styloid process 15.4  Across hand at thumb web space 18.7  At base of 2nd digit 6.0  (Blank rows = not tested)  Surgery type/Date: 11/03/2021 right and left  breast lumpectomy  Number of lymph nodes removed: 4 Current/past treatment (chemo, radiation, hormone therapy): chemotherapy, radiation, herceptin and perjeta  Other symptoms:   Heaviness/tightness No Pain Yes Pitting edema No Infections No  PATIENT EDUCATION:  Education details: ABCC, compression  garments, scar massage, SOZO, posture, POC and HEP Person educated: Patient Education method: Explanation and Handouts Education comprehension: verbalized understanding  HOME EXERCISE PROGRAM: Reviewed previously given post op HEP.   ASSESSMENT:  CLINICAL IMPRESSION: Patient present to clinic after right lumpectomy and lymph node biopsy and left lumpectomy on 11/03/2021. Patient demonstrated lack of right ROM due to pain and tenderness. Patient had the presence of axillary cording in the right arm which could be contributing to limit in ROM. Patient would benefit from skilled therapy to work on strength and mobility deficits.   Pt will benefit from skilled therapeutic intervention to improve on the following deficits: Decreased knowledge of precautions, impaired UE functional use, pain, decreased ROM, postural dysfunction.   PT treatment/interventions: ADL/Self care home management, Therapeutic exercises, Therapeutic activity, Neuromuscular re-education, Balance training, Gait training, Patient/Family education, Self Care, Joint mobilization, Dry Needling, Manual lymph drainage, Compression bandaging, and scar mobilization   GOALS: Goals reviewed with patient? Yes  LONG TERM GOALS:  (STG=LTG)  GOALS Name Target Date  Goal status  1 Pt will demonstrate she has regained full shoulder ROM and function post operatively compared to baselines.  Baseline: 01/12/2022 INITIAL  2 Patient will report 75 % less discomfort with ROM due to cording to demonstrated improvement in function  01/12/2022 INITIAL  3 Pt will report 90% reduction of pain due to improvements in posture, strength, and muscle length  01/12/2022 INITIAL  4 Pt will be independent with advanced HEP to maintain improvements made throughout therapy    01/12/2022 INITIAL     PLAN:  PT FREQUENCY/DURATION: 1x  6 weeks  PLAN FOR NEXT SESSION: STM to cording and gentle PROM and stretching    [facility]  [address], [address]  [place] [address]  [phone]  After Breast Cancer Class It is recommended you attend the ABC class to be educated on lymphedema risk reduction. This class is free of charge and lasts for 1 hour. It is a 1-time class. You will need to download the Webex app either on your phone or computer. We will send you a link the night before or the morning of the class. You should be able to click on that link to join the class. This is not a confidential class. You don't have to turn your camera on, but other participants may be able to see your email address.  Scar massage You can begin gentle scar massage to you incision sites. Gently place one hand on the incision and move the skin (without sliding on the skin) in various directions. Do this for a few minutes and then you can gently massage either coconut oil or vitamin E cream into the scars.  Compression garment You should continue wearing your compression bra until you feel like you no longer have swelling.  Home exercise Program Continue doing the exercises you were given until you feel like you can do them without feeling any tightness at the end.   Walking Program Studies show that 30 minutes of walking per day (fast enough to elevate your heart rate) can significantly reduce the risk of a cancer recurrence. If you can't walk due to other medical reasons, we encourage you to find another activity you could do (like a stationary bike or water exercise).  Posture After breast cancer surgery, people frequently sit with rounded shoulders posture because it puts their incisions on slack and feels better. If you sit like this and scar tissue forms in that position,  you can become very tight and have pain sitting or standing with good posture. Try to be aware of your posture and sit and stand up tall to  heal properly.  Follow up PT: It is recommended you return every 3 months for the first 3 years following surgery to be assessed on the SOZO machine for an L-Dex score. This helps prevent clinically significant lymphedema in 95% of patients. These follow up screens are 10 minute appointments that you are not billed for.   Rosario Jacks, Student-PT 11/24/2021  10:55 AM

## 2021-11-25 ENCOUNTER — Telehealth: Payer: Self-pay

## 2021-11-25 ENCOUNTER — Other Ambulatory Visit: Payer: Self-pay

## 2021-11-25 NOTE — Telephone Encounter (Signed)
Notified Patient of completion of FMLA forms. Copy of forms e-mailed to Patient at e-mail address provided. No other needs or concerns voiced at this time.

## 2021-11-29 NOTE — Progress Notes (Signed)
Radiation Oncology         [phone] ________________________________  Name: Patricia Houston MRN: [id number]  Date: 11/30/2021  DOB: [birth year]  Follow-Up Visit Note  Outpatient  CC: Lois Huxley, [place]  Nicholas Lose, MD  Diagnosis:      ICD-10-CM   1. Malignant neoplasm of upper-outer quadrant of right breast in female, estrogen receptor negative ([place])  C50.411    Z17.1      S/p neoadjuvant chemotherapy and bilateral lumpectomies: no residual carcinoma in the right breast w/ negative nodes  Stage IIA (cT2, cN0, cM0) Right Breast UOQ Invasive Ductal Carcinoma, ER- / PR- / Her2+, Grade 3  pT(y)0, pN(y)0   CHIEF COMPLAINT: Here to discuss management of right breast cancer  Narrative:  The patient returns today for follow-up.     Since consultation date of 06/12/21, she underwent the following imaging (dates and results as follows):  -- CTA of the chest on 09/17/21, prompted due to suspected PE, chest pain, SOB, showed no evidence of pulmonary emboli or other acute abnormality in the chest. An unchanged 3 mm RLL nodule was also appreciated.   Breast or nodal biopsies, since consultation, involved (dates and results as follows):  -- Biopsy of the upper inner right breast on 06/19/21 showed invasive ductal carcinoma consistent with the known site of grade 3 IDC.  Biopsy of the upper outer right breast also performed that same date showed benign findings.  -- Biopsy of the subareolar left breast on 06/22/21 showed no evidence of carcinoma and findings consistent with a sclerosed papilloma with usual duct hyperplasia. Biopsy of the lower outer left breast also performed showed findings consistent with a benign fibromatoid nodule and proliferative fibrocystic changes.   Systemic therapy, if applicable, involved (dates and therapy as follows): she has been treated with chemotherapy consisting of Thorp 6 cycles from 06/18/21 through 08/22/21 under the care of Dr. Lindi Adie. Chemo  toxicities encountered by the patient throughout the course of systemic treatment included fatigue, nausea, diarrhea, and neuropathy. She continues to have some mild fatigue and neuropathy but has overall recovered well from treatment. She is now on Herceptin Perjeta maintenance therapy which she will continue concurrently with XRT.  Bilateral breast MRI on 10/02/21 showed a significant interval decrease in size of the biopsy-proven malignant dominant mass in the right central posterior breast, measuring 0.5 cm, and a small residual enhancing satellite in the central medial right breast measuring 0.7 cm. MRI also showed no definite residual mass identified in the left breast at the site of biopsy proven sclerosed papilloma. No new suspicious findings in either breast were appreciated.   Bilateral breast MRI on 10/23/21 showed a significant interval decrease in the size of biopsy-proven dominant mass in the right breast measuring 2.5 cm. A small enhancing satellite nodule was appreciated, measuring 0.7 cm.  MRI otherwise showed no definite residual mass in the left breast at the site of previously biopsied papilloma.   She opted to proceed with bilateral lumpectomies and right SLN biopsies on 11/03/21 under the care of Dr. Brantley Stage. Pathology from her right breast lumpectomy revealed: no evidence of residual carcinoma with a small intraductal papilloma without cytologic atypia in the vicinity of the scar; nodal status of 4/4 right axillary sentinel lymph node excisions negative for carcinoma. Pathology from left breast lumpectomies (lateral left breast and medial left breast) showed extensive sclerosing adenosis and fibrocystic changes with usual epithelial hyperplasia from both specimens. Left medial lumpectomy also showed small intraductal papillomas  without cytologic atypia.    The patient will continue to meet with Dr. Lindi Adie every 6 weeks for follow-up of Herceptin and Perjeta maintenance.    Symptomatically, the patient reports:    Lymphedema issues, if any:  none at this time  Pain issues, if any:  no pain  SAFETY ISSUES: Prior radiation? None, had chemo finished in september Pacemaker/ICD? no Possible current pregnancy?no, had hysterectomy Is the patient on methotrexate? no  Current Complaints / other details:  cording skin changes in arm but no swelling -she is seeing physical therapy.  She works from home.  Vitals:   11/30/21 0740  BP: 116/85  Pulse: 90  Resp: 18  Temp: 98.3 F (36.8 C)  SpO2: 100%             ALLERGIES:  has No Known Allergies.  Meds: Current Outpatient Medications  Medication Sig Dispense Refill   Cetirizine HCl (ZYRTEC PO) Take by mouth.     cholecalciferol (VITAMIN D3) 25 MCG (1000 UNIT) tablet Take 1 tablet (1,000 Units total) by mouth daily.     Cyanocobalamin (VITAMIN B-12) 3000 MCG SUBL Place 1 tablet under the tongue daily.     ferrous sulfate 325 (65 FE) MG tablet      ibuprofen (ADVIL) 200 MG tablet Take 200 mg by mouth as needed for moderate pain.     ibuprofen (ADVIL) 800 MG tablet Take 1 tablet (800 mg total) by mouth every 8 (eight) hours as needed. 30 tablet 0   Fe Cbn-Fe Gluc-FA-B12-C-DSS (FERRALET 90) 90-1 MG TABS Take 1 tablet by mouth daily. (Patient not taking: Reported on 11/30/2021)     loratadine (CLARITIN) 10 MG tablet 1 tablet (Patient not taking: Reported on 11/30/2021)     No current facility-administered medications for this encounter.    Physical Findings:  height is _0  (1.676 m) and weight is 175 lb (79.4 kg). Her oral temperature is 98.3 F (36.8 C). Her blood pressure is 116/85 and her pulse is 90. Her respiration is 18 and oxygen saturation is 100%. .     General: Alert and oriented, in no acute distress Heart: Regular in rate and rhythm with no murmurs, rubs, or gallops. Chest: Clear to auscultation bilaterally, with no rhonchi, wheezes, or rales.  Left chest Port-A-Cath  intact. Musculoskeletal: Good range of motion in shoulders bilaterally.  Symmetric strength and muscle tone throughout. Neurologic: No obvious focalities. Speech is fluent.  Psychiatric: Judgment and insight are intact. Affect is appropriate. Breast exam reveals satisfactory healing of breast conserving surgical scars over bilateral breasts.  Additionally her right axillary scar is healing well.  Lab Findings: Lab Results  Component Value Date   WBC 4.6 11/19/2021   HGB 9.7 (L) 11/19/2021   HCT 29.4 (L) 11/19/2021   MCV 97.0 11/19/2021   PLT 189 11/19/2021    Radiographic Findings: No results found.   Impression/Plan: Right breast cancer with complete pathologic response to chemotherapy  We discussed adjuvant radiotherapy today.  I recommend 3 weeks of radiation therapy to the right breast in order to minimize risk of local recurrence.  I reviewed the logistics, benefits, risks, and potential side effects of this treatment in detail. Risks may include but not necessary be limited to acute and late injury tissue in the radiation fields such as skin irritation (change in color/pigmentation, itching, dryness, pain, peeling). She may experience fatigue. We also discussed possible risk of long term cosmetic changes or scar tissue. There is also a smaller risk for  lung toxicity,  lymphedema, breast shrinkage, musculoskeletal changes, rib fragility or induction of a second malignancy, late chronic non-healing soft tissue wound.    The patient asked good questions which I answered to her satisfaction. She is enthusiastic about proceeding with treatment. A consent form has been signed and placed in her chart.  We will proceed with treatment planning today and start her treatment next week.   On date of service, in total, I spent 30 minutes on this encounter. Patient was seen in person.  _____________________________________   Eppie Gibson, MD  This document serves as a record of services  personally performed by Eppie Gibson, MD. It was created on her behalf by Roney Mans, a trained medical scribe. The creation of this record is based on the scribe's personal observations and the provider's statements to them. This document has been checked and approved by the attending provider.

## 2021-11-30 ENCOUNTER — Ambulatory Visit
Admission: RE | Admit: 2021-11-30 | Discharge: 2021-11-30 | Disposition: A | Discharge status: Home / Self Care | Payer: Federal, State, Local not specified - PPO | Source: Ambulatory Visit | Attending: Radiation Oncology | Admitting: Radiation Oncology

## 2021-11-30 ENCOUNTER — Other Ambulatory Visit: Payer: Self-pay

## 2021-11-30 ENCOUNTER — Encounter: Payer: Self-pay | Admitting: Radiation Oncology

## 2021-11-30 VITALS — BP 116/85 | HR 90 | Temp 98.3°F | Resp 18 | Ht 66.0 in | Wt 175.0 lb

## 2021-11-30 DIAGNOSIS — Z79899 Other long term (current) drug therapy: Secondary | ICD-10-CM | POA: Insufficient documentation

## 2021-11-30 DIAGNOSIS — C50411 Malignant neoplasm of upper-outer quadrant of right female breast: Secondary | ICD-10-CM | POA: Insufficient documentation

## 2021-11-30 DIAGNOSIS — Z171 Estrogen receptor negative status [ER-]: Secondary | ICD-10-CM | POA: Diagnosis not present

## 2021-12-02 ENCOUNTER — Encounter: Payer: Self-pay | Admitting: Rehabilitation

## 2021-12-02 ENCOUNTER — Ambulatory Visit: Payer: Federal, State, Local not specified - PPO | Admitting: Rehabilitation

## 2021-12-02 DIAGNOSIS — C50411 Malignant neoplasm of upper-outer quadrant of right female breast: Secondary | ICD-10-CM

## 2021-12-02 DIAGNOSIS — Z171 Estrogen receptor negative status [ER-]: Secondary | ICD-10-CM | POA: Diagnosis not present

## 2021-12-02 DIAGNOSIS — M25611 Stiffness of right shoulder, not elsewhere classified: Secondary | ICD-10-CM

## 2021-12-02 DIAGNOSIS — R293 Abnormal posture: Secondary | ICD-10-CM | POA: Diagnosis not present

## 2021-12-02 DIAGNOSIS — M25511 Pain in right shoulder: Secondary | ICD-10-CM

## 2021-12-02 NOTE — Therapy (Signed)
OUTPATIENT PHYSICAL THERAPY BREAST CANCER POST OP FOLLOW UP   Patient Name: Patricia Houston MRN: [id number] DOB:07-Jul-1965, 56 y.o., female Today's Date: 12/02/2021  END OF SESSION:  PT End of Session - 12/02/21 1000     Visit Number 3    Number of Visits 8    Date for PT Re-Evaluation 01/12/22    PT Start Time 1572    PT Stop Time 6203    PT Time Calculation (min) 43 min    Activity Tolerance Patient tolerated treatment well    Behavior During Therapy WFL for tasks assessed/performed             Past Medical History:  Diagnosis Date   Anemia    TAKES IRON PILLS   Anxiety    Cancer ([place])    right breast DCIS   Depression    Past Surgical History:  Procedure Laterality Date   ABDOMINAL HYSTERECTOMY     BREAST LUMPECTOMY WITH RADIOACTIVE SEED AND SENTINEL LYMPH NODE BIOPSY Right 11/03/2021   Procedure: RIGHT BREAST BRACKETED LUMPECTOMY WITH RADIOACTIVE SEED AND SENTINEL LYMPH NODE BIOPSY;  Surgeon: Erroll Luna, MD;  Location: [facility];  Service: General;  Laterality: Right;   BREAST LUMPECTOMY WITH RADIOACTIVE SEED LOCALIZATION Right 06/14/2018   Procedure: RIGHT BREAST RADIOACTIVE SEED LOCALIZATION LUMPECTOMY X2;  Surgeon: Erroll Luna, MD;  Location: [facility];  Service: General;  Laterality: Right;   BREAST LUMPECTOMY WITH RADIOACTIVE SEED LOCALIZATION Left 11/03/2021   Procedure: LEFT BREAST BRACKETED LUMPECTOMY WITH RADIOACTIVE SEED LOCALIZATION;  Surgeon: Erroll Luna, MD;  Location: [facility];  Service: General;  Laterality: Left;   IR IMAGING GUIDED PORT INSERTION  06/17/2021   LAPAROSCOPIC ASSISTED VAGINAL HYSTERECTOMY N/A 02/27/2014   Procedure: LAPAROSCOPIC ASSISTED VAGINAL HYSTERECTOMY;  Surgeon: Maeola Sarah. Landry Mellow, MD;  Location: [facility];  Service: Gynecology;  Laterality: N/A;  abdomen to vagina to abdomen   TONSILLECTOMY     Patient Active Problem List   Diagnosis Date Noted   Vitamin D deficiency  07/09/2021   Iron deficiency anemia 07/09/2021   Hyperlipidemia 07/09/2021   Port-A-Cath in place 06/18/2021   Malignant neoplasm of upper-outer quadrant of right breast in female, estrogen receptor negative ([place]) 05/27/2021   Atypical ductal hyperplasia of right breast 07/18/2018   Genetic testing 03/31/2018   Family history of breast cancer in mother 04/03/2015   Family history of pancreatic cancer 04/03/2015   S/P hysterectomy 02/27/2014   ANEMIA 07/01/2009    PCP: Marilynne Drivers PA   REFERRING PROVIDER:  Dr. Lindi Adie   REFERRING DIAG: Rt breast cancer   THERAPY DIAG:  Malignant neoplasm of upper-outer quadrant of right breast in female, estrogen receptor negative ([place])  Abnormal posture  Acute pain of right shoulder  Stiffness of right shoulder, not elsewhere classified  Rationale for Evaluation and Treatment: Rehabilitation  ONSET DATE: 05/19/2021  SUBJECTIVE:  SUBJECTIVE STATEMENT: I start radiation on Monday.  Dr. Marlou Starks said it looked.  Pain has gotten a lot better.  Discomfort   PERTINENT HISTORY:  Breast cancer recurrence noted 05/19/21. Hx of Rt lumpectomy x 2 in 2010 with Dr. Brantley Stage.  0/1 lymph node removed. Plan is for neoadjuvant chemotherapy TCHP followed by lumpectomy and SLNB and radiation.   PATIENT GOALS:  Reassess how my recovery is going related to arm function, pain, and swelling.  PAIN:  Are you having pain? Yes: NPRS scale: 3/10 Pain location: Right wear the sports bra sits in the armpit Pain description: dull, achy, sharp, tender to touch  Aggravating factors: moving the arm Relieving factors: Removing the compression bra   PRECAUTIONS: Recent Surgery, right UE Lymphedema risk, None  ACTIVITY LEVEL / LEISURE: ride bike, trying hand weights, have a total gym but it has  been a few months    OBJECTIVE:   PATIENT SURVEYS:  QUICK DASH: 45.45  OBSERVATIONS: Axillary cording in the right arm  Incision site healing as expected with the presence of glue   POSTURE:  Rounded shoulder posture   LYMPHEDEMA ASSESSMENT:  UPPER EXTREMITY AROM/PROM:   A/PROM RIGHT   eval   11/24/2021 12/02/21  Shoulder extension 60 55   Shoulder flexion 150 119 tight and pulling  145- upper arm pull  Shoulder abduction 165 - top of the shoulder and upper arm feels tight 121- tight and pulling  135 - upper arm pull  Shoulder internal rotation      Shoulder external rotation 91 80                           (Blank rows = not tested)   A/PROM LEFT   eval 11/24/2021  Shoulder extension 60 70  Shoulder flexion 150 151  Shoulder abduction 165 165  Shoulder internal rotation     Shoulder external rotation                             (Blank rows = not tested)   SHOULDER STRENGTH: Flexion: 5/5 bil  Abduction: 4+/5 bil with some pain on the top of the Rt shoulder ER: 5/5 IR:5/5   CERVICAL AROM: All within normal limits:    LYMPHEDEMA ASSESSMENTS:    LANDMARK RIGHT   eval 11/24/2021  10 cm proximal to olecranon process 33.5 31  Olecranon process 27 25.6  10 cm proximal to ulnar styloid process 22 21.5  Just proximal to ulnar styloid process 15.7 15.1  Across hand at thumb web space 18.5 17.6  At base of 2nd digit 5.9 5.5  (Blank rows = not tested)   LANDMARK LEFT   eval  10 cm proximal to olecranon process 33.5  Olecranon process 27  10 cm proximal to ulnar styloid process 22  Just proximal to ulnar styloid process 15.4  Across hand at thumb web space 18.7  At base of 2nd digit 6.0  (Blank rows = not tested)  Surgery type/Date: 11/03/2021 right and left  breast lumpectomy  Number of lymph nodes removed: 4 Current/past treatment (chemo, radiation, hormone therapy): chemotherapy, radiation, herceptin and perjeta  Other symptoms:  Heaviness/tightness  No Pain Yes Pitting edema No Infections No  TODAY'S TREATMENT: 12/02/21:  pulleys into flexion x 66mn with initial instruction Dowel abduction x 5 in front of the mirror Supine dowel flexion x 5 Supine chest stretch 5" x 3  MT: Cording release in the upper arm and axilla at an abduction angle that is tolerated.  PROM of the arm into flexion, abduction, and D2 MLD of the arm post release to decrease soreness.    PATIENT EDUCATION:  Education details: ABCC, compression garments, scar massage, SOZO, posture, POC and HEP Person educated: Patient Education method: Explanation and Handouts Education comprehension: verbalized understanding  HOME EXERCISE PROGRAM: Reviewed previously given post op HEP.   ASSESSMENT:  CLINICAL IMPRESSION: Pt is making improvements already and feels improvements post MT.  Cording is noted in the axilla and mid upper arm but not past elbow.    Pt will benefit from skilled therapeutic intervention to improve on the following deficits: Decreased knowledge of precautions, impaired UE functional use, pain, decreased ROM, postural dysfunction.   PT treatment/interventions: ADL/Self care home management, Therapeutic exercises, Therapeutic activity, Neuromuscular re-education, Balance training, Gait training, Patient/Family education, Self Care, Joint mobilization, Dry Needling, Manual lymph drainage, Compression bandaging, and scar mobilization   GOALS: Goals reviewed with patient? Yes  LONG TERM GOALS:  (STG=LTG)  GOALS Name Target Date  Goal status  1 Pt will demonstrate she has regained full shoulder ROM and function post operatively compared to baselines.  Baseline: 01/12/2022 INITIAL  2 Patient will report 75 % less discomfort with ROM due to cording to demonstrated improvement in function  01/12/2022 INITIAL  3 Pt will report 90% reduction of pain due to improvements in posture, strength, and muscle length  01/12/2022 INITIAL  4 Pt will be independent  with advanced HEP to maintain improvements made throughout therapy    01/12/2022 INITIAL     PLAN:  PT FREQUENCY/DURATION: 1x 6 weeks  PLAN FOR NEXT SESSION: STM to cording and gentle PROM and stretching    [facility]  [address], [address]  [place] [address]  [phone]  After Breast Cancer Class It is recommended you attend the ABC class to be educated on lymphedema risk reduction. This class is free of charge and lasts for 1 hour. It is a 1-time class. You will need to download the Webex app either on your phone or computer. We will send you a link the night before or the morning of the class. You should be able to click on that link to join the class. This is not a confidential class. You don't have to turn your camera on, but other participants may be able to see your email address.  Scar massage You can begin gentle scar massage to you incision sites. Gently place one hand on the incision and move the skin (without sliding on the skin) in various directions. Do this for a few minutes and then you can gently massage either coconut oil or vitamin E cream into the scars.  Compression garment You should continue wearing your compression bra until you feel like you no longer have swelling.  Home exercise Program Continue doing the exercises you were given until you feel like you can do them without feeling any tightness at the end.   Walking Program Studies show that 30 minutes of walking per day (fast enough to elevate your heart rate) can significantly reduce the risk of a cancer recurrence. If you can't walk due to other medical reasons, we encourage you to find another activity you could do (like a stationary bike or water exercise).  Posture After breast cancer surgery, people frequently sit with rounded shoulders posture because it puts their incisions on slack and feels better.  If you sit like this and scar tissue forms in that position, you can  become very tight and have pain sitting or standing with good posture. Try to be aware of your posture and sit and stand up tall to heal properly.  Follow up PT: It is recommended you return every 3 months for the first 3 years following surgery to be assessed on the SOZO machine for an L-Dex score. This helps prevent clinically significant lymphedema in 95% of patients. These follow up screens are 10 minute appointments that you are not billed for.   Shan Levans, PT  12/02/2021  12:01 PM

## 2021-12-03 ENCOUNTER — Inpatient Hospital Stay: Payer: Federal, State, Local not specified - PPO

## 2021-12-03 ENCOUNTER — Inpatient Hospital Stay (HOSPITAL_BASED_OUTPATIENT_CLINIC_OR_DEPARTMENT_OTHER): Payer: Federal, State, Local not specified - PPO | Admitting: Hematology and Oncology

## 2021-12-03 VITALS — BP 115/87 | HR 95 | Temp 97.9°F | Resp 18 | Ht 66.0 in | Wt 175.1 lb

## 2021-12-03 VITALS — BP 127/99 | HR 76 | Temp 98.0°F | Resp 18

## 2021-12-03 DIAGNOSIS — Z9011 Acquired absence of right breast and nipple: Secondary | ICD-10-CM | POA: Diagnosis not present

## 2021-12-03 DIAGNOSIS — Z79899 Other long term (current) drug therapy: Secondary | ICD-10-CM | POA: Diagnosis not present

## 2021-12-03 DIAGNOSIS — Z171 Estrogen receptor negative status [ER-]: Secondary | ICD-10-CM

## 2021-12-03 DIAGNOSIS — C50411 Malignant neoplasm of upper-outer quadrant of right female breast: Secondary | ICD-10-CM

## 2021-12-03 DIAGNOSIS — Z5112 Encounter for antineoplastic immunotherapy: Secondary | ICD-10-CM | POA: Diagnosis not present

## 2021-12-03 MED ORDER — TRASTUZUMAB-DKST CHEMO 150 MG IV SOLR
6.0000 mg/kg | Freq: Once | INTRAVENOUS | Status: AC
  Administered 2021-12-03: 483 mg via INTRAVENOUS
  Filled 2021-12-03: qty 23

## 2021-12-03 MED ORDER — SODIUM CHLORIDE 0.9 % IV SOLN
420.0000 mg | Freq: Once | INTRAVENOUS | Status: AC
  Administered 2021-12-03: 420 mg via INTRAVENOUS
  Filled 2021-12-03: qty 14

## 2021-12-03 MED ORDER — DIPHENHYDRAMINE HCL 25 MG PO CAPS
25.0000 mg | ORAL_CAPSULE | Freq: Once | ORAL | Status: AC
  Administered 2021-12-03: 25 mg via ORAL
  Filled 2021-12-03: qty 1

## 2021-12-03 MED ORDER — SODIUM CHLORIDE 0.9 % IV SOLN: Freq: Once | INTRAVENOUS | Status: AC

## 2021-12-03 MED ORDER — SODIUM CHLORIDE 0.9% FLUSH
10.0000 mL | INTRAVENOUS | Status: DC | PRN
  Administered 2021-12-03: 10 mL

## 2021-12-03 MED ORDER — HEPARIN SOD (PORK) LOCK FLUSH 100 UNIT/ML IV SOLN
500.0000 [IU] | Freq: Once | INTRAVENOUS | Status: AC | PRN
  Administered 2021-12-03: 500 [IU]

## 2021-12-03 MED ORDER — ACETAMINOPHEN 325 MG PO TABS
650.0000 mg | ORAL_TABLET | Freq: Once | ORAL | Status: AC
  Administered 2021-12-03: 650 mg via ORAL
  Filled 2021-12-03: qty 2

## 2021-12-03 NOTE — Progress Notes (Addendum)
Patient Care Team: Lois Huxley, PA as PCP - General (Family Medicine) Mauro Kaufmann, RN as Oncology Nurse Navigator Rockwell Germany, RN as Oncology Nurse Navigator Erroll Luna, MD as Consulting Physician (General Surgery) Nicholas Lose, MD as Consulting Physician (Hematology and Oncology) Eppie Gibson, MD as Attending Physician (Radiation Oncology)  DIAGNOSIS:  Encounter Diagnosis  Name Primary?   Malignant neoplasm of upper-outer quadrant of right breast in female, estrogen receptor negative ([place]) Yes    SUMMARY OF ONCOLOGIC HISTORY: Oncology History  Malignant neoplasm of upper-outer quadrant of right breast in female, estrogen receptor negative ([place])  06/14/2018 Surgery   06/14/2018: Right lumpectomy: Intraductal papilloma with usual ductal hyperplasia, 1 benign lymph node (decided against tamoxifen)   05/19/2021 Relapse/Recurrence   Screening mammogram detected right breast density.  2 lymph nodes were noted biopsy appointment was benign.  Ultrasound revealed 2.2 cm mass at 11 o'clock position right breast biopsy: Grade 3 IDC with necrosis, ER 0%, PR 0%, Ki-67 60%, HER2 3+ positive by [facility]   05/27/2021 Cancer Staging   Staging form: Breast, AJCC 8th Edition - Clinical: Stage IIA (cT2, cN0, cM0, G3, ER-, PR-, HER2+) - Signed by Nicholas Lose, MD on 05/27/2021 Stage prefix: Initial diagnosis Histologic grading system: 3 grade system   06/18/2021 - 08/22/2021 Chemotherapy   Patient is on Treatment Plan : BREAST  Docetaxel + Carboplatin + Trastuzumab + Pertuzumab  (TCHP) q21d      06/18/2021 -  Chemotherapy   Patient is on Treatment Plan : BREAST  Docetaxel + Carboplatin + Trastuzumab + Pertuzumab  (TCHP) q21d / Trastuzumab + Pertuzumab q21d       CHIEF COMPLIANT: Follow-right breast cancer on herceptin maintenance  INTERVAL HISTORY: Patricia Houston is a 56 y.o. female is here because of recent diagnosis of right breast cancer. She presents to the clinic today for a  follow-up. She states that she seems tired then moe than before. Bowels not to bad. She denies no nausea.   ALLERGIES:  has No Known Allergies.  MEDICATIONS:  Current Outpatient Medications  Medication Sig Dispense Refill   Cetirizine HCl (ZYRTEC PO) Take by mouth.     cholecalciferol (VITAMIN D3) 25 MCG (1000 UNIT) tablet Take 1 tablet (1,000 Units total) by mouth daily.     Cyanocobalamin (VITAMIN B-12) 3000 MCG SUBL Place 1 tablet under the tongue daily.     ferrous sulfate 325 (65 FE) MG tablet      ibuprofen (ADVIL) 200 MG tablet Take 200 mg by mouth as needed for moderate pain.     No current facility-administered medications for this visit.   Facility-Administered Medications Ordered in Other Visits  Medication Dose Route Frequency Provider Last Rate Last Admin   acetaminophen (TYLENOL) tablet 650 mg  650 mg Oral Once Nicholas Lose, MD       diphenhydrAMINE (BENADRYL) capsule 25 mg  25 mg Oral Once Nicholas Lose, MD       heparin lock flush 100 unit/mL  500 Units Intracatheter Once PRN Nicholas Lose, MD       pertuzumab (PERJETA) 420 mg in sodium chloride 0.9 % 250 mL chemo infusion  420 mg Intravenous Once Nicholas Lose, MD       sodium chloride flush (NS) 0.9 % injection 10 mL  10 mL Intracatheter PRN Nicholas Lose, MD       trastuzumab-dkst (OGIVRI) 483 mg in sodium chloride 0.9 % 250 mL chemo infusion  6 mg/kg (Treatment Plan Recorded) Intravenous Once Nicholas Lose,  MD        PHYSICAL EXAMINATION: ECOG PERFORMANCE STATUS: 1 - Symptomatic but completely ambulatory  Vitals:   12/03/21 0831  BP: 115/87  Pulse: 95  Resp: 18  Temp: 97.9 F (36.6 C)  SpO2: 100%   Filed Weights   12/03/21 0831  Weight: 175 lb 1.6 oz (79.4 kg)      LABORATORY DATA:  I have reviewed the data as listed    Latest Ref Rng & Units 11/19/2021   12:30 PM 10/23/2021    8:26 AM 10/01/2021    9:02 AM  CMP  Glucose 70 - 99 mg/dL 83  105  143   BUN 6 - 20 mg/dL 18  11  20   Creatinine  0.44 - 1.00 mg/dL 1.13  1.00  1.01   Sodium 135 - 145 mmol/L 139  139  137   Potassium 3.5 - 5.1 mmol/L 4.0  4.0  4.0   Chloride 98 - 111 mmol/L 104  106  102   CO2 22 - 32 mmol/L 29  28  28   Calcium 8.9 - 10.3 mg/dL 10.5  9.0  9.4   Total Protein 6.5 - 8.1 g/dL 7.5  6.2  7.0   Total Bilirubin 0.3 - 1.2 mg/dL 0.3  0.3  0.3   Alkaline Phos 38 - 126 U/L 93  85  95   AST 15 - 41 U/L 20  22  22   ALT 0 - 44 U/L 29  30  34     Lab Results  Component Value Date   WBC 4.6 11/19/2021   HGB 9.7 (L) 11/19/2021   HCT 29.4 (L) 11/19/2021   MCV 97.0 11/19/2021   PLT 189 11/19/2021   NEUTROABS 2.3 11/19/2021    ASSESSMENT & PLAN:  Malignant neoplasm of upper-outer quadrant of right breast in female, estrogen receptor negative ([facility]) 06/14/2018: Right lumpectomy: Intraductal papilloma with usual ductal hyperplasia, 1 benign lymph node (decided against tamoxifen) 05/19/2021: Screening mammogram detected right breast density.  2 lymph nodes were noted biopsy appointment was benign.  Ultrasound revealed 2.2 cm mass at 11 o'clock position right breast biopsy: Grade 3 IDC with necrosis, ER 0%, PR 0%, Ki-67 60%, HER2 3+ positive by IHC   Treatment Plan: 1. Neoadjuvant chemotherapy with TCH Perjeta 6 cycles followed by Herceptin Perjeta maintenance versus Kadcyla maintenance (based on response to neoadjuvant chemo) for 1 year 2. 11/03/2021: Right lumpectomy: Pathologic complete response, 0/4 lymph nodes, margins negative 3. Followed by adjuvant radiation therapy ------------------------------------------------------------------------------------------------------------------------------- Treatment plan: Continue with Herceptin Perjeta maintenance and adjuvant radiation. Radiation starting soon.  Return to clinic every 3 weeks for Herceptin and Perjeta and every 6 weeks for follow-up with me.   No orders of the defined types were placed in this encounter.  The patient has a good understanding of the  overall plan. she agrees with it. she will call with any problems that may develop before the next visit here. Total time spent: 30 mins including face to face time and time spent for planning, charting and co-ordination of care   Viinay K Gudena, MD 12/03/21    I Deritra, Mcnairy am scribing for Dr. Gudena  I have reviewed the above documentation for accuracy and completeness, and I agree with the above.  Addendum: Patient was scheduled 1 week earlier.  I recommended that we proceed with today's treatment.  

## 2021-12-03 NOTE — Progress Notes (Signed)
Patient declined to stay post observation pertuzumab. Patient discharged ambulatory- VSS

## 2021-12-03 NOTE — Patient Instructions (Signed)
[facility] ONCOL[facility]   Discharge Instructions: Thank you for choosing [facility] to provide your oncology and hematology care.   If you have a lab appointment with the [facility], please go directly to the [facility] and check in at the registration area.   Wear comfortable clothing and clothing appropriate for easy access to any Portacath or PICC line.   We strive to give you quality time with your provider. You may need to reschedule your appointment if you arrive late (15 or more minutes).  Arriving late affects you and other patients whose appointments are after yours.  Also, if you miss three or more appointments without notifying the office, you may be dismissed from the clinic at the provider's discretion.      For prescription refill requests, have your pharmacy contact our office and allow 72 hours for refills to be completed.    Today you received the following chemotherapy and/or immunotherapy agents: Trastuzumab (Herceptin) and Pertuzumab (Perjeta)      To help prevent nausea and vomiting after your treatment, we encourage you to take your nausea medication as directed.  BELOW ARE SYMPTOMS THAT SHOULD BE REPORTED IMMEDIATELY: *FEVER GREATER THAN 100.4 F (38 C) OR HIGHER *CHILLS OR SWEATING *NAUSEA AND VOMITING THAT IS NOT CONTROLLED WITH YOUR NAUSEA MEDICATION *UNUSUAL SHORTNESS OF BREATH *UNUSUAL BRUISING OR BLEEDING *URINARY PROBLEMS (pain or burning when urinating, or frequent urination) *BOWEL PROBLEMS (unusual diarrhea, constipation, pain near the anus) TENDERNESS IN MOUTH AND THROAT WITH OR WITHOUT PRESENCE OF ULCERS (sore throat, sores in mouth, or a toothache) UNUSUAL RASH, SWELLING OR PAIN  UNUSUAL VAGINAL DISCHARGE OR ITCHING   Items with * indicate a potential emergency and should be followed up as soon as possible or go to the Emergency Department if any problems should occur.  Please show the CHEMOTHERAPY ALERT CARD or  IMMUNOTHERAPY ALERT CARD at check-in to the Emergency Department and triage nurse.  Should you have questions after your visit or need to cancel or reschedule your appointment, please contact [facility]  Dept: [phone]  and follow the prompts.  Office hours are 8:00 a.m. to 4:30 p.m. Monday - Friday. Please note that voicemails left after 4:00 p.m. may not be returned until the following business day.  We are closed weekends and major holidays. You have access to a nurse at all times for urgent questions. Please call the main number to the clinic Dept: [phone] and follow the prompts.   For any non-urgent questions, you may also contact your provider using MyChart. We now offer e-Visits for anyone 55 and older to request care online for non-urgent symptoms. For details visit [email or web address].   Also download the MyChart app! Go to the app store, search "MyChart", open the app, select [facility], and log in with your MyChart username and password.  Masks are optional in the cancer centers. If you would like for your care team to wear a mask while they are taking care of you, please let them know. You may have one support person who is at least 54 years old accompany you for your appointments.

## 2021-12-03 NOTE — Assessment & Plan Note (Signed)
06/14/2018: Right lumpectomy: Intraductal papilloma with usual ductal hyperplasia, 1 benign lymph node (decided against tamoxifen) 05/19/2021: Screening mammogram detected right breast density.  2 lymph nodes were noted biopsy appointment was benign.  Ultrasound revealed 2.2 cm mass at 11 o'clock position right breast biopsy: Grade 3 IDC with necrosis, ER 0%, PR 0%, Ki-67 60%, HER2 3+ positive by IHC   Treatment Plan: 1. Neoadjuvant chemotherapy with TCH Perjeta 6 cycles followed by Herceptin Perjeta maintenance versus Kadcyla maintenance (based on response to neoadjuvant chemo) for 1 year 2. 11/03/2021: Right lumpectomy: Pathologic complete response, 0/4 lymph nodes, margins negative 3. Followed by adjuvant radiation therapy ------------------------------------------------------------------------------------------------------------------------------- Treatment plan: Continue with Herceptin Perjeta maintenance and adjuvant radiation. Radiation starting soon.  Return to clinic every 3 weeks for Herceptin and Perjeta and every 6 weeks for follow-up with me.

## 2021-12-07 ENCOUNTER — Ambulatory Visit
Admission: RE | Admit: 2021-12-07 | Discharge: 2021-12-07 | Disposition: A | Discharge status: Home / Self Care | Payer: Federal, State, Local not specified - PPO | Source: Ambulatory Visit | Attending: Radiation Oncology | Admitting: Radiation Oncology

## 2021-12-07 ENCOUNTER — Other Ambulatory Visit: Payer: Self-pay

## 2021-12-07 ENCOUNTER — Encounter: Payer: Self-pay | Admitting: *Deleted

## 2021-12-07 DIAGNOSIS — Z171 Estrogen receptor negative status [ER-]: Secondary | ICD-10-CM | POA: Insufficient documentation

## 2021-12-07 DIAGNOSIS — C50411 Malignant neoplasm of upper-outer quadrant of right female breast: Secondary | ICD-10-CM | POA: Diagnosis not present

## 2021-12-07 DIAGNOSIS — Z5112 Encounter for antineoplastic immunotherapy: Secondary | ICD-10-CM | POA: Diagnosis not present

## 2021-12-07 DIAGNOSIS — Z510 Encounter for antineoplastic radiation therapy: Secondary | ICD-10-CM | POA: Diagnosis not present

## 2021-12-07 LAB — RAD ONC ARIA SESSION SUMMARY
Course Elapsed Days: 0
Plan Fractions Treated to Date: 1
Plan Prescribed Dose Per Fraction: 2.67 Gy
Plan Total Fractions Prescribed: 15
Plan Total Prescribed Dose: 40.05 Gy
Reference Point Dosage Given to Date: 2.67 Gy
Reference Point Session Dosage Given: 2.67 Gy
Session Number: 1

## 2021-12-07 MED ORDER — ALRA NON-METALLIC DEODORANT (RAD-ONC): 1.0000 | Freq: Once | TOPICAL | Status: DC

## 2021-12-07 MED ORDER — RADIAPLEXRX EX GEL: Freq: Once | CUTANEOUS | Status: DC

## 2021-12-08 ENCOUNTER — Other Ambulatory Visit: Payer: Self-pay

## 2021-12-08 ENCOUNTER — Encounter: Payer: Self-pay | Admitting: Rehabilitation

## 2021-12-08 ENCOUNTER — Ambulatory Visit: Payer: Federal, State, Local not specified - PPO | Attending: Hematology and Oncology | Admitting: Rehabilitation

## 2021-12-08 ENCOUNTER — Ambulatory Visit
Admission: RE | Admit: 2021-12-08 | Discharge: 2021-12-08 | Disposition: A | Discharge status: Home / Self Care | Payer: Federal, State, Local not specified - PPO | Source: Ambulatory Visit | Attending: Radiation Oncology | Admitting: Radiation Oncology

## 2021-12-08 DIAGNOSIS — Z510 Encounter for antineoplastic radiation therapy: Secondary | ICD-10-CM | POA: Diagnosis not present

## 2021-12-08 DIAGNOSIS — R293 Abnormal posture: Secondary | ICD-10-CM | POA: Diagnosis not present

## 2021-12-08 DIAGNOSIS — M25611 Stiffness of right shoulder, not elsewhere classified: Secondary | ICD-10-CM | POA: Insufficient documentation

## 2021-12-08 DIAGNOSIS — Z5112 Encounter for antineoplastic immunotherapy: Secondary | ICD-10-CM | POA: Diagnosis not present

## 2021-12-08 DIAGNOSIS — Z171 Estrogen receptor negative status [ER-]: Secondary | ICD-10-CM | POA: Diagnosis not present

## 2021-12-08 DIAGNOSIS — C50411 Malignant neoplasm of upper-outer quadrant of right female breast: Secondary | ICD-10-CM | POA: Insufficient documentation

## 2021-12-08 DIAGNOSIS — M25511 Pain in right shoulder: Secondary | ICD-10-CM | POA: Insufficient documentation

## 2021-12-08 LAB — RAD ONC ARIA SESSION SUMMARY
Course Elapsed Days: 1
Plan Fractions Treated to Date: 2
Plan Prescribed Dose Per Fraction: 2.67 Gy
Plan Total Fractions Prescribed: 15
Plan Total Prescribed Dose: 40.05 Gy
Reference Point Dosage Given to Date: 5.34 Gy
Reference Point Session Dosage Given: 2.67 Gy
Session Number: 2

## 2021-12-08 NOTE — Therapy (Signed)
OUTPATIENT PHYSICAL THERAPY BREAST CANCER TREATMENT   Patient Name: Patricia Houston MRN: [id number] DOB:06-19-1965, 56 y.o., female Today's Date: 12/08/2021  END OF SESSION:  PT End of Session - 12/08/21 0906     Visit Number 4    Number of Visits 8    Date for PT Re-Evaluation 01/12/22    PT Start Time 0908    PT Stop Time 0958    PT Time Calculation (min) 50 min    Activity Tolerance Patient tolerated treatment well    Behavior During Therapy WFL for tasks assessed/performed              Past Medical History:  Diagnosis Date   Anemia    TAKES IRON PILLS   Anxiety    Cancer ([place])    right breast DCIS   Depression    Past Surgical History:  Procedure Laterality Date   ABDOMINAL HYSTERECTOMY     BREAST LUMPECTOMY WITH RADIOACTIVE SEED AND SENTINEL LYMPH NODE BIOPSY Right 11/03/2021   Procedure: RIGHT BREAST BRACKETED LUMPECTOMY WITH RADIOACTIVE SEED AND SENTINEL LYMPH NODE BIOPSY;  Surgeon: Erroll Luna, MD;  Location: [facility];  Service: General;  Laterality: Right;   BREAST LUMPECTOMY WITH RADIOACTIVE SEED LOCALIZATION Right 06/14/2018   Procedure: RIGHT BREAST RADIOACTIVE SEED LOCALIZATION LUMPECTOMY X2;  Surgeon: Erroll Luna, MD;  Location: [facility];  Service: General;  Laterality: Right;   BREAST LUMPECTOMY WITH RADIOACTIVE SEED LOCALIZATION Left 11/03/2021   Procedure: LEFT BREAST BRACKETED LUMPECTOMY WITH RADIOACTIVE SEED LOCALIZATION;  Surgeon: Erroll Luna, MD;  Location: [facility];  Service: General;  Laterality: Left;   IR IMAGING GUIDED PORT INSERTION  06/17/2021   LAPAROSCOPIC ASSISTED VAGINAL HYSTERECTOMY N/A 02/27/2014   Procedure: LAPAROSCOPIC ASSISTED VAGINAL HYSTERECTOMY;  Surgeon: Maeola Sarah. Landry Mellow, MD;  Location: [facility];  Service: Gynecology;  Laterality: N/A;  abdomen to vagina to abdomen   TONSILLECTOMY     Patient Active Problem List   Diagnosis Date Noted   Vitamin D deficiency 07/09/2021    Iron deficiency anemia 07/09/2021   Hyperlipidemia 07/09/2021   Port-A-Cath in place 06/18/2021   Malignant neoplasm of upper-outer quadrant of right breast in female, estrogen receptor negative ([place]) 05/27/2021   Atypical ductal hyperplasia of right breast 07/18/2018   Genetic testing 03/31/2018   Family history of breast cancer in mother 04/03/2015   Family history of pancreatic cancer 04/03/2015   S/P hysterectomy 02/27/2014   ANEMIA 07/01/2009    PCP: Marilynne Drivers PA   REFERRING PROVIDER:  Dr. Lindi Adie   REFERRING DIAG: Rt breast cancer   THERAPY DIAG:  Malignant neoplasm of upper-outer quadrant of right breast in female, estrogen receptor negative ([place])  Acute pain of right shoulder  Stiffness of right shoulder, not elsewhere classified  Abnormal posture  Rationale for Evaluation and Treatment: Rehabilitation  ONSET DATE: 05/19/2021  SUBJECTIVE:  SUBJECTIVE STATEMENT: The radiation first day went well.    PERTINENT HISTORY:  Breast cancer recurrence noted 05/19/21. Hx of Rt lumpectomy x 2 in 2010 with Dr. Brantley Stage.  0/1 lymph node removed. Plan is for neoadjuvant chemotherapy TCHP followed by lumpectomy and SLNB and radiation.   PATIENT GOALS:  Reassess how my recovery is going related to arm function, pain, and swelling.  PAIN:  Are you having pain? Yes: NPRS scale: 2/10 Pain location: Right wear the sports bra sits in the armpit Pain description: dull, achy, sharp, tender to touch  Aggravating factors: moving the arm Relieving factors: Removing the compression bra   PRECAUTIONS: Recent Surgery, right UE Lymphedema risk, None  ACTIVITY LEVEL / LEISURE: ride bike, trying hand weights, have a total gym but it has been a few months    OBJECTIVE:   PATIENT SURVEYS:  QUICK  DASH: 45.45  OBSERVATIONS: Axillary cording in the right arm  Incision site healing as expected with the presence of glue   POSTURE:  Rounded shoulder posture   LYMPHEDEMA ASSESSMENT:  UPPER EXTREMITY AROM/PROM:   A/PROM RIGHT   eval   11/24/2021 12/02/21  Shoulder extension 60 55   Shoulder flexion 150 119 tight and pulling  145- upper arm pull  Shoulder abduction 165 - top of the shoulder and upper arm feels tight 121- tight and pulling  135 - upper arm pull  Shoulder internal rotation      Shoulder external rotation 91 80                           (Blank rows = not tested)   A/PROM LEFT   eval 11/24/2021  Shoulder extension 60 70  Shoulder flexion 150 151  Shoulder abduction 165 165  Shoulder internal rotation     Shoulder external rotation                             (Blank rows = not tested)   SHOULDER STRENGTH: Flexion: 5/5 bil  Abduction: 4+/5 bil with some pain on the top of the Rt shoulder ER: 5/5 IR:5/5   CERVICAL AROM: All within normal limits:    LYMPHEDEMA ASSESSMENTS:    LANDMARK RIGHT   eval 11/24/2021  10 cm proximal to olecranon process 33.5 31  Olecranon process 27 25.6  10 cm proximal to ulnar styloid process 22 21.5  Just proximal to ulnar styloid process 15.7 15.1  Across hand at thumb web space 18.5 17.6  At base of 2nd digit 5.9 5.5  (Blank rows = not tested)   LANDMARK LEFT   eval  10 cm proximal to olecranon process 33.5  Olecranon process 27  10 cm proximal to ulnar styloid process 22  Just proximal to ulnar styloid process 15.4  Across hand at thumb web space 18.7  At base of 2nd digit 6.0  (Blank rows = not tested)  Surgery type/Date: 11/03/2021 right and left  breast lumpectomy  Number of lymph nodes removed: 4 Current/past treatment (chemo, radiation, hormone therapy): chemotherapy, radiation, herceptin and perjeta   TODAY'S TREATMENT: 12/08/21:  pulleys into flexion x 40mn with initial instruction Supine dowel flexion  x 10 Supine chest stretch 5" x 3 LTR arms out to the side at around 90deg 6" x5 each   MT: Cording release in the upper arm and axilla at an abduction angle that is tolerated.  PROM of the arm  into flexion, abduction, and D2 MLD of the arm post release to decrease soreness.   Gave tg soft medium with large upper arm fold for comfort trial  12/02/21:  pulleys into flexion x 57mn with initial instruction Dowel abduction x 5 in front of the mirror Supine dowel flexion x 5 Supine chest stretch x 10 with active in and out movement MT: Cording release in the upper arm and axilla at an abduction angle that is tolerated.  PROM of the arm into flexion, abduction, and D2 MLD of the arm post release to decrease soreness.    PATIENT EDUCATION:  Education details: ABCC, compression garments, scar massage, SOZO, posture, POC and HEP Person educated: Patient Education method: Explanation and Handouts Education comprehension: verbalized understanding  HOME EXERCISE PROGRAM: Reviewed previously given post op HEP.   ASSESSMENT: CLINICAL IMPRESSION:   Pt is improving ROM but still having significant cording related pain and limitations.  Pt is doing well with radiation position.   Pt will benefit from skilled therapeutic intervention to improve on the following deficits: Decreased knowledge of precautions, impaired UE functional use, pain, decreased ROM, postural dysfunction.   PT treatment/interventions: ADL/Self care home management, Therapeutic exercises, Therapeutic activity, Neuromuscular re-education, Balance training, Gait training, Patient/Family education, Self Care, Joint mobilization, Dry Needling, Manual lymph drainage, Compression bandaging, and scar mobilization   GOALS: Goals reviewed with patient? Yes  LONG TERM GOALS:  (STG=LTG)  GOALS Name Target Date  Goal status  1 Pt will demonstrate she has regained full shoulder ROM and function post operatively compared to baselines.   Baseline: 01/12/2022 INITIAL  2 Patient will report 75 % less discomfort with ROM due to cording to demonstrated improvement in function  01/12/2022 INITIAL  3 Pt will report 90% reduction of pain due to improvements in posture, strength, and muscle length  01/12/2022 INITIAL  4 Pt will be independent with advanced HEP to maintain improvements made throughout therapy    01/12/2022 INITIAL     PLAN:  PT FREQUENCY/DURATION: 1x 6 weeks  PLAN FOR NEXT SESSION: STM to cording and gentle PROM and stretching    [facility]  [address]  [id number] [phone] After Breast Cancer Class It is recommended you attend the ABC class to be educated on lymphedema risk reduction. This class is free of charge and lasts for 1 hour. It is a 1-time class. You will need to download the Webex app either on your phone or computer. We will send you a link the night before or the morning of the class. You should be able to click on that link to join the class. This is not a confidential class. You don't have to turn your camera on, but other participants may be able to see your email address.  Scar massage You can begin gentle scar massage to you incision sites. Gently place one hand on the incision and move the skin (without sliding on the skin) in various directions. Do this for a few minutes and then you can gently massage either coconut oil or vitamin E cream into the scars.  Compression garment You should continue wearing your compression bra until you feel like you no longer have swelling.  Home exercise Program Continue doing the exercises you were given until you feel like you can do them without feeling any tightness at the end.   Walking Program Studies show that 30 minutes of walking per day (fast enough to elevate your  heart rate) can significantly reduce the risk of a cancer recurrence. If you can't walk due to other medical reasons, we encourage you to  find another activity you could do (like a stationary bike or water exercise).  Posture After breast cancer surgery, people frequently sit with rounded shoulders posture because it puts their incisions on slack and feels better. If you sit like this and scar tissue forms in that position, you can become very tight and have pain sitting or standing with good posture. Try to be aware of your posture and sit and stand up tall to heal properly.  Follow up PT: It is recommended you return every 3 months for the first 3 years following surgery to be assessed on the SOZO machine for an L-Dex score. This helps prevent clinically significant lymphedema in 95% of patients. These follow up screens are 10 minute appointments that you are not billed for.   Shan Levans, PT  12/08/2021  9:58 AM

## 2021-12-09 ENCOUNTER — Ambulatory Visit
Admission: RE | Admit: 2021-12-09 | Discharge: 2021-12-09 | Disposition: A | Discharge status: Home / Self Care | Payer: Federal, State, Local not specified - PPO | Source: Ambulatory Visit | Attending: Radiation Oncology | Admitting: Radiation Oncology

## 2021-12-09 ENCOUNTER — Other Ambulatory Visit: Payer: Self-pay

## 2021-12-09 DIAGNOSIS — Z510 Encounter for antineoplastic radiation therapy: Secondary | ICD-10-CM | POA: Diagnosis not present

## 2021-12-09 DIAGNOSIS — Z171 Estrogen receptor negative status [ER-]: Secondary | ICD-10-CM | POA: Diagnosis not present

## 2021-12-09 DIAGNOSIS — C50411 Malignant neoplasm of upper-outer quadrant of right female breast: Secondary | ICD-10-CM | POA: Diagnosis not present

## 2021-12-09 DIAGNOSIS — Z5112 Encounter for antineoplastic immunotherapy: Secondary | ICD-10-CM | POA: Diagnosis not present

## 2021-12-09 LAB — RAD ONC ARIA SESSION SUMMARY
Course Elapsed Days: 2
Plan Fractions Treated to Date: 3
Plan Prescribed Dose Per Fraction: 2.67 Gy
Plan Total Fractions Prescribed: 15
Plan Total Prescribed Dose: 40.05 Gy
Reference Point Dosage Given to Date: 8.01 Gy
Reference Point Session Dosage Given: 2.67 Gy
Session Number: 3

## 2021-12-10 ENCOUNTER — Ambulatory Visit
Admission: RE | Admit: 2021-12-10 | Discharge: 2021-12-10 | Disposition: A | Discharge status: Home / Self Care | Payer: Federal, State, Local not specified - PPO | Source: Ambulatory Visit | Attending: Radiation Oncology | Admitting: Radiation Oncology

## 2021-12-10 ENCOUNTER — Other Ambulatory Visit: Payer: Self-pay

## 2021-12-10 DIAGNOSIS — Z510 Encounter for antineoplastic radiation therapy: Secondary | ICD-10-CM | POA: Diagnosis not present

## 2021-12-10 DIAGNOSIS — Z5112 Encounter for antineoplastic immunotherapy: Secondary | ICD-10-CM | POA: Diagnosis not present

## 2021-12-10 DIAGNOSIS — Z171 Estrogen receptor negative status [ER-]: Secondary | ICD-10-CM | POA: Diagnosis not present

## 2021-12-10 DIAGNOSIS — C50411 Malignant neoplasm of upper-outer quadrant of right female breast: Secondary | ICD-10-CM | POA: Diagnosis not present

## 2021-12-10 LAB — RAD ONC ARIA SESSION SUMMARY
Course Elapsed Days: 3
Plan Fractions Treated to Date: 4
Plan Prescribed Dose Per Fraction: 2.67 Gy
Plan Total Fractions Prescribed: 15
Plan Total Prescribed Dose: 40.05 Gy
Reference Point Dosage Given to Date: 10.68 Gy
Reference Point Session Dosage Given: 2.67 Gy
Session Number: 4

## 2021-12-11 ENCOUNTER — Other Ambulatory Visit: Payer: Self-pay

## 2021-12-11 ENCOUNTER — Ambulatory Visit
Admission: RE | Admit: 2021-12-11 | Discharge: 2021-12-11 | Disposition: A | Discharge status: Home / Self Care | Payer: Federal, State, Local not specified - PPO | Source: Ambulatory Visit | Attending: Radiation Oncology | Admitting: Radiation Oncology

## 2021-12-11 DIAGNOSIS — Z171 Estrogen receptor negative status [ER-]: Secondary | ICD-10-CM | POA: Diagnosis not present

## 2021-12-11 DIAGNOSIS — C50411 Malignant neoplasm of upper-outer quadrant of right female breast: Secondary | ICD-10-CM | POA: Diagnosis not present

## 2021-12-11 DIAGNOSIS — Z5112 Encounter for antineoplastic immunotherapy: Secondary | ICD-10-CM | POA: Diagnosis not present

## 2021-12-11 DIAGNOSIS — Z510 Encounter for antineoplastic radiation therapy: Secondary | ICD-10-CM | POA: Diagnosis not present

## 2021-12-11 LAB — RAD ONC ARIA SESSION SUMMARY
Course Elapsed Days: 4
Plan Fractions Treated to Date: 5
Plan Prescribed Dose Per Fraction: 2.67 Gy
Plan Total Fractions Prescribed: 15
Plan Total Prescribed Dose: 40.05 Gy
Reference Point Dosage Given to Date: 13.35 Gy
Reference Point Session Dosage Given: 2.67 Gy
Session Number: 5

## 2021-12-14 ENCOUNTER — Other Ambulatory Visit: Payer: Self-pay

## 2021-12-14 ENCOUNTER — Ambulatory Visit
Admission: RE | Admit: 2021-12-14 | Discharge: 2021-12-14 | Disposition: A | Discharge status: Home / Self Care | Payer: Federal, State, Local not specified - PPO | Source: Ambulatory Visit | Attending: Radiation Oncology | Admitting: Radiation Oncology

## 2021-12-14 DIAGNOSIS — Z171 Estrogen receptor negative status [ER-]: Secondary | ICD-10-CM | POA: Diagnosis not present

## 2021-12-14 DIAGNOSIS — Z510 Encounter for antineoplastic radiation therapy: Secondary | ICD-10-CM | POA: Diagnosis not present

## 2021-12-14 DIAGNOSIS — C50411 Malignant neoplasm of upper-outer quadrant of right female breast: Secondary | ICD-10-CM | POA: Diagnosis not present

## 2021-12-14 DIAGNOSIS — Z5112 Encounter for antineoplastic immunotherapy: Secondary | ICD-10-CM | POA: Diagnosis not present

## 2021-12-14 LAB — RAD ONC ARIA SESSION SUMMARY
Course Elapsed Days: 7
Plan Fractions Treated to Date: 6
Plan Prescribed Dose Per Fraction: 2.67 Gy
Plan Total Fractions Prescribed: 15
Plan Total Prescribed Dose: 40.05 Gy
Reference Point Dosage Given to Date: 16.02 Gy
Reference Point Session Dosage Given: 2.67 Gy
Session Number: 6

## 2021-12-15 ENCOUNTER — Other Ambulatory Visit: Payer: Self-pay

## 2021-12-15 ENCOUNTER — Ambulatory Visit
Admission: RE | Admit: 2021-12-15 | Discharge: 2021-12-15 | Disposition: A | Discharge status: Home / Self Care | Payer: Federal, State, Local not specified - PPO | Source: Ambulatory Visit | Attending: Radiation Oncology | Admitting: Radiation Oncology

## 2021-12-15 ENCOUNTER — Ambulatory Visit: Payer: Federal, State, Local not specified - PPO

## 2021-12-15 DIAGNOSIS — Z9012 Acquired absence of left breast and nipple: Secondary | ICD-10-CM | POA: Diagnosis not present

## 2021-12-15 DIAGNOSIS — R293 Abnormal posture: Secondary | ICD-10-CM

## 2021-12-15 DIAGNOSIS — Z5112 Encounter for antineoplastic immunotherapy: Secondary | ICD-10-CM | POA: Diagnosis not present

## 2021-12-15 DIAGNOSIS — Z171 Estrogen receptor negative status [ER-]: Secondary | ICD-10-CM | POA: Diagnosis not present

## 2021-12-15 DIAGNOSIS — C50911 Malignant neoplasm of unspecified site of right female breast: Secondary | ICD-10-CM | POA: Diagnosis not present

## 2021-12-15 DIAGNOSIS — M25511 Pain in right shoulder: Secondary | ICD-10-CM

## 2021-12-15 DIAGNOSIS — M25611 Stiffness of right shoulder, not elsewhere classified: Secondary | ICD-10-CM

## 2021-12-15 DIAGNOSIS — Z510 Encounter for antineoplastic radiation therapy: Secondary | ICD-10-CM | POA: Diagnosis not present

## 2021-12-15 DIAGNOSIS — C50411 Malignant neoplasm of upper-outer quadrant of right female breast: Secondary | ICD-10-CM | POA: Diagnosis not present

## 2021-12-15 LAB — RAD ONC ARIA SESSION SUMMARY
Course Elapsed Days: 8
Plan Fractions Treated to Date: 7
Plan Prescribed Dose Per Fraction: 2.67 Gy
Plan Total Fractions Prescribed: 15
Plan Total Prescribed Dose: 40.05 Gy
Reference Point Dosage Given to Date: 18.69 Gy
Reference Point Session Dosage Given: 2.67 Gy
Session Number: 7

## 2021-12-15 NOTE — Therapy (Signed)
OUTPATIENT PHYSICAL THERAPY BREAST CANCER TREATMENT   Patient Name: Patricia Houston MRN: [id number] DOB:Nov 07, 1965, 56 y.o., female Today's Date: 12/15/2021  END OF SESSION:  PT End of Session - 12/15/21 1112     Visit Number 5    Number of Visits 8    Date for PT Re-Evaluation 01/12/22    PT Start Time 1106    PT Stop Time 1205    PT Time Calculation (min) 59 min    Activity Tolerance Patient tolerated treatment well    Behavior During Therapy WFL for tasks assessed/performed              Past Medical History:  Diagnosis Date   Anemia    TAKES IRON PILLS   Anxiety    Cancer ([place])    right breast DCIS   Depression    Past Surgical History:  Procedure Laterality Date   ABDOMINAL HYSTERECTOMY     BREAST LUMPECTOMY WITH RADIOACTIVE SEED AND SENTINEL LYMPH NODE BIOPSY Right 11/03/2021   Procedure: RIGHT BREAST BRACKETED LUMPECTOMY WITH RADIOACTIVE SEED AND SENTINEL LYMPH NODE BIOPSY;  Surgeon: Erroll Luna, MD;  Location: [facility];  Service: General;  Laterality: Right;   BREAST LUMPECTOMY WITH RADIOACTIVE SEED LOCALIZATION Right 06/14/2018   Procedure: RIGHT BREAST RADIOACTIVE SEED LOCALIZATION LUMPECTOMY X2;  Surgeon: Erroll Luna, MD;  Location: [facility];  Service: General;  Laterality: Right;   BREAST LUMPECTOMY WITH RADIOACTIVE SEED LOCALIZATION Left 11/03/2021   Procedure: LEFT BREAST BRACKETED LUMPECTOMY WITH RADIOACTIVE SEED LOCALIZATION;  Surgeon: Erroll Luna, MD;  Location: [facility];  Service: General;  Laterality: Left;   IR IMAGING GUIDED PORT INSERTION  06/17/2021   LAPAROSCOPIC ASSISTED VAGINAL HYSTERECTOMY N/A 02/27/2014   Procedure: LAPAROSCOPIC ASSISTED VAGINAL HYSTERECTOMY;  Surgeon: Maeola Sarah. Landry Mellow, MD;  Location: [facility];  Service: Gynecology;  Laterality: N/A;  abdomen to vagina to abdomen   TONSILLECTOMY     Patient Active Problem List   Diagnosis Date Noted   Vitamin D deficiency  07/09/2021   Iron deficiency anemia 07/09/2021   Hyperlipidemia 07/09/2021   Port-A-Cath in place 06/18/2021   Malignant neoplasm of upper-outer quadrant of right breast in female, estrogen receptor negative ([place]) 05/27/2021   Atypical ductal hyperplasia of right breast 07/18/2018   Genetic testing 03/31/2018   Family history of breast cancer in mother 04/03/2015   Family history of pancreatic cancer 04/03/2015   S/P hysterectomy 02/27/2014   ANEMIA 07/01/2009    PCP: Marilynne Drivers PA   REFERRING PROVIDER:  Dr. Lindi Adie   REFERRING DIAG: Rt breast cancer   THERAPY DIAG:  Malignant neoplasm of upper-outer quadrant of right breast in female, estrogen receptor negative ([place])  Acute pain of right shoulder  Stiffness of right shoulder, not elsewhere classified  Abnormal posture  Rationale for Evaluation and Treatment: Rehabilitation  ONSET DATE: 05/19/2021  SUBJECTIVE:  SUBJECTIVE STATEMENT: The radiation first day went well.    PERTINENT HISTORY:  Breast cancer recurrence noted 05/19/21. Hx of Rt lumpectomy x 2 in 2010 with Dr. Brantley Stage.  0/1 lymph node removed. Plan is for neoadjuvant chemotherapy TCHP followed by lumpectomy and SLNB and radiation.   PATIENT GOALS:  Reassess how my recovery is going related to arm function, pain, and swelling.  PAIN:  Are you having pain? Yes: NPRS scale: 2/10 Pain location: Right wear the sports bra sits in the armpit Pain description: dull, achy, sharp, tender to touch  Aggravating factors: moving the arm Relieving factors: Removing the compression bra   PRECAUTIONS: Recent Surgery, right UE Lymphedema risk, None  ACTIVITY LEVEL / LEISURE: ride bike, trying hand weights, have a total gym but it has been a few months    OBJECTIVE:   PATIENT  SURVEYS:  QUICK DASH: 45.45  OBSERVATIONS: Axillary cording in the right arm  Incision site healing as expected with the presence of glue   POSTURE:  Rounded shoulder posture   LYMPHEDEMA ASSESSMENT:  UPPER EXTREMITY AROM/PROM:   A/PROM RIGHT   eval   11/24/2021 12/02/21  Shoulder extension 60 55   Shoulder flexion 150 119 tight and pulling  145- upper arm pull  Shoulder abduction 165 - top of the shoulder and upper arm feels tight 121- tight and pulling  135 - upper arm pull  Shoulder internal rotation      Shoulder external rotation 91 80                           (Blank rows = not tested)   A/PROM LEFT   eval 11/24/2021  Shoulder extension 60 70  Shoulder flexion 150 151  Shoulder abduction 165 165  Shoulder internal rotation     Shoulder external rotation                             (Blank rows = not tested)   SHOULDER STRENGTH: Flexion: 5/5 bil  Abduction: 4+/5 bil with some pain on the top of the Rt shoulder ER: 5/5 IR:5/5   CERVICAL AROM: All within normal limits:    LYMPHEDEMA ASSESSMENTS:    LANDMARK RIGHT   eval 11/24/2021  10 cm proximal to olecranon process 33.5 31  Olecranon process 27 25.6  10 cm proximal to ulnar styloid process 22 21.5  Just proximal to ulnar styloid process 15.7 15.1  Across hand at thumb web space 18.5 17.6  At base of 2nd digit 5.9 5.5  (Blank rows = not tested)   LANDMARK LEFT   eval  10 cm proximal to olecranon process 33.5  Olecranon process 27  10 cm proximal to ulnar styloid process 22  Just proximal to ulnar styloid process 15.4  Across hand at thumb web space 18.7  At base of 2nd digit 6.0  (Blank rows = not tested)  Surgery type/Date: 11/03/2021 right and left  breast lumpectomy  Number of lymph nodes removed: 4 Current/past treatment (chemo, radiation, hormone therapy): chemotherapy, radiation, herceptin and perjeta   TODAY'S TREATMENT: 12/15/21: Therapeutic Exercises pulleys into flexion and abduction  x 2 min each returning therapist demo Roll ball up wall into flexion and abduction x10 each, returning therapist demo and Vcs to decrease Rt scapular compensation  On large mat table: Supine on half roll for bil horz abd and then abd with "snow angel" x10 each  Supine on mat table LTR arms out to the side at around 90deg  x5 each, 20" each   Manual Therapy MFR at Rt axilla and upper arm, cording very mildly palpable today PROM of Rt UE into flexion, abduction, and D2 STM: Gentle scar tissue mobs over center of lumpectomy incision where well healed and medial to that where scar tissue palpable where lump was.  Pt instructed in same, especially after shower and with cocoa butter with vitamin E that she already has at home. Was also instructed to not rush trying to get the glue off and pt verbalized understanding.   12/08/21:  pulleys into flexion x 68mn with initial instruction Supine dowel flexion x 10 Supine chest stretch 5" x 3 LTR arms out to the side at around 90deg 6" x5 each   MT: Cording release in the upper arm and axilla at an abduction angle that is tolerated.  PROM of the arm into flexion, abduction, and D2 MLD of the arm post release to decrease soreness.   Gave tg soft medium with large upper arm fold for comfort trial  12/02/21:  pulleys into flexion x 370m with initial instruction Dowel abduction x 5 in front of the mirror Supine dowel flexion x 5 Supine chest stretch x 10 with active in and out movement MT: Cording release in the upper arm and axilla at an abduction angle that is tolerated.  PROM of the arm into flexion, abduction, and D2 MLD of the arm post release to decrease soreness.    PATIENT EDUCATION:  Education details: ABCC, compression garments, scar massage, SOZO, posture, POC and HEP Person educated: Patient Education method: Explanation and Handouts Education comprehension: verbalized understanding  HOME EXERCISE PROGRAM: Reviewed previously given post op  HEP.   ASSESSMENT: CLINICAL IMPRESSION: Pts cording seemed improved today as she had no c/o pain with end ROMs stretches or with manual therapy. Cording was also only mildly palpable today as well. Progressed stretches to help further facilitate reduction of fascial tightness and cording.   Pt will benefit from skilled therapeutic intervention to improve on the following deficits: Decreased knowledge of precautions, impaired UE functional use, pain, decreased ROM, postural dysfunction.   PT treatment/interventions: ADL/Self care home management, Therapeutic exercises, Therapeutic activity, Neuromuscular re-education, Balance training, Gait training, Patient/Family education, Self Care, Joint mobilization, Dry Needling, Manual lymph drainage, Compression bandaging, and scar mobilization   GOALS: Goals reviewed with patient? Yes  LONG TERM GOALS:  (STG=LTG)  GOALS Name Target Date  Goal status  1 Pt will demonstrate she has regained full shoulder ROM and function post operatively compared to baselines.  Baseline: 01/12/2022 INITIAL  2 Patient will report 75 % less discomfort with ROM due to cording to demonstrated improvement in function  01/12/2022 INITIAL  3 Pt will report 90% reduction of pain due to improvements in posture, strength, and muscle length  01/12/2022 INITIAL  4 Pt will be independent with advanced HEP to maintain improvements made throughout therapy    01/12/2022 INITIAL     PLAN:  PT FREQUENCY/DURATION: 1x 6 weeks  PLAN FOR NEXT SESSION: Manual therapy to area of cording, end P/ROM and stretching    [facility]  [address]  [place] [phone] VaCollie SiadPTA 12/15/21 12:11 PM

## 2021-12-16 ENCOUNTER — Other Ambulatory Visit: Payer: Self-pay

## 2021-12-16 ENCOUNTER — Ambulatory Visit
Admission: RE | Admit: 2021-12-16 | Discharge: 2021-12-16 | Disposition: A | Discharge status: Home / Self Care | Payer: Federal, State, Local not specified - PPO | Source: Ambulatory Visit | Attending: Radiation Oncology | Admitting: Radiation Oncology

## 2021-12-16 DIAGNOSIS — Z171 Estrogen receptor negative status [ER-]: Secondary | ICD-10-CM | POA: Diagnosis not present

## 2021-12-16 DIAGNOSIS — C50411 Malignant neoplasm of upper-outer quadrant of right female breast: Secondary | ICD-10-CM | POA: Diagnosis not present

## 2021-12-16 DIAGNOSIS — Z510 Encounter for antineoplastic radiation therapy: Secondary | ICD-10-CM | POA: Diagnosis not present

## 2021-12-16 DIAGNOSIS — Z5112 Encounter for antineoplastic immunotherapy: Secondary | ICD-10-CM | POA: Diagnosis not present

## 2021-12-16 LAB — RAD ONC ARIA SESSION SUMMARY
Course Elapsed Days: 9
Plan Fractions Treated to Date: 8
Plan Prescribed Dose Per Fraction: 2.67 Gy
Plan Total Fractions Prescribed: 15
Plan Total Prescribed Dose: 40.05 Gy
Reference Point Dosage Given to Date: 21.36 Gy
Reference Point Session Dosage Given: 2.67 Gy
Session Number: 8

## 2021-12-17 ENCOUNTER — Other Ambulatory Visit: Payer: Self-pay

## 2021-12-17 ENCOUNTER — Ambulatory Visit
Admission: RE | Admit: 2021-12-17 | Discharge: 2021-12-17 | Disposition: A | Discharge status: Home / Self Care | Payer: Federal, State, Local not specified - PPO | Source: Ambulatory Visit | Attending: Radiation Oncology | Admitting: Radiation Oncology

## 2021-12-17 DIAGNOSIS — Z5112 Encounter for antineoplastic immunotherapy: Secondary | ICD-10-CM | POA: Diagnosis not present

## 2021-12-17 DIAGNOSIS — C50411 Malignant neoplasm of upper-outer quadrant of right female breast: Secondary | ICD-10-CM | POA: Diagnosis not present

## 2021-12-17 DIAGNOSIS — Z171 Estrogen receptor negative status [ER-]: Secondary | ICD-10-CM | POA: Diagnosis not present

## 2021-12-17 DIAGNOSIS — Z510 Encounter for antineoplastic radiation therapy: Secondary | ICD-10-CM | POA: Diagnosis not present

## 2021-12-17 LAB — RAD ONC ARIA SESSION SUMMARY
Course Elapsed Days: 10
Plan Fractions Treated to Date: 9
Plan Prescribed Dose Per Fraction: 2.67 Gy
Plan Total Fractions Prescribed: 15
Plan Total Prescribed Dose: 40.05 Gy
Reference Point Dosage Given to Date: 24.03 Gy
Reference Point Session Dosage Given: 2.67 Gy
Session Number: 9

## 2021-12-18 ENCOUNTER — Ambulatory Visit
Admission: RE | Admit: 2021-12-18 | Discharge: 2021-12-18 | Disposition: A | Discharge status: Home / Self Care | Payer: Federal, State, Local not specified - PPO | Source: Ambulatory Visit | Attending: Radiation Oncology | Admitting: Radiation Oncology

## 2021-12-18 ENCOUNTER — Other Ambulatory Visit: Payer: Self-pay

## 2021-12-18 DIAGNOSIS — Z510 Encounter for antineoplastic radiation therapy: Secondary | ICD-10-CM | POA: Diagnosis not present

## 2021-12-18 DIAGNOSIS — Z171 Estrogen receptor negative status [ER-]: Secondary | ICD-10-CM | POA: Diagnosis not present

## 2021-12-18 DIAGNOSIS — Z5112 Encounter for antineoplastic immunotherapy: Secondary | ICD-10-CM | POA: Diagnosis not present

## 2021-12-18 DIAGNOSIS — C50411 Malignant neoplasm of upper-outer quadrant of right female breast: Secondary | ICD-10-CM | POA: Diagnosis not present

## 2021-12-18 LAB — RAD ONC ARIA SESSION SUMMARY
Course Elapsed Days: 11
Plan Fractions Treated to Date: 10
Plan Prescribed Dose Per Fraction: 2.67 Gy
Plan Total Fractions Prescribed: 15
Plan Total Prescribed Dose: 40.05 Gy
Reference Point Dosage Given to Date: 26.7 Gy
Reference Point Session Dosage Given: 2.67 Gy
Session Number: 10

## 2021-12-20 ENCOUNTER — Other Ambulatory Visit: Payer: Self-pay

## 2021-12-21 ENCOUNTER — Ambulatory Visit
Admission: RE | Admit: 2021-12-21 | Discharge: 2021-12-21 | Disposition: A | Discharge status: Home / Self Care | Payer: Federal, State, Local not specified - PPO | Source: Ambulatory Visit | Attending: Radiation Oncology | Admitting: Radiation Oncology

## 2021-12-21 ENCOUNTER — Other Ambulatory Visit: Payer: Self-pay

## 2021-12-21 DIAGNOSIS — Z5112 Encounter for antineoplastic immunotherapy: Secondary | ICD-10-CM | POA: Diagnosis not present

## 2021-12-21 DIAGNOSIS — C50411 Malignant neoplasm of upper-outer quadrant of right female breast: Secondary | ICD-10-CM | POA: Diagnosis not present

## 2021-12-21 DIAGNOSIS — Z171 Estrogen receptor negative status [ER-]: Secondary | ICD-10-CM | POA: Diagnosis not present

## 2021-12-21 DIAGNOSIS — Z510 Encounter for antineoplastic radiation therapy: Secondary | ICD-10-CM | POA: Diagnosis not present

## 2021-12-21 LAB — RAD ONC ARIA SESSION SUMMARY
Course Elapsed Days: 14
Plan Fractions Treated to Date: 11
Plan Prescribed Dose Per Fraction: 2.67 Gy
Plan Total Fractions Prescribed: 15
Plan Total Prescribed Dose: 40.05 Gy
Reference Point Dosage Given to Date: 29.37 Gy
Reference Point Session Dosage Given: 2.67 Gy
Session Number: 11

## 2021-12-21 MED ORDER — RADIAPLEXRX EX GEL
Freq: Once | CUTANEOUS | Status: AC
  Administered 2021-12-21: 1 via TOPICAL

## 2021-12-22 ENCOUNTER — Ambulatory Visit
Admission: RE | Admit: 2021-12-22 | Discharge: 2021-12-22 | Disposition: A | Discharge status: Home / Self Care | Payer: Federal, State, Local not specified - PPO | Source: Ambulatory Visit | Attending: Radiation Oncology | Admitting: Radiation Oncology

## 2021-12-22 ENCOUNTER — Other Ambulatory Visit: Payer: Self-pay

## 2021-12-22 ENCOUNTER — Ambulatory Visit: Payer: Federal, State, Local not specified - PPO | Admitting: Rehabilitation

## 2021-12-22 ENCOUNTER — Encounter: Payer: Self-pay | Admitting: Rehabilitation

## 2021-12-22 DIAGNOSIS — Z510 Encounter for antineoplastic radiation therapy: Secondary | ICD-10-CM | POA: Diagnosis not present

## 2021-12-22 DIAGNOSIS — R293 Abnormal posture: Secondary | ICD-10-CM | POA: Diagnosis not present

## 2021-12-22 DIAGNOSIS — M25611 Stiffness of right shoulder, not elsewhere classified: Secondary | ICD-10-CM

## 2021-12-22 DIAGNOSIS — M25511 Pain in right shoulder: Secondary | ICD-10-CM | POA: Diagnosis not present

## 2021-12-22 DIAGNOSIS — Z5112 Encounter for antineoplastic immunotherapy: Secondary | ICD-10-CM | POA: Diagnosis not present

## 2021-12-22 DIAGNOSIS — Z171 Estrogen receptor negative status [ER-]: Secondary | ICD-10-CM

## 2021-12-22 DIAGNOSIS — C50411 Malignant neoplasm of upper-outer quadrant of right female breast: Secondary | ICD-10-CM | POA: Diagnosis not present

## 2021-12-22 LAB — RAD ONC ARIA SESSION SUMMARY
Course Elapsed Days: 15
Plan Fractions Treated to Date: 12
Plan Prescribed Dose Per Fraction: 2.67 Gy
Plan Total Fractions Prescribed: 15
Plan Total Prescribed Dose: 40.05 Gy
Reference Point Dosage Given to Date: 32.04 Gy
Reference Point Session Dosage Given: 2.67 Gy
Session Number: 12

## 2021-12-22 NOTE — Therapy (Signed)
OUTPATIENT PHYSICAL THERAPY BREAST CANCER TREATMENT   Patient Name: Patricia Houston MRN: [id number] DOB:February 23, 1965, 56 y.o., female Today's Date: 12/22/2021  END OF SESSION:  PT End of Session - 12/22/21 1102     Visit Number 6    Number of Visits 8    Date for PT Re-Evaluation 01/12/22    PT Start Time 1103    PT Stop Time 6384    PT Time Calculation (min) 53 min    Activity Tolerance Patient tolerated treatment well    Behavior During Therapy WFL for tasks assessed/performed              Past Medical History:  Diagnosis Date   Anemia    TAKES IRON PILLS   Anxiety    Cancer ([place])    right breast DCIS   Depression    Past Surgical History:  Procedure Laterality Date   ABDOMINAL HYSTERECTOMY     BREAST LUMPECTOMY WITH RADIOACTIVE SEED AND SENTINEL LYMPH NODE BIOPSY Right 11/03/2021   Procedure: RIGHT BREAST BRACKETED LUMPECTOMY WITH RADIOACTIVE SEED AND SENTINEL LYMPH NODE BIOPSY;  Surgeon: Erroll Luna, MD;  Location: [facility];  Service: General;  Laterality: Right;   BREAST LUMPECTOMY WITH RADIOACTIVE SEED LOCALIZATION Right 06/14/2018   Procedure: RIGHT BREAST RADIOACTIVE SEED LOCALIZATION LUMPECTOMY X2;  Surgeon: Erroll Luna, MD;  Location: [facility];  Service: General;  Laterality: Right;   BREAST LUMPECTOMY WITH RADIOACTIVE SEED LOCALIZATION Left 11/03/2021   Procedure: LEFT BREAST BRACKETED LUMPECTOMY WITH RADIOACTIVE SEED LOCALIZATION;  Surgeon: Erroll Luna, MD;  Location: [facility];  Service: General;  Laterality: Left;   IR IMAGING GUIDED PORT INSERTION  06/17/2021   LAPAROSCOPIC ASSISTED VAGINAL HYSTERECTOMY N/A 02/27/2014   Procedure: LAPAROSCOPIC ASSISTED VAGINAL HYSTERECTOMY;  Surgeon: Maeola Sarah. Landry Mellow, MD;  Location: [facility];  Service: Gynecology;  Laterality: N/A;  abdomen to vagina to abdomen   TONSILLECTOMY     Patient Active Problem List   Diagnosis Date Noted   Vitamin D deficiency  07/09/2021   Iron deficiency anemia 07/09/2021   Hyperlipidemia 07/09/2021   Port-A-Cath in place 06/18/2021   Malignant neoplasm of upper-outer quadrant of right breast in female, estrogen receptor negative ([place]) 05/27/2021   Atypical ductal hyperplasia of right breast 07/18/2018   Genetic testing 03/31/2018   Family history of breast cancer in mother 04/03/2015   Family history of pancreatic cancer 04/03/2015   S/P hysterectomy 02/27/2014   ANEMIA 07/01/2009    PCP: Marilynne Drivers PA   REFERRING PROVIDER:  Dr. Lindi Adie   REFERRING DIAG: Rt breast cancer   THERAPY DIAG:  Malignant neoplasm of upper-outer quadrant of right breast in female, estrogen receptor negative ([place])  Stiffness of right shoulder, not elsewhere classified  Abnormal posture  Acute pain of right shoulder  Rationale for Evaluation and Treatment: Rehabilitation  ONSET DATE: 05/19/2021  SUBJECTIVE:  SUBJECTIVE STATEMENT: I am done with radiation on Friday.  The skin is irritated and started to have some shooting pains.      PERTINENT HISTORY:  Breast cancer recurrence noted 05/19/21. Hx of Rt lumpectomy x 2 in 2010 with Dr. Brantley Stage.  0/1 lymph node removed. Plan is for neoadjuvant chemotherapy TCHP followed by lumpectomy and SLNB and radiation.   PATIENT GOALS:  Reassess how my recovery is going related to arm function, pain, and swelling.  PAIN:  Are you having pain? Yes 5/10 Rt breast   PRECAUTIONS: Recent Surgery, right UE Lymphedema risk, None  ACTIVITY LEVEL / LEISURE: ride bike, trying hand weights, have a total gym but it has been a few months    OBJECTIVE:   PATIENT SURVEYS:  QUICK DASH: 45.45  OBSERVATIONS: Axillary cording in the right arm  Incision site healing as expected with the presence of glue    POSTURE:  Rounded shoulder posture   LYMPHEDEMA ASSESSMENT:  UPPER EXTREMITY AROM/PROM:   A/PROM RIGHT   eval   11/24/2021 12/02/21  Shoulder extension 60 55   Shoulder flexion 150 119 tight and pulling  145- upper arm pull  Shoulder abduction 165 - top of the shoulder and upper arm feels tight 121- tight and pulling  135 - upper arm pull  Shoulder internal rotation      Shoulder external rotation 91 80                           (Blank rows = not tested)   A/PROM LEFT   eval 11/24/2021  Shoulder extension 60 70  Shoulder flexion 150 151  Shoulder abduction 165 165  Shoulder internal rotation     Shoulder external rotation                             (Blank rows = not tested)   SHOULDER STRENGTH: Flexion: 5/5 bil  Abduction: 4+/5 bil with some pain on the top of the Rt shoulder ER: 5/5 IR:5/5   CERVICAL AROM: All within normal limits:    LYMPHEDEMA ASSESSMENTS:    LANDMARK RIGHT   eval 11/24/2021  10 cm proximal to olecranon process 33.5 31  Olecranon process 27 25.6  10 cm proximal to ulnar styloid process 22 21.5  Just proximal to ulnar styloid process 15.7 15.1  Across hand at thumb web space 18.5 17.6  At base of 2nd digit 5.9 5.5  (Blank rows = not tested)   LANDMARK LEFT   eval  10 cm proximal to olecranon process 33.5  Olecranon process 27  10 cm proximal to ulnar styloid process 22  Just proximal to ulnar styloid process 15.4  Across hand at thumb web space 18.7  At base of 2nd digit 6.0  (Blank rows = not tested)  Surgery type/Date: 11/03/2021 right and left  breast lumpectomy  Number of lymph nodes removed: 4 Current/past treatment (chemo, radiation, hormone therapy): chemotherapy, radiation, herceptin and perjeta   TODAY'S TREATMENT: 12/22/21: Therapeutic Exercises pulleys into flexion and abduction x 2 min each returning therapist demo On large mat table: bil horz abd and then abd with "snow angel" x10 each Supine on mat table LTR arms  out to the side at around 90deg  x5 each, 20" each   Alternating flexion x 10 bil Yellow band bil ER x 5, horizontal abduction x 5 Manual Therapy Measured SOZO due to subjective  feelings of edema which is slightly elevated but still in the green.  Measured pt for size 2 max juzo soft to order if she would like - will consider.   MFR at Rt axilla and upper arm, cording very mildly palpable today PROM of Rt UE into flexion, abduction, and D2  Held STM due to radiation redness   Pt noted small painful bump on her back and this was noted to be under an old cyst removal scar - most likely returning cyst.  PT also noted one in the axilla that felt the same but not painful.  Pt will led oncologist know about this.   12/08/21:  pulleys into flexion x 45mn with initial instruction Supine dowel flexion x 10 Supine chest stretch 5" x 3 LTR arms out to the side at around 90deg 6" x5 each   MT: Cording release in the upper arm and axilla at an abduction angle that is tolerated.  PROM of the arm into flexion, abduction, and D2 MLD of the arm post release to decrease soreness.   Gave tg soft medium with large upper arm fold for comfort trial  12/02/21:  pulleys into flexion x 3566m with initial instruction Dowel abduction x 5 in front of the mirror Supine dowel flexion x 5 Supine chest stretch x 10 with active in and out movement MT: Cording release in the upper arm and axilla at an abduction angle that is tolerated.  PROM of the arm into flexion, abduction, and D2 MLD of the arm post release to decrease soreness.    PATIENT EDUCATION:  Education details: ABCC, compression garments, scar massage, SOZO, posture, POC and HEP Person educated: Patient Education method: Explanation and Handouts Education comprehension: verbalized understanding  HOME EXERCISE PROGRAM: Reviewed previously given post op HEP.   ASSESSMENT: CLINICAL IMPRESSION: Cording was also only mildly palpable today and pt seems to  feel the pain in the arm only intermittently for no reason.  We did run SOZO today despite being <66m31monthost-op due to reports of feeling much better with Tg soft.  This number has increased but is still in the green region.  Did give pt ordering information for a real sleeve in case she would like to get one. Progressed stretches to help further facilitate reduction of fascial tightness and cording.   Pt will benefit from skilled therapeutic intervention to improve on the following deficits: Decreased knowledge of precautions, impaired UE functional use, pain, decreased ROM, postural dysfunction.   PT treatment/interventions: ADL/Self care home management, Therapeutic exercises, Therapeutic activity, Neuromuscular re-education, Balance training, Gait training, Patient/Family education, Self Care, Joint mobilization, Dry Needling, Manual lymph drainage, Compression bandaging, and scar mobilization   GOALS: Goals reviewed with patient? Yes  LONG TERM GOALS:  (STG=LTG)  GOALS Name Target Date  Goal status  1 Pt will demonstrate she has regained full shoulder ROM and function post operatively compared to baselines.  Baseline: 01/12/2022 INITIAL  2 Patient will report 75 % less discomfort with ROM due to cording to demonstrated improvement in function  01/12/2022 INITIAL  3 Pt will report 90% reduction of pain due to improvements in posture, strength, and muscle length  01/12/2022 INITIAL  4 Pt will be independent with advanced HEP to maintain improvements made throughout therapy    01/12/2022 INITIAL     PLAN:  PT FREQUENCY/DURATION: 1x 6 weeks  PLAN FOR NEXT SESSION: Manual therapy to area of cording, end P/ROM and stretching, supine scap   [facility]  [address], [address]   [place] [address]  [phone]   Shan Levans, PT  12/22/21 11:57 AM

## 2021-12-23 ENCOUNTER — Other Ambulatory Visit: Payer: Self-pay

## 2021-12-23 ENCOUNTER — Ambulatory Visit: Payer: Federal, State, Local not specified - PPO

## 2021-12-23 ENCOUNTER — Ambulatory Visit
Admission: RE | Admit: 2021-12-23 | Discharge: 2021-12-23 | Disposition: A | Discharge status: Home / Self Care | Payer: Federal, State, Local not specified - PPO | Source: Ambulatory Visit | Attending: Radiation Oncology | Admitting: Radiation Oncology

## 2021-12-23 DIAGNOSIS — C50411 Malignant neoplasm of upper-outer quadrant of right female breast: Secondary | ICD-10-CM | POA: Diagnosis not present

## 2021-12-23 DIAGNOSIS — Z510 Encounter for antineoplastic radiation therapy: Secondary | ICD-10-CM | POA: Diagnosis not present

## 2021-12-23 DIAGNOSIS — Z171 Estrogen receptor negative status [ER-]: Secondary | ICD-10-CM | POA: Diagnosis not present

## 2021-12-23 DIAGNOSIS — Z5112 Encounter for antineoplastic immunotherapy: Secondary | ICD-10-CM | POA: Diagnosis not present

## 2021-12-23 LAB — RAD ONC ARIA SESSION SUMMARY
Course Elapsed Days: 16
Plan Fractions Treated to Date: 13
Plan Prescribed Dose Per Fraction: 2.67 Gy
Plan Total Fractions Prescribed: 15
Plan Total Prescribed Dose: 40.05 Gy
Reference Point Dosage Given to Date: 34.71 Gy
Reference Point Session Dosage Given: 2.67 Gy
Session Number: 13

## 2021-12-23 NOTE — Progress Notes (Signed)
Patient Care Team: Lois Huxley, PA as PCP - General (Family Medicine) Mauro Kaufmann, RN as Oncology Nurse Navigator Rockwell Germany, RN as Oncology Nurse Navigator Erroll Luna, MD as Consulting Physician (General Surgery) Nicholas Lose, MD as Consulting Physician (Hematology and Oncology) Eppie Gibson, MD as Attending Physician (Radiation Oncology)  DIAGNOSIS: No diagnosis found.  SUMMARY OF ONCOLOGIC HISTORY: Oncology History  Malignant neoplasm of upper-outer quadrant of right breast in female, estrogen receptor negative ([place])  06/14/2018 Surgery   06/14/2018: Right lumpectomy: Intraductal papilloma with usual ductal hyperplasia, 1 benign lymph node (decided against tamoxifen)   05/19/2021 Relapse/Recurrence   Screening mammogram detected right breast density.  2 lymph nodes were noted biopsy appointment was benign.  Ultrasound revealed 2.2 cm mass at 11 o'clock position right breast biopsy: Grade 3 IDC with necrosis, ER 0%, PR 0%, Ki-67 60%, HER2 3+ positive by [facility]   05/27/2021 Cancer Staging   Staging form: Breast, AJCC 8th Edition - Clinical: Stage IIA (cT2, cN0, cM0, G3, ER-, PR-, HER2+) - Signed by Nicholas Lose, MD on 05/27/2021 Stage prefix: Initial diagnosis Histologic grading system: 3 grade system   06/18/2021 - 08/22/2021 Chemotherapy   Patient is on Treatment Plan : BREAST  Docetaxel + Carboplatin + Trastuzumab + Pertuzumab  (TCHP) q21d      06/18/2021 -  Chemotherapy   Patient is on Treatment Plan : BREAST  Docetaxel + Carboplatin + Trastuzumab + Pertuzumab  (TCHP) q21d / Trastuzumab + Pertuzumab q21d       CHIEF COMPLIANT:   INTERVAL HISTORY: Patricia Houston is a   ALLERGIES:  has No Known Allergies.  MEDICATIONS:  Current Outpatient Medications  Medication Sig Dispense Refill   Cetirizine HCl (ZYRTEC PO) Take by mouth.     cholecalciferol (VITAMIN D3) 25 MCG (1000 UNIT) tablet Take 1 tablet (1,000 Units total) by mouth daily.     Cyanocobalamin  (VITAMIN B-12) 3000 MCG SUBL Place 1 tablet under the tongue daily.     ferrous sulfate 325 (65 FE) MG tablet      ibuprofen (ADVIL) 200 MG tablet Take 200 mg by mouth as needed for moderate pain.     No current facility-administered medications for this visit.    PHYSICAL EXAMINATION: ECOG PERFORMANCE STATUS: {CHL ONC ECOG PS:[phone]}  There were no vitals filed for this visit. There were no vitals filed for this visit.  BREAST:*** No palpable masses or nodules in either right or left breasts. No palpable axillary supraclavicular or infraclavicular adenopathy no breast tenderness or nipple discharge. (exam performed in the presence of a chaperone)  LABORATORY DATA:  I have reviewed the data as listed    Latest Ref Rng & Units 11/19/2021   12:30 PM 10/23/2021    8:26 AM 10/01/2021    9:02 AM  CMP  Glucose 70 - 99 mg/dL 83  105  143   BUN 6 - 20 mg/dL _0 Creatinine 0.44 - 1.00 mg/dL 1.13  1.00  1.01   Sodium 135 - 145 mmol/L 139  139  137   Potassium 3.5 - 5.1 mmol/L 4.0  4.0  4.0   Chloride 98 - 111 mmol/L 104  106  102   CO2 22 - 32 mmol/L _1 Calcium 8.9 - 10.3 mg/dL 10.5  9.0  9.4   Total Protein 6.5 - 8.1 g/dL 7.5  6.2  7.0   Total Bilirubin 0.3 - 1.2 mg/dL 0.3  0.3  0.3   Alkaline Phos 38 - 126 U/L 93  85  95   AST 15 - 41 U/L _0 ALT 0 - 44 U/L 29  30  34     Lab Results  Component Value Date   WBC 4.6 11/19/2021   HGB 9.7 (L) 11/19/2021   HCT 29.4 (L) 11/19/2021   MCV 97.0 11/19/2021   PLT 189 11/19/2021   NEUTROABS 2.3 11/19/2021    ASSESSMENT & PLAN:  No problem-specific Assessment & Plan notes found for this encounter.    No orders of the defined types were placed in this encounter.  The patient has a good understanding of the overall plan. she agrees with it. she will call with any problems that may develop before the next visit here. Total time spent: 30 mins including face to face time and time spent for planning,  charting and co-ordination of care   Suzzette Righter, [place] 12/23/21    I Gardiner Coins am acting as a Education administrator for [employer]  ***

## 2021-12-24 ENCOUNTER — Inpatient Hospital Stay: Payer: Federal, State, Local not specified - PPO | Attending: Hematology and Oncology

## 2021-12-24 ENCOUNTER — Other Ambulatory Visit: Payer: Self-pay

## 2021-12-24 ENCOUNTER — Ambulatory Visit
Admission: RE | Admit: 2021-12-24 | Discharge: 2021-12-24 | Disposition: A | Discharge status: Home / Self Care | Payer: Federal, State, Local not specified - PPO | Source: Ambulatory Visit | Attending: Radiation Oncology | Admitting: Radiation Oncology

## 2021-12-24 ENCOUNTER — Inpatient Hospital Stay (HOSPITAL_BASED_OUTPATIENT_CLINIC_OR_DEPARTMENT_OTHER): Payer: Federal, State, Local not specified - PPO | Admitting: Hematology and Oncology

## 2021-12-24 VITALS — BP 116/89 | HR 86 | Resp 16

## 2021-12-24 VITALS — BP 118/87 | HR 94 | Temp 97.9°F | Resp 18 | Ht 66.0 in | Wt 177.5 lb

## 2021-12-24 DIAGNOSIS — I427 Cardiomyopathy due to drug and external agent: Secondary | ICD-10-CM | POA: Diagnosis not present

## 2021-12-24 DIAGNOSIS — C50411 Malignant neoplasm of upper-outer quadrant of right female breast: Secondary | ICD-10-CM | POA: Diagnosis not present

## 2021-12-24 DIAGNOSIS — Z171 Estrogen receptor negative status [ER-]: Secondary | ICD-10-CM | POA: Diagnosis not present

## 2021-12-24 DIAGNOSIS — Z5112 Encounter for antineoplastic immunotherapy: Secondary | ICD-10-CM | POA: Diagnosis not present

## 2021-12-24 DIAGNOSIS — Z510 Encounter for antineoplastic radiation therapy: Secondary | ICD-10-CM | POA: Diagnosis not present

## 2021-12-24 LAB — RAD ONC ARIA SESSION SUMMARY
Course Elapsed Days: 17
Plan Fractions Treated to Date: 14
Plan Prescribed Dose Per Fraction: 2.67 Gy
Plan Total Fractions Prescribed: 15
Plan Total Prescribed Dose: 40.05 Gy
Reference Point Dosage Given to Date: 37.38 Gy
Reference Point Session Dosage Given: 2.67 Gy
Session Number: 14

## 2021-12-24 MED ORDER — DIPHENHYDRAMINE HCL 25 MG PO CAPS
25.0000 mg | ORAL_CAPSULE | Freq: Once | ORAL | Status: AC
  Administered 2021-12-24: 25 mg via ORAL
  Filled 2021-12-24: qty 1

## 2021-12-24 MED ORDER — HEPARIN SOD (PORK) LOCK FLUSH 100 UNIT/ML IV SOLN
500.0000 [IU] | Freq: Once | INTRAVENOUS | Status: AC | PRN
  Administered 2021-12-24: 500 [IU]

## 2021-12-24 MED ORDER — ACETAMINOPHEN 325 MG PO TABS
650.0000 mg | ORAL_TABLET | Freq: Once | ORAL | Status: AC
  Administered 2021-12-24: 650 mg via ORAL
  Filled 2021-12-24: qty 2

## 2021-12-24 MED ORDER — TRASTUZUMAB-DKST CHEMO 150 MG IV SOLR
6.0000 mg/kg | Freq: Once | INTRAVENOUS | Status: AC
  Administered 2021-12-24: 483 mg via INTRAVENOUS
  Filled 2021-12-24: qty 23

## 2021-12-24 MED ORDER — SODIUM CHLORIDE 0.9 % IV SOLN: Freq: Once | INTRAVENOUS | Status: AC

## 2021-12-24 MED ORDER — SODIUM CHLORIDE 0.9% FLUSH
10.0000 mL | INTRAVENOUS | Status: DC | PRN
  Administered 2021-12-24: 10 mL

## 2021-12-24 MED ORDER — SODIUM CHLORIDE 0.9 % IV SOLN
420.0000 mg | Freq: Once | INTRAVENOUS | Status: AC
  Administered 2021-12-24: 420 mg via INTRAVENOUS
  Filled 2021-12-24: qty 14

## 2021-12-24 NOTE — Patient Instructions (Signed)
[facility] ONCOLOGY  Discharge Instructions: Thank you for choosing [facility] to provide your oncology and hematology care.   If you have a lab appointment with the [facility], please go directly to the [facility] and check in at the registration area.   Wear comfortable clothing and clothing appropriate for easy access to any Portacath or PICC line.   We strive to give you quality time with your provider. You may need to reschedule your appointment if you arrive late (15 or more minutes).  Arriving late affects you and other patients whose appointments are after yours.  Also, if you miss three or more appointments without notifying the office, you may be dismissed from the clinic at the provider's discretion.      For prescription refill requests, have your pharmacy contact our office and allow 72 hours for refills to be completed.    Today you received the following chemotherapy and/or immunotherapy agents: Ogivri/Perjeta     To help prevent nausea and vomiting after your treatment, we encourage you to take your nausea medication as directed.  BELOW ARE SYMPTOMS THAT SHOULD BE REPORTED IMMEDIATELY: *FEVER GREATER THAN 100.4 F (38 C) OR HIGHER *CHILLS OR SWEATING *NAUSEA AND VOMITING THAT IS NOT CONTROLLED WITH YOUR NAUSEA MEDICATION *UNUSUAL SHORTNESS OF BREATH *UNUSUAL BRUISING OR BLEEDING *URINARY PROBLEMS (pain or burning when urinating, or frequent urination) *BOWEL PROBLEMS (unusual diarrhea, constipation, pain near the anus) TENDERNESS IN MOUTH AND THROAT WITH OR WITHOUT PRESENCE OF ULCERS (sore throat, sores in mouth, or a toothache) UNUSUAL RASH, SWELLING OR PAIN  UNUSUAL VAGINAL DISCHARGE OR ITCHING   Items with * indicate a potential emergency and should be followed up as soon as possible or go to the Emergency Department if any problems should occur.  Please show the CHEMOTHERAPY ALERT CARD or IMMUNOTHERAPY ALERT CARD at check-in  to the Emergency Department and triage nurse.  Should you have questions after your visit or need to cancel or reschedule your appointment, please contact [facility]  Dept: [phone]  and follow the prompts.  Office hours are 8:00 a.m. to 4:30 p.m. Monday - Friday. Please note that voicemails left after 4:00 p.m. may not be returned until the following business day.  We are closed weekends and major holidays. You have access to a nurse at all times for urgent questions. Please call the main number to the clinic Dept: [phone] and follow the prompts.   For any non-urgent questions, you may also contact your provider using MyChart. We now offer e-Visits for anyone 42 and older to request care online for non-urgent symptoms. For details visit [email or web address].   Also download the MyChart app! Go to the app store, search "MyChart", open the app, select , and log in with your MyChart username and password.  Masks are optional in the cancer centers. If you would like for your care team to wear a mask while they are taking care of you, please let them know. You may have one support person who is at least 54 years old accompany you for your appointments.

## 2021-12-24 NOTE — Assessment & Plan Note (Signed)
06/14/2018: Right lumpectomy: Intraductal papilloma with usual ductal hyperplasia, 1 benign lymph node (decided against tamoxifen) 05/19/2021: Screening mammogram detected right breast density.  2 lymph nodes were noted biopsy appointment was benign.  Ultrasound revealed 2.2 cm mass at 11 o'clock position right breast biopsy: Grade 3 IDC with necrosis, ER 0%, PR 0%, Ki-67 60%, HER2 3+ positive by IHC   Treatment Plan: 1. Neoadjuvant chemotherapy with TCH Perjeta 6 cycles followed by Herceptin Perjeta maintenance versus Kadcyla maintenance (based on response to neoadjuvant chemo) for 1 year 2. 11/03/2021: Right lumpectomy: Pathologic complete response, 0/4 lymph nodes, margins negative 3. Followed by adjuvant radiation therapy 12/08/2021-12/25/2021 ------------------------------------------------------------------------------------------------------------------------------- Treatment plan: Continue with Herceptin Perjeta maintenance Herceptin Perjeta toxicities: None Return to clinic every 3 weeks for Herceptin and Perjeta and every 6 weeks for follow-up with me.

## 2021-12-24 NOTE — Progress Notes (Signed)
Per Dr. Lindi Adie, Gould to proceed with tx today with ECHO from 09/16/21 left EF 60-65%, new ECHO has been ordered.

## 2021-12-25 ENCOUNTER — Other Ambulatory Visit: Payer: Self-pay

## 2021-12-25 ENCOUNTER — Ambulatory Visit
Admission: RE | Admit: 2021-12-25 | Discharge: 2021-12-25 | Disposition: A | Discharge status: Home / Self Care | Payer: Federal, State, Local not specified - PPO | Source: Ambulatory Visit | Attending: Radiation Oncology | Admitting: Radiation Oncology

## 2021-12-25 DIAGNOSIS — Z171 Estrogen receptor negative status [ER-]: Secondary | ICD-10-CM | POA: Diagnosis not present

## 2021-12-25 DIAGNOSIS — Z5112 Encounter for antineoplastic immunotherapy: Secondary | ICD-10-CM | POA: Diagnosis not present

## 2021-12-25 DIAGNOSIS — C50411 Malignant neoplasm of upper-outer quadrant of right female breast: Secondary | ICD-10-CM | POA: Diagnosis not present

## 2021-12-25 DIAGNOSIS — Z510 Encounter for antineoplastic radiation therapy: Secondary | ICD-10-CM | POA: Diagnosis not present

## 2021-12-25 LAB — RAD ONC ARIA SESSION SUMMARY
Course Elapsed Days: 18
Plan Fractions Treated to Date: 15
Plan Prescribed Dose Per Fraction: 2.67 Gy
Plan Total Fractions Prescribed: 15
Plan Total Prescribed Dose: 40.05 Gy
Reference Point Dosage Given to Date: 40.05 Gy
Reference Point Session Dosage Given: 2.67 Gy
Session Number: 15

## 2021-12-26 ENCOUNTER — Telehealth: Payer: Self-pay | Admitting: Hematology and Oncology

## 2021-12-26 ENCOUNTER — Telehealth: Payer: Self-pay

## 2021-12-26 NOTE — Telephone Encounter (Signed)
This nurse spoke with patient who states that she had infusion on 12/24/21 and Radiation on 12/25/21 and she had no symptoms until this morning and she feels lightheaded and intermittent nausea.  Patient did acknowledged that she may not be drinking enough fluids.  This nurse encouraged patient to drink more fluids.  Advised that the symptom management team is not here over the weekend.  However, this nurse will attempt to get her in on Tuesday if there is availability. Patient does have nausea medication on hand and states will take some if her nausea increases.  Also advised if her symptoms get worse to please have herself evaluated at the ED.  Patient acknowledged understanding and agreed to increase fluid intake.  No further questions or concerns noted.

## 2021-12-26 NOTE — Telephone Encounter (Signed)
Scheduled appointment per WQ. Patient is aware. 

## 2021-12-28 ENCOUNTER — Other Ambulatory Visit: Payer: Self-pay

## 2021-12-29 ENCOUNTER — Ambulatory Visit: Payer: Federal, State, Local not specified - PPO | Admitting: Radiation Oncology

## 2021-12-29 ENCOUNTER — Encounter: Payer: Self-pay | Admitting: Rehabilitation

## 2021-12-29 ENCOUNTER — Telehealth: Payer: Self-pay

## 2021-12-29 ENCOUNTER — Ambulatory Visit: Payer: Federal, State, Local not specified - PPO | Admitting: Rehabilitation

## 2021-12-29 DIAGNOSIS — M25611 Stiffness of right shoulder, not elsewhere classified: Secondary | ICD-10-CM

## 2021-12-29 DIAGNOSIS — Z171 Estrogen receptor negative status [ER-]: Secondary | ICD-10-CM | POA: Diagnosis not present

## 2021-12-29 DIAGNOSIS — C50411 Malignant neoplasm of upper-outer quadrant of right female breast: Secondary | ICD-10-CM | POA: Diagnosis not present

## 2021-12-29 DIAGNOSIS — M25511 Pain in right shoulder: Secondary | ICD-10-CM | POA: Diagnosis not present

## 2021-12-29 DIAGNOSIS — R293 Abnormal posture: Secondary | ICD-10-CM

## 2021-12-29 NOTE — Telephone Encounter (Signed)
Called pt to check on her. She states she is feeling OK and that she has increased fluid intake; is compliant with antiemetic use now. States she is lightheaded when she first wakes up. Advised pt if this continues to call us back for visit with [facility]. She verbalized understanding.

## 2021-12-29 NOTE — Telephone Encounter (Signed)
-----   Message from Estella Husk, LPN sent at 10/62/6948  3:27 PM EST ----- Good afternoon Ms. Patricia Houston!!  Do you mind following up with this patient today.  I just saw this message from [facility] and Dr. Lindi Adie had me to call this patient on Saturday when I covered him.   Joy   ----- Message ----- From: Derrick Ravel, RN Sent: 12/29/2021  10:02 AM EST To: Estella Husk, LPN  Hi Joy! [facility] is closed today, but we are open tomorrow if she needs IV fluids. ----- Message ----- From: Estella Husk, LPN Sent: [id number]/62/7035  11:39 AM EST To: Myrtie Hawk, RN; Derrick Ravel, RN  Good morning ladies,  I spoke with this patient today who states that she had infusion on 12/24/21 and radiation on 12/25/21.  She woke up this morning feeling lightheaded and intermittent nausea.  She admitted to not drinking enough fluid.  I encouraged her to increase her fluid intake and she states that she has nausea medication on hand and will take some if her nausea increases but if her symptoms get worse over the weekend she agreed to go to the ED. However, Dr. Lindi Adie wants to get her in for symptom management on Tuesday if her symptoms do not subside.    I am sending this message so that a follow up with her can be completed.    Thank You so much   Joy

## 2021-12-29 NOTE — Therapy (Signed)
OUTPATIENT PHYSICAL THERAPY BREAST CANCER TREATMENT   Patient Name: Patricia Houston MRN: [id number] DOB:11/04/1965, 56 y.o., female Today's Date: 12/29/2021  END OF SESSION:  PT End of Session - 12/29/21 0905     Visit Number 7    Number of Visits 8    Date for PT Re-Evaluation 01/12/22    PT Start Time 0906    PT Stop Time 0956    PT Time Calculation (min) 50 min    Activity Tolerance Patient tolerated treatment well    Behavior During Therapy WFL for tasks assessed/performed               Past Medical History:  Diagnosis Date   Anemia    TAKES IRON PILLS   Anxiety    Cancer ([place])    right breast DCIS   Depression    Past Surgical History:  Procedure Laterality Date   ABDOMINAL HYSTERECTOMY     BREAST LUMPECTOMY WITH RADIOACTIVE SEED AND SENTINEL LYMPH NODE BIOPSY Right 11/03/2021   Procedure: RIGHT BREAST BRACKETED LUMPECTOMY WITH RADIOACTIVE SEED AND SENTINEL LYMPH NODE BIOPSY;  Surgeon: Erroll Luna, MD;  Location: [place];  Service: General;  Laterality: Right;   BREAST LUMPECTOMY WITH RADIOACTIVE SEED LOCALIZATION Right 06/14/2018   Procedure: RIGHT BREAST RADIOACTIVE SEED LOCALIZATION LUMPECTOMY X2;  Surgeon: Erroll Luna, MD;  Location: [place];  Service: General;  Laterality: Right;   BREAST LUMPECTOMY WITH RADIOACTIVE SEED LOCALIZATION Left 11/03/2021   Procedure: LEFT BREAST BRACKETED LUMPECTOMY WITH RADIOACTIVE SEED LOCALIZATION;  Surgeon: Erroll Luna, MD;  Location: [place];  Service: General;  Laterality: Left;   IR IMAGING GUIDED PORT INSERTION  06/17/2021   LAPAROSCOPIC ASSISTED VAGINAL HYSTERECTOMY N/A 02/27/2014   Procedure: LAPAROSCOPIC ASSISTED VAGINAL HYSTERECTOMY;  Surgeon: Maeola Sarah. Landry Mellow, MD;  Location: [facility];  Service: Gynecology;  Laterality: N/A;  abdomen to vagina to abdomen   TONSILLECTOMY     Patient Active Problem List   Diagnosis Date Noted   Vitamin D deficiency  07/09/2021   Iron deficiency anemia 07/09/2021   Hyperlipidemia 07/09/2021   Port-A-Cath in place 06/18/2021   Malignant neoplasm of upper-outer quadrant of right breast in female, estrogen receptor negative ([place]) 05/27/2021   Atypical ductal hyperplasia of right breast 07/18/2018   Genetic testing 03/31/2018   Family history of breast cancer in mother 04/03/2015   Family history of pancreatic cancer 04/03/2015   S/P hysterectomy 02/27/2014   ANEMIA 07/01/2009    PCP: Marilynne Drivers PA   REFERRING PROVIDER:  Dr. Lindi Adie   REFERRING DIAG: Rt breast cancer   THERAPY DIAG:  Malignant neoplasm of upper-outer quadrant of right breast in female, estrogen receptor negative ([place])  Acute pain of right shoulder  Abnormal posture  Stiffness of right shoulder, not elsewhere classified  Rationale for Evaluation and Treatment: Rehabilitation  ONSET DATE: 05/19/2021  SUBJECTIVE:  SUBJECTIVE STATEMENT:     I am done with radiation!   PERTINENT HISTORY:  Breast cancer recurrence noted 05/19/21. Hx of Rt lumpectomy x 2 in 2010 with Dr. Brantley Stage.  0/1 lymph node removed. Plan is for neoadjuvant chemotherapy TCHP followed by lumpectomy and SLNB and radiation.   PATIENT GOALS:  Reassess how my recovery is going related to arm function, pain, and swelling.  PAIN:  Are you having pain? Yes 3/10 Rt breast   PRECAUTIONS: Recent Surgery, right UE Lymphedema risk, None  ACTIVITY LEVEL / LEISURE: ride bike, trying hand weights, have a total gym but it has been a few months    OBJECTIVE:   PATIENT SURVEYS:  QUICK DASH: 45.45  OBSERVATIONS: Axillary cording in the right arm  Incision site healing as expected with the presence of glue   POSTURE:  Rounded shoulder posture   LYMPHEDEMA ASSESSMENT:  UPPER  EXTREMITY AROM/PROM:   A/PROM RIGHT   eval   11/24/2021 12/02/21 12/29/21  Shoulder extension 60 55    Shoulder flexion 150 119 tight and pulling  145- upper arm pull 160  Shoulder abduction 165 - top of the shoulder and upper arm feels tight 121- tight and pulling  135 - upper arm pull 155 - some cording pain still   Shoulder internal rotation       Shoulder external rotation 91 80                            (Blank rows = not tested)   A/PROM LEFT   eval 11/24/2021  Shoulder extension 60 70  Shoulder flexion 150 151  Shoulder abduction 165 165  Shoulder internal rotation     Shoulder external rotation                             (Blank rows = not tested)   SHOULDER STRENGTH: Flexion: 5/5 bil  Abduction: 4+/5 bil with some pain on the top of the Rt shoulder ER: 5/5 IR:5/5   CERVICAL AROM: All within normal limits:    LYMPHEDEMA ASSESSMENTS:    LANDMARK RIGHT   eval 11/24/2021  10 cm proximal to olecranon process 33.5 31  Olecranon process 27 25.6  10 cm proximal to ulnar styloid process 22 21.5  Just proximal to ulnar styloid process 15.7 15.1  Across hand at thumb web space 18.5 17.6  At base of 2nd digit 5.9 5.5  (Blank rows = not tested)   LANDMARK LEFT   eval  10 cm proximal to olecranon process 33.5  Olecranon process 27  10 cm proximal to ulnar styloid process 22  Just proximal to ulnar styloid process 15.4  Across hand at thumb web space 18.7  At base of 2nd digit 6.0  (Blank rows = not tested)  Surgery type/Date: 11/03/2021 right and left  breast lumpectomy  Number of lymph nodes removed: 4 Current/past treatment (chemo, radiation, hormone therapy): chemotherapy, radiation, herceptin and perjeta   TODAY'S TREATMENT: 12/29/21: Therapeutic Exercises pulleys into flexion and abduction x 2 min each returning therapist demo On large mat table: Pectoralis stretch x 60" palms up Pectoralis minor stretch x 60" goal post arms bil horz abd and then abd  with "snow angel" x5 each Alternating flexion x 10 bil Yellow band bil ER x 10, horizontal abduction x 10 Manual Therapy MFR at Rt axilla and upper arm, cording very mildly palpable today  PROM of Rt UE into flexion, abduction, and D2  Held STM due to radiation redness    12/22/21: Therapeutic Exercises pulleys into flexion and abduction x 2 min each returning therapist demo On large mat table: bil horz abd and then abd with "snow angel" x10 each Supine on mat table LTR arms out to the side at around 90deg  x5 each, 20" each   Alternating flexion x 10 bil Yellow band bil ER x 5, horizontal abduction x 5 Manual Therapy Measured SOZO due to subjective feelings of edema which is slightly elevated but still in the green.  Measured pt for size 2 max juzo soft to order if she would like - will consider.   MFR at Rt axilla and upper arm, cording very mildly palpable today PROM of Rt UE into flexion, abduction, and D2  Held STM due to radiation redness   Pt noted small painful bump on her back and this was noted to be under an old cyst removal scar - most likely returning cyst.  PT also noted one in the axilla that felt the same but not painful.  Pt will led oncologist know about this.   12/08/21:  pulleys into flexion x 36mn with initial instruction Supine dowel flexion x 10 Supine chest stretch 5" x 3 LTR arms out to the side at around 90deg 6" x5 each   MT: Cording release in the upper arm and axilla at an abduction angle that is tolerated.  PROM of the arm into flexion, abduction, and D2 MLD of the arm post release to decrease soreness.   Gave tg soft medium with large upper arm fold for comfort trial  PATIENT EDUCATION:  Education details: ABCC, compression garments, scar massage, SOZO, posture, POC and HEP Person educated: Patient Education method: Explanation and Handouts Education comprehension: verbalized understanding  HOME EXERCISE PROGRAM: Reviewed previously given post op  HEP.  ASSESSMENT: CLINICAL IMPRESSION: Cording was more palpable in the antecubital fossa and axilla today but pt is a bit more swollen due to ending radiation and this should improve.     Pt will benefit from skilled therapeutic intervention to improve on the following deficits: Decreased knowledge of precautions, impaired UE functional use, pain, decreased ROM, postural dysfunction.   PT treatment/interventions: ADL/Self care home management, Therapeutic exercises, Therapeutic activity, Neuromuscular re-education, Balance training, Gait training, Patient/Family education, Self Care, Joint mobilization, Dry Needling, Manual lymph drainage, Compression bandaging, and scar mobilization   GOALS: Goals reviewed with patient? Yes  LONG TERM GOALS:  (STG=LTG)  GOALS Name Target Date  Goal status  1 Pt will demonstrate she has regained full shoulder ROM and function post operatively compared to baselines.  Baseline: 01/12/2022 INITIAL  2 Patient will report 75 % less discomfort with ROM due to cording to demonstrated improvement in function  01/12/2022 INITIAL  3 Pt will report 90% reduction of pain due to improvements in posture, strength, and muscle length  01/12/2022 INITIAL  4 Pt will be independent with advanced HEP to maintain improvements made throughout therapy    01/12/2022 INITIAL     PLAN:  PT FREQUENCY/DURATION: 1x 6 weeks  PLAN FOR NEXT SESSION: Manual therapy to area of cording, end P/ROM and stretching, supine scap   [facility]  [address]   [place] [id number] [phone]  KShan Levans PT  12/29/21 9:57 AM

## 2021-12-30 ENCOUNTER — Encounter: Payer: Self-pay | Admitting: *Deleted

## 2021-12-30 ENCOUNTER — Ambulatory Visit: Payer: Federal, State, Local not specified - PPO

## 2021-12-30 ENCOUNTER — Encounter: Payer: Self-pay | Admitting: Hematology and Oncology

## 2021-12-31 ENCOUNTER — Ambulatory Visit: Payer: Federal, State, Local not specified - PPO

## 2022-01-01 ENCOUNTER — Ambulatory Visit (HOSPITAL_COMMUNITY)
Admission: RE | Admit: 2022-01-01 | Discharge: 2022-01-01 | Disposition: A | Discharge status: Home / Self Care | Payer: Federal, State, Local not specified - PPO | Source: Ambulatory Visit | Attending: Hematology and Oncology | Admitting: Hematology and Oncology

## 2022-01-01 ENCOUNTER — Ambulatory Visit: Payer: Federal, State, Local not specified - PPO

## 2022-01-01 DIAGNOSIS — I427 Cardiomyopathy due to drug and external agent: Secondary | ICD-10-CM | POA: Insufficient documentation

## 2022-01-01 DIAGNOSIS — C50411 Malignant neoplasm of upper-outer quadrant of right female breast: Secondary | ICD-10-CM | POA: Diagnosis not present

## 2022-01-01 DIAGNOSIS — Z01818 Encounter for other preprocedural examination: Secondary | ICD-10-CM | POA: Diagnosis not present

## 2022-01-01 DIAGNOSIS — Z0189 Encounter for other specified special examinations: Secondary | ICD-10-CM | POA: Diagnosis not present

## 2022-01-01 DIAGNOSIS — Z171 Estrogen receptor negative status [ER-]: Secondary | ICD-10-CM | POA: Diagnosis not present

## 2022-01-01 LAB — ECHOCARDIOGRAM COMPLETE
Area-P 1/2: 4.74 cm2
Calc EF: 52.1 %
MV VTI: 2.92 cm2
S' Lateral: 3.3 cm
Single Plane A2C EF: 45 %
Single Plane A4C EF: 57.9 %

## 2022-01-01 NOTE — Progress Notes (Signed)
  Echocardiogram 2D Echocardiogram has been performed.  Patricia Houston 01/01/2022, 11:16 AM

## 2022-01-05 ENCOUNTER — Ambulatory Visit: Payer: Federal, State, Local not specified - PPO

## 2022-01-07 DIAGNOSIS — L0291 Cutaneous abscess, unspecified: Secondary | ICD-10-CM | POA: Diagnosis not present

## 2022-01-08 ENCOUNTER — Ambulatory Visit: Payer: Federal, State, Local not specified - PPO | Attending: Hematology and Oncology

## 2022-01-08 DIAGNOSIS — M25611 Stiffness of right shoulder, not elsewhere classified: Secondary | ICD-10-CM | POA: Insufficient documentation

## 2022-01-08 DIAGNOSIS — M25511 Pain in right shoulder: Secondary | ICD-10-CM | POA: Diagnosis not present

## 2022-01-08 DIAGNOSIS — Z171 Estrogen receptor negative status [ER-]: Secondary | ICD-10-CM | POA: Insufficient documentation

## 2022-01-08 DIAGNOSIS — R293 Abnormal posture: Secondary | ICD-10-CM | POA: Insufficient documentation

## 2022-01-08 DIAGNOSIS — C50411 Malignant neoplasm of upper-outer quadrant of right female breast: Secondary | ICD-10-CM | POA: Diagnosis not present

## 2022-01-08 NOTE — Therapy (Addendum)
OUTPATIENT PHYSICAL THERAPY BREAST CANCER TREATMENT   Patient Name: Patricia Houston MRN: [id number] DOB:1965/03/13, 57 y.o., female Today's Date: 01/08/2022  END OF SESSION:  PT End of Session - 01/08/22 0919     Visit Number 8    Number of Visits 8    Date for PT Re-Evaluation 01/12/22    PT Start Time 0915   pt arrived late   PT Stop Time 1005    PT Time Calculation (min) 50 min    Activity Tolerance Patient tolerated treatment well;Patient limited by pain   pain at cyst on Rt scap   Behavior During Therapy [facility] for tasks assessed/performed               Past Medical History:  Diagnosis Date   Anemia    TAKES IRON PILLS   Anxiety    Cancer ([place])    right breast DCIS   Depression    Past Surgical History:  Procedure Laterality Date   ABDOMINAL HYSTERECTOMY     BREAST LUMPECTOMY WITH RADIOACTIVE SEED AND SENTINEL LYMPH NODE BIOPSY Right 11/03/2021   Procedure: RIGHT BREAST BRACKETED LUMPECTOMY WITH RADIOACTIVE SEED AND SENTINEL LYMPH NODE BIOPSY;  Surgeon: Erroll Luna, MD;  Location: [place] [facility];  Service: General;  Laterality: Right;   BREAST LUMPECTOMY WITH RADIOACTIVE SEED LOCALIZATION Right 06/14/2018   Procedure: RIGHT BREAST RADIOACTIVE SEED LOCALIZATION LUMPECTOMY X2;  Surgeon: Erroll Luna, MD;  Location: [place];  Service: General;  Laterality: Right;   BREAST LUMPECTOMY WITH RADIOACTIVE SEED LOCALIZATION Left 11/03/2021   Procedure: LEFT BREAST BRACKETED LUMPECTOMY WITH RADIOACTIVE SEED LOCALIZATION;  Surgeon: Erroll Luna, MD;  Location: [place];  Service: General;  Laterality: Left;   IR IMAGING GUIDED PORT INSERTION  06/17/2021   LAPAROSCOPIC ASSISTED VAGINAL HYSTERECTOMY N/A 02/27/2014   Procedure: LAPAROSCOPIC ASSISTED VAGINAL HYSTERECTOMY;  Surgeon: Maeola Sarah. Landry Mellow, MD;  Location: [facility];  Service: Gynecology;  Laterality: N/A;  abdomen to vagina to abdomen   TONSILLECTOMY     Patient Active  Problem List   Diagnosis Date Noted   Vitamin D deficiency 07/09/2021   Iron deficiency anemia 07/09/2021   Hyperlipidemia 07/09/2021   Port-A-Cath in place 06/18/2021   Malignant neoplasm of upper-outer quadrant of right breast in female, estrogen receptor negative ([place]) 05/27/2021   Atypical ductal hyperplasia of right breast 07/18/2018   Genetic testing 03/31/2018   Family history of breast cancer in mother 04/03/2015   Family history of pancreatic cancer 04/03/2015   S/P hysterectomy 02/27/2014   ANEMIA 07/01/2009    PCP: Marilynne Drivers PA   REFERRING PROVIDER:  Dr. Lindi Adie   REFERRING DIAG: Rt breast cancer   THERAPY DIAG:  Malignant neoplasm of upper-outer quadrant of right breast in female, estrogen receptor negative ([place])  Acute pain of right shoulder  Abnormal posture  Stiffness of right shoulder, not elsewhere classified  Rationale for Evaluation and Treatment: Rehabilitation  ONSET DATE: 05/19/2021  SUBJECTIVE:  SUBJECTIVE STATEMENT:   My cording is so much better than it was. I brought my daughter so she can learn how to stretch the cording like you guys do.   PERTINENT HISTORY:  Breast cancer recurrence noted 05/19/21. Hx of Rt lumpectomy x 2 in 2010 with Dr. Brantley Stage.  0/1 lymph node removed. Plan is for neoadjuvant chemotherapy TCHP followed by lumpectomy and SLNB and radiation.   PATIENT GOALS:  Reassess how my recovery is going related to arm function, pain, and swelling.  PAIN:  Are you having pain? Yes 2/10 Rt breast   PRECAUTIONS: Recent Surgery, right UE Lymphedema risk, None  ACTIVITY LEVEL / LEISURE: ride bike, trying hand weights, have a total gym but it has been a few months    OBJECTIVE:   PATIENT SURVEYS:  QUICK DASH: 45.45  OBSERVATIONS: Axillary cording  in the right arm  Incision site healing as expected with the presence of glue   POSTURE:  Rounded shoulder posture   LYMPHEDEMA ASSESSMENT:  UPPER EXTREMITY AROM/PROM:   A/PROM RIGHT   eval   11/24/2021 12/02/21 12/29/21  Shoulder extension 60 55    Shoulder flexion 150 119 tight and pulling  145- upper arm pull 160  Shoulder abduction 165 - top of the shoulder and upper arm feels tight 121- tight and pulling  135 - upper arm pull 155 - some cording pain still   Shoulder internal rotation       Shoulder external rotation 91 80                            (Blank rows = not tested)   A/PROM LEFT   eval 11/24/2021  Shoulder extension 60 70  Shoulder flexion 150 151  Shoulder abduction 165 165  Shoulder internal rotation     Shoulder external rotation                             (Blank rows = not tested)   SHOULDER STRENGTH: Flexion: 5/5 bil  Abduction: 4+/5 bil with some pain on the top of the Rt shoulder ER: 5/5 IR:5/5   CERVICAL AROM: All within normal limits:    LYMPHEDEMA ASSESSMENTS:    LANDMARK RIGHT   eval 11/24/2021  10 cm proximal to olecranon process 33.5 31  Olecranon process 27 25.6  10 cm proximal to ulnar styloid process 22 21.5  Just proximal to ulnar styloid process 15.7 15.1  Across hand at thumb web space 18.5 17.6  At base of 2nd digit 5.9 5.5  (Blank rows = not tested)   LANDMARK LEFT   eval  10 cm proximal to olecranon process 33.5  Olecranon process 27  10 cm proximal to ulnar styloid process 22  Just proximal to ulnar styloid process 15.4  Across hand at thumb web space 18.7  At base of 2nd digit 6.0  (Blank rows = not tested)  Surgery type/Date: 11/03/2021 right and left  breast lumpectomy  Number of lymph nodes removed: 4 Current/past treatment (chemo, radiation, hormone therapy): chemotherapy, radiation, herceptin and perjeta   TODAY'S TREATMENT: 01/08/22: Therapeutic Exercises pulleys into flexion and abduction x 2 min each  returning therapist demo Ball roll up wall into flexion and Rt UE abd x10 each Modified downward dog on wall 5x, 5 sec holds On large mat table on half foam roll: Bil UE horz abd and then bil UE "V" scaption  x10 each returning therapist demo Manual Therapy MFR at Rt antecubital fossa, not palpable at medial upper arm today, instructed daughter how to perform this and had her return demo PROM of Rt UE into flexion, and abduction; pt limited with this today due to cyst on her Rt scapula that opened and is draining Held STM due to radiation redness   12/29/21: Therapeutic Exercises pulleys into flexion and abduction x 2 min each returning therapist demo On large mat table: Pectoralis stretch x 60" palms up Pectoralis minor stretch x 60" goal post arms bil horz abd and then abd with "snow angel" x5 each Alternating flexion x 10 bil Yellow band bil ER x 10, horizontal abduction x 10 Manual Therapy MFR at Rt axilla and upper arm, cording very mildly palpable today PROM of Rt UE into flexion, abduction, and D2  Held STM due to radiation redness    12/22/21: Therapeutic Exercises pulleys into flexion and abduction x 2 min each returning therapist demo On large mat table: bil horz abd and then abd with "snow angel" x10 each Supine on mat table LTR arms out to the side at around 90deg  x5 each, 20" each   Alternating flexion x 10 bil Yellow band bil ER x 5, horizontal abduction x 5 Manual Therapy Measured SOZO due to subjective feelings of edema which is slightly elevated but still in the green.  Measured pt for size 2 max juzo soft to order if she would like - will consider.   MFR at Rt axilla and upper arm, cording very mildly palpable today PROM of Rt UE into flexion, abduction, and D2  Held STM due to radiation redness   Pt noted small painful bump on her back and this was noted to be under an old cyst removal scar - most likely returning cyst.  PT also noted one in the axilla that  felt the same but not painful.  Pt will led oncologist know about this.   12/08/21:  pulleys into flexion x 61mn with initial instruction Supine dowel flexion x 10 Supine chest stretch 5" x 3 LTR arms out to the side at around 90deg 6" x5 each   MT: Cording release in the upper arm and axilla at an abduction angle that is tolerated.  PROM of the arm into flexion, abduction, and D2 MLD of the arm post release to decrease soreness.   Gave tg soft medium with large upper arm fold for comfort trial  PATIENT EDUCATION:  Education details: ABCC, compression garments, scar massage, SOZO, posture, POC and HEP Person educated: Patient Education method: Explanation and Handouts Education comprehension: verbalized understanding  HOME EXERCISE PROGRAM: Reviewed previously given post op HEP.  ASSESSMENT: CLINICAL IMPRESSION: Continued with AA/ROM of Rt UE then also continued with manual therapy. Cording was palpable at antecubital fossa so focused MFR here today. Daughter present and instructed her as well. Pt has a cyst that has opened and she is currently being treated for on her Rt scapula. Gave her an Allevyn bandage to place over this today and wrapper in case she wants to order more of these. Was limited in applying scapular depression and end P/ROM of Rt shoulder due to tenderness of cyst on scapula. Pt agrees to return for at least one more visit, preferably when cyst has healed some so we can progress her HEP to supine scapular series, which she would not have tolerated today.   Pt will benefit from skilled therapeutic intervention to improve on the  following deficits: Decreased knowledge of precautions, impaired UE functional use, pain, decreased ROM, postural dysfunction.   PT treatment/interventions: ADL/Self care home management, Therapeutic exercises, Therapeutic activity, Neuromuscular re-education, Balance training, Gait training, Patient/Family education, Self Care, Joint mobilization,  Dry Needling, Manual lymph drainage, Compression bandaging, and scar mobilization   GOALS: Goals reviewed with patient? Yes  LONG TERM GOALS:  (STG=LTG)  GOALS Name Target Date  Goal status  1 Pt will demonstrate she has regained full shoulder ROM and function post operatively compared to baselines.  Baseline: 01/12/2022 INITIAL  2 Patient will report 75 % less discomfort with ROM due to cording to demonstrated improvement in function  01/12/2022 MET 75-80% improvement reported by pt - 01/08/22  3 Pt will report 90% reduction of pain due to improvements in posture, strength, and muscle length  01/12/2022 PROGRESS TOWARDS GOAL 80-90% reported - 01/08/22   4 Pt will be independent with advanced HEP to maintain improvements made throughout therapy    01/12/2022 PARTIALLY MET Pt is independent with current HEP - 01/08/22     PLAN:  PT FREQUENCY/DURATION: 1x 6 weeks  PLAN FOR NEXT SESSION: May D/C next; How is daughter doing with MFR? Progress HEP to supine scap series if here cyst is some healed and she can tolerate; Manual therapy to area of cording, end P/ROM and stretching   [facility]  [address]  [place] [id number]  [phone]   Collie Siad, PTA 01/08/22 11:05 AM

## 2022-01-08 NOTE — Radiation Completion Notes (Signed)
Patient Name: Patricia Houston, Patricia Houston MRN: [id number] Date of Birth: May 21, 1965 Referring Physician: Nicholas Lose, M.D. Date of Service: 2022-01-08 Radiation Oncologist: Eppie Gibson, M.D. [place]                             Radiation Oncology End of Treatment Note     Diagnosis: C50.411 Malignant neoplasm of upper-outer quadrant of right female breast Staging on 2021-05-27: Malignant neoplasm of upper-outer quadrant of right breast in female, estrogen receptor negative ([place]) T=cT2, N=cN0, M=cM0 Intent: Curative     ==========DELIVERED PLANS==========  First Treatment Date: 2021-12-07 - Last Treatment Date: 2021-12-25   Plan Name: Breast_R Site: Breast, Right Technique: 3D Mode: Photon Dose Per Fraction: 2.67 Gy Prescribed Dose (Delivered / Prescribed): 40.05 Gy / 40.05 Gy Prescribed Fxs (Delivered / Prescribed): 15 / 15     ==========ON TREATMENT VISIT DATES========== 2021-12-07, 2021-12-14, 2021-12-21, 2021-12-23     ==========UPCOMING VISITS==========       ==========APPENDIX - ON TREATMENT VISIT NOTES==========   PatEd 2021-12-07 Ongoing education performed.   ImpPlan 2021-12-07 The patient is tolerating radiation. Continue treatment as planned.   PhysExam 2021-12-07 Alert, no acute distress.   RunningNotes 2021-12-07 12-07-21 education done   ProgNote 2021-12-07 Changes from last week/visit? [ Yes, mild pain in right breast ] Pain? [ Yes, right breast ] Fatigue? [ No ] Skin irritation? [ Yes, sports bra rubbing area ] Using lotions? [ No ] Lymphedema? [ No ] Issues with ROM? [ Yes, improving with excercise ] Need refills: [ No ] Additional  Weekly Progress Notes [ none at this time ]    PatEd 2021-12-14 Ongoing education performed.   ImpPlan 2021-12-14 The patient is tolerating radiation. Continue treatment as planned.   PhysExam 2021-12-14 Alert, no acute distress.   ProgNote 2021-12-14 Changes from last week/visit?  [ No ] Pain? [ No ] Fatigue? [ No ] Skin irritation? [ No ] Using lotions? [ Yes ] Lymphedema? [ No ] Issues with ROM? [ Yes, improving with PT.  ] Need refills: [ No ] Additional  Weekly Progress Notes [  ]    PatEd 2021-12-21 Ongoing education performed.   PhysExam 2021-12-21 Alert, no acute distress.   ImpPlan 2021-12-21 The patient is tolerating radiation. Continue treatment as planned.   ProgNote 2021-12-21 Need refills: [  ] Additional  Weekly Progress Notes [ md saw pt on machine ]    PatEd 2021-12-23 Ongoing education performed.   ImpPlan 2021-12-23 The patient is tolerating radiation. Continue treatment as planned.   PhysExam 2021-12-23 Alert, no acute distress.   ProgNote 2021-12-23 Need refills: [  ] Additional  Weekly Progress Notes [ Patient reports having a small open area to incision to right breast. Noted area to have a small amount of blood. Patient denies any other issues. Antibiotic ointment provided to patient per Dr. Isidore Moos ]

## 2022-01-11 ENCOUNTER — Ambulatory Visit: Payer: Self-pay | Admitting: Surgery

## 2022-01-11 DIAGNOSIS — L720 Epidermal cyst: Secondary | ICD-10-CM | POA: Diagnosis not present

## 2022-01-11 DIAGNOSIS — L089 Local infection of the skin and subcutaneous tissue, unspecified: Secondary | ICD-10-CM | POA: Diagnosis not present

## 2022-01-14 ENCOUNTER — Inpatient Hospital Stay: Payer: Federal, State, Local not specified - PPO | Attending: Hematology and Oncology

## 2022-01-14 VITALS — BP 111/88 | HR 81 | Temp 98.2°F | Resp 16 | Wt 175.5 lb

## 2022-01-14 DIAGNOSIS — Z171 Estrogen receptor negative status [ER-]: Secondary | ICD-10-CM

## 2022-01-14 DIAGNOSIS — C50411 Malignant neoplasm of upper-outer quadrant of right female breast: Secondary | ICD-10-CM | POA: Diagnosis not present

## 2022-01-14 DIAGNOSIS — Z5112 Encounter for antineoplastic immunotherapy: Secondary | ICD-10-CM | POA: Insufficient documentation

## 2022-01-14 DIAGNOSIS — Z79899 Other long term (current) drug therapy: Secondary | ICD-10-CM | POA: Diagnosis not present

## 2022-01-14 MED ORDER — TRASTUZUMAB-DKST CHEMO 150 MG IV SOLR
6.0000 mg/kg | Freq: Once | INTRAVENOUS | Status: AC
  Administered 2022-01-14: 483 mg via INTRAVENOUS
  Filled 2022-01-14: qty 23

## 2022-01-14 MED ORDER — SODIUM CHLORIDE 0.9% FLUSH
10.0000 mL | INTRAVENOUS | Status: DC | PRN
  Administered 2022-01-14: 10 mL

## 2022-01-14 MED ORDER — SODIUM CHLORIDE 0.9 % IV SOLN: Freq: Once | INTRAVENOUS | Status: AC

## 2022-01-14 MED ORDER — ACETAMINOPHEN 325 MG PO TABS
650.0000 mg | ORAL_TABLET | Freq: Once | ORAL | Status: AC
  Administered 2022-01-14: 650 mg via ORAL
  Filled 2022-01-14: qty 2

## 2022-01-14 MED ORDER — HEPARIN SOD (PORK) LOCK FLUSH 100 UNIT/ML IV SOLN
500.0000 [IU] | Freq: Once | INTRAVENOUS | Status: AC | PRN
  Administered 2022-01-14: 500 [IU]

## 2022-01-14 MED ORDER — DIPHENHYDRAMINE HCL 25 MG PO CAPS
25.0000 mg | ORAL_CAPSULE | Freq: Once | ORAL | Status: AC
  Administered 2022-01-14: 25 mg via ORAL
  Filled 2022-01-14: qty 1

## 2022-01-14 MED ORDER — SODIUM CHLORIDE 0.9 % IV SOLN
420.0000 mg | Freq: Once | INTRAVENOUS | Status: AC
  Administered 2022-01-14: 420 mg via INTRAVENOUS
  Filled 2022-01-14: qty 14

## 2022-01-14 NOTE — Patient Instructions (Signed)
[facility]  Discharge Instructions: Thank you for choosing [facility] to provide your oncology and hematology care.   If you have a lab appointment with the [facility], please go directly to the [facility] and check in at the registration area.   Wear comfortable clothing and clothing appropriate for easy access to any Portacath or PICC line.   We strive to give you quality time with your provider. You may need to reschedule your appointment if you arrive late (15 or more minutes).  Arriving late affects you and other patients whose appointments are after yours.  Also, if you miss three or more appointments without notifying the office, you may be dismissed from the clinic at the provider's discretion.      For prescription refill requests, have your pharmacy contact our office and allow 72 hours for refills to be completed.    Today you received the following chemotherapy and/or immunotherapy agents: trastuzumab and pertuzumab      To help prevent nausea and vomiting after your treatment, we encourage you to take your nausea medication as directed.  BELOW ARE SYMPTOMS THAT SHOULD BE REPORTED IMMEDIATELY: *FEVER GREATER THAN 100.4 F (38 C) OR HIGHER *CHILLS OR SWEATING *NAUSEA AND VOMITING THAT IS NOT CONTROLLED WITH YOUR NAUSEA MEDICATION *UNUSUAL SHORTNESS OF BREATH *UNUSUAL BRUISING OR BLEEDING *URINARY PROBLEMS (pain or burning when urinating, or frequent urination) *BOWEL PROBLEMS (unusual diarrhea, constipation, pain near the anus) TENDERNESS IN MOUTH AND THROAT WITH OR WITHOUT PRESENCE OF ULCERS (sore throat, sores in mouth, or a toothache) UNUSUAL RASH, SWELLING OR PAIN  UNUSUAL VAGINAL DISCHARGE OR ITCHING   Items with * indicate a potential emergency and should be followed up as soon as possible or go to the Emergency Department if any problems should occur.  Please show the CHEMOTHERAPY ALERT CARD or IMMUNOTHERAPY ALERT  CARD at check-in to the Emergency Department and triage nurse.  Should you have questions after your visit or need to cancel or reschedule your appointment, please contact [facility]  Dept: [phone]  and follow the prompts.  Office hours are 8:00 a.m. to 4:30 p.m. Monday - Friday. Please note that voicemails left after 4:00 p.m. may not be returned until the following business day.  We are closed weekends and major holidays. You have access to a nurse at all times for urgent questions. Please call the main number to the clinic Dept: [phone] and follow the prompts.   For any non-urgent questions, you may also contact your provider using MyChart. We now offer e-Visits for anyone 31 and older to request care online for non-urgent symptoms. For details visit [email or web address].   Also download the MyChart app! Go to the app store, search "MyChart", open the app, select [facility], and log in with your MyChart username and password.

## 2022-01-15 ENCOUNTER — Ambulatory Visit: Payer: Federal, State, Local not specified - PPO | Admitting: Rehabilitation

## 2022-01-15 ENCOUNTER — Encounter: Payer: Self-pay | Admitting: Rehabilitation

## 2022-01-15 DIAGNOSIS — C50411 Malignant neoplasm of upper-outer quadrant of right female breast: Secondary | ICD-10-CM | POA: Diagnosis not present

## 2022-01-15 DIAGNOSIS — R293 Abnormal posture: Secondary | ICD-10-CM | POA: Diagnosis not present

## 2022-01-15 DIAGNOSIS — M25611 Stiffness of right shoulder, not elsewhere classified: Secondary | ICD-10-CM

## 2022-01-15 DIAGNOSIS — Z171 Estrogen receptor negative status [ER-]: Secondary | ICD-10-CM | POA: Diagnosis not present

## 2022-01-15 DIAGNOSIS — M25511 Pain in right shoulder: Secondary | ICD-10-CM

## 2022-01-15 NOTE — Therapy (Signed)
OUTPATIENT PHYSICAL THERAPY BREAST CANCER TREATMENT   Patient Name: Patricia Houston MRN: [id number] DOB:Apr 28, 1965, 57 y.o., female Today's Date: 01/15/2022  END OF SESSION:  PT End of Session - 01/15/22 0908     Visit Number 9    Number of Visits 8    Date for PT Re-Evaluation 01/12/22    PT Start Time 0908    PT Stop Time 0957    PT Time Calculation (min) 49 min    Activity Tolerance Patient tolerated treatment well    Behavior During Therapy WFL for tasks assessed/performed               Past Medical History:  Diagnosis Date   Anemia    TAKES IRON PILLS   Anxiety    Cancer ([place])    right breast DCIS   Depression    Past Surgical History:  Procedure Laterality Date   ABDOMINAL HYSTERECTOMY     BREAST LUMPECTOMY WITH RADIOACTIVE SEED AND SENTINEL LYMPH NODE BIOPSY Right 11/03/2021   Procedure: RIGHT BREAST BRACKETED LUMPECTOMY WITH RADIOACTIVE SEED AND SENTINEL LYMPH NODE BIOPSY;  Surgeon: Erroll Luna, MD;  Location: [facility];  Service: General;  Laterality: Right;   BREAST LUMPECTOMY WITH RADIOACTIVE SEED LOCALIZATION Right 06/14/2018   Procedure: RIGHT BREAST RADIOACTIVE SEED LOCALIZATION LUMPECTOMY X2;  Surgeon: Erroll Luna, MD;  Location: [facility];  Service: General;  Laterality: Right;   BREAST LUMPECTOMY WITH RADIOACTIVE SEED LOCALIZATION Left 11/03/2021   Procedure: LEFT BREAST BRACKETED LUMPECTOMY WITH RADIOACTIVE SEED LOCALIZATION;  Surgeon: Erroll Luna, MD;  Location: [facility];  Service: General;  Laterality: Left;   IR IMAGING GUIDED PORT INSERTION  06/17/2021   LAPAROSCOPIC ASSISTED VAGINAL HYSTERECTOMY N/A 02/27/2014   Procedure: LAPAROSCOPIC ASSISTED VAGINAL HYSTERECTOMY;  Surgeon: Maeola Sarah. Landry Mellow, MD;  Location: [facility];  Service: Gynecology;  Laterality: N/A;  abdomen to vagina to abdomen   TONSILLECTOMY     Patient Active Problem List   Diagnosis Date Noted   Vitamin D deficiency  07/09/2021   Iron deficiency anemia 07/09/2021   Hyperlipidemia 07/09/2021   Port-A-Cath in place 06/18/2021   Malignant neoplasm of upper-outer quadrant of right breast in female, estrogen receptor negative ([place]) 05/27/2021   Atypical ductal hyperplasia of right breast 07/18/2018   Genetic testing 03/31/2018   Family history of breast cancer in mother 04/03/2015   Family history of pancreatic cancer 04/03/2015   S/P hysterectomy 02/27/2014   ANEMIA 07/01/2009    PCP: Marilynne Drivers PA   REFERRING PROVIDER:  Dr. Lindi Adie   REFERRING DIAG: Rt breast cancer   THERAPY DIAG:  Malignant neoplasm of upper-outer quadrant of right breast in female, estrogen receptor negative ([place])  Acute pain of right shoulder  Stiffness of right shoulder, not elsewhere classified  Abnormal posture  Rationale for Evaluation and Treatment: Rehabilitation  ONSET DATE: 05/19/2021  SUBJECTIVE:  SUBJECTIVE STATEMENT:   I think I am doing okay.    PERTINENT HISTORY:  Breast cancer recurrence noted 05/19/21. Hx of Rt lumpectomy x 2 in 2010 with Dr. Brantley Stage.  0/1 lymph node removed. Plan is for neoadjuvant chemotherapy TCHP followed by lumpectomy and SLNB and radiation.   PATIENT GOALS:  Reassess how my recovery is going related to arm function, pain, and swelling.  PAIN:  Are you having pain? Yes 0/10 Rt breast   PRECAUTIONS: Recent Surgery, right UE Lymphedema risk, None  ACTIVITY LEVEL / LEISURE: ride bike, trying hand weights, have a total gym but it has been a few months    OBJECTIVE:   PATIENT SURVEYS:  QUICK DASH: 45.45  OBSERVATIONS: Axillary cording in the right arm  Incision site healing as expected with the presence of glue   POSTURE:  Rounded shoulder posture   LYMPHEDEMA ASSESSMENT:  UPPER  EXTREMITY AROM/PROM:   A/PROM RIGHT   eval   11/24/2021 12/02/21 12/29/21 01/15/22  Shoulder extension 60 55     Shoulder flexion 150 119 tight and pulling  145- upper arm pull 160 160  Shoulder abduction 165 - top of the shoulder and upper arm feels tight 121- tight and pulling  135 - upper arm pull 155 - some cording pain still  160 - a little pain at 90  Shoulder internal rotation        Shoulder external rotation 91 80                             (Blank rows = not tested)   A/PROM LEFT   eval 11/24/2021  Shoulder extension 60 70  Shoulder flexion 150 151  Shoulder abduction 165 165  Shoulder internal rotation     Shoulder external rotation                             (Blank rows = not tested)   SHOULDER STRENGTH: Flexion: 5/5 bil  Abduction: 4+/5 bil with some pain on the top of the Rt shoulder ER: 5/5 IR:5/5   CERVICAL AROM: All within normal limits:    LYMPHEDEMA ASSESSMENTS:    LANDMARK RIGHT   eval 11/24/2021  10 cm proximal to olecranon process 33.5 31  Olecranon process 27 25.6  10 cm proximal to ulnar styloid process 22 21.5  Just proximal to ulnar styloid process 15.7 15.1  Across hand at thumb web space 18.5 17.6  At base of 2nd digit 5.9 5.5  (Blank rows = not tested)   LANDMARK LEFT   eval  10 cm proximal to olecranon process 33.5  Olecranon process 27  10 cm proximal to ulnar styloid process 22  Just proximal to ulnar styloid process 15.4  Across hand at thumb web space 18.7  At base of 2nd digit 6.0  (Blank rows = not tested)  Surgery type/Date: 11/03/2021 right and left  breast lumpectomy  Number of lymph nodes removed: 4 Current/past treatment (chemo, radiation, hormone therapy): chemotherapy, radiation, herceptin and perjeta   TODAY'S TREATMENT: 01/15/22: Therapeutic Exercises Supine Scap for HEP: Bil UE horz abd x 10 ER bil x 10 Diagonals x 10 bil Standing band flexion x 10 yellow Manual Therapy MFR at Rt antecubital fossa, not  palpable at medial upper arm today  01/08/22: Therapeutic Exercises pulleys into flexion and abduction x 2 min each returning therapist demo Ball roll up wall  into flexion and Rt UE abd x10 each Modified downward dog on wall 5x, 5 sec holds On large mat table on half foam roll: Bil UE horz abd and then bil UE "V" scaption x10 each returning therapist demo Manual Therapy MFR at Rt antecubital fossa, not palpable at medial upper arm today, instructed daughter how to perform this and had her return demo PROM of Rt UE into flexion, and abduction; pt limited with this today due to cyst on her Rt scapula that opened and is draining Held STM due to radiation redness   12/29/21: Therapeutic Exercises pulleys into flexion and abduction x 2 min each returning therapist demo On large mat table: Pectoralis stretch x 60" palms up Pectoralis minor stretch x 60" goal post arms bil horz abd and then abd with "snow angel" x5 each Alternating flexion x 10 bil Yellow band bil ER x 10, horizontal abduction x 10 Manual Therapy MFR at Rt axilla and upper arm, cording very mildly palpable today PROM of Rt UE into flexion, abduction, and D2  Held STM due to radiation redness    12/22/21: Therapeutic Exercises pulleys into flexion and abduction x 2 min each returning therapist demo On large mat table: bil horz abd and then abd with "snow angel" x10 each Supine on mat table LTR arms out to the side at around 90deg  x5 each, 20" each   Alternating flexion x 10 bil Yellow band bil ER x 5, horizontal abduction x 5 Manual Therapy Measured SOZO due to subjective feelings of edema which is slightly elevated but still in the green.  Measured pt for size 2 max juzo soft to order if she would like - will consider.   MFR at Rt axilla and upper arm, cording very mildly palpable today PROM of Rt UE into flexion, abduction, and D2  Held STM due to radiation redness   Pt noted small painful bump on her back and this  was noted to be under an old cyst removal scar - most likely returning cyst.  PT also noted one in the axilla that felt the same but not painful.  Pt will led oncologist know about this.   12/08/21:  pulleys into flexion x 31mn with initial instruction Supine dowel flexion x 10 Supine chest stretch 5" x 3 LTR arms out to the side at around 90deg 6" x5 each   MT: Cording release in the upper arm and axilla at an abduction angle that is tolerated.  PROM of the arm into flexion, abduction, and D2 MLD of the arm post release to decrease soreness.   Gave tg soft medium with large upper arm fold for comfort trial  PATIENT EDUCATION:  Education details: ABCC, compression garments, scar massage, SOZO, posture, POC and HEP Person educated: Patient Education method: Explanation and Handouts Education comprehension: verbalized understanding  HOME EXERCISE PROGRAM: Reviewed previously given post op HEP. Supine scap series, standing extension, standing flexion all yellow band   ASSESSMENT: CLINICAL IMPRESSION: Pt is ready for DC with HEP and self care but is still scheduled for continued SOZO an dknows she can return at any time.   Pt will benefit from skilled therapeutic intervention to improve on the following deficits: Decreased knowledge of precautions, impaired UE functional use, pain, decreased ROM, postural dysfunction.   PT treatment/interventions: ADL/Self care home management, Therapeutic exercises, Therapeutic activity, Neuromuscular re-education, Balance training, Gait training, Patient/Family education, Self Care, Joint mobilization, Dry Needling, Manual lymph drainage, Compression bandaging, and scar mobilization  GOALS: Goals reviewed with patient? Yes  LONG TERM GOALS:  (STG=LTG)  GOALS Name Target Date  Goal status  1 Pt will demonstrate she has regained full shoulder ROM and function post operatively compared to baselines.  Baseline: 01/12/2022 MET  2 Patient will report  75 % less discomfort with ROM due to cording to demonstrated improvement in function  01/12/2022 MET 75-80% improvement reported by pt - 01/08/22  3 Pt will report 90% reduction of pain due to improvements in posture, strength, and muscle length  01/12/2022 MET 80-90% reported - 01/08/22   4 Pt will be independent with advanced HEP to maintain improvements made throughout therapy    01/12/2022 MET Pt is independent with current HEP - 01/08/22     PLAN:  PT FREQUENCY/DURATION: 1x 6 weeks  PLAN FOR NEXT SESSION: May D/C next; How is daughter doing with MFR? Progress HEP to supine scap series if here cyst is some healed and she can tolerate; Manual therapy to area of cording, end P/ROM and stretching   [facility]  [address]  [place] [address]  [phone]   Shan Levans, PT  01/15/22 9:57 AM  PHYSICAL THERAPY DISCHARGE SUMMARY  Visits from Start of Care: 9  Current functional level related to goals / functional outcomes: See above   Remaining deficits: Lymphedema risk, healing from radiation   Education / Equipment: Final HEP  Plan: Patient agrees to discharge. Patient is being discharged due to meeting the stated rehab goals.

## 2022-01-16 ENCOUNTER — Other Ambulatory Visit: Payer: Self-pay

## 2022-01-21 ENCOUNTER — Encounter (HOSPITAL_COMMUNITY): Payer: Self-pay

## 2022-01-28 ENCOUNTER — Telehealth: Payer: Self-pay

## 2022-01-28 ENCOUNTER — Ambulatory Visit
Admission: RE | Admit: 2022-01-28 | Discharge: 2022-01-28 | Disposition: A | Discharge status: Home / Self Care | Payer: Federal, State, Local not specified - PPO | Source: Ambulatory Visit | Attending: Radiation Oncology | Admitting: Radiation Oncology

## 2022-01-28 NOTE — Telephone Encounter (Signed)
Patricia Houston was called today for follow-up after completing radiation to her right breast on 12-25-21.   Pain: reports intermittent shooting pains in left breast, at times worse than others. She reports these are improving and will call if she needs follow up on this concern.  Skin: reports good wound healing overall though some discoloration remains ROM:  reports right arm stiffness, she is working with physical therapy on a plan for exercises and will follow up if more physical therapy is needed Lymphedema: none at this time MedOnc F/U: Dr. Lindi Adie on 02-04-22 Other issues of note: no major concerns or questions.   Pt reports Yes No Comments  Tamoxifen '[]'$  '[x]'$    Letrozole '[]'$  '[x]'$    Anastrazole '[]'$  '[x]'$    Mammogram '[]'$  Date:  '[x]'$ Pt will schedule this    Rn called pt and she was doing well overall. She did report that her right breast is still tender and swollen at times, but nothing of major concern. Pt was educated on use of Vitamin E cream/lotion and the importance of yearly mammograms. Pt will call with any major concerns or questions in the future.

## 2022-02-02 ENCOUNTER — Other Ambulatory Visit: Payer: Self-pay | Admitting: *Deleted

## 2022-02-02 DIAGNOSIS — Z171 Estrogen receptor negative status [ER-]: Secondary | ICD-10-CM

## 2022-02-03 NOTE — Assessment & Plan Note (Signed)
06/14/2018: Right lumpectomy: Intraductal papilloma with usual ductal hyperplasia, 1 benign lymph node (decided against tamoxifen) 05/19/2021: Screening mammogram detected right breast density.  2 lymph nodes were noted biopsy appointment was benign.  Ultrasound revealed 2.2 cm mass at 11 o'clock position right breast biopsy: Grade 3 IDC with necrosis, ER 0%, PR 0%, Ki-67 60%, HER2 3+ positive by IHC   Treatment Plan: 1. Neoadjuvant chemotherapy with TCH Perjeta 6 cycles followed by Herceptin Perjeta maintenance versus Kadcyla maintenance (based on response to neoadjuvant chemo) for 1 year 2. 11/03/2021: Right lumpectomy: Pathologic complete response, 0/4 lymph nodes, margins negative 3. Followed by adjuvant radiation therapy 12/08/2021-12/25/2021 ------------------------------------------------------------------------------------------------------------------------------- Treatment plan: Continue with Herceptin Perjeta maintenance Herceptin Perjeta toxicities: None Return to clinic every 3 weeks for Herceptin and Perjeta and every 6 weeks for follow-up with me. 

## 2022-02-04 ENCOUNTER — Inpatient Hospital Stay (HOSPITAL_BASED_OUTPATIENT_CLINIC_OR_DEPARTMENT_OTHER): Payer: Federal, State, Local not specified - PPO | Admitting: Hematology and Oncology

## 2022-02-04 ENCOUNTER — Inpatient Hospital Stay: Payer: Federal, State, Local not specified - PPO

## 2022-02-04 ENCOUNTER — Inpatient Hospital Stay: Payer: Federal, State, Local not specified - PPO | Attending: Hematology and Oncology

## 2022-02-04 VITALS — BP 107/75 | HR 87 | Resp 18

## 2022-02-04 VITALS — BP 130/64 | HR 79 | Temp 97.5°F | Resp 18 | Wt 172.2 lb

## 2022-02-04 DIAGNOSIS — Z171 Estrogen receptor negative status [ER-]: Secondary | ICD-10-CM | POA: Insufficient documentation

## 2022-02-04 DIAGNOSIS — Z79899 Other long term (current) drug therapy: Secondary | ICD-10-CM | POA: Insufficient documentation

## 2022-02-04 DIAGNOSIS — Z5112 Encounter for antineoplastic immunotherapy: Secondary | ICD-10-CM | POA: Insufficient documentation

## 2022-02-04 DIAGNOSIS — R197 Diarrhea, unspecified: Secondary | ICD-10-CM | POA: Diagnosis not present

## 2022-02-04 DIAGNOSIS — C50411 Malignant neoplasm of upper-outer quadrant of right female breast: Secondary | ICD-10-CM

## 2022-02-04 DIAGNOSIS — D72819 Decreased white blood cell count, unspecified: Secondary | ICD-10-CM | POA: Diagnosis not present

## 2022-02-04 LAB — CMP (CANCER CENTER ONLY)
ALT: 13 U/L (ref 0–44)
AST: 14 U/L — ABNORMAL LOW (ref 15–41)
Albumin: 3.8 g/dL (ref 3.5–5.0)
Alkaline Phosphatase: 107 U/L (ref 38–126)
Anion gap: 6 (ref 5–15)
BUN: 17 mg/dL (ref 6–20)
CO2: 29 mmol/L (ref 22–32)
Calcium: 9.9 mg/dL (ref 8.9–10.3)
Chloride: 105 mmol/L (ref 98–111)
Creatinine: 1.04 mg/dL — ABNORMAL HIGH (ref 0.44–1.00)
GFR, Estimated: >60 mL/min (ref >60)
Glucose, Bld: 90 mg/dL (ref 70–99)
Potassium: 3.8 mmol/L (ref 3.5–5.1)
Sodium: 140 mmol/L (ref 135–145)
Total Bilirubin: 0.4 mg/dL (ref 0.3–1.2)
Total Protein: 7.1 g/dL (ref 6.5–8.1)

## 2022-02-04 LAB — CBC WITH DIFFERENTIAL (CANCER CENTER ONLY)
Abs Immature Granulocytes: 0 10*3/uL (ref 0.00–0.07)
Basophils Absolute: 0 10*3/uL (ref 0.0–0.1)
Basophils Relative: 1 %
Eosinophils Absolute: 0.1 10*3/uL (ref 0.0–0.5)
Eosinophils Relative: 5 %
HCT: 31 % — ABNORMAL LOW (ref 36.0–46.0)
Hemoglobin: 10.4 g/dL — ABNORMAL LOW (ref 12.0–15.0)
Immature Granulocytes: 0 %
Lymphocytes Relative: 34 %
Lymphs Abs: 0.9 10*3/uL (ref 0.7–4.0)
MCH: 29.5 pg (ref 26.0–34.0)
MCHC: 33.5 g/dL (ref 30.0–36.0)
MCV: 87.8 fL (ref 80.0–100.0)
Monocytes Absolute: 0.2 10*3/uL (ref 0.1–1.0)
Monocytes Relative: 9 %
Neutro Abs: 1.4 10*3/uL — ABNORMAL LOW (ref 1.7–7.7)
Neutrophils Relative %: 51 %
Platelet Count: 162 10*3/uL (ref 150–400)
RBC: 3.53 MIL/uL — ABNORMAL LOW (ref 3.87–5.11)
RDW: 13.8 % (ref 11.5–15.5)
WBC Count: 2.7 10*3/uL — ABNORMAL LOW (ref 4.0–10.5)
nRBC: 0 % (ref 0.0–0.2)

## 2022-02-04 MED ORDER — SODIUM CHLORIDE 0.9 % IV SOLN: Freq: Once | INTRAVENOUS | Status: AC

## 2022-02-04 MED ORDER — ACETAMINOPHEN 325 MG PO TABS
650.0000 mg | ORAL_TABLET | Freq: Once | ORAL | Status: AC
  Administered 2022-02-04: 650 mg via ORAL
  Filled 2022-02-04: qty 2

## 2022-02-04 MED ORDER — TRASTUZUMAB-DKST CHEMO 150 MG IV SOLR
6.0000 mg/kg | Freq: Once | INTRAVENOUS | Status: AC
  Administered 2022-02-04: 483 mg via INTRAVENOUS
  Filled 2022-02-04: qty 23

## 2022-02-04 MED ORDER — DIPHENHYDRAMINE HCL 25 MG PO CAPS
25.0000 mg | ORAL_CAPSULE | Freq: Once | ORAL | Status: AC
  Administered 2022-02-04: 25 mg via ORAL
  Filled 2022-02-04: qty 1

## 2022-02-04 MED ORDER — SODIUM CHLORIDE 0.9 % IV SOLN
420.0000 mg | Freq: Once | INTRAVENOUS | Status: AC
  Administered 2022-02-04: 420 mg via INTRAVENOUS
  Filled 2022-02-04: qty 14

## 2022-02-04 MED ORDER — HEPARIN SOD (PORK) LOCK FLUSH 100 UNIT/ML IV SOLN
500.0000 [IU] | Freq: Once | INTRAVENOUS | Status: AC | PRN
  Administered 2022-02-04: 500 [IU]

## 2022-02-04 MED ORDER — SODIUM CHLORIDE 0.9% FLUSH
10.0000 mL | INTRAVENOUS | Status: DC | PRN
  Administered 2022-02-04: 10 mL

## 2022-02-04 NOTE — Progress Notes (Signed)
Patient Care Team: Lois Huxley, PA as PCP - General (Family Medicine) Mauro Kaufmann, RN as Oncology Nurse Navigator Rockwell Germany, RN as Oncology Nurse Navigator Erroll Luna, MD as Consulting Physician (General Surgery) Nicholas Lose, MD as Consulting Physician (Hematology and Oncology) Eppie Gibson, MD as Attending Physician (Radiation Oncology)  DIAGNOSIS:  Encounter Diagnosis  Name Primary?   Malignant neoplasm of upper-outer quadrant of right breast in female, estrogen receptor negative ([place]) Yes    SUMMARY OF ONCOLOGIC HISTORY: Oncology History  Malignant neoplasm of upper-outer quadrant of right breast in female, estrogen receptor negative ([place])  06/14/2018 Surgery   06/14/2018: Right lumpectomy: Intraductal papilloma with usual ductal hyperplasia, 1 benign lymph node (decided against tamoxifen)   05/19/2021 Relapse/Recurrence   Screening mammogram detected right breast density.  2 lymph nodes were noted biopsy appointment was benign.  Ultrasound revealed 2.2 cm mass at 11 o'clock position right breast biopsy: Grade 3 IDC with necrosis, ER 0%, PR 0%, Ki-67 60%, HER2 3+ positive by [facility]   05/27/2021 Cancer Staging   Staging form: Breast, AJCC 8th Edition - Clinical: Stage IIA (cT2, cN0, cM0, G3, ER-, PR-, HER2+) - Signed by Nicholas Lose, MD on 05/27/2021 Stage prefix: Initial diagnosis Histologic grading system: 3 grade system   06/18/2021 - 08/22/2021 Chemotherapy   Patient is on Treatment Plan : BREAST  Docetaxel + Carboplatin + Trastuzumab + Pertuzumab  (TCHP) q21d      06/18/2021 -  Chemotherapy   Patient is on Treatment Plan : BREAST  Docetaxel + Carboplatin + Trastuzumab + Pertuzumab  (TCHP) q21d / Trastuzumab + Pertuzumab q21d       CHIEF COMPLIANT: Follow-right breast cancer on herceptin maintenance    INTERVAL HISTORY: Patricia Houston is a 57 y.o. female is here because of recent diagnosis of right breast cancer. She presents to the clinic today for a  follow-up.  She states that she is tolerating the herceptin perjeta. She says she does have mild diarrhea.   ALLERGIES:  has No Known Allergies.  MEDICATIONS:  Current Outpatient Medications  Medication Sig Dispense Refill   Cetirizine HCl (ZYRTEC PO) Take by mouth.     cholecalciferol (VITAMIN D3) 25 MCG (1000 UNIT) tablet Take 1 tablet (1,000 Units total) by mouth daily.     Cyanocobalamin (VITAMIN B-12) 3000 MCG SUBL Place 1 tablet under the tongue daily.     ferrous sulfate 325 (65 FE) MG tablet      ibuprofen (ADVIL) 200 MG tablet Take 200 mg by mouth as needed for moderate pain.     No current facility-administered medications for this visit.   Facility-Administered Medications Ordered in Other Visits  Medication Dose Route Frequency Provider Last Rate Last Admin   acetaminophen (TYLENOL) tablet 650 mg  650 mg Oral Once Nicholas Lose, MD       diphenhydrAMINE (BENADRYL) capsule 25 mg  25 mg Oral Once Nicholas Lose, MD       heparin lock flush 100 unit/mL  500 Units Intracatheter Once PRN Nicholas Lose, MD       pertuzumab (PERJETA) 420 mg in sodium chloride 0.9 % 250 mL chemo infusion  420 mg Intravenous Once Nicholas Lose, MD       sodium chloride flush (NS) 0.9 % injection 10 mL  10 mL Intracatheter PRN Nicholas Lose, MD       trastuzumab-dkst (OGIVRI) 483 mg in sodium chloride 0.9 % 250 mL chemo infusion  6 mg/kg (Treatment Plan Recorded) Intravenous Once Daisy,  Loleta Dicker, MD        PHYSICAL EXAMINATION: ECOG PERFORMANCE STATUS: 1 - Symptomatic but completely ambulatory  Vitals:   02/04/22 1042  BP: 130/64  Pulse: 79  Resp: 18  Temp: (!) 97.5 F (36.4 C)  SpO2: 97%   Filed Weights   02/04/22 1042  Weight: 172 lb 3 oz (78.1 kg)      LABORATORY DATA:  I have reviewed the data as listed    Latest Ref Rng & Units 02/04/2022   10:07 AM 11/19/2021   12:30 PM 10/23/2021    8:26 AM  CMP  Glucose 70 - 99 mg/dL 90  83  105   BUN 6 - 20 mg/dL '17  18  11   '$ Creatinine  0.44 - 1.00 mg/dL 1.04  1.13  1.00   Sodium 135 - 145 mmol/L 140  139  139   Potassium 3.5 - 5.1 mmol/L 3.8  4.0  4.0   Chloride 98 - 111 mmol/L 105  104  106   CO2 22 - 32 mmol/L '29  29  28   '$ Calcium 8.9 - 10.3 mg/dL 9.9  10.5  9.0   Total Protein 6.5 - 8.1 g/dL 7.1  7.5  6.2   Total Bilirubin 0.3 - 1.2 mg/dL 0.4  0.3  0.3   Alkaline Phos 38 - 126 U/L 107  93  85   AST 15 - 41 U/L '14  20  22   '$ ALT 0 - 44 U/L '13  29  30     '$ Lab Results  Component Value Date   WBC 2.7 (L) 02/04/2022   HGB 10.4 (L) 02/04/2022   HCT 31.0 (L) 02/04/2022   MCV 87.8 02/04/2022   PLT 162 02/04/2022   NEUTROABS 1.4 (L) 02/04/2022    ASSESSMENT & PLAN:  Malignant neoplasm of upper-outer quadrant of right breast in female, estrogen receptor negative ([place]) 06/14/2018: Right lumpectomy: Intraductal papilloma with usual ductal hyperplasia, 1 benign lymph node (decided against tamoxifen) 05/19/2021: Screening mammogram detected right breast density.  2 lymph nodes were noted biopsy appointment was benign.  Ultrasound revealed 2.2 cm mass at 11 o'clock position right breast biopsy: Grade 3 IDC with necrosis, ER 0%, PR 0%, Ki-67 60%, HER2 3+ positive by IHC   Treatment Plan: 1. Neoadjuvant chemotherapy with TCH Perjeta 6 cycles followed by Herceptin Perjeta maintenance versus Kadcyla maintenance (based on response to neoadjuvant chemo) for 1 year 2. 11/03/2021: Right lumpectomy: Pathologic complete response, 0/4 lymph nodes, margins negative 3. Followed by adjuvant radiation therapy 12/08/2021-12/25/2021 ------------------------------------------------------------------------------------------------------------------------------- Treatment plan: Continue with Herceptin Perjeta maintenance Herceptin Perjeta toxicities: None  Leukopenia: Unclear etiology.  She took recently antibiotics for a skin infection.  It could be related to that.  We will continue to watch and monitor.  Return to clinic every 3 weeks for  Herceptin and Perjeta and every 6 weeks for follow-up with me.    No orders of the defined types were placed in this encounter.  The patient has a good understanding of the overall plan. she agrees with it. she will call with any problems that may develop before the next visit here. Total time spent: 30 mins including face to face time and time spent for planning, charting and co-ordination of care   Harriette Ohara, MD 02/04/22    I Gardiner Coins am acting as a Education administrator for [employer]  I have reviewed the above documentation for accuracy and completeness, and I agree with the above.

## 2022-02-04 NOTE — Patient Instructions (Signed)
[facility]   Discharge Instructions: Thank you for choosing [facility] to provide your oncology and hematology care.   If you have a lab appointment with the Cancer Center, please go directly to the Cancer Center and check in at the registration area.   Wear comfortable clothing and clothing appropriate for easy access to any Portacath or PICC line.   We strive to give you quality time with your provider. You may need to reschedule your appointment if you arrive late (15 or more minutes).  Arriving late affects you and other patients whose appointments are after yours.  Also, if you miss three or more appointments without notifying the office, you may be dismissed from the clinic at the provider's discretion.      For prescription refill requests, have your pharmacy contact our office and allow 72 hours for refills to be completed.    Today you received the following chemotherapy and/or immunotherapy agents: Trastuzumab (Herceptin) and Pertuzumab (Perjeta)       To help prevent nausea and vomiting after your treatment, we encourage you to take your nausea medication as directed.  BELOW ARE SYMPTOMS THAT SHOULD BE REPORTED IMMEDIATELY: *FEVER GREATER THAN 100.4 F (38 C) OR HIGHER *CHILLS OR SWEATING *NAUSEA AND VOMITING THAT IS NOT CONTROLLED WITH YOUR NAUSEA MEDICATION *UNUSUAL SHORTNESS OF BREATH *UNUSUAL BRUISING OR BLEEDING *URINARY PROBLEMS (pain or burning when urinating, or frequent urination) *BOWEL PROBLEMS (unusual diarrhea, constipation, pain near the anus) TENDERNESS IN MOUTH AND THROAT WITH OR WITHOUT PRESENCE OF ULCERS (sore throat, sores in mouth, or a toothache) UNUSUAL RASH, SWELLING OR PAIN  UNUSUAL VAGINAL DISCHARGE OR ITCHING   Items with * indicate a potential emergency and should be followed up as soon as possible or go to the Emergency Department if any problems should occur.  Please show the CHEMOTHERAPY ALERT  CARD or IMMUNOTHERAPY ALERT CARD at check-in to the Emergency Department and triage nurse.  Should you have questions after your visit or need to cancel or reschedule your appointment, please contact [facility]  Dept: [phone]  and follow the prompts.  Office hours are 8:00 a.m. to 4:30 p.m. Monday - Friday. Please note that voicemails left after 4:00 p.m. may not be returned until the following business day.  We are closed weekends and major holidays. You have access to a nurse at all times for urgent questions. Please call the main number to the clinic Dept: [phone] and follow the prompts.   For any non-urgent questions, you may also contact your provider using MyChart. We now offer e-Visits for anyone 18 and older to request care online for non-urgent symptoms. For details visit [email or web address].   Also download the MyChart app! Go to the app store, search "MyChart", open the app, select [facility], and log in with your MyChart username and password.  

## 2022-02-04 NOTE — Patient Instructions (Signed)

## 2022-02-05 ENCOUNTER — Telehealth: Payer: Self-pay | Admitting: Hematology and Oncology

## 2022-02-05 NOTE — Telephone Encounter (Signed)
Scheduled appointments per WQ. Patient is aware of all made appointments.

## 2022-02-07 ENCOUNTER — Other Ambulatory Visit: Payer: Self-pay

## 2022-02-09 ENCOUNTER — Other Ambulatory Visit: Payer: Self-pay

## 2022-02-09 ENCOUNTER — Encounter (HOSPITAL_BASED_OUTPATIENT_CLINIC_OR_DEPARTMENT_OTHER): Payer: Self-pay | Admitting: Surgery

## 2022-02-12 ENCOUNTER — Other Ambulatory Visit: Payer: Self-pay

## 2022-02-15 NOTE — Progress Notes (Signed)

## 2022-02-16 ENCOUNTER — Encounter (HOSPITAL_BASED_OUTPATIENT_CLINIC_OR_DEPARTMENT_OTHER): Payer: Self-pay | Admitting: Surgery

## 2022-02-16 ENCOUNTER — Other Ambulatory Visit: Payer: Self-pay

## 2022-02-16 ENCOUNTER — Ambulatory Visit (HOSPITAL_BASED_OUTPATIENT_CLINIC_OR_DEPARTMENT_OTHER): Payer: Federal, State, Local not specified - PPO | Admitting: Certified Registered"

## 2022-02-16 ENCOUNTER — Encounter (HOSPITAL_BASED_OUTPATIENT_CLINIC_OR_DEPARTMENT_OTHER)
Admission: RE | Disposition: A | Discharge status: Home / Self Care | Payer: Self-pay | Source: Home / Self Care | Attending: Surgery

## 2022-02-16 ENCOUNTER — Ambulatory Visit (HOSPITAL_BASED_OUTPATIENT_CLINIC_OR_DEPARTMENT_OTHER)
Admission: RE | Admit: 2022-02-16 | Discharge: 2022-02-16 | Disposition: A | Discharge status: Home / Self Care | Payer: Federal, State, Local not specified - PPO | Attending: Surgery | Admitting: Surgery

## 2022-02-16 DIAGNOSIS — Z923 Personal history of irradiation: Secondary | ICD-10-CM | POA: Insufficient documentation

## 2022-02-16 DIAGNOSIS — L720 Epidermal cyst: Secondary | ICD-10-CM | POA: Diagnosis not present

## 2022-02-16 DIAGNOSIS — L729 Follicular cyst of the skin and subcutaneous tissue, unspecified: Secondary | ICD-10-CM | POA: Diagnosis not present

## 2022-02-16 DIAGNOSIS — M7989 Other specified soft tissue disorders: Secondary | ICD-10-CM | POA: Diagnosis not present

## 2022-02-16 HISTORY — DX: Other specified postprocedural states: Z98.890

## 2022-02-16 HISTORY — PX: MASS EXCISION: SHX2000

## 2022-02-16 SURGERY — EXCISION MASS
Anesthesia: General | Site: Back | Laterality: Right

## 2022-02-16 MED ORDER — CHLORHEXIDINE GLUCONATE CLOTH 2 % EX PADS: 6.0000 | MEDICATED_PAD | Freq: Once | CUTANEOUS | Status: DC

## 2022-02-16 MED ORDER — SUGAMMADEX SODIUM 200 MG/2ML IV SOLN
INTRAVENOUS | Status: DC | PRN
  Administered 2022-02-16: 200 mg via INTRAVENOUS

## 2022-02-16 MED ORDER — ONDANSETRON HCL 4 MG/2ML IJ SOLN
INTRAMUSCULAR | Status: AC
  Filled 2022-02-16: qty 2

## 2022-02-16 MED ORDER — OXYCODONE HCL 5 MG PO TABS: 5.0000 mg | ORAL_TABLET | Freq: Four times a day (QID) | ORAL | 0 refills | Status: DC | PRN

## 2022-02-16 MED ORDER — ONDANSETRON HCL 4 MG/2ML IJ SOLN
INTRAMUSCULAR | Status: DC | PRN
  Administered 2022-02-16: 4 mg via INTRAVENOUS

## 2022-02-16 MED ORDER — CEFAZOLIN SODIUM-DEXTROSE 2-4 GM/100ML-% IV SOLN
INTRAVENOUS | Status: AC
  Filled 2022-02-16: qty 100

## 2022-02-16 MED ORDER — DEXAMETHASONE SODIUM PHOSPHATE 4 MG/ML IJ SOLN
INTRAMUSCULAR | Status: DC | PRN
  Administered 2022-02-16: 5 mg via INTRAVENOUS

## 2022-02-16 MED ORDER — DEXAMETHASONE SODIUM PHOSPHATE 10 MG/ML IJ SOLN
INTRAMUSCULAR | Status: AC
  Filled 2022-02-16: qty 1

## 2022-02-16 MED ORDER — BUPIVACAINE HCL (PF) 0.25 % IJ SOLN
INTRAMUSCULAR | Status: DC | PRN
  Administered 2022-02-16: 15 mL

## 2022-02-16 MED ORDER — ROCURONIUM BROMIDE 100 MG/10ML IV SOLN
INTRAVENOUS | Status: DC | PRN
  Administered 2022-02-16: 50 mg via INTRAVENOUS

## 2022-02-16 MED ORDER — LIDOCAINE HCL (CARDIAC) PF 100 MG/5ML IV SOSY
PREFILLED_SYRINGE | INTRAVENOUS | Status: DC | PRN
  Administered 2022-02-16: 60 mg via INTRAVENOUS

## 2022-02-16 MED ORDER — FENTANYL CITRATE (PF) 100 MCG/2ML IJ SOLN
INTRAMUSCULAR | Status: AC
  Filled 2022-02-16: qty 2

## 2022-02-16 MED ORDER — ACETAMINOPHEN 500 MG PO TABS
1000.0000 mg | ORAL_TABLET | ORAL | Status: AC
  Administered 2022-02-16: 1000 mg via ORAL

## 2022-02-16 MED ORDER — PROPOFOL 10 MG/ML IV BOLUS
INTRAVENOUS | Status: DC | PRN
  Administered 2022-02-16: 150 mg via INTRAVENOUS

## 2022-02-16 MED ORDER — LACTATED RINGERS IV SOLN: INTRAVENOUS | Status: DC

## 2022-02-16 MED ORDER — CEFAZOLIN SODIUM-DEXTROSE 2-4 GM/100ML-% IV SOLN
2.0000 g | INTRAVENOUS | Status: AC
  Administered 2022-02-16: 2 g via INTRAVENOUS

## 2022-02-16 MED ORDER — MIDAZOLAM HCL 2 MG/2ML IJ SOLN
INTRAMUSCULAR | Status: AC
  Filled 2022-02-16: qty 2

## 2022-02-16 MED ORDER — IBUPROFEN 800 MG PO TABS: 800.0000 mg | ORAL_TABLET | Freq: Three times a day (TID) | ORAL | 0 refills | Status: DC | PRN

## 2022-02-16 MED ORDER — FENTANYL CITRATE (PF) 100 MCG/2ML IJ SOLN: 25.0000 ug | INTRAMUSCULAR | Status: DC | PRN

## 2022-02-16 MED ORDER — ACETAMINOPHEN 500 MG PO TABS
ORAL_TABLET | ORAL | Status: AC
  Filled 2022-02-16: qty 2

## 2022-02-16 MED ORDER — MIDAZOLAM HCL 5 MG/5ML IJ SOLN
INTRAMUSCULAR | Status: DC | PRN
  Administered 2022-02-16: 2 mg via INTRAVENOUS

## 2022-02-16 MED ORDER — FENTANYL CITRATE (PF) 100 MCG/2ML IJ SOLN
INTRAMUSCULAR | Status: DC | PRN
  Administered 2022-02-16: 50 ug via INTRAVENOUS

## 2022-02-16 SURGICAL SUPPLY — 43 items
ADH SKN CLS APL DERMABOND .7 (GAUZE/BANDAGES/DRESSINGS) ×1
APL PRP STRL LF DISP 70% ISPRP (MISCELLANEOUS) ×1
APL SKNCLS STERI-STRIP NONHPOA (GAUZE/BANDAGES/DRESSINGS)
BENZOIN TINCTURE PRP APPL 2/3 (GAUZE/BANDAGES/DRESSINGS) IMPLANT
BLADE SURG 15 STRL LF DISP TIS (BLADE) ×1 IMPLANT
BLADE SURG 15 STRL SS (BLADE) ×1
BNDG ELASTIC 4X5.8 VLCR STR LF (GAUZE/BANDAGES/DRESSINGS) IMPLANT
CANISTER SUCT 1200ML W/VALVE (MISCELLANEOUS) IMPLANT
CHLORAPREP W/TINT 26 (MISCELLANEOUS) ×1 IMPLANT
COVER BACK TABLE 60X90IN (DRAPES) ×1 IMPLANT
COVER MAYO STAND STRL (DRAPES) ×1 IMPLANT
DERMABOND ADVANCED .7 DNX12 (GAUZE/BANDAGES/DRESSINGS) IMPLANT
DRAPE LAPAROTOMY 100X72 PEDS (DRAPES) ×1 IMPLANT
DRAPE UTILITY XL STRL (DRAPES) ×1 IMPLANT
ELECT COATED BLADE 2.86 ST (ELECTRODE) ×1 IMPLANT
ELECT REM PT RETURN 9FT ADLT (ELECTROSURGICAL) ×1
ELECTRODE REM PT RTRN 9FT ADLT (ELECTROSURGICAL) ×1 IMPLANT
GLOVE BIO SURGEON STRL SZ 6.5 (GLOVE) IMPLANT
GLOVE BIOGEL PI IND STRL 8 (GLOVE) ×1 IMPLANT
GLOVE ECLIPSE 8.0 STRL XLNG CF (GLOVE) ×1 IMPLANT
GOWN STRL REUS W/ TWL LRG LVL3 (GOWN DISPOSABLE) ×2 IMPLANT
GOWN STRL REUS W/ TWL XL LVL3 (GOWN DISPOSABLE) ×1 IMPLANT
GOWN STRL REUS W/TWL LRG LVL3 (GOWN DISPOSABLE) ×2
GOWN STRL REUS W/TWL XL LVL3 (GOWN DISPOSABLE) ×1
NDL HYPO 25X1 1.5 SAFETY (NEEDLE) ×1 IMPLANT
NEEDLE HYPO 25X1 1.5 SAFETY (NEEDLE) ×1 IMPLANT
NS IRRIG 1000ML POUR BTL (IV SOLUTION) IMPLANT
PACK BASIN DAY SURGERY FS (CUSTOM PROCEDURE TRAY) ×1 IMPLANT
PENCIL SMOKE EVACUATOR (MISCELLANEOUS) ×1 IMPLANT
SLEEVE SCD COMPRESS KNEE MED (STOCKING) ×1 IMPLANT
SPIKE FLUID TRANSFER (MISCELLANEOUS) IMPLANT
SPONGE T-LAP 4X18 ~~LOC~~+RFID (SPONGE) IMPLANT
STAPLER VISISTAT 35W (STAPLE) IMPLANT
STRIP CLOSURE SKIN 1/2X4 (GAUZE/BANDAGES/DRESSINGS) IMPLANT
SUT MON AB 4-0 PC3 18 (SUTURE) ×1 IMPLANT
SUT VIC AB 2-0 SH 27 (SUTURE) ×2
SUT VIC AB 2-0 SH 27XBRD (SUTURE) IMPLANT
SUT VICRYL 3-0 CR8 SH (SUTURE) ×1 IMPLANT
SUT VICRYL AB 3 0 TIES (SUTURE) IMPLANT
SYR CONTROL 10ML LL (SYRINGE) ×1 IMPLANT
TOWEL GREEN STERILE FF (TOWEL DISPOSABLE) ×2 IMPLANT
TUBE CONNECTING 20X1/4 (TUBING) IMPLANT
YANKAUER SUCT BULB TIP NO VENT (SUCTIONS) IMPLANT

## 2022-02-16 NOTE — Anesthesia Preprocedure Evaluation (Signed)
Anesthesia Evaluation  Patient identified by MRN, date of birth, ID band Patient awake    Reviewed: Allergy & Precautions, NPO status , Patient's Chart, lab work & pertinent test results  History of Anesthesia Complications (+) PONV and history of anesthetic complications  Airway Mallampati: II  TM Distance: >3 FB Neck ROM: Full    Dental no notable dental hx.    Pulmonary neg pulmonary ROS   Pulmonary exam normal        Cardiovascular negative cardio ROS  Rhythm:Regular Rate:Normal     Neuro/Psych   Anxiety Depression    negative neurological ROS     GI/Hepatic negative GI ROS, Neg liver ROS,,,  Endo/Other  negative endocrine ROS    Renal/GU negative Renal ROS  negative genitourinary   Musculoskeletal negative musculoskeletal ROS (+)  Back mass   Abdominal Normal abdominal exam  (+)   Peds  Hematology  (+) Blood dyscrasia, anemia   Anesthesia Other Findings   Reproductive/Obstetrics                             Anesthesia Physical Anesthesia Plan  ASA: 2  Anesthesia Plan: General   Post-op Pain Management:    Induction: Intravenous  PONV Risk Score and Plan: 4 or greater and Ondansetron, Dexamethasone, Midazolam and Treatment may vary due to age or medical condition  Airway Management Planned: Mask and Oral ETT  Additional Equipment: None  Intra-op Plan:   Post-operative Plan: Extubation in OR  Informed Consent: I have reviewed the patients History and Physical, chart, labs and discussed the procedure including the risks, benefits and alternatives for the proposed anesthesia with the patient or authorized representative who has indicated his/her understanding and acceptance.     Dental advisory given  Plan Discussed with: CRNA  Anesthesia Plan Comments:        Anesthesia Quick Evaluation

## 2022-02-16 NOTE — Anesthesia Postprocedure Evaluation (Signed)
Anesthesia Post Note  Patient: Patricia Houston  Procedure(s) Performed: EXCISION RIGHT BACK MASS/CYST (Right: Back)     Patient location during evaluation: PACU Anesthesia Type: General Level of consciousness: awake and alert Pain management: pain level controlled Vital Signs Assessment: post-procedure vital signs reviewed and stable Respiratory status: spontaneous breathing, nonlabored ventilation, respiratory function stable and patient connected to nasal cannula oxygen Cardiovascular status: blood pressure returned to baseline and stable Postop Assessment: no apparent nausea or vomiting Anesthetic complications: no   No notable events documented.  Last Vitals:  Vitals:   02/16/22 1117 02/16/22 1130  BP: 120/89 113/81  Pulse: 88 79  Resp: 17 16  Temp:  36.4 C  SpO2: 95% 97%    Last Pain:  Vitals:   02/16/22 1117  TempSrc:   PainSc: 0-No pain                 Belenda Cruise P Vern Prestia

## 2022-02-16 NOTE — H&P (Signed)
History of Present Illness: Patricia Houston is a 57 y.o. female who is seen today for postop follow-up after right breast lumpectomy with 8 weeks ago. She developed a painful cyst on her right upper back. Continued draining and was seen by her PCP and she was placed on antibiotics. Is been present there for least a week. It is open and draining now and she is here to discuss having it removed. She has no fever or chills.. She is finished radiation therapy and is now on Herceptin.   Review of Systems: A complete review of systems was obtained from the patient. I have reviewed this information and discussed as appropriate with the patient. See HPI as well for other ROS.    Medical History: Past Medical History:  Diagnosis Date  Anemia  Arthritis   There is no problem list on file for this patient.  Past Surgical History:  Procedure Laterality Date  MASTECTOMY PARTIAL / LUMPECTOMY 06/2018  HYSTERECTOMY VAGINAL    No Known Allergies  Current Outpatient Medications on File Prior to Visit  Medication Sig Dispense Refill  cholecalciferol (VITAMIN D3) 1000 unit tablet Take by mouth  cyanocobalamin, vitamin B-12, 3,000 mcg Subl Place under the tongue  ferrous sulfate 325 (65 FE) MG tablet 1 tablet   No current facility-administered medications on file prior to visit.   Family History  Problem Relation Age of Onset  Obesity Mother  High blood pressure (Hypertension) Mother  Hyperlipidemia (Elevated cholesterol) Mother  Diabetes Mother  Breast cancer Mother  High blood pressure (Hypertension) Father  Hyperlipidemia (Elevated cholesterol) Father  Coronary Artery Disease (Blocked arteries around heart) Father  Obesity Sister  High blood pressure (Hypertension) Sister  Hyperlipidemia (Elevated cholesterol) Sister  Diabetes Sister  Hyperlipidemia (Elevated cholesterol) Brother  High blood pressure (Hypertension) Brother    Social History   Tobacco Use  Smoking Status Never   Smokeless Tobacco Never    Social History   Socioeconomic History  Marital status: Married  Tobacco Use  Smoking status: Never  Smokeless tobacco: Never  Vaping Use  Vaping Use: Never used  Substance and Sexual Activity  Alcohol use: Yes  Drug use: Never   Objective:   Vitals:  01/11/22 1517 01/11/22 1520  Weight: 78.9 kg (174 lb)  Height: 167.6 cm (5' 6"$ ) 167.6 cm (5' 6"$ )  PainSc: 4   Body mass index is 28.08 kg/m.    Of her right upper back open wound. Appears to be a ruptured cyst. Under the skin it measures about 3 x 4 cm. No erythema or fluctuance today.  Assessment and Plan:   Diagnoses and all orders for this visit:  Infected epidermoid cyst    Recommend wide excision suggested an infective component is gone. Risks and benefits of surgery reviewed. She wishes to proceed with excision of right upper back cyst. Risk of bleeding, infection, recurrence, open wound, wound care and the need for the treatment center procedures reviewed with the patient today.  Status post breast conserving surgery on the right with silicone mapping now finishing immunotherapy. Radiation therapy is complete.  No follow-ups on file.  Kennieth Francois, MD

## 2022-02-16 NOTE — Anesthesia Procedure Notes (Signed)
Procedure Name: Intubation Date/Time: 02/16/2022 10:11 AM  Performed by: Verita Lamb, CRNAPre-anesthesia Checklist: Patient identified, Emergency Drugs available, Suction available and Patient being monitored Patient Re-evaluated:Patient Re-evaluated prior to induction Oxygen Delivery Method: Circle system utilized Preoxygenation: Pre-oxygenation with 100% oxygen Induction Type: IV induction Ventilation: Mask ventilation without difficulty Laryngoscope Size: Mac and 4 Grade View: Grade II Tube type: Oral Tube size: 7.5 mm Number of attempts: 1 Airway Equipment and Method: Stylet and Oral airway Placement Confirmation: ETT inserted through vocal cords under direct vision, positive ETCO2, breath sounds checked- equal and bilateral and CO2 detector Secured at: 22 cm Tube secured with: Tape Dental Injury: Teeth and Oropharynx as per pre-operative assessment

## 2022-02-16 NOTE — Op Note (Signed)
Preoperative diagnosis: Right upper back cyst measuring 4 cm x 3 cm  Postoperative diagnosis: Same  Procedure: Excision of right upper back cyst measuring 4 cm x 3 cm subcutaneous  Surgeon: Erroll Luna, MD  Anesthesia: LMA with 0.25% Marcaine plain  EBL: Minimal  Specimen: Right upper back cyst to pathology  Drains: None  Indications for procedure: The patient is a 57 year old female with a ruptured chronic cyst on her right upper back.  She presents today for excision.  Risks and benefits were reviewed.  The procedure was outlined with the patient as well as expected outcome and recovery.  Complications were reviewed as well.The procedure has been discussed with the patient.  Alternative therapies have been discussed with the patient.  Operative risks include bleeding,  Infection,  Organ injury,  Nerve injury,  Blood vessel injury,  DVT,  Pulmonary embolism,  Death,  And possible reoperation.  Medical management risks include worsening of present situation.  The success of the procedure is 50 -90 % at treating patients symptoms.  The patient understands and agrees to proceed.   Description of procedure: The Patient was met in the holding area.  The cyst was marked in the holding area to the right upper back.  She was then taken back to the operating room.  She was then placed initially supine on the operating table where general anesthesia initiated.  Of note she was placed on a beanbag.  She was then rolled up her left side down and padded appropriate.  The right upper back region was prepped and draped in sterile fashion timeout performed.  The cyst was identified and measured approximate 4 cm x 3 cm.  This area was excised with an ellipse of skin.  This was taken down into the subcutaneous fatty tissue.  This appeared to be a ruptured cyst.  Gross margins were negative.  Local anesthetic was infiltrated throughout.  I then ensured hemostasis with cautery and closed the wound with a deep  layer of 2-0 Vicryl.  3-0 Monocryl was used in a subcuticular stitch.  Dermabond applied.  All counts found to be correct.  The patient was then placed flat.  She was extubated.  She was then taken recovery in stable condition.  All counts were correct.

## 2022-02-16 NOTE — Transfer of Care (Signed)
Immediate Anesthesia Transfer of Care Note  Patient: Patricia Houston  Procedure(s) Performed: EXCISION RIGHT BACK MASS/CYST (Right: Back)  Patient Location: PACU  Anesthesia Type:General  Level of Consciousness: awake, drowsy, and patient cooperative  Airway & Oxygen Therapy: Patient Spontanous Breathing and Patient connected to face mask oxygen  Post-op Assessment: Report given to RN and Post -op Vital signs reviewed and stable  Post vital signs: Reviewed and stable  Last Vitals:  Vitals Value Taken Time  BP 118/82 02/16/22 1045  Temp    Pulse 89 02/16/22 1046  Resp 16 02/16/22 1046  SpO2 98 % 02/16/22 1046  Vitals shown include unvalidated device data.  Last Pain:  Vitals:   02/16/22 0907  TempSrc: Oral  PainSc: 0-No pain         Complications: No notable events documented.

## 2022-02-16 NOTE — Interval H&P Note (Signed)
History and Physical Interval Note:  02/16/2022 9:33 AM  Patricia Houston  has presented today for surgery, with the diagnosis of EXCISION RIGHT BACK MASS/CYST.  The various methods of treatment have been discussed with the patient and family. After consideration of risks, benefits and other options for treatment, the patient has consented to  Procedure(s) with comments: EXCISION RIGHT BACK MASS/CYST (Right) - GEN AND LOCAL as a surgical intervention.  The patient's history has been reviewed, patient examined, no change in status, stable for surgery.  I have reviewed the patient's chart and labs.  Questions were answered to the patient's satisfaction.     [place]

## 2022-02-16 NOTE — Discharge Instructions (Addendum)
No tylenol until 3:15 p.m.  #######################################################  GENERAL SURGERY: POST OP INSTRUCTIONS  ######################################################################  EAT Gradually transition to a high fiber diet with a fiber supplement over the next few weeks after discharge.  Start with a pureed / full liquid diet (see below)  WALK Walk an hour a day.  Control your pain to do that.    CONTROL PAIN Control pain so that you can walk, sleep, tolerate sneezing/coughing, go up/down stairs.  HAVE A BOWEL MOVEMENT DAILY Keep your bowels regular to avoid problems.  OK to try a laxative to override constipation.  OK to use an antidairrheal to slow down diarrhea.  Call if not better after 2 tries  CALL IF YOU HAVE PROBLEMS/CONCERNS Call if you are still struggling despite following these instructions. Call if you have concerns not answered by these instructions  ######################################################################    DIET: Follow a light bland diet & liquids the first 24 hours after arrival home, such as soup, liquids, starches, etc.  Be sure to drink plenty of fluids.  Quickly advance to a usual solid diet within a few days.  Avoid fast food or heavy meals as your are more likely to get nauseated or have irregular bowels.  A low-fat, high-fiber diet for the rest of your life is ideal.    Take your usually prescribed home medications unless otherwise directed.  PAIN CONTROL: Pain is best controlled by a usual combination of three different methods TOGETHER: Ice/Heat Over the counter pain medication Prescription pain medication Most patients will experience some swelling and bruising around the incisions.  Ice packs or heating pads (30-60 minutes up to 6 times a day) will help. Use ice for the first few days to help decrease swelling and bruising, then switch to heat to help relax tight/sore spots and speed recovery.  Some people prefer to  use ice alone, heat alone, alternating between ice & heat.  Experiment to what works for you.  Swelling and bruising can take several weeks to resolve.   It is helpful to take an over-the-counter pain medication regularly for the first few weeks.  Choose one of the following that works best for you: Naproxen (Aleve, etc)  Two 275m tabs twice a day Ibuprofen (Advil, etc) Three 203mtabs four times a day (every meal & bedtime) Acetaminophen (Tylenol, etc) 500-65086mour times a day (every meal & bedtime) A  prescription for pain medication (such as oxycodone, hydrocodone, etc) should be given to you upon discharge.  Take your pain medication as prescribed.  If you are having problems/concerns with the prescription medicine (does not control pain, nausea, vomiting, rash, itching, etc), please call us [phone] see if we need to switch you to a different pain medicine that will work better for you and/or control your side effect better. If you need a refill on your pain medication, please contact your pharmacy.  They will contact our office to request authorization. Prescriptions will not be filled after 5 pm or on week-ends.  Avoid getting constipated.  Between the surgery and the pain medications, it is common to experience some constipation.  Increasing fluid intake and taking a fiber supplement (such as Metamucil, Citrucel, FiberCon, MiraLax, etc) 1-2 times a day regularly will usually help prevent this problem from occurring.  A mild laxative (prune juice, Milk of Magnesia, MiraLax, etc) should be taken according to package directions if there are no bowel movements after 48 hours.   Watch out for diarrhea.  If you have  many loose bowel movements, simplify your diet to bland foods & liquids for a few days.  Stop any stool softeners and decrease your fiber supplement.  Switching to mild anti-diarrheal medications (Loperamide/Imodium, Kayopectate, Pepto Bismol) can help.  If this worsens or does  not improve, please call us.  Wash / shower every day.  You may shower over the dressings as they are waterproof.  Continue to shower over incision(s) after the dressing is off. Remove your waterproof bandages 5 days after surgery.  You may leave the incision open to air.  You may have skin tapes (Steri Strips) covering the incision(s).  Leave them on until one week, then remove.  You may replace a dressing/Band-Aid to cover the incision for comfort if you wish.   ACTIVITIES as tolerated:   You may resume regular (light) daily activities beginning the next day--such as daily self-care, walking, climbing stairs--gradually increasing activities as tolerated.  If you can walk 30 minutes without difficulty, it is safe to try more intense activity such as jogging, treadmill, bicycling, low-impact aerobics, swimming, etc. Save the most intensive and strenuous activity for last such as sit-ups, heavy lifting, contact sports, etc  Refrain from any heavy lifting or straining until you are off narcotics for pain control.   DO NOT PUSH THROUGH PAIN.  Let pain be your guide: If it hurts to do something, don't do it.  Pain is your body warning you to avoid that activity for another week until the pain goes down. You may drive when you are no longer taking prescription pain medication, you can comfortably wear a seatbelt, and you can safely maneuver your car and apply brakes. You may have sexual intercourse when it is comfortable.   FOLLOW UP in our office Please call [facility] at [phone] to set up an appointment to see your surgeon in the office for a follow-up appointment approximately 2-3 weeks after your surgery. Make sure that you call for this appointment the day you arrive home to insure a convenient appointment time.  9. IF YOU HAVE DISABILITY OR FAMILY LEAVE FORMS, BRING THEM TO THE OFFICE FOR PROCESSING.  DO NOT GIVE THEM TO YOUR DOCTOR.   WHEN TO CALL us [phone]: Poor pain control Reactions  / problems with new medications (rash/itching, nausea, etc)  Fever over 101.5 F (38.5 C) Worsening swelling or bruising Continued bleeding from incision. Increased pain, redness, or drainage from the incision Difficulty breathing / swallowing   The clinic staff is available to answer your questions during regular business hours (8:30am-5pm).  Please don't hesitate to call and ask to speak to one of our nurses for clinical concerns.   If you have a medical emergency, go to the nearest emergency room or call 911.  A surgeon from [facility] is always on call at the [facility], [place], [place], [place], [place]  [address] ? MAIN: [phone] ? TOLL FREE: [phone] ?  FAX [phone] [email or web address]  #######################################################   Post Anesthesia Home Care Instructions  Activity: Get plenty of rest for the remainder of the day. A responsible individual must stay with you for 24 hours following the procedure.  For the next 24 hours, DO NOT: -Drive a car -Paediatric nurse -Drink alcoholic beverages -Take any medication unless instructed by your physician -Make any legal decisions or sign important papers.  Meals: Start with liquid foods such as gelatin or soup. Progress to regular foods as tolerated.  Avoid greasy, spicy, heavy foods. If nausea and/or vomiting occur, drink only clear liquids until the nausea and/or vomiting subsides. Call your physician if vomiting continues.  Special Instructions/Symptoms: Your throat may feel dry or sore from the anesthesia or the breathing tube placed in your throat during surgery. If this causes discomfort, gargle with warm salt water. The discomfort should disappear within 24 hours.  If you had a scopolamine patch placed behind your ear for the management of post- operative nausea and/or vomiting:  1. The medication in the patch is  effective for 72 hours, after which it should be removed.  Wrap patch in a tissue and discard in the trash. Wash hands thoroughly with soap and water. 2. You may remove the patch earlier than 72 hours if you experience unpleasant side effects which may include dry mouth, dizziness or visual disturbances. 3. Avoid touching the patch. Wash your hands with soap and water after contact with the patch.

## 2022-02-17 ENCOUNTER — Encounter (HOSPITAL_BASED_OUTPATIENT_CLINIC_OR_DEPARTMENT_OTHER): Payer: Self-pay | Admitting: Surgery

## 2022-02-17 LAB — SURGICAL PATHOLOGY

## 2022-02-19 ENCOUNTER — Telehealth: Payer: Self-pay | Admitting: Hematology and Oncology

## 2022-02-19 NOTE — Telephone Encounter (Signed)
Rescheduled appointment per 2/16 secure chat, per charge nurse Erin. Left voicemail for patient.

## 2022-02-25 ENCOUNTER — Other Ambulatory Visit: Payer: Self-pay

## 2022-02-25 ENCOUNTER — Inpatient Hospital Stay: Payer: Federal, State, Local not specified - PPO

## 2022-02-25 ENCOUNTER — Inpatient Hospital Stay (HOSPITAL_BASED_OUTPATIENT_CLINIC_OR_DEPARTMENT_OTHER): Payer: Federal, State, Local not specified - PPO

## 2022-02-25 ENCOUNTER — Encounter: Payer: Self-pay | Admitting: *Deleted

## 2022-02-25 ENCOUNTER — Encounter: Payer: Self-pay | Admitting: Hematology and Oncology

## 2022-02-25 VITALS — BP 113/91 | HR 88 | Temp 98.1°F | Resp 16 | Wt 178.5 lb

## 2022-02-25 DIAGNOSIS — C50411 Malignant neoplasm of upper-outer quadrant of right female breast: Secondary | ICD-10-CM | POA: Diagnosis not present

## 2022-02-25 DIAGNOSIS — R197 Diarrhea, unspecified: Secondary | ICD-10-CM | POA: Diagnosis not present

## 2022-02-25 DIAGNOSIS — Z171 Estrogen receptor negative status [ER-]: Secondary | ICD-10-CM

## 2022-02-25 DIAGNOSIS — Z5112 Encounter for antineoplastic immunotherapy: Secondary | ICD-10-CM | POA: Diagnosis not present

## 2022-02-25 DIAGNOSIS — Z79899 Other long term (current) drug therapy: Secondary | ICD-10-CM | POA: Diagnosis not present

## 2022-02-25 DIAGNOSIS — D72819 Decreased white blood cell count, unspecified: Secondary | ICD-10-CM | POA: Diagnosis not present

## 2022-02-25 MED ORDER — ALTEPLASE 2 MG IJ SOLR
2.0000 mg | Freq: Once | INTRAMUSCULAR | Status: AC | PRN
  Administered 2022-02-25: 2 mg
  Filled 2022-02-25: qty 2

## 2022-02-25 MED ORDER — SODIUM CHLORIDE 0.9 % IV SOLN
420.0000 mg | Freq: Once | INTRAVENOUS | Status: AC
  Administered 2022-02-25: 420 mg via INTRAVENOUS
  Filled 2022-02-25: qty 14

## 2022-02-25 MED ORDER — SODIUM CHLORIDE 0.9 % IV SOLN: Freq: Once | INTRAVENOUS | Status: AC

## 2022-02-25 MED ORDER — HEPARIN SOD (PORK) LOCK FLUSH 100 UNIT/ML IV SOLN
500.0000 [IU] | Freq: Once | INTRAVENOUS | Status: AC | PRN
  Administered 2022-02-25: 500 [IU]

## 2022-02-25 MED ORDER — DIPHENHYDRAMINE HCL 25 MG PO CAPS
25.0000 mg | ORAL_CAPSULE | Freq: Once | ORAL | Status: AC
  Administered 2022-02-25: 25 mg via ORAL
  Filled 2022-02-25: qty 1

## 2022-02-25 MED ORDER — TRASTUZUMAB-DKST CHEMO 150 MG IV SOLR
6.0000 mg/kg | Freq: Once | INTRAVENOUS | Status: AC
  Administered 2022-02-25: 483 mg via INTRAVENOUS
  Filled 2022-02-25: qty 23

## 2022-02-25 MED ORDER — SODIUM CHLORIDE 0.9% FLUSH
10.0000 mL | INTRAVENOUS | Status: DC | PRN
  Administered 2022-02-25: 10 mL

## 2022-02-25 MED ORDER — ACETAMINOPHEN 325 MG PO TABS
650.0000 mg | ORAL_TABLET | Freq: Once | ORAL | Status: AC
  Administered 2022-02-25: 650 mg via ORAL
  Filled 2022-02-25: qty 2

## 2022-02-25 NOTE — Patient Instructions (Signed)
Patricia Houston  Discharge Instructions: Thank you for choosing [facility] to provide your oncology and hematology care.   If you have a lab appointment with the [facility], please go directly to the [facility] and check in at the registration area.   Wear comfortable clothing and clothing appropriate for easy access to any Portacath or PICC line.   We strive to give you quality time with your provider. You may need to reschedule your appointment if you arrive late (15 or more minutes).  Arriving late affects you and other patients whose appointments are after yours.  Also, if you miss three or more appointments without notifying the office, you may be dismissed from the clinic at the provider's discretion.      For prescription refill requests, have your pharmacy contact our office and allow 72 hours for refills to be completed.    Today you received the following chemotherapy and/or immunotherapy agents: trastuzumab-dkst and pertuzumab      To help prevent nausea and vomiting after your treatment, we encourage you to take your nausea medication as directed.  BELOW ARE SYMPTOMS THAT SHOULD BE REPORTED IMMEDIATELY: *FEVER GREATER THAN 100.4 F (38 C) OR HIGHER *CHILLS OR SWEATING *NAUSEA AND VOMITING THAT IS NOT CONTROLLED WITH YOUR NAUSEA MEDICATION *UNUSUAL SHORTNESS OF BREATH *UNUSUAL BRUISING OR BLEEDING *URINARY PROBLEMS (pain or burning when urinating, or frequent urination) *BOWEL PROBLEMS (unusual diarrhea, constipation, pain near the anus) TENDERNESS IN MOUTH AND THROAT WITH OR WITHOUT PRESENCE OF ULCERS (sore throat, sores in mouth, or a toothache) UNUSUAL RASH, SWELLING OR PAIN  UNUSUAL VAGINAL DISCHARGE OR ITCHING   Items with * indicate a potential emergency and should be followed up as soon as possible or go to the Emergency Department if any problems should occur.  Please show the CHEMOTHERAPY ALERT CARD or  IMMUNOTHERAPY ALERT CARD at check-in to the Emergency Department and triage nurse.  Should you have questions after your visit or need to cancel or reschedule your appointment, please contact [facility]  Dept: [phone]  and follow the prompts.  Office hours are 8:00 a.m. to 4:30 p.m. Monday - Friday. Please note that voicemails left after 4:00 p.m. may not be returned until the following business day.  We are closed weekends and major holidays. You have access to a nurse at all times for urgent questions. Please call the main number to the clinic Dept: [phone] and follow the prompts.   For any non-urgent questions, you may also contact your provider using MyChart. We now offer e-Visits for anyone 32 and older to request care online for non-urgent symptoms. For details visit [email or web address].   Also download the MyChart app! Go to the app store, search "MyChart", open the app, select [facility], and log in with your MyChart username and password.

## 2022-03-11 ENCOUNTER — Encounter: Payer: Self-pay | Admitting: Hematology and Oncology

## 2022-03-12 NOTE — Progress Notes (Signed)
Patient Care Team: Lois Huxley, PA as PCP - General (Family Medicine) Mauro Kaufmann, RN as Oncology Nurse Navigator Rockwell Germany, RN as Oncology Nurse Navigator Erroll Luna, MD as Consulting Physician (General Surgery) Nicholas Lose, MD as Consulting Physician (Hematology and Oncology) Eppie Gibson, MD as Attending Physician (Radiation Oncology)  DIAGNOSIS: No diagnosis found.  SUMMARY OF ONCOLOGIC HISTORY: Oncology History  Malignant neoplasm of upper-outer quadrant of right breast in female, estrogen receptor negative ([place])  06/14/2018 Surgery   06/14/2018: Right lumpectomy: Intraductal papilloma with usual ductal hyperplasia, 1 benign lymph node (decided against tamoxifen)   05/19/2021 Relapse/Recurrence   Screening mammogram detected right breast density.  2 lymph nodes were noted biopsy appointment was benign.  Ultrasound revealed 2.2 cm mass at 11 o'clock position right breast biopsy: Grade 3 IDC with necrosis, ER 0%, PR 0%, Ki-67 60%, HER2 3+ positive by [facility]   05/27/2021 Cancer Staging   Staging form: Breast, AJCC 8th Edition - Clinical: Stage IIA (cT2, cN0, cM0, G3, ER-, PR-, HER2+) - Signed by Nicholas Lose, MD on 05/27/2021 Stage prefix: Initial diagnosis Histologic grading system: 3 grade system   06/18/2021 - 08/22/2021 Chemotherapy   Patient is on Treatment Plan : BREAST  Docetaxel + Carboplatin + Trastuzumab + Pertuzumab  (TCHP) q21d      06/18/2021 -  Chemotherapy   Patient is on Treatment Plan : BREAST  Docetaxel + Carboplatin + Trastuzumab + Pertuzumab  (TCHP) q21d / Trastuzumab + Pertuzumab q21d       CHIEF COMPLIANT: Follow-up right breast cancer  INTERVAL HISTORY: Patricia Houston is a 57 y.o. female is here because of  diagnosis of right breast cancer. Currently on herceptin and prejeta. She presents to the clinic today for a follow-up.    ALLERGIES:  has No Known Allergies.  MEDICATIONS:  Current Outpatient Medications  Medication Sig  Dispense Refill   Cetirizine HCl (ZYRTEC PO) Take by mouth.     cholecalciferol (VITAMIN D3) 25 MCG (1000 UNIT) tablet Take 1 tablet (1,000 Units total) by mouth daily.     Cyanocobalamin (VITAMIN B-12) 3000 MCG SUBL Place 1 tablet under the tongue daily.     ferrous sulfate 325 (65 FE) MG tablet      ibuprofen (ADVIL) 200 MG tablet Take 200 mg by mouth as needed for moderate pain.     ibuprofen (ADVIL) 800 MG tablet Take 1 tablet (800 mg total) by mouth every 8 (eight) hours as needed. 30 tablet 0   oxyCODONE (OXY IR/ROXICODONE) 5 MG immediate release tablet Take 1 tablet (5 mg total) by mouth every 6 (six) hours as needed for severe pain. 15 tablet 0   No current facility-administered medications for this visit.    PHYSICAL EXAMINATION: ECOG PERFORMANCE STATUS: {CHL ONC ECOG [phone]}  There were no vitals filed for this visit. There were no vitals filed for this visit.  BREAST:*** No palpable masses or nodules in either right or left breasts. No palpable axillary supraclavicular or infraclavicular adenopathy no breast tenderness or nipple discharge. (exam performed in the presence of a chaperone)  LABORATORY DATA:  I have reviewed the data as listed    Latest Ref Rng & Units 02/04/2022   10:07 AM 11/19/2021   12:30 PM 10/23/2021    8:26 AM  CMP  Glucose 70 - 99 mg/dL 90  83  105   BUN 6 - 20 mg/dL '17  18  11   '$ Creatinine 0.44 - 1.00 mg/dL 1.04  1.13  1.00   Sodium 135 - 145 mmol/L 140  139  139   Potassium 3.5 - 5.1 mmol/L 3.8  4.0  4.0   Chloride 98 - 111 mmol/L 105  104  106   CO2 22 - 32 mmol/L '29  29  28   '$ Calcium 8.9 - 10.3 mg/dL 9.9  10.5  9.0   Total Protein 6.5 - 8.1 g/dL 7.1  7.5  6.2   Total Bilirubin 0.3 - 1.2 mg/dL 0.4  0.3  0.3   Alkaline Phos 38 - 126 U/L 107  93  85   AST 15 - 41 U/L '14  20  22   '$ ALT 0 - 44 U/L '13  29  30     '$ Lab Results  Component Value Date   WBC 2.7 (L) 02/04/2022   HGB 10.4 (L) 02/04/2022   HCT 31.0 (L) 02/04/2022   MCV 87.8  02/04/2022   PLT 162 02/04/2022   NEUTROABS 1.4 (L) 02/04/2022    ASSESSMENT & PLAN:  No problem-specific Assessment & Plan notes found for this encounter.    No orders of the defined types were placed in this encounter.  The patient has a good understanding of the overall plan. she agrees with it. she will call with any problems that may develop before the next visit here. Total time spent: 30 mins including face to face time and time spent for planning, charting and co-ordination of care   Suzzette Righter, [place] 03/12/22    I Gardiner Coins am acting as a Education administrator for [employer]  ***

## 2022-03-15 DIAGNOSIS — L720 Epidermal cyst: Secondary | ICD-10-CM | POA: Diagnosis not present

## 2022-03-18 ENCOUNTER — Inpatient Hospital Stay (HOSPITAL_BASED_OUTPATIENT_CLINIC_OR_DEPARTMENT_OTHER): Payer: Federal, State, Local not specified - PPO | Admitting: Hematology and Oncology

## 2022-03-18 ENCOUNTER — Inpatient Hospital Stay: Payer: Federal, State, Local not specified - PPO | Attending: Hematology and Oncology

## 2022-03-18 ENCOUNTER — Inpatient Hospital Stay: Payer: Federal, State, Local not specified - PPO

## 2022-03-18 VITALS — BP 101/68 | HR 86 | Resp 18

## 2022-03-18 VITALS — BP 126/86 | HR 95 | Temp 97.5°F | Resp 18 | Ht 66.0 in | Wt 177.2 lb

## 2022-03-18 DIAGNOSIS — D72819 Decreased white blood cell count, unspecified: Secondary | ICD-10-CM | POA: Diagnosis not present

## 2022-03-18 DIAGNOSIS — C50411 Malignant neoplasm of upper-outer quadrant of right female breast: Secondary | ICD-10-CM

## 2022-03-18 DIAGNOSIS — Z171 Estrogen receptor negative status [ER-]: Secondary | ICD-10-CM

## 2022-03-18 DIAGNOSIS — I427 Cardiomyopathy due to drug and external agent: Secondary | ICD-10-CM | POA: Diagnosis not present

## 2022-03-18 DIAGNOSIS — Z5112 Encounter for antineoplastic immunotherapy: Secondary | ICD-10-CM | POA: Insufficient documentation

## 2022-03-18 DIAGNOSIS — Z79899 Other long term (current) drug therapy: Secondary | ICD-10-CM | POA: Insufficient documentation

## 2022-03-18 DIAGNOSIS — Z95828 Presence of other vascular implants and grafts: Secondary | ICD-10-CM

## 2022-03-18 LAB — CMP (CANCER CENTER ONLY)
ALT: 13 U/L (ref 0–44)
AST: 14 U/L — ABNORMAL LOW (ref 15–41)
Albumin: 4.1 g/dL (ref 3.5–5.0)
Alkaline Phosphatase: 130 U/L — ABNORMAL HIGH (ref 38–126)
Anion gap: 7 (ref 5–15)
BUN: 22 mg/dL — ABNORMAL HIGH (ref 6–20)
CO2: 28 mmol/L (ref 22–32)
Calcium: 10.1 mg/dL (ref 8.9–10.3)
Chloride: 105 mmol/L (ref 98–111)
Creatinine: 1.32 mg/dL — ABNORMAL HIGH (ref 0.44–1.00)
GFR, Estimated: 48 mL/min — ABNORMAL LOW (ref >60)
Glucose, Bld: 104 mg/dL — ABNORMAL HIGH (ref 70–99)
Potassium: 4.2 mmol/L (ref 3.5–5.1)
Sodium: 140 mmol/L (ref 135–145)
Total Bilirubin: 0.3 mg/dL (ref 0.3–1.2)
Total Protein: 7.5 g/dL (ref 6.5–8.1)

## 2022-03-18 LAB — CBC WITH DIFFERENTIAL (CANCER CENTER ONLY)
Abs Immature Granulocytes: 0.01 10*3/uL (ref 0.00–0.07)
Basophils Absolute: 0 10*3/uL (ref 0.0–0.1)
Basophils Relative: 1 %
Eosinophils Absolute: 0.3 10*3/uL (ref 0.0–0.5)
Eosinophils Relative: 9 %
HCT: 33.1 % — ABNORMAL LOW (ref 36.0–46.0)
Hemoglobin: 10.9 g/dL — ABNORMAL LOW (ref 12.0–15.0)
Immature Granulocytes: 0 %
Lymphocytes Relative: 32 %
Lymphs Abs: 1.2 10*3/uL (ref 0.7–4.0)
MCH: 29.1 pg (ref 26.0–34.0)
MCHC: 32.9 g/dL (ref 30.0–36.0)
MCV: 88.5 fL (ref 80.0–100.0)
Monocytes Absolute: 0.3 10*3/uL (ref 0.1–1.0)
Monocytes Relative: 7 %
Neutro Abs: 1.8 10*3/uL (ref 1.7–7.7)
Neutrophils Relative %: 51 %
Platelet Count: 179 10*3/uL (ref 150–400)
RBC: 3.74 MIL/uL — ABNORMAL LOW (ref 3.87–5.11)
RDW: 13.6 % (ref 11.5–15.5)
WBC Count: 3.6 10*3/uL — ABNORMAL LOW (ref 4.0–10.5)
nRBC: 0 % (ref 0.0–0.2)

## 2022-03-18 MED ORDER — SODIUM CHLORIDE 0.9 % IV SOLN: Freq: Once | INTRAVENOUS | Status: AC

## 2022-03-18 MED ORDER — SODIUM CHLORIDE 0.9% FLUSH
10.0000 mL | INTRAVENOUS | Status: DC | PRN
  Administered 2022-03-18: 10 mL

## 2022-03-18 MED ORDER — ACETAMINOPHEN 325 MG PO TABS
650.0000 mg | ORAL_TABLET | Freq: Once | ORAL | Status: AC
  Administered 2022-03-18: 650 mg via ORAL
  Filled 2022-03-18: qty 2

## 2022-03-18 MED ORDER — HEPARIN SOD (PORK) LOCK FLUSH 100 UNIT/ML IV SOLN
500.0000 [IU] | Freq: Once | INTRAVENOUS | Status: AC | PRN
  Administered 2022-03-18: 500 [IU]

## 2022-03-18 MED ORDER — DIPHENHYDRAMINE HCL 25 MG PO CAPS
25.0000 mg | ORAL_CAPSULE | Freq: Once | ORAL | Status: AC
  Administered 2022-03-18: 25 mg via ORAL
  Filled 2022-03-18: qty 1

## 2022-03-18 MED ORDER — TRASTUZUMAB-DKST CHEMO 150 MG IV SOLR
6.0000 mg/kg | Freq: Once | INTRAVENOUS | Status: AC
  Administered 2022-03-18: 483 mg via INTRAVENOUS
  Filled 2022-03-18: qty 23

## 2022-03-18 MED ORDER — SODIUM CHLORIDE 0.9% FLUSH
10.0000 mL | Freq: Once | INTRAVENOUS | Status: AC
  Administered 2022-03-18: 10 mL

## 2022-03-18 MED ORDER — SODIUM CHLORIDE 0.9 % IV SOLN
420.0000 mg | Freq: Once | INTRAVENOUS | Status: AC
  Administered 2022-03-18: 420 mg via INTRAVENOUS
  Filled 2022-03-18: qty 14

## 2022-03-18 NOTE — Patient Instructions (Signed)
[facility]  Discharge Instructions: Thank you for choosing [facility] to provide your oncology and hematology care.   If you have a lab appointment with the Cancer Center, please go directly to the Cancer Center and check in at the registration area.   Wear comfortable clothing and clothing appropriate for easy access to any Portacath or PICC line.   We strive to give you quality time with your provider. You may need to reschedule your appointment if you arrive late (15 or more minutes).  Arriving late affects you and other patients whose appointments are after yours.  Also, if you miss three or more appointments without notifying the office, you may be dismissed from the clinic at the provider's discretion.      For prescription refill requests, have your pharmacy contact our office and allow 72 hours for refills to be completed.    Today you received the following chemotherapy and/or immunotherapy agents herceptin, perjeta      To help prevent nausea and vomiting after your treatment, we encourage you to take your nausea medication as directed.  BELOW ARE SYMPTOMS THAT SHOULD BE REPORTED IMMEDIATELY: *FEVER GREATER THAN 100.4 F (38 C) OR HIGHER *CHILLS OR SWEATING *NAUSEA AND VOMITING THAT IS NOT CONTROLLED WITH YOUR NAUSEA MEDICATION *UNUSUAL SHORTNESS OF BREATH *UNUSUAL BRUISING OR BLEEDING *URINARY PROBLEMS (pain or burning when urinating, or frequent urination) *BOWEL PROBLEMS (unusual diarrhea, constipation, pain near the anus) TENDERNESS IN MOUTH AND THROAT WITH OR WITHOUT PRESENCE OF ULCERS (sore throat, sores in mouth, or a toothache) UNUSUAL RASH, SWELLING OR PAIN  UNUSUAL VAGINAL DISCHARGE OR ITCHING   Items with * indicate a potential emergency and should be followed up as soon as possible or go to the Emergency Department if any problems should occur.  Please show the CHEMOTHERAPY ALERT CARD or IMMUNOTHERAPY ALERT CARD  at check-in to the Emergency Department and triage nurse.  Should you have questions after your visit or need to cancel or reschedule your appointment, please contact [facility]  Dept: [phone]  and follow the prompts.  Office hours are 8:00 a.m. to 4:30 p.m. Monday - Friday. Please note that voicemails left after 4:00 p.m. may not be returned until the following business day.  We are closed weekends and major holidays. You have access to a nurse at all times for urgent questions. Please call the main number to the clinic Dept: [phone] and follow the prompts.   For any non-urgent questions, you may also contact your provider using MyChart. We now offer e-Visits for anyone 18 and older to request care online for non-urgent symptoms. For details visit [email or web address].   Also download the MyChart app! Go to the app store, search "MyChart", open the app, select [facility], and log in with your MyChart username and password.   

## 2022-03-18 NOTE — Progress Notes (Signed)
Pt refused to remain in infusion suite for 30-min post observation time. Pt states understanding of risks. VSS and pt discharged in stable condition.

## 2022-03-18 NOTE — Assessment & Plan Note (Addendum)
06/14/2018: Right lumpectomy: Intraductal papilloma with usual ductal hyperplasia, 1 benign lymph node (decided against tamoxifen) 05/19/2021: Screening mammogram detected right breast density.  2 lymph nodes were noted biopsy appointment was benign.  Ultrasound revealed 2.2 cm mass at 11 o'clock position right breast biopsy: Grade 3 IDC with necrosis, ER 0%, PR 0%, Ki-67 60%, HER2 3+ positive by IHC   Treatment Plan: 1. Neoadjuvant chemotherapy with TCH Perjeta 6 cycles followed by Herceptin Perjeta maintenance versus Kadcyla maintenance (based on response to neoadjuvant chemo) for 1 year 2. 11/03/2021: Right lumpectomy: Pathologic complete response, 0/4 lymph nodes, margins negative 3. Followed by adjuvant radiation therapy 12/08/2021-12/25/2021 ------------------------------------------------------------------------------------------------------------------------------- Treatment plan: Continue with Herceptin Perjeta maintenance until May 16th 2024 Herceptin Perjeta toxicities: None 02/17/2012 right back soft tissue excision: Inflammation with focal giant cell reaction consistent with ruptured epidermal cyst  Leukopenia: Monitoring and monitoring Return to clinic every 3 weeks for Herceptin Perjeta maintenance every 6 weeks for follow-up with me.

## 2022-03-23 ENCOUNTER — Encounter: Payer: Self-pay | Admitting: Rehabilitation

## 2022-03-23 ENCOUNTER — Other Ambulatory Visit: Payer: Self-pay | Admitting: *Deleted

## 2022-03-23 ENCOUNTER — Ambulatory Visit: Payer: Federal, State, Local not specified - PPO | Attending: Hematology and Oncology | Admitting: Rehabilitation

## 2022-03-23 DIAGNOSIS — M25611 Stiffness of right shoulder, not elsewhere classified: Secondary | ICD-10-CM

## 2022-03-23 DIAGNOSIS — C50411 Malignant neoplasm of upper-outer quadrant of right female breast: Secondary | ICD-10-CM | POA: Insufficient documentation

## 2022-03-23 DIAGNOSIS — R293 Abnormal posture: Secondary | ICD-10-CM

## 2022-03-23 DIAGNOSIS — Z171 Estrogen receptor negative status [ER-]: Secondary | ICD-10-CM

## 2022-03-23 DIAGNOSIS — M25511 Pain in right shoulder: Secondary | ICD-10-CM

## 2022-03-23 NOTE — Therapy (Signed)
OUTPATIENT PHYSICAL THERAPY SOZO SCREENING NOTE   Patient Name: Patricia Houston MRN: [id number] DOB:[birth year], 57 y.o., female Today's Date: 03/23/2022  PCP: Lois Huxley, PA REFERRING PROVIDER: Nicholas Lose, MD   PT End of Session - 03/23/22 0902     Visit Number 9   screen only   PT Start Time 0903    Activity Tolerance Patient tolerated treatment well    Behavior During Therapy [facility] for tasks assessed/performed             Past Medical History:  Diagnosis Date   Anemia    TAKES IRON PILLS   Anxiety    Cancer ([place])    right breast DCIS   Depression    PONV (postoperative nausea and vomiting)    Past Surgical History:  Procedure Laterality Date   ABDOMINAL HYSTERECTOMY     BREAST LUMPECTOMY WITH RADIOACTIVE SEED AND SENTINEL LYMPH NODE BIOPSY Right 11/03/2021   Procedure: RIGHT BREAST BRACKETED LUMPECTOMY WITH RADIOACTIVE SEED AND SENTINEL LYMPH NODE BIOPSY;  Surgeon: Erroll Luna, MD;  Location: [facility];  Service: General;  Laterality: Right;   BREAST LUMPECTOMY WITH RADIOACTIVE SEED LOCALIZATION Right 06/14/2018   Procedure: RIGHT BREAST RADIOACTIVE SEED LOCALIZATION LUMPECTOMY X2;  Surgeon: Erroll Luna, MD;  Location: [facility];  Service: General;  Laterality: Right;   BREAST LUMPECTOMY WITH RADIOACTIVE SEED LOCALIZATION Left 11/03/2021   Procedure: LEFT BREAST BRACKETED LUMPECTOMY WITH RADIOACTIVE SEED LOCALIZATION;  Surgeon: Erroll Luna, MD;  Location: [facility];  Service: General;  Laterality: Left;   IR IMAGING GUIDED PORT INSERTION  06/17/2021   LAPAROSCOPIC ASSISTED VAGINAL HYSTERECTOMY N/A 02/27/2014   Procedure: LAPAROSCOPIC ASSISTED VAGINAL HYSTERECTOMY;  Surgeon: Maeola Sarah. Landry Mellow, MD;  Location: [facility];  Service: Gynecology;  Laterality: N/A;  abdomen to vagina to abdomen   MASS EXCISION Right 02/16/2022   Procedure: EXCISION RIGHT BACK MASS/CYST;  Surgeon: Erroll Luna, MD;  Location: [facility];  Service: General;  Laterality: Right;  GEN AND LOCAL   TONSILLECTOMY     Patient Active Problem List   Diagnosis Date Noted   Vitamin D deficiency 07/09/2021   Iron deficiency anemia 07/09/2021   Hyperlipidemia 07/09/2021   Port-A-Cath in place 06/18/2021   Malignant neoplasm of upper-outer quadrant of right breast in female, estrogen receptor negative ([place]) 05/27/2021   Atypical ductal hyperplasia of right breast 07/18/2018   Genetic testing 03/31/2018   Family history of breast cancer in mother 04/03/2015   Family history of pancreatic cancer 04/03/2015   S/P hysterectomy 02/27/2014   ANEMIA 07/01/2009    REFERRING DIAG: right breast cancer at risk for lymphedema  THERAPY DIAG:  Malignant neoplasm of upper-outer quadrant of right breast in female, estrogen receptor negative ([facility])  Stiffness of right shoulder, not elsewhere classified  Abnormal posture  Acute pain of right shoulder  PERTINENT HISTORY: Breast cancer recurrence noted 05/19/21. Hx of Rt lumpectomy x 2 in 2010 with Dr. Brantley Stage.  0/1 lymph node removed. Plan is for neoadjuvant chemotherapy TCHP followed by lumpectomy and SLNB and radiation.   PRECAUTIONS: right UE Lymphedema risk  SUBJECTIVE: I am having some breast swelling   PAIN:  Are you having pain? Yes a bit in the breast   SOZO SCREENING: Patient was assessed today using the SOZO machine to determine the lymphedema index score. This was compared to her baseline score. It was determined that she is within the recommended range when compared to her baseline and no further  action is needed at this time. She will continue SOZO screenings. These are done every 3 months for 2 years post operatively followed by every 6 months for 2 years, and then annually.  Sent request for breast referral     Stark Bray, PT 03/23/2022, 9:11 AM

## 2022-04-01 ENCOUNTER — Inpatient Hospital Stay: Payer: Federal, State, Local not specified - PPO

## 2022-04-01 ENCOUNTER — Inpatient Hospital Stay (HOSPITAL_BASED_OUTPATIENT_CLINIC_OR_DEPARTMENT_OTHER): Payer: Federal, State, Local not specified - PPO | Admitting: Genetic Counselor

## 2022-04-01 DIAGNOSIS — Z800 Family history of malignant neoplasm of digestive organs: Secondary | ICD-10-CM

## 2022-04-01 DIAGNOSIS — Z171 Estrogen receptor negative status [ER-]: Secondary | ICD-10-CM

## 2022-04-01 DIAGNOSIS — Z1379 Encounter for other screening for genetic and chromosomal anomalies: Secondary | ICD-10-CM

## 2022-04-01 DIAGNOSIS — C50411 Malignant neoplasm of upper-outer quadrant of right female breast: Secondary | ICD-10-CM

## 2022-04-01 DIAGNOSIS — Z803 Family history of malignant neoplasm of breast: Secondary | ICD-10-CM

## 2022-04-06 ENCOUNTER — Other Ambulatory Visit: Payer: Self-pay

## 2022-04-06 ENCOUNTER — Telehealth: Payer: Self-pay | Admitting: Hematology and Oncology

## 2022-04-06 NOTE — Telephone Encounter (Signed)
Scheduled appointment per WQ. Left voicemail. 

## 2022-04-06 NOTE — Therapy (Signed)
OUTPATIENT PHYSICAL THERAPY ONCOLOGY EVALUATION  Patient Name: Patricia Houston MRN: [id number] DOB:1965/10/04, 57 y.o., female Today's Date: 04/07/2022  END OF SESSION:  PT End of Session - 04/07/22 1608     Visit Number 1    Number of Visits 8    Date for PT Re-Evaluation 05/05/22    PT Start Time [id number]    PT Stop Time 1700    PT Time Calculation (min) 48 min    Activity Tolerance Patient tolerated treatment well    Behavior During Therapy WFL for tasks assessed/performed             Past Medical History:  Diagnosis Date   Anemia    TAKES IRON PILLS   Anxiety    Cancer    right breast DCIS   Depression    PONV (postoperative nausea and vomiting)    Past Surgical History:  Procedure Laterality Date   ABDOMINAL HYSTERECTOMY     BREAST LUMPECTOMY WITH RADIOACTIVE SEED AND SENTINEL LYMPH NODE BIOPSY Right 11/03/2021   Procedure: RIGHT BREAST BRACKETED LUMPECTOMY WITH RADIOACTIVE SEED AND SENTINEL LYMPH NODE BIOPSY;  Surgeon: Erroll Luna, MD;  Location: [facility];  Service: General;  Laterality: Right;   BREAST LUMPECTOMY WITH RADIOACTIVE SEED LOCALIZATION Right 06/14/2018   Procedure: RIGHT BREAST RADIOACTIVE SEED LOCALIZATION LUMPECTOMY X2;  Surgeon: Erroll Luna, MD;  Location: [facility];  Service: General;  Laterality: Right;   BREAST LUMPECTOMY WITH RADIOACTIVE SEED LOCALIZATION Left 11/03/2021   Procedure: LEFT BREAST BRACKETED LUMPECTOMY WITH RADIOACTIVE SEED LOCALIZATION;  Surgeon: Erroll Luna, MD;  Location: [facility];  Service: General;  Laterality: Left;   IR IMAGING GUIDED PORT INSERTION  06/17/2021   LAPAROSCOPIC ASSISTED VAGINAL HYSTERECTOMY N/A 02/27/2014   Procedure: LAPAROSCOPIC ASSISTED VAGINAL HYSTERECTOMY;  Surgeon: Maeola Sarah. Landry Mellow, MD;  Location: [facility];  Service: Gynecology;  Laterality: N/A;  abdomen to vagina to abdomen   MASS EXCISION Right 02/16/2022   Procedure: EXCISION RIGHT BACK MASS/CYST;   Surgeon: Erroll Luna, MD;  Location: [facility];  Service: General;  Laterality: Right;  GEN AND LOCAL   TONSILLECTOMY     Patient Active Problem List   Diagnosis Date Noted   Vitamin D deficiency 07/09/2021   Iron deficiency anemia 07/09/2021   Hyperlipidemia 07/09/2021   Port-A-Cath in place 06/18/2021   Malignant neoplasm of upper-outer quadrant of right breast in female, estrogen receptor negative 05/27/2021   Atypical ductal hyperplasia of right breast 07/18/2018   Genetic testing 03/31/2018   Family history of breast cancer in mother 04/03/2015   Family history of pancreatic cancer 04/03/2015   S/P hysterectomy 02/27/2014   ANEMIA 07/01/2009     REFERRING PROVIDER: Nicholas Lose, MD  REFERRING DIAG: s/p Right Breast Cancer  THERAPY DIAG:  Malignant neoplasm of upper-outer quadrant of right breast in female, estrogen receptor negative  Stiffness of right shoulder, not elsewhere classified  Abnormal posture  Acute pain of right shoulder  ONSET DATE: 11/04/2022  Rationale for Evaluation and Treatment: Rehabilitation  SUBJECTIVE:  SUBJECTIVE STATEMENT: Pt feels like her right breast has been swollen since surgery and she is sore and tender in the right breast and inferior to the right armpit. She feels restricted with reaching across and reaching behind her back. She has a compression bra but it bothers her at the armpit region. PERTINENT HISTORY:   Breast cancer recurrence noted 05/19/21. Hx of Rt lumpectomy  in 2020 with Dr. Brantley Stage.  0/1 lymph node removed. Treatment was neoadjuvant chemotherapy TCHP followed by right and left lumpectomies and right SLNB  with 0+/4 LN on 10/31//2023 and radiation which ended on 12/25/2021    PAIN:  Are you having pain? Yes NPRS  scale: 3/10 with occasional shooting pains up to 4/10 Pain location: right breast Pain orientation: Right  PAIN TYPE: sharp and tender Pain description: intermittent  Aggravating factors: laying on the right side,reaching Relieving factors: staying off that side  PRECAUTIONS: Right UE lymphedema risk  WEIGHT BEARING RESTRICTIONS: No  FALLS:  Has patient fallen in last 6 months? No  LIVING ENVIRONMENT: Lives with: lives with their spouse Lives in: House/apartment Stairs: Yes; Internal: 1 flight steps; on right going up and External: 3 steps; none Has following equipment at home: None  OCCUPATION: coordinator mostly computer  LEISURE: walking, riding peleton, reading  HAND DOMINANCE: left   PRIOR LEVEL OF FUNCTION: Independent  PATIENT GOALS: relieve tightness/soreness, be able to wash middle of back, lay on right side again.   OBJECTIVE:  COGNITION: Overall cognitive status: Within functional limits for tasks assessed   PALPATION: Tender right sternal and axillary border of pectorals  OBSERVATIONS / OTHER ASSESSMENTS: right breast mildly drawn up from radiation, skin thickening. Fibrosis noted medial to breast incision, mild generalized swelling.  SENSATION: Light touch: Deficits     POSTURE: forward head, rounded shoulders  UPPER EXTREMITY AROM/PROM:  A/PROM RIGHT   eval   Shoulder extension 47 tight  Shoulder flexion 140  Shoulder abduction 155  Shoulder internal rotation 32  Shoulder external rotation 99    (Blank rows = not tested)  A/PROM LEFT   eval  Shoulder extension 63  Shoulder flexion 162  Shoulder abduction 178  Shoulder internal rotation 67  Shoulder external rotation 90    (Blank rows = not tested)  CERVICAL AROM: All within functional limits:   RECENT SOZO in March WNL   UPPER EXTREMITY STRENGTH: WFL  LYMPHEDEMA ASSESSMENTS:   SURGERY TYPE/DATE: 11/03/2021 Right and left lumpectomies with Right SLNB, right Lumpectomy 2020  with SLNB  NUMBER OF LYMPH NODES REMOVED: total of 5  CHEMOTHERAPY: Yes  RADIATION:YES  HORMONE TREATMENT: NO  INFECTIONS: NO  LYMPHEDEMA ASSESSMENTS:   LANDMARK RIGHT  eval  10 cm proximal to olecranon process   Olecranon process   10 cm proximal to ulnar styloid process   Just proximal to ulnar styloid process   Across hand at thumb web space   At base of 2nd digit   (Blank rows = not tested)  LANDMARK LEFT  eval  10 cm proximal to olecranon process   Olecranon process   10 cm proximal to ulnar styloid process   Just proximal to ulnar styloid process   Across hand at thumb web space   At base of 2nd digit   (Blank rows = not tested)     QUICK DASH SURVEY: 16% BREAST COMPLAINTS QUESTIONNAIRE Pain 6 Heaviness:3 Swollen feeling:6 Tense Skin:2 Redness:2 Bra Print:0 Size of Pores:0 Hard feeling: 8 Total:  27   /80 A Score over  9 indicates lymphedema issues in the breast    TODAY'S TREATMENT:                                                                                                                                         DATE:  Educated pt in supine wand exs for flexion and scaption, supine star gazer and wall slides for abduction all x 4-5 reps.Pt demonstrated good improvement in ROM after exs. Discussed POC to address tight muscles, and breast swelling  PATIENT EDUCATION:  Education details: as above Person educated: Patient Education method: Consulting civil engineer, Media planner, and Handouts Education comprehension: verbalized understanding and returned demonstration  HOME EXERCISE PROGRAM: Supine wand flexion and scaption, supine star gazer, standing wall slides  ASSESSMENT:  CLINICAL IMPRESSION: Patient is a 57 y.o. female who was seen today for physical therapy evaluation and treatment for complaints of right breast swelling and limitations in right shoulder ROM. She is s/p right lumpectomy with SLNB on 11/03/2021 and had radiation which ended  12/25/2021. Right shoulder limited in all motions except ER. She has difficulty reaching behind her back and can't sleep on her right side. Her right breast is tender and pectorals are tight and tender as well. She will benefit from skilled PT to address deficits and return to PLOF.  OBJECTIVE IMPAIRMENTS: decreased knowledge of condition, decreased ROM, increased edema, and impaired UE functional use.   ACTIVITY LIMITATIONS: sleeping, dressing, and reach over head  PARTICIPATION LIMITATIONS:  pt is doing all that is required of her  PERSONAL FACTORS: 1-2 comorbidities: bilateral breast lumpectomies with chemo and  right radiation  are also affecting patient's functional outcome.   REHAB POTENTIAL: Excellent  CLINICAL DECISION MAKING: Stable/uncomplicated  EVALUATION COMPLEXITY: Low  GOALS: Goals reviewed with patient? Yes  SHORT TERM GOALS= LONG TERM GOALS: Target date: 05/05/2022  Pt will be independent in MLD to reduce Right breast swelling Baseline: Goal status: INITIAL  2.  Pt will be compliant with compression bra or sports bra to reduce right breast swelling Baseline:  Goal status: INITIAL  3.  Pt will have right shoulder ROM WNL to resume usual activities without limitation Baseline:  Goal status: INITIAL  4.  Pt will be able to sleep on right side without complaint Baseline:  Goal status: INITIAL  5.  Pt will be able to fasten bra behind back with improved ease Baseline:  Goal status: INITIAL  6.  Breast complaints survey will be no greater than 9 Baseline:  Goal status: INITIAL    PLAN:  PT FREQUENCY: 1-2x/week PT DURATION: 4 weeks  PLANNED INTERVENTIONS: Therapeutic exercises, Therapeutic activity, Neuromuscular re-education, Patient/Family education, Self Care, Joint mobilization, Orthotic/Fit training, Dry Needling, Manual lymph drainage, scar mobilization, Vasopneumatic device, Manual therapy, and Re-evaluation  PLAN FOR NEXT SESSION:  Review HEP prn.  Add supine LTR with arms outstretched/goal post, standing lat stretch, pectoral stretch,STM to right [place], [place], lateral  trunk, PROM prn, MLD to right breast and instruct pt.   Claris Pong, PT 04/07/2022, 8:13 PM

## 2022-04-07 ENCOUNTER — Ambulatory Visit: Payer: Federal, State, Local not specified - PPO | Attending: Hematology and Oncology

## 2022-04-07 ENCOUNTER — Other Ambulatory Visit: Payer: Self-pay

## 2022-04-07 DIAGNOSIS — C50411 Malignant neoplasm of upper-outer quadrant of right female breast: Secondary | ICD-10-CM | POA: Insufficient documentation

## 2022-04-07 DIAGNOSIS — Z171 Estrogen receptor negative status [ER-]: Secondary | ICD-10-CM | POA: Insufficient documentation

## 2022-04-07 DIAGNOSIS — R293 Abnormal posture: Secondary | ICD-10-CM | POA: Insufficient documentation

## 2022-04-07 DIAGNOSIS — M25511 Pain in right shoulder: Secondary | ICD-10-CM | POA: Insufficient documentation

## 2022-04-07 DIAGNOSIS — M25611 Stiffness of right shoulder, not elsewhere classified: Secondary | ICD-10-CM | POA: Insufficient documentation

## 2022-04-07 DIAGNOSIS — N63 Unspecified lump in unspecified breast: Secondary | ICD-10-CM | POA: Diagnosis not present

## 2022-04-07 NOTE — Patient Instructions (Signed)
SHOULDER: Flexion - Supine (Cane)        Cancer Rehab [phone]    Hold cane in both hands. Raise arms up overhead. Do not allow back to arch. Hold _5__ seconds. Do __5__ times; __2__ times a day. Hands shoulder width apart Hands slightly wider than shoulder width(Y) position  Shoulder Blade Stretch    Clasp fingers behind head with elbows touching in front of face. Pull elbows back while pressing shoulder blades together. Relax and hold as tolerated, can place pillow under elbow here for comfort as needed and to allow for prolonged stretch.  Repeat __5__ times. Do __1-2__ sessions per day.     Copyright  VHI. All rights reserved.

## 2022-04-08 ENCOUNTER — Encounter: Payer: Self-pay | Admitting: *Deleted

## 2022-04-08 ENCOUNTER — Inpatient Hospital Stay: Payer: Federal, State, Local not specified - PPO | Attending: Hematology and Oncology

## 2022-04-08 VITALS — BP 105/75 | HR 82 | Temp 98.9°F | Resp 16 | Ht 66.0 in | Wt 176.0 lb

## 2022-04-08 DIAGNOSIS — E785 Hyperlipidemia, unspecified: Secondary | ICD-10-CM | POA: Diagnosis not present

## 2022-04-08 DIAGNOSIS — Z832 Family history of diseases of the blood and blood-forming organs and certain disorders involving the immune mechanism: Secondary | ICD-10-CM | POA: Diagnosis not present

## 2022-04-08 DIAGNOSIS — Z803 Family history of malignant neoplasm of breast: Secondary | ICD-10-CM | POA: Insufficient documentation

## 2022-04-08 DIAGNOSIS — Z5112 Encounter for antineoplastic immunotherapy: Secondary | ICD-10-CM | POA: Diagnosis not present

## 2022-04-08 DIAGNOSIS — D72819 Decreased white blood cell count, unspecified: Secondary | ICD-10-CM | POA: Diagnosis not present

## 2022-04-08 DIAGNOSIS — Z809 Family history of malignant neoplasm, unspecified: Secondary | ICD-10-CM | POA: Insufficient documentation

## 2022-04-08 DIAGNOSIS — C50411 Malignant neoplasm of upper-outer quadrant of right female breast: Secondary | ICD-10-CM | POA: Insufficient documentation

## 2022-04-08 DIAGNOSIS — Z79899 Other long term (current) drug therapy: Secondary | ICD-10-CM | POA: Insufficient documentation

## 2022-04-08 DIAGNOSIS — M47814 Spondylosis without myelopathy or radiculopathy, thoracic region: Secondary | ICD-10-CM | POA: Insufficient documentation

## 2022-04-08 DIAGNOSIS — Z800 Family history of malignant neoplasm of digestive organs: Secondary | ICD-10-CM | POA: Diagnosis not present

## 2022-04-08 DIAGNOSIS — Z8249 Family history of ischemic heart disease and other diseases of the circulatory system: Secondary | ICD-10-CM | POA: Diagnosis not present

## 2022-04-08 DIAGNOSIS — Z90710 Acquired absence of both cervix and uterus: Secondary | ICD-10-CM | POA: Insufficient documentation

## 2022-04-08 DIAGNOSIS — Z171 Estrogen receptor negative status [ER-]: Secondary | ICD-10-CM | POA: Insufficient documentation

## 2022-04-08 DIAGNOSIS — R5383 Other fatigue: Secondary | ICD-10-CM | POA: Insufficient documentation

## 2022-04-08 MED ORDER — ACETAMINOPHEN 325 MG PO TABS
650.0000 mg | ORAL_TABLET | Freq: Once | ORAL | Status: AC
  Administered 2022-04-08: 650 mg via ORAL
  Filled 2022-04-08: qty 2

## 2022-04-08 MED ORDER — TRASTUZUMAB-DKST CHEMO 150 MG IV SOLR
6.0000 mg/kg | Freq: Once | INTRAVENOUS | Status: AC
  Administered 2022-04-08: 483 mg via INTRAVENOUS
  Filled 2022-04-08: qty 23

## 2022-04-08 MED ORDER — HEPARIN SOD (PORK) LOCK FLUSH 100 UNIT/ML IV SOLN
500.0000 [IU] | Freq: Once | INTRAVENOUS | Status: AC | PRN
  Administered 2022-04-08: 500 [IU]

## 2022-04-08 MED ORDER — SODIUM CHLORIDE 0.9 % IV SOLN
420.0000 mg | Freq: Once | INTRAVENOUS | Status: AC
  Administered 2022-04-08: 420 mg via INTRAVENOUS
  Filled 2022-04-08: qty 14

## 2022-04-08 MED ORDER — SODIUM CHLORIDE 0.9% FLUSH
10.0000 mL | INTRAVENOUS | Status: DC | PRN
  Administered 2022-04-08: 10 mL

## 2022-04-08 MED ORDER — SODIUM CHLORIDE 0.9 % IV SOLN: Freq: Once | INTRAVENOUS | Status: AC

## 2022-04-08 MED ORDER — DIPHENHYDRAMINE HCL 25 MG PO CAPS
25.0000 mg | ORAL_CAPSULE | Freq: Once | ORAL | Status: AC
  Administered 2022-04-08: 25 mg via ORAL
  Filled 2022-04-08: qty 1

## 2022-04-08 NOTE — Progress Notes (Signed)
Pt. declines to stay for 30 minute post observation. Vital signs stable, left via ambulation, and no respiratory distress noted.

## 2022-04-08 NOTE — Progress Notes (Signed)
Per MD okay to treat today with echo from 01/01/22.  Echo scheduled 06/18/22.

## 2022-04-08 NOTE — Patient Instructions (Addendum)
[facility]  Discharge Instructions: Thank you for choosing [facility] to provide your oncology and hematology care.   If you have a lab appointment with the [facility], please go directly to the [facility] and check in at the registration area.   Wear comfortable clothing and clothing appropriate for easy access to any Portacath or PICC line.   We strive to give you quality time with your provider. You may need to reschedule your appointment if you arrive late (15 or more minutes).  Arriving late affects you and other patients whose appointments are after yours.  Also, if you miss three or more appointments without notifying the office, you may be dismissed from the clinic at the provider's discretion.      For prescription refill requests, have your pharmacy contact our office and allow 72 hours for refills to be completed.    Today you received the following chemotherapy and/or immunotherapy agents Trastuzumab (Ogivri) and Pertuzumab (Perjeta).   To help prevent nausea and vomiting after your treatment, we encourage you to take your nausea medication as directed.  BELOW ARE SYMPTOMS THAT SHOULD BE REPORTED IMMEDIATELY: *FEVER GREATER THAN 100.4 F (38 C) OR HIGHER *CHILLS OR SWEATING *NAUSEA AND VOMITING THAT IS NOT CONTROLLED WITH YOUR NAUSEA MEDICATION *UNUSUAL SHORTNESS OF BREATH *UNUSUAL BRUISING OR BLEEDING *URINARY PROBLEMS (pain or burning when urinating, or frequent urination) *BOWEL PROBLEMS (unusual diarrhea, constipation, pain near the anus) TENDERNESS IN MOUTH AND THROAT WITH OR WITHOUT PRESENCE OF ULCERS (sore throat, sores in mouth, or a toothache) UNUSUAL RASH, SWELLING OR PAIN  UNUSUAL VAGINAL DISCHARGE OR ITCHING   Items with * indicate a potential emergency and should be followed up as soon as possible or go to the Emergency Department if any problems should occur.  Please show the CHEMOTHERAPY ALERT CARD or  IMMUNOTHERAPY ALERT CARD at check-in to the Emergency Department and triage nurse.  Should you have questions after your visit or need to cancel or reschedule your appointment, please contact [facility]  Dept: [phone]  and follow the prompts.  Office hours are 8:00 a.m. to 4:30 p.m. Monday - Friday. Please note that voicemails left after 4:00 p.m. may not be returned until the following business day.  We are closed weekends and major holidays. You have access to a nurse at all times for urgent questions. Please call the main number to the clinic Dept: [phone] and follow the prompts.   For any non-urgent questions, you may also contact your provider using MyChart. We now offer e-Visits for anyone 102 and older to request care online for non-urgent symptoms. For details visit [email or web address].   Also download the MyChart app! Go to the app store, search "MyChart", open the app, select [facility], and log in with your MyChart username and password.  Trastuzumab Injection What is this medication? TRASTUZUMAB (tras TOO zoo mab) treats breast cancer and stomach cancer. It works by blocking a protein that causes cancer cells to grow and multiply. This helps to slow or stop the spread of cancer cells. This medicine may be used for other purposes; ask your health care provider or pharmacist if you have questions. COMMON BRAND NAME(S): Herceptin, Janae Bridgeman, Ontruzant, Trazimera What should I tell my care team before I take this medication? They need to know if you have any of these conditions: Heart failure Lung disease An unusual or allergic reaction to trastuzumab, other  medications, foods, dyes, or preservatives Pregnant or trying to get pregnant Breast-feeding How should I use this medication? This medication is injected into a vein. It is given by your care team in a hospital or clinic setting. Talk to your care team about the  use of this medication in children. It is not approved for use in children. Overdosage: If you think you have taken too much of this medicine contact a poison control center or emergency room at once. NOTE: This medicine is only for you. Do not share this medicine with others. What if I miss a dose? Keep appointments for follow-up doses. It is important not to miss your dose. Call your care team if you are unable to keep an appointment. What may interact with this medication? Certain types of chemotherapy, such as daunorubicin, doxorubicin, epirubicin, idarubicin This list may not describe all possible interactions. Give your health care provider a list of all the medicines, herbs, non-prescription drugs, or dietary supplements you use. Also tell them if you smoke, drink alcohol, or use illegal drugs. Some items may interact with your medicine. What should I watch for while using this medication? Your condition will be monitored carefully while you are receiving this medication. This medication may make you feel generally unwell. This is not uncommon, as chemotherapy affects healthy cells as well as cancer cells. Report any side effects. Continue your course of treatment even though you feel ill unless your care team tells you to stop. This medication may increase your risk of getting an infection. Call your care team for advice if you get a fever, chills, sore throat, or other symptoms of a cold or flu. Do not treat yourself. Try to avoid being around people who are sick. Avoid taking medications that contain aspirin, acetaminophen, ibuprofen, naproxen, or ketoprofen unless instructed by your care team. These medications can hide a fever. Talk to your care team if you may be pregnant. Serious birth defects can occur if you take this medication during pregnancy and for 7 months after the last dose. You will need a negative pregnancy test before starting this medication. Contraception is recommended  while taking this medication and for 7 months after the last dose. Your care team can help you find the option that works for you. Do not breastfeed while taking this medication and for 7 months after stopping treatment. What side effects may I notice from receiving this medication? Side effects that you should report to your care team as soon as possible: Allergic reactions or angioedema--skin rash, itching or hives, swelling of the face, eyes, lips, tongue, arms, or legs, trouble swallowing or breathing Dry cough, shortness of breath or trouble breathing Heart failure--shortness of breath, swelling of the ankles, feet, or hands, sudden weight gain, unusual weakness or fatigue Infection--fever, chills, cough, or sore throat Infusion reactions--chest pain, shortness of breath or trouble breathing, feeling faint or lightheaded Side effects that usually do not require medical attention (report to your care team if they continue or are bothersome): Diarrhea Dizziness Headache Nausea Trouble sleeping Vomiting This list may not describe all possible side effects. Call your doctor for medical advice about side effects. You may report side effects to FDA at 1-800-FDA-1088. Where should I keep my medication? This medication is given in a hospital or clinic. It will not be stored at home. NOTE: This sheet is a summary. It may not cover all possible information. If you have questions about this medicine, talk to your doctor, pharmacist, or health  care provider.  2023 Elsevier/Gold Standard (2021-04-23 00:00:00)  Pertuzumab Injection What is this medication? PERTUZUMAB (per TOOZ ue mab) treats breast cancer. It works by blocking a protein that causes cancer cells to grow and multiply. This helps to slow or stop the spread of cancer cells. It is a monoclonal antibody. This medicine may be used for other purposes; ask your health care provider or pharmacist if you have questions. COMMON BRAND NAME(S):  PERJETA What should I tell my care team before I take this medication? They need to know if you have any of these conditions: Heart failure An unusual or allergic reaction to pertuzumab, other medications, foods, dyes, or preservatives Pregnant or trying to get pregnant Breast-feeding How should I use this medication? This medication is injected into a vein. It is given by your care team in a hospital or clinic setting. Talk to your care team about the use of this medication in children. Special care may be needed. Overdosage: If you think you have taken too much of this medicine contact a poison control center or emergency room at once. NOTE: This medicine is only for you. Do not share this medicine with others. What if I miss a dose? Keep appointments for follow-up doses. It is important not to miss your dose. Call your care team if you are unable to keep an appointment. What may interact with this medication? Interactions are not expected. This list may not describe all possible interactions. Give your health care provider a list of all the medicines, herbs, non-prescription drugs, or dietary supplements you use. Also tell them if you smoke, drink alcohol, or use illegal drugs. Some items may interact with your medicine. What should I watch for while using this medication? Your condition will be monitored carefully while you are receiving this medication. This medication may make you feel generally unwell. This is not uncommon as chemotherapy can affect healthy cells as well as cancer cells. Report any side effects. Continue your course of treatment even though you feel ill unless your care team tells you to stop. Talk to your care team if you may be pregnant. Serious birth defects can occur if you take this medication during pregnancy and for 7 months after the last dose. You will need a negative pregnancy test before starting this medication. Contraception is recommended while taking this  medication and for 7 months after the last dose. Your care team can help you find the option that works for you. Do not breastfeed while taking this medication and for 7 months after the last dose. What side effects may I notice from receiving this medication? Side effects that you should report to your care team as soon as possible: Allergic reactions or angioedema--skin rash, itching or hives, swelling of the face, eyes, lips, tongue, arms, or legs, trouble swallowing or breathing Heart failure--shortness of breath, swelling of the ankles, feet, or hands, sudden weight gain, unusual weakness or fatigue Infusion reactions--chest pain, shortness of breath or trouble breathing, feeling faint or lightheaded Side effects that usually do not require medical attention (report to your care team if they continue or are bothersome): Diarrhea Dry skin Fatigue Hair loss Nausea Vomiting This list may not describe all possible side effects. Call your doctor for medical advice about side effects. You may report side effects to FDA at 1-800-FDA-1088. Where should I keep my medication? This medication is given in a hospital or clinic. It will not be stored at home. NOTE: This sheet is a summary.  It may not cover all possible information. If you have questions about this medicine, talk to your doctor, pharmacist, or health care provider.  2023 Elsevier/Gold Standard (2010-06-16 00:00:00)

## 2022-04-12 ENCOUNTER — Telehealth: Payer: Self-pay | Admitting: Hematology and Oncology

## 2022-04-12 NOTE — Telephone Encounter (Signed)
Moved patient from Dr.Gudena's schedule to [facility] schedule on Thursday 4/25 due to the fact that Dr.Gudena will be on [facility]. Left voicemail for patient.

## 2022-04-23 ENCOUNTER — Ambulatory Visit (HOSPITAL_COMMUNITY)
Admission: RE | Admit: 2022-04-23 | Discharge: 2022-04-23 | Disposition: A | Discharge status: Home / Self Care | Payer: Federal, State, Local not specified - PPO | Source: Ambulatory Visit | Attending: Hematology and Oncology | Admitting: Hematology and Oncology

## 2022-04-23 ENCOUNTER — Telehealth: Payer: Self-pay | Admitting: *Deleted

## 2022-04-23 ENCOUNTER — Inpatient Hospital Stay (HOSPITAL_BASED_OUTPATIENT_CLINIC_OR_DEPARTMENT_OTHER): Payer: Federal, State, Local not specified - PPO | Admitting: Physician Assistant

## 2022-04-23 ENCOUNTER — Other Ambulatory Visit: Payer: Self-pay

## 2022-04-23 VITALS — BP 120/83 | HR 78 | Temp 98.3°F | Resp 16 | Wt 178.4 lb

## 2022-04-23 DIAGNOSIS — Z95828 Presence of other vascular implants and grafts: Secondary | ICD-10-CM

## 2022-04-23 DIAGNOSIS — Z800 Family history of malignant neoplasm of digestive organs: Secondary | ICD-10-CM | POA: Diagnosis not present

## 2022-04-23 DIAGNOSIS — M47814 Spondylosis without myelopathy or radiculopathy, thoracic region: Secondary | ICD-10-CM | POA: Diagnosis not present

## 2022-04-23 DIAGNOSIS — D72819 Decreased white blood cell count, unspecified: Secondary | ICD-10-CM | POA: Diagnosis not present

## 2022-04-23 DIAGNOSIS — Z832 Family history of diseases of the blood and blood-forming organs and certain disorders involving the immune mechanism: Secondary | ICD-10-CM | POA: Diagnosis not present

## 2022-04-23 DIAGNOSIS — C50411 Malignant neoplasm of upper-outer quadrant of right female breast: Secondary | ICD-10-CM | POA: Diagnosis not present

## 2022-04-23 DIAGNOSIS — Z90710 Acquired absence of both cervix and uterus: Secondary | ICD-10-CM | POA: Diagnosis not present

## 2022-04-23 DIAGNOSIS — Z803 Family history of malignant neoplasm of breast: Secondary | ICD-10-CM | POA: Diagnosis not present

## 2022-04-23 DIAGNOSIS — Z452 Encounter for adjustment and management of vascular access device: Secondary | ICD-10-CM | POA: Diagnosis not present

## 2022-04-23 DIAGNOSIS — Z79899 Other long term (current) drug therapy: Secondary | ICD-10-CM | POA: Diagnosis not present

## 2022-04-23 DIAGNOSIS — Z171 Estrogen receptor negative status [ER-]: Secondary | ICD-10-CM

## 2022-04-23 DIAGNOSIS — Z809 Family history of malignant neoplasm, unspecified: Secondary | ICD-10-CM | POA: Diagnosis not present

## 2022-04-23 DIAGNOSIS — E785 Hyperlipidemia, unspecified: Secondary | ICD-10-CM | POA: Diagnosis not present

## 2022-04-23 DIAGNOSIS — Z5112 Encounter for antineoplastic immunotherapy: Secondary | ICD-10-CM | POA: Diagnosis not present

## 2022-04-23 DIAGNOSIS — Z8249 Family history of ischemic heart disease and other diseases of the circulatory system: Secondary | ICD-10-CM | POA: Diagnosis not present

## 2022-04-23 DIAGNOSIS — R5383 Other fatigue: Secondary | ICD-10-CM | POA: Diagnosis not present

## 2022-04-23 NOTE — Progress Notes (Signed)
Opened in error

## 2022-04-23 NOTE — Telephone Encounter (Signed)
Received call from pt stating she feels as if her port a cath hub is moving up in her chest towards her neck.  Pt denies recent injury, trauma, redness, or swelling.  Per MD pt needing chest xray and [facility] visit for further evaluation.

## 2022-04-23 NOTE — Progress Notes (Signed)
Symptom Management Consult Note [facility]    Patient Care Team: Wilfrid Lund, PA as PCP - General (Family Medicine) Pershing Proud, RN as Oncology Nurse Navigator Donnelly Angelica, RN as Oncology Nurse Navigator Harriette Bouillon, MD as Consulting Physician (General Surgery) Serena Croissant, MD as Consulting Physician (Hematology and Oncology) Lonie Peak, MD as Attending Physician (Radiation Oncology)    Name / MRN / DOB: Patricia Houston  [id number]  12-Dec-1965   Date of visit: 04/23/2022   Chief Complaint/Reason for visit: port check   Current Therapy: pertizimab and trastuzumab-dkst  Last treatment:  Day 1   Cycle 15 on 04/08/22   ASSESSMENT & PLAN: Patient is a 57 y.o. female  with oncologic history of Malignant neoplasm of upper-outer quadrant of right breast, estrogen receptor negative  followed by Dr. Pamelia Hoit.  I have viewed most recent oncology note and lab work.    #Malignant neoplasm of upper-outer quadrant of right breast,estrogen receptor negative  - Next appointment with oncologist is 04/29/22   #Port-a cath - No tenderness around port site, no overlying skin changes.  - Chest xray obtained prior to visit shows port terminates in superior SVC. Port was originally placed by IR in 06/2021 and imaging shows it terminated in the right atrium. - I reached out to IR team and Dr Ruthy Dick reviewed the chest xray. He okayed the port for use based on the chest film. No indications for revision at this time.  - Patient updated on results and is agreeable with plan.   Heme/Onc History: Oncology History  Malignant neoplasm of upper-outer quadrant of right breast in female, estrogen receptor negative  06/14/2018 Surgery   06/14/2018: Right lumpectomy: Intraductal papilloma with usual ductal hyperplasia, 1 benign lymph node (decided against tamoxifen)   05/19/2021 Relapse/Recurrence   Screening mammogram detected right breast density.  2 lymph  nodes were noted biopsy appointment was benign.  Ultrasound revealed 2.2 cm mass at 11 o'clock position right breast biopsy: Grade 3 IDC with necrosis, ER 0%, PR 0%, Ki-67 60%, HER2 3+ positive by [facility]   05/27/2021 Cancer Staging   Staging form: Breast, AJCC 8th Edition - Clinical: Stage IIA (cT2, cN0, cM0, G3, ER-, PR-, HER2+) - Signed by Serena Croissant, MD on 05/27/2021 Stage prefix: Initial diagnosis Histologic grading system: 3 grade system   06/18/2021 - 08/22/2021 Chemotherapy   Patient is on Treatment Plan : BREAST  Docetaxel + Carboplatin + Trastuzumab + Pertuzumab  (TCHP) q21d      06/18/2021 -  Chemotherapy   Patient is on Treatment Plan : BREAST  Docetaxel + Carboplatin + Trastuzumab + Pertuzumab  (TCHP) q21d / Trastuzumab + Pertuzumab q21d         Interval history-: Patricia Houston is a 57 y.o. female with oncologic history as above presenting to [facility] today with chief complaint of port-a-cath check.  She presents unaccompanied to clinic today.  Patient states she thinks her port has moved.  About a week ago while she was laying in bed she had a hard time getting comfortable which was new for her.  She was lying on her stomach and had to reposition herself.  She did not think much of it until she experienced it again earlier this week.  She denies any fall or injury to her chest.  She has not had any fevers.  No over-the-counter medications taken prior to arrival. Her last treatment was 04/08/22 and she denies any pain when the  port was accessed. She does not remember the RN having any difficulty getting blood return. She denies any neck pain, arm swelling, shortness of breath, or chest pain.     ROS  All other systems are reviewed and are negative for acute change except as noted in the HPI.    No Known Allergies   Past Medical History:  Diagnosis Date   Anemia    TAKES IRON PILLS   Anxiety    Cancer    right breast DCIS   Depression    PONV (postoperative nausea and  vomiting)      Past Surgical History:  Procedure Laterality Date   ABDOMINAL HYSTERECTOMY     BREAST LUMPECTOMY WITH RADIOACTIVE SEED AND SENTINEL LYMPH NODE BIOPSY Right 11/03/2021   Procedure: RIGHT BREAST BRACKETED LUMPECTOMY WITH RADIOACTIVE SEED AND SENTINEL LYMPH NODE BIOPSY;  Surgeon: Harriette Bouillon, MD;  Location: [facility];  Service: General;  Laterality: Right;   BREAST LUMPECTOMY WITH RADIOACTIVE SEED LOCALIZATION Right 06/14/2018   Procedure: RIGHT BREAST RADIOACTIVE SEED LOCALIZATION LUMPECTOMY X2;  Surgeon: Harriette Bouillon, MD;  Location: [facility];  Service: General;  Laterality: Right;   BREAST LUMPECTOMY WITH RADIOACTIVE SEED LOCALIZATION Left 11/03/2021   Procedure: LEFT BREAST BRACKETED LUMPECTOMY WITH RADIOACTIVE SEED LOCALIZATION;  Surgeon: Harriette Bouillon, MD;  Location: [facility];  Service: General;  Laterality: Left;   IR IMAGING GUIDED PORT INSERTION  06/17/2021   LAPAROSCOPIC ASSISTED VAGINAL HYSTERECTOMY N/A 02/27/2014   Procedure: LAPAROSCOPIC ASSISTED VAGINAL HYSTERECTOMY;  Surgeon: Dorien Chihuahua. Richardson Dopp, MD;  Location: [facility];  Service: Gynecology;  Laterality: N/A;  abdomen to vagina to abdomen   MASS EXCISION Right 02/16/2022   Procedure: EXCISION RIGHT BACK MASS/CYST;  Surgeon: Harriette Bouillon, MD;  Location: [facility];  Service: General;  Laterality: Right;  GEN AND LOCAL   TONSILLECTOMY      Social History   Socioeconomic History   Marital status: Married    Spouse name: Not on file   Number of children: Not on file   Years of education: Not on file   Highest education level: Not on file  Occupational History   Not on file  Tobacco Use   Smoking status: Never   Smokeless tobacco: Never  Vaping Use   Vaping Use: Never used  Substance and Sexual Activity   Alcohol use: Yes    Comment: rare - 1-2 drinks every few months   Drug use: No   Sexual activity: Yes    Birth control/protection:  Surgical  Other Topics Concern   Not on file  Social History Narrative   Not on file   Social Determinants of Health   Financial Resource Strain: Not on file  Food Insecurity: Not on file  Transportation Needs: No Transportation Needs (06/20/2018)   PRAPARE - Administrator, Civil Service (Medical): No    Lack of Transportation (Non-Medical): No  Physical Activity: Not on file  Stress: Not on file  Social Connections: Not on file  Intimate Partner Violence: Not At Risk (06/20/2018)   Humiliation, Afraid, Rape, and Kick questionnaire    Fear of Current or Ex-Partner: No    Emotionally Abused: No    Physically Abused: No    Sexually Abused: No    Family History  Problem Relation Age of Onset   Breast cancer Mother        dx. late 70s; s/p mastectomy   Pancreatic cancer Mother 28  adenocarcinoma   Other Sister        one sister had a hysterectomy due to heavy bleeding at the age of 41   Heart attack Maternal Uncle 41   Heart attack Maternal Grandmother 55   Prostate cancer Maternal Grandfather 76       "very aggressive"   Leukemia Other 14       maternal great aunt (MGM's sister)   Stomach cancer Other        maternal great aunt (MGF's sister) dx. over age 67   Heart Problems Paternal Grandmother    Cancer Other        maternal great uncle (MGM's brother) dx. NOS cancer at later age   Breast cancer Cousin        (2) maternal "2nd cousins" dx. with breast cancers in their mid-50s   Colon cancer Neg Hx      Current Outpatient Medications:    Cetirizine HCl (ZYRTEC PO), Take by mouth., Disp: , Rfl:    cholecalciferol (VITAMIN D3) 25 MCG (1000 UNIT) tablet, Take 1 tablet (1,000 Units total) by mouth daily., Disp: , Rfl:    Cyanocobalamin (VITAMIN B-12) 3000 MCG SUBL, Place 1 tablet under the tongue daily., Disp: , Rfl:    ferrous sulfate 325 (65 FE) MG tablet, , Disp: , Rfl:    ibuprofen (ADVIL) 200 MG tablet, Take 200 mg by mouth as needed for moderate  pain., Disp: , Rfl:    ibuprofen (ADVIL) 800 MG tablet, Take 1 tablet (800 mg total) by mouth every 8 (eight) hours as needed., Disp: 30 tablet, Rfl: 0  PHYSICAL EXAM: ECOG FS:1 - Symptomatic but completely ambulatory    Vitals:   04/23/22 1322  BP: 120/83  Pulse: 78  Resp: 16  Temp: 98.3 F (36.8 C)  TempSrc: Oral  SpO2: 100%  Weight: 178 lb 6.4 oz (80.9 kg)   Physical Exam Vitals and nursing note reviewed.  Constitutional:      Appearance: She is not ill-appearing or toxic-appearing.  HENT:     Head: Normocephalic.  Eyes:     Conjunctiva/sclera: Conjunctivae normal.  Cardiovascular:     Rate and Rhythm: Normal rate and regular rhythm.     Pulses: Normal pulses.     Heart sounds: Normal heart sounds.  Pulmonary:     Effort: Pulmonary effort is normal.     Breath sounds: Normal breath sounds.  Chest:     Comments: PAC in left upper chest. No tenderness to palpation. No overlying erythema. Incision is well healed. No swelling. Abdominal:     General: There is no distension.  Musculoskeletal:     Cervical back: Normal range of motion.  Skin:    General: Skin is warm and dry.  Neurological:     Mental Status: She is alert.        LABORATORY DATA: I have reviewed the data as listed    Latest Ref Rng & Units 03/18/2022    8:35 AM 02/04/2022   10:07 AM 11/19/2021   12:30 PM  CBC  WBC 4.0 - 10.5 K/uL 3.6  2.7  4.6   Hemoglobin 12.0 - 15.0 g/dL 16.1  09.6  9.7   Hematocrit 36.0 - 46.0 % 33.1  31.0  29.4   Platelets 150 - 400 K/uL 179  162  189         Latest Ref Rng & Units 03/18/2022    8:35 AM 02/04/2022   10:07 AM 11/19/2021   12:30 PM  CMP  Glucose 70 - 99 mg/dL 811  90  83   BUN 6 - 20 mg/dL Creatinine 0.44 - 1.00 mg/dL 9.14  7.82  9.56   Sodium 135 - 145 mmol/L 140  140  139   Potassium 3.5 - 5.1 mmol/L 4.2  3.8  4.0   Chloride 98 - 111 mmol/L 105  105  104   CO2 22 - 32 mmol/L Calcium 8.9 - 10.3 mg/dL 21.3  9.9  08.6    Total Protein 6.5 - 8.1 g/dL 7.5  7.1  7.5   Total Bilirubin 0.3 - 1.2 mg/dL 0.3  0.4  0.3   Alkaline Phos 38 - 126 U/L 130  107  93   AST 15 - 41 U/L ALT 0 - 44 U/L RADIOGRAPHIC STUDIES (from last 24 hours if applicable) I have personally reviewed the radiological images as listed and agreed with the findings in the report. DG Chest 2 View  Result Date: 04/23/2022 CLINICAL DATA:  Porta-catheter placement. History of breast cancer. EXAM: CHEST - 2 VIEW COMPARISON:  CTA chest 09/17/2021 FINDINGS: Left chest wall porta catheter tip overlies the central superior vena cava. This appears to be a different porta-catheter than was present on the prior 09/17/2021 CT, and that prior porta catheter tip extended more distally to overlie the superior right atrium. Cardiac silhouette and mediastinal contours are within normal limits. The lungs are clear. No pleural effusion pneumothorax. Surgical clips overlie the right breast. Mild multilevel degenerative disc changes of the upper thoracic spine. IMPRESSION: Left chest wall porta catheter tip overlies the central superior vena cava. No pneumothorax. Electronically Signed   By: Neita Garnet M.D.   On: 04/23/2022 14:02        Visit Diagnosis: 1. Port-A-Cath in place   2. Malignant neoplasm of upper-outer quadrant of right breast in female, estrogen receptor negative      No orders of the defined types were placed in this encounter.   All questions were answered. The patient knows to call the clinic with any problems, questions or concerns. No barriers to learning was detected.  I have spent a total of 20 minutes minutes of face-to-face and non-face-to-face time, preparing to see the patient, obtaining and/or reviewing separately obtained history, performing a medically appropriate examination, counseling and educating the patient, ordering tests, documenting clinical information in the electronic health record, and  care coordination (communications with other health care professionals or caregivers).    Thank you for allowing me to participate in the care of this patient.    Shanon Ace, PA-C Department of Hematology/Oncology [facility] at [facility] Phone: [phone]  [phone]    04/23/2022 4:36 PM

## 2022-04-26 ENCOUNTER — Ambulatory Visit: Payer: Federal, State, Local not specified - PPO | Admitting: Physical Therapy

## 2022-04-26 ENCOUNTER — Encounter: Payer: Self-pay | Admitting: Physical Therapy

## 2022-04-26 DIAGNOSIS — R293 Abnormal posture: Secondary | ICD-10-CM | POA: Diagnosis not present

## 2022-04-26 DIAGNOSIS — M25611 Stiffness of right shoulder, not elsewhere classified: Secondary | ICD-10-CM | POA: Diagnosis not present

## 2022-04-26 DIAGNOSIS — C50411 Malignant neoplasm of upper-outer quadrant of right female breast: Secondary | ICD-10-CM | POA: Diagnosis not present

## 2022-04-26 DIAGNOSIS — M25511 Pain in right shoulder: Secondary | ICD-10-CM

## 2022-04-26 DIAGNOSIS — N63 Unspecified lump in unspecified breast: Secondary | ICD-10-CM | POA: Diagnosis not present

## 2022-04-26 DIAGNOSIS — Z171 Estrogen receptor negative status [ER-]: Secondary | ICD-10-CM | POA: Diagnosis not present

## 2022-04-26 NOTE — Therapy (Signed)
OUTPATIENT PHYSICAL THERAPY ONCOLOGY TREATMENT  Patient Name: Patricia Houston MRN: [id number] DOB:[birth year], 57 y.o., female Today's Date: 04/26/2022  END OF SESSION:  PT End of Session - 04/26/22 1609     Visit Number 2    Number of Visits 8    Date for PT Re-Evaluation 05/05/22    PT Start Time 1608    PT Stop Time 1651    PT Time Calculation (min) 43 min    Activity Tolerance Patient tolerated treatment well    Behavior During Therapy WFL for tasks assessed/performed             Past Medical History:  Diagnosis Date   Anemia    TAKES IRON PILLS   Anxiety    Cancer    right breast DCIS   Depression    PONV (postoperative nausea and vomiting)    Past Surgical History:  Procedure Laterality Date   ABDOMINAL HYSTERECTOMY     BREAST LUMPECTOMY WITH RADIOACTIVE SEED AND SENTINEL LYMPH NODE BIOPSY Right 11/03/2021   Procedure: RIGHT BREAST BRACKETED LUMPECTOMY WITH RADIOACTIVE SEED AND SENTINEL LYMPH NODE BIOPSY;  Surgeon: Harriette Bouillon, MD;  Location: [facility];  Service: General;  Laterality: Right;   BREAST LUMPECTOMY WITH RADIOACTIVE SEED LOCALIZATION Right 06/14/2018   Procedure: RIGHT BREAST RADIOACTIVE SEED LOCALIZATION LUMPECTOMY X2;  Surgeon: Harriette Bouillon, MD;  Location: [facility];  Service: General;  Laterality: Right;   BREAST LUMPECTOMY WITH RADIOACTIVE SEED LOCALIZATION Left 11/03/2021   Procedure: LEFT BREAST BRACKETED LUMPECTOMY WITH RADIOACTIVE SEED LOCALIZATION;  Surgeon: Harriette Bouillon, MD;  Location: [facility];  Service: General;  Laterality: Left;   IR IMAGING GUIDED PORT INSERTION  06/17/2021   LAPAROSCOPIC ASSISTED VAGINAL HYSTERECTOMY N/A 02/27/2014   Procedure: LAPAROSCOPIC ASSISTED VAGINAL HYSTERECTOMY;  Surgeon: Dorien Chihuahua. Richardson Dopp, MD;  Location: [facility];  Service: Gynecology;  Laterality: N/A;  abdomen to vagina to abdomen   MASS EXCISION Right 02/16/2022   Procedure: EXCISION RIGHT BACK MASS/CYST;   Surgeon: Harriette Bouillon, MD;  Location: [facility];  Service: General;  Laterality: Right;  GEN AND LOCAL   TONSILLECTOMY     Patient Active Problem List   Diagnosis Date Noted   Vitamin D deficiency 07/09/2021   Iron deficiency anemia 07/09/2021   Hyperlipidemia 07/09/2021   Port-A-Cath in place 06/18/2021   Malignant neoplasm of upper-outer quadrant of right breast in female, estrogen receptor negative 05/27/2021   Atypical ductal hyperplasia of right breast 07/18/2018   Genetic testing 03/31/2018   Family history of breast cancer in mother 04/03/2015   Family history of pancreatic cancer 04/03/2015   S/P hysterectomy 02/27/2014   ANEMIA 07/01/2009     REFERRING PROVIDER: Serena Croissant, MD  REFERRING DIAG: s/p Right Breast Cancer  THERAPY DIAG:  Swelling of breast  Acute pain of right shoulder  Stiffness of right shoulder, not elsewhere classified  Abnormal posture  Malignant neoplasm of upper-outer quadrant of right breast in female, estrogen receptor negative  ONSET DATE: 11/04/2022  Rationale for Evaluation and Treatment: Rehabilitation  SUBJECTIVE:  SUBJECTIVE STATEMENT: I am not has tight as I was but I still have discomfort in my breast.   PERTINENT HISTORY:   Breast cancer recurrence noted 05/19/21. Hx of Rt lumpectomy  in 2020 with Dr. Luisa Hart.  0/1 lymph node removed. Treatment was neoadjuvant chemotherapy TCHP followed by right and left lumpectomies and right SLNB  with 0+/4 LN on 10/31//2023 and radiation which ended on 12/25/2021    PAIN:  Are you having pain? Yes NPRS scale: 2/10 with occasional shooting pains up to 4/10 Pain location: right breast Pain orientation: Right  PAIN TYPE: sharp and tender Pain description: intermittent  Aggravating factors:  laying on the right side, rolling over Relieving factors: staying off that side  PRECAUTIONS: Right UE lymphedema risk  WEIGHT BEARING RESTRICTIONS: No  FALLS:  Has patient fallen in last 6 months? No  LIVING ENVIRONMENT: Lives with: lives with their spouse Lives in: House/apartment Stairs: Yes; Internal: 1 flight steps; on right going up and External: 3 steps; none Has following equipment at home: None  OCCUPATION: coordinator mostly computer  LEISURE: walking, riding peleton, reading  HAND DOMINANCE: left   PRIOR LEVEL OF FUNCTION: Independent  PATIENT GOALS: relieve tightness/soreness, be able to wash middle of back, lay on right side again.   OBJECTIVE:  COGNITION: Overall cognitive status: Within functional limits for tasks assessed   PALPATION: Tender right sternal and axillary border of pectorals  OBSERVATIONS / OTHER ASSESSMENTS: right breast mildly drawn up from radiation, skin thickening. Fibrosis noted medial to breast incision, mild generalized swelling.  SENSATION: Light touch: Deficits     POSTURE: forward head, rounded shoulders  UPPER EXTREMITY AROM/PROM:  A/PROM RIGHT   eval   Shoulder extension 47 tight  Shoulder flexion 140  Shoulder abduction 155  Shoulder internal rotation 32  Shoulder external rotation 99    (Blank rows = not tested)  A/PROM LEFT   eval  Shoulder extension 63  Shoulder flexion 162  Shoulder abduction 178  Shoulder internal rotation 67  Shoulder external rotation 90    (Blank rows = not tested)  CERVICAL AROM: All within functional limits:   RECENT SOZO in March WNL   UPPER EXTREMITY STRENGTH: WFL  LYMPHEDEMA ASSESSMENTS:   SURGERY TYPE/DATE: 11/03/2021 Right and left lumpectomies with Right SLNB, right Lumpectomy 2020 with SLNB  NUMBER OF LYMPH NODES REMOVED: total of 5  CHEMOTHERAPY: Yes  RADIATION:YES  HORMONE TREATMENT: NO  INFECTIONS: NO  LYMPHEDEMA ASSESSMENTS:   LANDMARK RIGHT  eval   10 cm proximal to olecranon process   Olecranon process   10 cm proximal to ulnar styloid process   Just proximal to ulnar styloid process   Across hand at thumb web space   At base of 2nd digit   (Blank rows = not tested)  LANDMARK LEFT  eval  10 cm proximal to olecranon process   Olecranon process   10 cm proximal to ulnar styloid process   Just proximal to ulnar styloid process   Across hand at thumb web space   At base of 2nd digit   (Blank rows = not tested)     QUICK DASH SURVEY: 16% BREAST COMPLAINTS QUESTIONNAIRE Pain 6 Heaviness:3 Swollen feeling:6 Tense Skin:2 Redness:2 Bra Print:0 Size of Pores:0 Hard feeling: 8 Total:  27   /80 A Score over 9 indicates lymphedema issues in the breast    TODAY'S TREATMENT:  DATE:  04/26/22: Educated pt in doorway stretch for pecs with pt feeling a good stretch with this - educated her to hold for 30 sec. Also instructed pt and had her return demo supine with arms in goal post position and knees to L side for R lateral trunk stretch with 30 sec holds with pt also feeling good stretch with this. STM to areas of tightness using cocoa butter in area of inferior breast, lumpectomy scar and R pec where scar tissue and muscle tissue was palpable with noticeable improvement in texture by end of session.   Eval: Educated pt in supine wand exs for flexion and scaption, supine star gazer and wall slides for abduction all x 4-5 reps.Pt demonstrated good improvement in ROM after exs. Discussed POC to address tight muscles, and breast swelling  PATIENT EDUCATION:  Education details: as above Person educated: Patient Education method: Programmer, multimedia, Facilities manager, and Handouts Education comprehension: verbalized understanding and returned demonstration  HOME EXERCISE PROGRAM: Supine wand flexion and  scaption, supine star gazer, standing wall slides LTR with arms in goal post Doorway stretch  ASSESSMENT:  CLINICAL IMPRESSION: Began soft tissue mobilization to areas of tightness and discomfort in inferior breast, R pec, and R lumpectomy scar. Pt and therapist could palpate a big improvement in texture by end of session. Encouraged pt to bring in her compression bra so therapist can assess fit and add foam as  needed to improve comfort. Instruct pt in pec stretch and lateral trunk stretch to decrease tightness.   OBJECTIVE IMPAIRMENTS: decreased knowledge of condition, decreased ROM, increased edema, and impaired UE functional use.   ACTIVITY LIMITATIONS: sleeping, dressing, and reach over head  PARTICIPATION LIMITATIONS:  pt is doing all that is required of her  PERSONAL FACTORS: 1-2 comorbidities: bilateral breast lumpectomies with chemo and  right radiation  are also affecting patient's functional outcome.   REHAB POTENTIAL: Excellent  CLINICAL DECISION MAKING: Stable/uncomplicated  EVALUATION COMPLEXITY: Low  GOALS: Goals reviewed with patient? Yes  SHORT TERM GOALS= LONG TERM GOALS: Target date: 05/05/2022  Pt will be independent in MLD to reduce Right breast swelling Baseline: Goal status: INITIAL  2.  Pt will be compliant with compression bra or sports bra to reduce right breast swelling Baseline:  Goal status: INITIAL  3.  Pt will have right shoulder ROM WNL to resume usual activities without limitation Baseline:  Goal status: INITIAL  4.  Pt will be able to sleep on right side without complaint Baseline:  Goal status: INITIAL  5.  Pt will be able to fasten bra behind back with improved ease Baseline:  Goal status: INITIAL  6.  Breast complaints survey will be no greater than 9 Baseline:  Goal status: INITIAL    PLAN:  PT FREQUENCY: 1-2x/week PT DURATION: 4 weeks  PLANNED INTERVENTIONS: Therapeutic exercises, Therapeutic activity, Neuromuscular  re-education, Patient/Family education, Self Care, Joint mobilization, Orthotic/Fit training, Dry Needling, Manual lymph drainage, scar mobilization, Vasopneumatic device, Manual therapy, and Re-evaluation  PLAN FOR NEXT SESSION:  Review HEP prn. Add standing lat stretch, how are stretches given? Add to print out, STM to right [place], [place], lateral trunk, PROM prn, MLD to right breast and instruct pt.   [address] [place],  04/26/2022, 5:03 PM

## 2022-04-28 ENCOUNTER — Encounter: Payer: Self-pay | Admitting: Physical Therapy

## 2022-04-28 ENCOUNTER — Ambulatory Visit: Payer: Federal, State, Local not specified - PPO | Admitting: Physical Therapy

## 2022-04-28 DIAGNOSIS — C50411 Malignant neoplasm of upper-outer quadrant of right female breast: Secondary | ICD-10-CM | POA: Diagnosis not present

## 2022-04-28 DIAGNOSIS — M25511 Pain in right shoulder: Secondary | ICD-10-CM | POA: Diagnosis not present

## 2022-04-28 DIAGNOSIS — M25611 Stiffness of right shoulder, not elsewhere classified: Secondary | ICD-10-CM | POA: Diagnosis not present

## 2022-04-28 DIAGNOSIS — Z171 Estrogen receptor negative status [ER-]: Secondary | ICD-10-CM

## 2022-04-28 DIAGNOSIS — R293 Abnormal posture: Secondary | ICD-10-CM | POA: Diagnosis not present

## 2022-04-28 DIAGNOSIS — N63 Unspecified lump in unspecified breast: Secondary | ICD-10-CM

## 2022-04-28 NOTE — Therapy (Addendum)
OUTPATIENT PHYSICAL THERAPY ONCOLOGY TREATMENT  Patient Name: Patricia Houston MRN: [id number] DOB:May 31, 1965, 57 y.o., female Today's Date: 04/28/2022  END OF SESSION:  PT End of Session - 04/28/22 1600     Visit Number 3    Number of Visits 8    Date for PT Re-Evaluation 05/05/22    PT Start Time 1558    PT Stop Time 1650    PT Time Calculation (min) 52 min    Activity Tolerance Patient tolerated treatment well    Behavior During Therapy WFL for tasks assessed/performed             Past Medical History:  Diagnosis Date   Anemia    TAKES IRON PILLS   Anxiety    Cancer    right breast DCIS   Depression    PONV (postoperative nausea and vomiting)    Past Surgical History:  Procedure Laterality Date   ABDOMINAL HYSTERECTOMY     BREAST LUMPECTOMY WITH RADIOACTIVE SEED AND SENTINEL LYMPH NODE BIOPSY Right 11/03/2021   Procedure: RIGHT BREAST BRACKETED LUMPECTOMY WITH RADIOACTIVE SEED AND SENTINEL LYMPH NODE BIOPSY;  Surgeon: Harriette Bouillon, MD;  Location: [facility];  Service: General;  Laterality: Right;   BREAST LUMPECTOMY WITH RADIOACTIVE SEED LOCALIZATION Right 06/14/2018   Procedure: RIGHT BREAST RADIOACTIVE SEED LOCALIZATION LUMPECTOMY X2;  Surgeon: Harriette Bouillon, MD;  Location: [facility];  Service: General;  Laterality: Right;   BREAST LUMPECTOMY WITH RADIOACTIVE SEED LOCALIZATION Left 11/03/2021   Procedure: LEFT BREAST BRACKETED LUMPECTOMY WITH RADIOACTIVE SEED LOCALIZATION;  Surgeon: Harriette Bouillon, MD;  Location: [facility];  Service: General;  Laterality: Left;   IR IMAGING GUIDED PORT INSERTION  06/17/2021   LAPAROSCOPIC ASSISTED VAGINAL HYSTERECTOMY N/A 02/27/2014   Procedure: LAPAROSCOPIC ASSISTED VAGINAL HYSTERECTOMY;  Surgeon: Dorien Chihuahua. Richardson Dopp, MD;  Location: [facility];  Service: Gynecology;  Laterality: N/A;  abdomen to vagina to abdomen   MASS EXCISION Right 02/16/2022   Procedure: EXCISION RIGHT BACK MASS/CYST;   Surgeon: Harriette Bouillon, MD;  Location: [facility];  Service: General;  Laterality: Right;  GEN AND LOCAL   TONSILLECTOMY     Patient Active Problem List   Diagnosis Date Noted   Vitamin D deficiency 07/09/2021   Iron deficiency anemia 07/09/2021   Hyperlipidemia 07/09/2021   Port-A-Cath in place 06/18/2021   Malignant neoplasm of upper-outer quadrant of right breast in female, estrogen receptor negative 05/27/2021   Atypical ductal hyperplasia of right breast 07/18/2018   Genetic testing 03/31/2018   Family history of breast cancer in mother 04/03/2015   Family history of pancreatic cancer 04/03/2015   S/P hysterectomy 02/27/2014   ANEMIA 07/01/2009     REFERRING PROVIDER: Serena Croissant, MD  REFERRING DIAG: s/p Right Breast Cancer  THERAPY DIAG:  Swelling of breast  Acute pain of right shoulder  Stiffness of right shoulder, not elsewhere classified  Abnormal posture  Malignant neoplasm of upper-outer quadrant of right breast in female, estrogen receptor negative  ONSET DATE: 11/04/2022  Rationale for Evaluation and Treatment: Rehabilitation  SUBJECTIVE:  SUBJECTIVE STATEMENT: I did not have any sharp pains today when I would reach with my arm. Last session helped.   PERTINENT HISTORY:   Breast cancer recurrence noted 05/19/21. Hx of Rt lumpectomy  in 2020 with Dr. Luisa Hart.  0/1 lymph node removed. Treatment was neoadjuvant chemotherapy TCHP followed by right and left lumpectomies and right SLNB  with 0+/4 LN on 10/31//2023 and radiation which ended on 12/25/2021    PAIN:  Are you having pain? No  PRECAUTIONS: Right UE lymphedema risk  WEIGHT BEARING RESTRICTIONS: No  FALLS:  Has patient fallen in last 6 months? No  LIVING ENVIRONMENT: Lives with: lives with  their spouse Lives in: House/apartment Stairs: Yes; Internal: 1 flight steps; on right going up and External: 3 steps; none Has following equipment at home: None  OCCUPATION: coordinator mostly computer  LEISURE: walking, riding peleton, reading  HAND DOMINANCE: left   PRIOR LEVEL OF FUNCTION: Independent  PATIENT GOALS: relieve tightness/soreness, be able to wash middle of back, lay on right side again.   OBJECTIVE:  COGNITION: Overall cognitive status: Within functional limits for tasks assessed   PALPATION: Tender right sternal and axillary border of pectorals  OBSERVATIONS / OTHER ASSESSMENTS: right breast mildly drawn up from radiation, skin thickening. Fibrosis noted medial to breast incision, mild generalized swelling.  SENSATION: Light touch: Deficits     POSTURE: forward head, rounded shoulders  UPPER EXTREMITY AROM/PROM:  A/PROM RIGHT   eval   Shoulder extension 47 tight  Shoulder flexion 140  Shoulder abduction 155  Shoulder internal rotation 32  Shoulder external rotation 99    (Blank rows = not tested)  A/PROM LEFT   eval  Shoulder extension 63  Shoulder flexion 162  Shoulder abduction 178  Shoulder internal rotation 67  Shoulder external rotation 90    (Blank rows = not tested)  CERVICAL AROM: All within functional limits:   RECENT SOZO in March WNL   UPPER EXTREMITY STRENGTH: WFL  LYMPHEDEMA ASSESSMENTS:   SURGERY TYPE/DATE: 11/03/2021 Right and left lumpectomies with Right SLNB, right Lumpectomy 2020 with SLNB  NUMBER OF LYMPH NODES REMOVED: total of 5  CHEMOTHERAPY: Yes  RADIATION:YES  HORMONE TREATMENT: NO  INFECTIONS: NO  LYMPHEDEMA ASSESSMENTS:   LANDMARK RIGHT  eval  10 cm proximal to olecranon process   Olecranon process   10 cm proximal to ulnar styloid process   Just proximal to ulnar styloid process   Across hand at thumb web space   At base of 2nd digit   (Blank rows = not tested)  LANDMARK LEFT  eval   10 cm proximal to olecranon process   Olecranon process   10 cm proximal to ulnar styloid process   Just proximal to ulnar styloid process   Across hand at thumb web space   At base of 2nd digit   (Blank rows = not tested)     QUICK DASH SURVEY: 16% BREAST COMPLAINTS QUESTIONNAIRE Pain 6 Heaviness:3 Swollen feeling:6 Tense Skin:2 Redness:2 Bra Print:0 Size of Pores:0 Hard feeling: 8 Total:  27   /80 A Score over 9 indicates lymphedema issues in the breast    TODAY'S TREATMENT:  DATE:  04/28/22: In supine:In supine: Short neck, 5 diaphragmatic breaths, L axillary nodes and establishment of interaxillary pathway, R inguinal nodes and establishment of axilloinguinal pathway, then R breast moving fluid towards pathways spending extra time in any areas of fibrosis then retracing all steps.  Instructed pt throughout in correct skin stretch technique, anatomy and physiology of the lymphatic system and sequence.    04/26/22: Educated pt in doorway stretch for pecs with pt feeling a good stretch with this - educated her to hold for 30 sec. Also instructed pt and had her return demo supine with arms in goal post position and knees to L side for R lateral trunk stretch with 30 sec holds with pt also feeling good stretch with this. STM to areas of tightness using cocoa butter in area of inferior breast, lumpectomy scar and R pec where scar tissue and muscle tissue was palpable with noticeable improvement in texture by end of session.   Eval: Educated pt in supine wand exs for flexion and scaption, supine star gazer and wall slides for abduction all x 4-5 reps.Pt demonstrated good improvement in ROM after exs. Discussed POC to address tight muscles, and breast swelling  PATIENT EDUCATION:  Education details: as above Person educated: Patient Education  method: Programmer, multimedia, Demonstration, and Handouts Education comprehension: verbalized understanding and returned demonstration  HOME EXERCISE PROGRAM: Supine wand flexion and scaption, supine star gazer, standing wall slides LTR with arms in goal post Doorway stretch  ASSESSMENT:  CLINICAL IMPRESSION: Began MLD to R breast today. Educated pt throughout will issue handout at next session and have pt return demonstrate. She has been tolerating her compression bra. Cut 1/2 in grey foam and placed in thick stockinette for pt to wear to prevent her bra from rubbing her R axilla. Pt repots she has had less pain since her last PT session and has been able to reach without pain.   OBJECTIVE IMPAIRMENTS: decreased knowledge of condition, decreased ROM, increased edema, and impaired UE functional use.   ACTIVITY LIMITATIONS: sleeping, dressing, and reach over head  PARTICIPATION LIMITATIONS:  pt is doing all that is required of her  PERSONAL FACTORS: 1-2 comorbidities: bilateral breast lumpectomies with chemo and  right radiation  are also affecting patient's functional outcome.   REHAB POTENTIAL: Excellent  CLINICAL DECISION MAKING: Stable/uncomplicated  EVALUATION COMPLEXITY: Low  GOALS: Goals reviewed with patient? Yes  SHORT TERM GOALS= LONG TERM GOALS: Target date: 05/05/2022  Pt will be independent in MLD to reduce Right breast swelling Baseline: Goal status: INITIAL  2.  Pt will be compliant with compression bra or sports bra to reduce right breast swelling Baseline:  Goal status: INITIAL  3.  Pt will have right shoulder ROM WNL to resume usual activities without limitation Baseline:  Goal status: INITIAL  4.  Pt will be able to sleep on right side without complaint Baseline:  Goal status: INITIAL  5.  Pt will be able to fasten bra behind back with improved ease Baseline:  Goal status: INITIAL  6.  Breast complaints survey will be no greater than 9 Baseline:  Goal  status: INITIAL    PLAN:  PT FREQUENCY: 1-2x/week PT DURATION: 4 weeks  PLANNED INTERVENTIONS: Therapeutic exercises, Therapeutic activity, Neuromuscular re-education, Patient/Family education, Self Care, Joint mobilization, Orthotic/Fit training, Dry Needling, Manual lymph drainage, scar mobilization, Vasopneumatic device, Manual therapy, and Re-evaluation  PLAN FOR NEXT SESSION:  Review HEP prn. Add standing lat stretch, how are stretches given? Add to print out, STM  to right [place], [place], lateral trunk, PROM prn, MLD to right breast and instruct pt.   [facility], PT 04/28/2022, 4:52 PM

## 2022-04-29 ENCOUNTER — Inpatient Hospital Stay (HOSPITAL_BASED_OUTPATIENT_CLINIC_OR_DEPARTMENT_OTHER): Payer: Federal, State, Local not specified - PPO | Admitting: Adult Health

## 2022-04-29 ENCOUNTER — Inpatient Hospital Stay: Payer: Federal, State, Local not specified - PPO

## 2022-04-29 ENCOUNTER — Encounter: Payer: Self-pay | Admitting: *Deleted

## 2022-04-29 ENCOUNTER — Encounter: Payer: Self-pay | Admitting: Adult Health

## 2022-04-29 ENCOUNTER — Other Ambulatory Visit: Payer: Self-pay

## 2022-04-29 VITALS — BP 114/80 | HR 76 | Temp 97.9°F | Resp 16

## 2022-04-29 DIAGNOSIS — Z8249 Family history of ischemic heart disease and other diseases of the circulatory system: Secondary | ICD-10-CM | POA: Diagnosis not present

## 2022-04-29 DIAGNOSIS — Z809 Family history of malignant neoplasm, unspecified: Secondary | ICD-10-CM | POA: Diagnosis not present

## 2022-04-29 DIAGNOSIS — Z800 Family history of malignant neoplasm of digestive organs: Secondary | ICD-10-CM | POA: Diagnosis not present

## 2022-04-29 DIAGNOSIS — C50411 Malignant neoplasm of upper-outer quadrant of right female breast: Secondary | ICD-10-CM

## 2022-04-29 DIAGNOSIS — E785 Hyperlipidemia, unspecified: Secondary | ICD-10-CM | POA: Diagnosis not present

## 2022-04-29 DIAGNOSIS — Z5181 Encounter for therapeutic drug level monitoring: Secondary | ICD-10-CM

## 2022-04-29 DIAGNOSIS — I427 Cardiomyopathy due to drug and external agent: Secondary | ICD-10-CM

## 2022-04-29 DIAGNOSIS — Z171 Estrogen receptor negative status [ER-]: Secondary | ICD-10-CM

## 2022-04-29 DIAGNOSIS — D72819 Decreased white blood cell count, unspecified: Secondary | ICD-10-CM | POA: Diagnosis not present

## 2022-04-29 DIAGNOSIS — Z90710 Acquired absence of both cervix and uterus: Secondary | ICD-10-CM | POA: Diagnosis not present

## 2022-04-29 DIAGNOSIS — Z5112 Encounter for antineoplastic immunotherapy: Secondary | ICD-10-CM | POA: Diagnosis not present

## 2022-04-29 DIAGNOSIS — Z79899 Other long term (current) drug therapy: Secondary | ICD-10-CM | POA: Diagnosis not present

## 2022-04-29 DIAGNOSIS — Z95828 Presence of other vascular implants and grafts: Secondary | ICD-10-CM

## 2022-04-29 DIAGNOSIS — Z803 Family history of malignant neoplasm of breast: Secondary | ICD-10-CM | POA: Diagnosis not present

## 2022-04-29 DIAGNOSIS — M47814 Spondylosis without myelopathy or radiculopathy, thoracic region: Secondary | ICD-10-CM | POA: Diagnosis not present

## 2022-04-29 DIAGNOSIS — Z832 Family history of diseases of the blood and blood-forming organs and certain disorders involving the immune mechanism: Secondary | ICD-10-CM | POA: Diagnosis not present

## 2022-04-29 DIAGNOSIS — R5383 Other fatigue: Secondary | ICD-10-CM | POA: Diagnosis not present

## 2022-04-29 LAB — CBC WITH DIFFERENTIAL (CANCER CENTER ONLY)
Abs Immature Granulocytes: 0.01 10*3/uL (ref 0.00–0.07)
Basophils Absolute: 0 10*3/uL (ref 0.0–0.1)
Basophils Relative: 1 %
Eosinophils Absolute: 0.2 10*3/uL (ref 0.0–0.5)
Eosinophils Relative: 7 %
HCT: 32.9 % — ABNORMAL LOW (ref 36.0–46.0)
Hemoglobin: 10.7 g/dL — ABNORMAL LOW (ref 12.0–15.0)
Immature Granulocytes: 0 %
Lymphocytes Relative: 30 %
Lymphs Abs: 1 10*3/uL (ref 0.7–4.0)
MCH: 29.3 pg (ref 26.0–34.0)
MCHC: 32.5 g/dL (ref 30.0–36.0)
MCV: 90.1 fL (ref 80.0–100.0)
Monocytes Absolute: 0.3 10*3/uL (ref 0.1–1.0)
Monocytes Relative: 8 %
Neutro Abs: 1.8 10*3/uL (ref 1.7–7.7)
Neutrophils Relative %: 54 %
Platelet Count: 169 10*3/uL (ref 150–400)
RBC: 3.65 MIL/uL — ABNORMAL LOW (ref 3.87–5.11)
RDW: 13.4 % (ref 11.5–15.5)
WBC Count: 3.4 10*3/uL — ABNORMAL LOW (ref 4.0–10.5)
nRBC: 0 % (ref 0.0–0.2)

## 2022-04-29 LAB — CMP (CANCER CENTER ONLY)
ALT: 18 U/L (ref 0–44)
AST: 15 U/L (ref 15–41)
Albumin: 4.1 g/dL (ref 3.5–5.0)
Alkaline Phosphatase: 130 U/L — ABNORMAL HIGH (ref 38–126)
Anion gap: 5 (ref 5–15)
BUN: 14 mg/dL (ref 6–20)
CO2: 29 mmol/L (ref 22–32)
Calcium: 10.1 mg/dL (ref 8.9–10.3)
Chloride: 106 mmol/L (ref 98–111)
Creatinine: 1.12 mg/dL — ABNORMAL HIGH (ref 0.44–1.00)
GFR, Estimated: 58 mL/min — ABNORMAL LOW (ref >60)
Glucose, Bld: 106 mg/dL — ABNORMAL HIGH (ref 70–99)
Potassium: 4.1 mmol/L (ref 3.5–5.1)
Sodium: 140 mmol/L (ref 135–145)
Total Bilirubin: 0.4 mg/dL (ref 0.3–1.2)
Total Protein: 7.2 g/dL (ref 6.5–8.1)

## 2022-04-29 LAB — IRON AND IRON BINDING CAPACITY (CC-WL,HP ONLY)
Iron: 67 ug/dL (ref 28–170)
Saturation Ratios: 19 % (ref 10.4–31.8)
TIBC: 363 ug/dL (ref 250–450)
UIBC: 296 ug/dL (ref 148–442)

## 2022-04-29 LAB — FERRITIN: Ferritin: 119 ng/mL (ref 11–307)

## 2022-04-29 LAB — HEMOGLOBIN A1C
Hgb A1c MFr Bld: 6 % — ABNORMAL HIGH (ref 4.8–5.6)
Mean Plasma Glucose: 125.5 mg/dL

## 2022-04-29 LAB — VITAMIN B12: Vitamin B-12: 687 pg/mL (ref 180–914)

## 2022-04-29 LAB — VITAMIN D 25 HYDROXY (VIT D DEFICIENCY, FRACTURES): Vit D, 25-Hydroxy: 42.84 ng/mL (ref 30–100)

## 2022-04-29 MED ORDER — TRASTUZUMAB-DKST CHEMO 150 MG IV SOLR
6.0000 mg/kg | Freq: Once | INTRAVENOUS | Status: AC
  Administered 2022-04-29: 483 mg via INTRAVENOUS
  Filled 2022-04-29: qty 23

## 2022-04-29 MED ORDER — ACETAMINOPHEN 325 MG PO TABS
650.0000 mg | ORAL_TABLET | Freq: Once | ORAL | Status: AC
  Administered 2022-04-29: 650 mg via ORAL
  Filled 2022-04-29: qty 2

## 2022-04-29 MED ORDER — HEPARIN SOD (PORK) LOCK FLUSH 100 UNIT/ML IV SOLN
500.0000 [IU] | Freq: Once | INTRAVENOUS | Status: AC | PRN
  Administered 2022-04-29: 500 [IU]

## 2022-04-29 MED ORDER — SODIUM CHLORIDE 0.9 % IV SOLN: Freq: Once | INTRAVENOUS | Status: AC

## 2022-04-29 MED ORDER — SODIUM CHLORIDE 0.9 % IV SOLN
420.0000 mg | Freq: Once | INTRAVENOUS | Status: AC
  Administered 2022-04-29: 420 mg via INTRAVENOUS
  Filled 2022-04-29: qty 14

## 2022-04-29 MED ORDER — SODIUM CHLORIDE 0.9% FLUSH
10.0000 mL | INTRAVENOUS | Status: DC | PRN
  Administered 2022-04-29: 10 mL

## 2022-04-29 MED ORDER — DIPHENHYDRAMINE HCL 25 MG PO CAPS
25.0000 mg | ORAL_CAPSULE | Freq: Once | ORAL | Status: AC
  Administered 2022-04-29: 25 mg via ORAL
  Filled 2022-04-29: qty 1

## 2022-04-29 MED ORDER — SODIUM CHLORIDE 0.9% FLUSH
10.0000 mL | Freq: Once | INTRAVENOUS | Status: AC
  Administered 2022-04-29: 10 mL

## 2022-04-29 NOTE — Assessment & Plan Note (Signed)
This is a 57 year old woman with history of stage IIa ER/PR negative HER2 positive right breast invasive ductal carcinoma diagnosed in May 2023 status post neoadjuvant chemotherapy, right breast lumpectomy, adjuvant radiation, continuing on Herceptin Perjeta through May 2024.   Treatment Plan: 1. Neoadjuvant chemotherapy with TCH Perjeta 6 cycles followed by Herceptin Perjeta maintenance versus Kadcyla maintenance (based on response to neoadjuvant chemo) for 1 year 2. 11/03/2021: Right lumpectomy: Pathologic complete response, 0/4 lymph nodes, margins negative 3. Followed by adjuvant radiation therapy 12/08/2021-12/25/2021 ------------------------------------------------------------------------------------------------------------------------------- Herceptin/Perjeta toxicities:  At risk for heart failure: Most recent echocardiogram in December 2023, she is okay to treat with this echocardiogram result however we will work to get her next echo expedited. Fatigue: Unlikely related to Herceptin Perjeta however we will obtain vitamin D, vitamin B12, iron studies, and a hemoglobin A1c with her labs today. Leukopenia: Her total WBC has been as low as 2.7.  Please note for African-American patients the lower limit of normal for white blood cells is 3.5.  Today her white blood cell count is 3.4.  This is stable and she does not have any infections.  We will continue to monitor this.  Thelma Barge will return in 3 weeks for labs, follow-up with Dr. Pamelia Hoit, and her final Herceptin Perjeta.  I will see her back in June 2024 for her survivorship care plan visit.  I educated her on this visit and what it entails.

## 2022-04-29 NOTE — Patient Instructions (Signed)
[facility]  Discharge Instructions: Thank you for choosing [facility] to provide your oncology and hematology care.   If you have a lab appointment with the Cancer Center, please go directly to the Cancer Center and check in at the registration area.   Wear comfortable clothing and clothing appropriate for easy access to any Portacath or PICC line.   We strive to give you quality time with your provider. You may need to reschedule your appointment if you arrive late (15 or more minutes).  Arriving late affects you and other patients whose appointments are after yours.  Also, if you miss three or more appointments without notifying the office, you may be dismissed from the clinic at the provider's discretion.      For prescription refill requests, have your pharmacy contact our office and allow 72 hours for refills to be completed.    Today you received the following chemotherapy and/or immunotherapy agents: Herceptin/Perjeta.      To help prevent nausea and vomiting after your treatment, we encourage you to take your nausea medication as directed.  BELOW ARE SYMPTOMS THAT SHOULD BE REPORTED IMMEDIATELY: *FEVER GREATER THAN 100.4 F (38 C) OR HIGHER *CHILLS OR SWEATING *NAUSEA AND VOMITING THAT IS NOT CONTROLLED WITH YOUR NAUSEA MEDICATION *UNUSUAL SHORTNESS OF BREATH *UNUSUAL BRUISING OR BLEEDING *URINARY PROBLEMS (pain or burning when urinating, or frequent urination) *BOWEL PROBLEMS (unusual diarrhea, constipation, pain near the anus) TENDERNESS IN MOUTH AND THROAT WITH OR WITHOUT PRESENCE OF ULCERS (sore throat, sores in mouth, or a toothache) UNUSUAL RASH, SWELLING OR PAIN  UNUSUAL VAGINAL DISCHARGE OR ITCHING   Items with * indicate a potential emergency and should be followed up as soon as possible or go to the Emergency Department if any problems should occur.  Please show the CHEMOTHERAPY ALERT CARD or IMMUNOTHERAPY ALERT CARD  at check-in to the Emergency Department and triage nurse.  Should you have questions after your visit or need to cancel or reschedule your appointment, please contact [facility]  Dept: [phone]  and follow the prompts.  Office hours are 8:00 a.m. to 4:30 p.m. Monday - Friday. Please note that voicemails left after 4:00 p.m. may not be returned until the following business day.  We are closed weekends and major holidays. You have access to a nurse at all times for urgent questions. Please call the main number to the clinic Dept: [phone] and follow the prompts.   For any non-urgent questions, you may also contact your provider using MyChart. We now offer e-Visits for anyone 43 and older to request care online for non-urgent symptoms. For details visit [email or web address].   Also download the MyChart app! Go to the app store, search "MyChart", open the app, select , and log in with your MyChart username and password.

## 2022-04-29 NOTE — Progress Notes (Signed)
[facility] Cancer Follow up:    Wilfrid Lund, PA [address] [place] [address]   DIAGNOSIS:  Cancer Staging  Malignant neoplasm of upper-outer quadrant of right breast in female, estrogen receptor negative Staging form: Breast, AJCC 8th Edition - Clinical: Stage IIA (cT2, cN0, cM0, G3, ER-, PR-, HER2+) - Signed by Serena Croissant, MD on 05/27/2021 Stage prefix: Initial diagnosis Histologic grading system: 3 grade system   SUMMARY OF ONCOLOGIC HISTORY: Oncology History  Malignant neoplasm of upper-outer quadrant of right breast in female, estrogen receptor negative  06/14/2018 Surgery   06/14/2018: Right lumpectomy: Intraductal papilloma with usual ductal hyperplasia, 1 benign lymph node (decided against tamoxifen)   05/19/2021 Relapse/Recurrence   Screening mammogram detected right breast density.  2 lymph nodes were noted biopsy appointment was benign.  Ultrasound revealed 2.2 cm mass at 11 o'clock position right breast biopsy: Grade 3 IDC with necrosis, ER 0%, PR 0%, Ki-67 60%, HER2 3+ positive by [facility]   05/27/2021 Cancer Staging   Staging form: Breast, AJCC 8th Edition - Clinical: Stage IIA (cT2, cN0, cM0, G3, ER-, PR-, HER2+) - Signed by Serena Croissant, MD on 05/27/2021 Stage prefix: Initial diagnosis Histologic grading system: 3 grade system   06/18/2021 - 08/22/2021 Chemotherapy   Patient is on Treatment Plan : BREAST  Docetaxel + Carboplatin + Trastuzumab + Pertuzumab  (TCHP) q21d      06/18/2021 -  Chemotherapy   Patient is on Treatment Plan : BREAST  Docetaxel + Carboplatin + Trastuzumab + Pertuzumab  (TCHP) q21d / Trastuzumab + Pertuzumab q21d     11/03/2021 Surgery   Right lumpectomy: Pathologic complete response, 0/4 lymph nodes, margins negative    12/07/2021 - 12/25/2021 Radiation Therapy   Plan Name: Breast_R Site: Breast, Right Technique: 3D Mode: Photon Dose Per Fraction: 2.67 Gy Prescribed Dose (Delivered / Prescribed): 40.05 Gy / 40.05  Gy Prescribed Fxs (Delivered / Prescribed): 15 / 15       CURRENT THERAPY: Herceptin/Perjeta  INTERVAL HISTORY: KYANNE RIALS 57 y.o. female returns for follow-up prior to receiving her next cycle of Herceptin Perjeta.  She is tolerating this well.  Her most recent echocardiogram occurred on January 01, 2022 demonstrating a left ventricular ejection fraction of 55 to 60% with a normal global longitudinal strain.  Her next echo was scheduled in June 2024.  She is unsure why this got scheduled in June rather than sooner since it was ordered in March.  She is feeling increasingly tired.  She is sleeping more especially after work.  He has a history of iron deficiency vitamin D deficiency and B12 deficiency and these have not been checked recently.  She tells me she is taking her oral supplements that are on her med list.  Otherwise she is feeling well and without questions or concerns today.   Patient Active Problem List   Diagnosis Date Noted   Vitamin D deficiency 07/09/2021   Iron deficiency anemia 07/09/2021   Hyperlipidemia 07/09/2021   Port-A-Cath in place 06/18/2021   Malignant neoplasm of upper-outer quadrant of right breast in female, estrogen receptor negative 05/27/2021   Atypical ductal hyperplasia of right breast 07/18/2018   Genetic testing 03/31/2018   Family history of breast cancer in mother 04/03/2015   Family history of pancreatic cancer 04/03/2015   S/P hysterectomy 02/27/2014   ANEMIA 07/01/2009    has No Known Allergies.  MEDICAL HISTORY: Past Medical History:  Diagnosis Date   Anemia  TAKES IRON PILLS   Anxiety    Cancer    right breast DCIS   Depression    PONV (postoperative nausea and vomiting)     SURGICAL HISTORY: Past Surgical History:  Procedure Laterality Date   ABDOMINAL HYSTERECTOMY     BREAST LUMPECTOMY WITH RADIOACTIVE SEED AND SENTINEL LYMPH NODE BIOPSY Right 11/03/2021   Procedure: RIGHT BREAST BRACKETED LUMPECTOMY WITH  RADIOACTIVE SEED AND SENTINEL LYMPH NODE BIOPSY;  Surgeon: Harriette Bouillon, MD;  Location: [facility];  Service: General;  Laterality: Right;   BREAST LUMPECTOMY WITH RADIOACTIVE SEED LOCALIZATION Right 06/14/2018   Procedure: RIGHT BREAST RADIOACTIVE SEED LOCALIZATION LUMPECTOMY X2;  Surgeon: Harriette Bouillon, MD;  Location: [facility];  Service: General;  Laterality: Right;   BREAST LUMPECTOMY WITH RADIOACTIVE SEED LOCALIZATION Left 11/03/2021   Procedure: LEFT BREAST BRACKETED LUMPECTOMY WITH RADIOACTIVE SEED LOCALIZATION;  Surgeon: Harriette Bouillon, MD;  Location: [facility];  Service: General;  Laterality: Left;   IR IMAGING GUIDED PORT INSERTION  06/17/2021   LAPAROSCOPIC ASSISTED VAGINAL HYSTERECTOMY N/A 02/27/2014   Procedure: LAPAROSCOPIC ASSISTED VAGINAL HYSTERECTOMY;  Surgeon: Dorien Chihuahua. Richardson Dopp, MD;  Location: [facility];  Service: Gynecology;  Laterality: N/A;  abdomen to vagina to abdomen   MASS EXCISION Right 02/16/2022   Procedure: EXCISION RIGHT BACK MASS/CYST;  Surgeon: Harriette Bouillon, MD;  Location: [facility];  Service: General;  Laterality: Right;  GEN AND LOCAL   TONSILLECTOMY      SOCIAL HISTORY: Social History   Socioeconomic History   Marital status: Married    Spouse name: Not on file   Number of children: Not on file   Years of education: Not on file   Highest education level: Not on file  Occupational History   Not on file  Tobacco Use   Smoking status: Never   Smokeless tobacco: Never  Vaping Use   Vaping Use: Never used  Substance and Sexual Activity   Alcohol use: Yes    Comment: rare - 1-2 drinks every few months   Drug use: No   Sexual activity: Yes    Birth control/protection: Surgical  Other Topics Concern   Not on file  Social History Narrative   Not on file   Social Determinants of Health   Financial Resource Strain: Not on file  Food Insecurity: Not on file  Transportation Needs: No  Transportation Needs (06/20/2018)   PRAPARE - Administrator, Civil Service (Medical): No    Lack of Transportation (Non-Medical): No  Physical Activity: Not on file  Stress: Not on file  Social Connections: Not on file  Intimate Partner Violence: Not At Risk (06/20/2018)   Humiliation, Afraid, Rape, and Kick questionnaire    Fear of Current or Ex-Partner: No    Emotionally Abused: No    Physically Abused: No    Sexually Abused: No    FAMILY HISTORY: Family History  Problem Relation Age of Onset   Breast cancer Mother        dx. late 54s; s/p mastectomy   Pancreatic cancer Mother 27       adenocarcinoma   Other Sister        one sister had a hysterectomy due to heavy bleeding at the age of 28   Heart attack Maternal Uncle 41   Heart attack Maternal Grandmother 109   Prostate cancer Maternal Grandfather 14       "very aggressive"   Leukemia Other 62  maternal great aunt (MGM's sister)   Stomach cancer Other        maternal great aunt (MGF's sister) dx. over age 37   Heart Problems Paternal Grandmother    Cancer Other        maternal great uncle (MGM's brother) dx. NOS cancer at later age   Breast cancer Cousin        (2) maternal "2nd cousins" dx. with breast cancers in their mid-50s   Colon cancer Neg Hx     Review of Systems  Constitutional:  Positive for fatigue. Negative for appetite change, chills, fever and unexpected weight change.  HENT:   Negative for hearing loss, lump/mass and trouble swallowing.   Eyes:  Negative for eye problems and icterus.  Respiratory:  Negative for chest tightness, cough and shortness of breath.   Cardiovascular:  Negative for chest pain, leg swelling and palpitations.  Gastrointestinal:  Negative for abdominal distention, abdominal pain, constipation, diarrhea, nausea and vomiting.  Endocrine: Negative for hot flashes.  Genitourinary:  Negative for difficulty urinating.   Musculoskeletal:  Negative for arthralgias.   Skin:  Negative for itching and rash.  Neurological:  Negative for dizziness, extremity weakness, headaches and numbness.  Hematological:  Negative for adenopathy. Does not bruise/bleed easily.  Psychiatric/Behavioral:  Negative for depression. The patient is not nervous/anxious.       PHYSICAL EXAMINATION   Onc Performance Status - 04/29/22 0917       ECOG Perf Status   ECOG Perf Status Fully active, able to carry on all pre-disease performance without restriction      KPS SCALE   KPS % SCORE Able to carry on normal activity, minor s/s of disease             Vitals:   04/29/22 0914  BP: 112/71  Pulse: 85  Resp: 16  Temp: 98.1 F (36.7 C)  SpO2: 100%    Physical Exam Constitutional:      General: She is not in acute distress.    Appearance: Normal appearance. She is not toxic-appearing.  HENT:     Head: Normocephalic and atraumatic.  Eyes:     General: No scleral icterus. Cardiovascular:     Rate and Rhythm: Normal rate and regular rhythm.     Pulses: Normal pulses.     Heart sounds: Normal heart sounds.  Pulmonary:     Effort: Pulmonary effort is normal.     Breath sounds: Normal breath sounds.  Abdominal:     General: Abdomen is flat. Bowel sounds are normal. There is no distension.     Palpations: Abdomen is soft.     Tenderness: There is no abdominal tenderness.  Musculoskeletal:        General: No swelling.     Cervical back: Neck supple.  Lymphadenopathy:     Cervical: No cervical adenopathy.  Skin:    General: Skin is warm and dry.     Findings: No rash.  Neurological:     General: No focal deficit present.     Mental Status: She is alert.  Psychiatric:        Mood and Affect: Mood normal.        Behavior: Behavior normal.     LABORATORY DATA:  CBC    Component Value Date/Time   WBC 3.4 (L) 04/29/2022 0853   WBC 9.2 08/20/2021 0915   RBC 3.65 (L) 04/29/2022 0853   HGB 10.7 (L) 04/29/2022 0853   HCT 32.9 (L) 04/29/2022 1610  PLT  169 04/29/2022 0853   MCV 90.1 04/29/2022 0853   MCH 29.3 04/29/2022 0853   MCHC 32.5 04/29/2022 0853   RDW 13.4 04/29/2022 0853   LYMPHSABS 1.0 04/29/2022 0853   MONOABS 0.3 04/29/2022 0853   EOSABS 0.2 04/29/2022 0853   BASOSABS 0.0 04/29/2022 0853    CMP     Component Value Date/Time   NA 140 04/29/2022 0853   K 4.1 04/29/2022 0853   CL 106 04/29/2022 0853   CO2 29 04/29/2022 0853   GLUCOSE 106 (H) 04/29/2022 0853   BUN 14 04/29/2022 0853   CREATININE 1.12 (H) 04/29/2022 0853   CALCIUM 10.1 04/29/2022 0853   PROT 7.2 04/29/2022 0853   ALBUMIN 4.1 04/29/2022 0853   AST 15 04/29/2022 0853   ALT 18 04/29/2022 0853   ALKPHOS 130 (H) 04/29/2022 0853   BILITOT 0.4 04/29/2022 0853   GFRNONAA 58 (L) 04/29/2022 0853   GFRAA >60 06/12/2018 1100   GFRAA >60 03/22/2018 1242        ASSESSMENT and THERAPY PLAN:   Malignant neoplasm of upper-outer quadrant of right breast in female, estrogen receptor negative (HCC) This is a 57 year old woman with history of stage IIa ER/PR negative HER2 positive right breast invasive ductal carcinoma diagnosed in May 2023 status post neoadjuvant chemotherapy, right breast lumpectomy, adjuvant radiation, continuing on Herceptin Perjeta through May 2024.   Treatment Plan: 1. Neoadjuvant chemotherapy with TCH Perjeta 6 cycles followed by Herceptin Perjeta maintenance versus Kadcyla maintenance (based on response to neoadjuvant chemo) for 1 year 2. 11/03/2021: Right lumpectomy: Pathologic complete response, 0/4 lymph nodes, margins negative 3. Followed by adjuvant radiation therapy 12/08/2021-12/25/2021 ------------------------------------------------------------------------------------------------------------------------------- Herceptin/Perjeta toxicities:  At risk for heart failure: Most recent echocardiogram in December 2023, she is okay to treat with this echocardiogram result however we will work to get her next echo expedited. Fatigue:  Unlikely related to Herceptin Perjeta however we will obtain vitamin D, vitamin B12, iron studies, and a hemoglobin A1c with her labs today. Leukopenia: Her total WBC has been as low as 2.7.  Please note for African-American patients the lower limit of normal for white blood cells is 3.5.  Today her white blood cell count is 3.4.  This is stable and she does not have any infections.  We will continue to monitor this.  Thelma Barge will return in 3 weeks for labs, follow-up with Dr. Pamelia Hoit, and her final Herceptin Perjeta.  I will see her back in June 2024 for her survivorship care plan visit.  I educated her on this visit and what it entails.   All questions were answered. The patient knows to call the clinic with any problems,   Total encounter time:30 minutes*in face-to-face visit time, chart review, lab review, care coordination, order entry, and documentation of the encounter time.    Lillard Anes, NP 04/29/22 10:13 AM Medical Oncology and Hematology [facility] [address] [place], [place] [address] Tel. [phone]    Fax. [phone]  *Total Encounter Time as defined by the Centers for Medicare and Medicaid Services includes, in addition to the face-to-face time of a patient visit (documented in the note above) non-face-to-face time: obtaining and reviewing outside history, ordering and reviewing medications, tests or procedures, care coordination (communications with other health care professionals or caregivers) and documentation in the medical record.  This note was electronically signed. Noreene Filbert, NP 04/29/2022

## 2022-04-29 NOTE — Progress Notes (Signed)
RN successfully faxed [facility] mammogram request with expected date June 07, 2022.

## 2022-04-29 NOTE — Progress Notes (Signed)
Order entered per NP to change echo to within one week as opposed to June.

## 2022-04-30 ENCOUNTER — Telehealth: Payer: Self-pay | Admitting: *Deleted

## 2022-04-30 ENCOUNTER — Telehealth: Payer: Self-pay | Admitting: Adult Health

## 2022-04-30 NOTE — Telephone Encounter (Signed)
Scheduled appointment per 4/25 los. Patient is aware of the made appointment. 

## 2022-04-30 NOTE — Telephone Encounter (Addendum)
-----   Message from Loa Socks, NP sent at 04/30/2022  9:29 AM EDT ----- Please let patient know that her labs do not show any deficiencies that would lead to her fatigue.  It is likely still taking some time from her treatment.  Her hemoglobin A1c however is consistent with prediabetes.  I recommend that she follow-up with her primary care provider and limit carbohydrate intake.    Contacted patient with results as directed by provider in message above. Patient verbalized understanding.  Lab results sent electronic fax to PCP, Ms. Donalee Citrin, PA at patient's request.

## 2022-05-01 ENCOUNTER — Other Ambulatory Visit: Payer: Self-pay

## 2022-05-03 ENCOUNTER — Ambulatory Visit: Payer: Federal, State, Local not specified - PPO | Admitting: Physical Therapy

## 2022-05-03 ENCOUNTER — Encounter: Payer: Self-pay | Admitting: Physical Therapy

## 2022-05-03 DIAGNOSIS — R293 Abnormal posture: Secondary | ICD-10-CM

## 2022-05-03 DIAGNOSIS — M25511 Pain in right shoulder: Secondary | ICD-10-CM

## 2022-05-03 DIAGNOSIS — Z171 Estrogen receptor negative status [ER-]: Secondary | ICD-10-CM | POA: Diagnosis not present

## 2022-05-03 DIAGNOSIS — C50411 Malignant neoplasm of upper-outer quadrant of right female breast: Secondary | ICD-10-CM | POA: Diagnosis not present

## 2022-05-03 DIAGNOSIS — M25611 Stiffness of right shoulder, not elsewhere classified: Secondary | ICD-10-CM

## 2022-05-03 DIAGNOSIS — N63 Unspecified lump in unspecified breast: Secondary | ICD-10-CM

## 2022-05-03 NOTE — Therapy (Addendum)
OUTPATIENT PHYSICAL THERAPY ONCOLOGY TREATMENT  Patient Name: Patricia Houston MRN: [id number] DOB:04-20-1965, 57 y.o., female Today's Date: 05/03/2022  END OF SESSION:  PT End of Session - 05/03/22 1607     Visit Number 4    Number of Visits 8    Date for PT Re-Evaluation 05/05/22    PT Start Time 1605    PT Stop Time 1656    PT Time Calculation (min) 51 min    Activity Tolerance Patient tolerated treatment well    Behavior During Therapy WFL for tasks assessed/performed             Past Medical History:  Diagnosis Date   Anemia    TAKES IRON PILLS   Anxiety    Cancer (HCC)    right breast DCIS   Depression    PONV (postoperative nausea and vomiting)    Past Surgical History:  Procedure Laterality Date   ABDOMINAL HYSTERECTOMY     BREAST LUMPECTOMY WITH RADIOACTIVE SEED AND SENTINEL LYMPH NODE BIOPSY Right 11/03/2021   Procedure: RIGHT BREAST BRACKETED LUMPECTOMY WITH RADIOACTIVE SEED AND SENTINEL LYMPH NODE BIOPSY;  Surgeon: Harriette Bouillon, MD;  Location: [facility];  Service: General;  Laterality: Right;   BREAST LUMPECTOMY WITH RADIOACTIVE SEED LOCALIZATION Right 06/14/2018   Procedure: RIGHT BREAST RADIOACTIVE SEED LOCALIZATION LUMPECTOMY X2;  Surgeon: Harriette Bouillon, MD;  Location: [facility];  Service: General;  Laterality: Right;   BREAST LUMPECTOMY WITH RADIOACTIVE SEED LOCALIZATION Left 11/03/2021   Procedure: LEFT BREAST BRACKETED LUMPECTOMY WITH RADIOACTIVE SEED LOCALIZATION;  Surgeon: Harriette Bouillon, MD;  Location: [facility];  Service: General;  Laterality: Left;   IR IMAGING GUIDED PORT INSERTION  06/17/2021   LAPAROSCOPIC ASSISTED VAGINAL HYSTERECTOMY N/A 02/27/2014   Procedure: LAPAROSCOPIC ASSISTED VAGINAL HYSTERECTOMY;  Surgeon: Dorien Chihuahua. Richardson Dopp, MD;  Location: [facility];  Service: Gynecology;  Laterality: N/A;  abdomen to vagina to abdomen   MASS EXCISION Right 02/16/2022   Procedure: EXCISION RIGHT BACK  MASS/CYST;  Surgeon: Harriette Bouillon, MD;  Location:  SURGERY CENTER;  Service: General;  Laterality: Right;  GEN AND LOCAL   TONSILLECTOMY     Patient Active Problem List   Diagnosis Date Noted   Vitamin D deficiency 07/09/2021   Iron deficiency anemia 07/09/2021   Hyperlipidemia 07/09/2021   Port-A-Cath in place 06/18/2021   Malignant neoplasm of upper-outer quadrant of right breast in female, estrogen receptor negative (HCC) 05/27/2021   Atypical ductal hyperplasia of right breast 07/18/2018   Genetic testing 03/31/2018   Family history of breast cancer in mother 04/03/2015   Family history of pancreatic cancer 04/03/2015   S/P hysterectomy 02/27/2014   ANEMIA 07/01/2009     REFERRING PROVIDER: Serena Croissant, MD  REFERRING DIAG: s/p Right Breast Cancer  THERAPY DIAG:  Swelling of breast  Acute pain of right shoulder  Stiffness of right shoulder, not elsewhere classified  Abnormal posture  Malignant neoplasm of upper-outer quadrant of right breast in female, estrogen receptor negative (HCC)  ONSET DATE: 11/04/2022  Rationale for Evaluation and Treatment: Rehabilitation  SUBJECTIVE:  SUBJECTIVE STATEMENT: I did not have any sharp pains today when I would reach with my arm. Last session helped.   PERTINENT HISTORY:   Breast cancer recurrence noted 05/19/21. Hx of Rt lumpectomy  in 2020 with Dr. Luisa Hart.  0/1 lymph node removed. Treatment was neoadjuvant chemotherapy TCHP followed by right and left lumpectomies and right SLNB  with 0+/4 LN on 10/31//2023 and radiation which ended on 12/25/2021    PAIN:  Are you having pain? No  PRECAUTIONS: Right UE lymphedema risk  WEIGHT BEARING RESTRICTIONS: No  FALLS:  Has patient fallen in last 6 months? No  LIVING  ENVIRONMENT: Lives with: lives with their spouse Lives in: House/apartment Stairs: Yes; Internal: 1 flight steps; on right going up and External: 3 steps; none Has following equipment at home: None  OCCUPATION: coordinator mostly computer  LEISURE: walking, riding peleton, reading  HAND DOMINANCE: left   PRIOR LEVEL OF FUNCTION: Independent  PATIENT GOALS: relieve tightness/soreness, be able to wash middle of back, lay on right side again.   OBJECTIVE:  COGNITION: Overall cognitive status: Within functional limits for tasks assessed   PALPATION: Tender right sternal and axillary border of pectorals  OBSERVATIONS / OTHER ASSESSMENTS: right breast mildly drawn up from radiation, skin thickening. Fibrosis noted medial to breast incision, mild generalized swelling.  SENSATION: Light touch: Deficits     POSTURE: forward head, rounded shoulders  UPPER EXTREMITY AROM/PROM:  A/PROM RIGHT   eval   Shoulder extension 47 tight  Shoulder flexion 140  Shoulder abduction 155  Shoulder internal rotation 32  Shoulder external rotation 99    (Blank rows = not tested)  A/PROM LEFT   eval  Shoulder extension 63  Shoulder flexion 162  Shoulder abduction 178  Shoulder internal rotation 67  Shoulder external rotation 90    (Blank rows = not tested)  CERVICAL AROM: All within functional limits:   RECENT SOZO in March WNL   UPPER EXTREMITY STRENGTH: WFL  LYMPHEDEMA ASSESSMENTS:   SURGERY TYPE/DATE: 11/03/2021 Right and left lumpectomies with Right SLNB, right Lumpectomy 2020 with SLNB  NUMBER OF LYMPH NODES REMOVED: total of 5  CHEMOTHERAPY: Yes  RADIATION:YES  HORMONE TREATMENT: NO  INFECTIONS: NO  LYMPHEDEMA ASSESSMENTS:   LANDMARK RIGHT  eval  10 cm proximal to olecranon process   Olecranon process   10 cm proximal to ulnar styloid process   Just proximal to ulnar styloid process   Across hand at thumb web space   At base of 2nd digit   (Blank rows =  not tested)  LANDMARK LEFT  eval  10 cm proximal to olecranon process   Olecranon process   10 cm proximal to ulnar styloid process   Just proximal to ulnar styloid process   Across hand at thumb web space   At base of 2nd digit   (Blank rows = not tested)     QUICK DASH SURVEY: 16% BREAST COMPLAINTS QUESTIONNAIRE Pain 6 Heaviness:3 Swollen feeling:6 Tense Skin:2 Redness:2 Bra Print:0 Size of Pores:0 Hard feeling: 8 Total:  27   /80 A Score over 9 indicates lymphedema issues in the breast    TODAY'S TREATMENT:  DATE:  05/03/22: In supine: Short neck, 5 diaphragmatic breaths, L axillary nodes and establishment of interaxillary pathway, R inguinal nodes and establishment of axilloinguinal pathway, then R breast moving fluid towards pathways spending extra time in any areas of fibrosis then retracing all steps. Had pt return demonstrate each step and provided verbal and tactile cues for correct skin stretch, direction of stretch, speed and sequence. Issued handout for pt to practice at home. Instructed pt in childs pose with trunk stretch and snow angels in supine for pec stretch with pt feeling increased tightness with both of these stretches  04/28/22: In supine: Short neck, 5 diaphragmatic breaths, R axillary nodes and establishment of interaxillary pathway, L inguinal nodes and establishment of axilloinguinal pathway, then L breast moving fluid towards pathways spending extra time in any areas of fibrosis then retracing all steps. Instructed pt throughout in correct skin stretch technique, anatomy and physiology of the lymphatic system and sequence.    04/26/22: Educated pt in doorway stretch for pecs with pt feeling a good stretch with this - educated her to hold for 30 sec. Also instructed pt and had her return demo supine with arms in goal  post position and knees to L side for R lateral trunk stretch with 30 sec holds with pt also feeling good stretch with this. STM to areas of tightness using cocoa butter in area of inferior breast, lumpectomy scar and R pec where scar tissue and muscle tissue was palpable with noticeable improvement in texture by end of session.   Eval: Educated pt in supine wand exs for flexion and scaption, supine star gazer and wall slides for abduction all x 4-5 reps.Pt demonstrated good improvement in ROM after exs. Discussed POC to address tight muscles, and breast swelling  PATIENT EDUCATION:  Education details: as above Person educated: Patient Education method: Programmer, multimedia, Demonstration, and Handouts Education comprehension: verbalized understanding and returned demonstration  HOME EXERCISE PROGRAM: Supine wand flexion and scaption, supine star gazer, standing wall slides LTR with arms in goal post Doorway stretch Access Code: [email or web address] URL: [email or web address] Date: 05/03/2022 Prepared by: Leonette Most  Exercises - Starlyn Skeans  - 1 x daily - 7 x weekly - 1 sets - 10 reps - can hold where tight for 30 sec hold - Child's Pose with Sidebending  - 1 x daily - 7 x weekly - 1 sets - 5 reps - 30 sec hold  ASSESSMENT:  CLINICAL IMPRESSION: Instructed pt in self MLD today and had her return demonstrate while providing appropriate v/c and t/c. Issued handout for pt to practice at home. Instructed pt in new HEP with snow angels and child's pose walking fingers to side for lat stretch. Will assess independence with self MLD at next session.   OBJECTIVE IMPAIRMENTS: decreased knowledge of condition, decreased ROM, increased edema, and impaired UE functional use.   ACTIVITY LIMITATIONS: sleeping, dressing, and reach over head  PARTICIPATION LIMITATIONS:  pt is doing all that is required of her  PERSONAL FACTORS: 1-2 comorbidities: bilateral breast lumpectomies with chemo and   right radiation  are also affecting patient's functional outcome.   REHAB POTENTIAL: Excellent  CLINICAL DECISION MAKING: Stable/uncomplicated  EVALUATION COMPLEXITY: Low  GOALS: Goals reviewed with patient? Yes  SHORT TERM GOALS= LONG TERM GOALS: Target date: 05/05/2022  Pt will be independent in MLD to reduce Right breast swelling Baseline: Goal status: INITIAL  2.  Pt will be compliant with compression bra or sports bra to reduce right breast swelling Baseline:  Goal status: INITIAL  3.  Pt will have right shoulder ROM WNL to resume usual activities without limitation Baseline:  Goal status: INITIAL  4.  Pt will be able to sleep on right side without complaint Baseline:  Goal status: INITIAL  5.  Pt will be able to fasten bra behind back with improved ease Baseline:  Goal status: INITIAL  6.  Breast complaints survey will be no greater than 9 Baseline:  Goal status: INITIAL    PLAN:  PT FREQUENCY: 1-2x/week PT DURATION: 4 weeks  PLANNED INTERVENTIONS: Therapeutic exercises, Therapeutic activity, Neuromuscular re-education, Patient/Family education, Self Care, Joint mobilization, Orthotic/Fit training, Dry Needling, Manual lymph drainage, scar mobilization, Vasopneumatic device, Manual therapy, and Re-evaluation  PLAN FOR NEXT SESSION:  how were new exercises? Review HEP prn. Add standing lat stretch, how are stretches given? Add to print out, STM to right [place], [place], lateral trunk, PROM prn, MLD to right breast and instruct pt.   [address] [place], [place] 05/03/2022, 5:02 PM

## 2022-05-03 NOTE — Patient Instructions (Signed)
Self manual lymph drainage: Perform this sequence once a day.  Only give enough pressure no your skin to make the skin move.  Diaphragmatic - Supine   Inhale through nose making navel move out toward hands. Exhale through puckered lips, hands follow navel in. Repeat _5__ times. Rest _10__ seconds between repeats.   Copyright  VHI. All rights reserved.  Hug yourself.  Do circles at your neck just above your collarbones.  Repeat this 10 times.  Axilla - One at a Time   Using full weight of flat hand and fingers at center of uninvolved armpit, make _10__ in-place circles.   Copyright  VHI. All rights reserved.  LEG: Inguinal Nodes Stimulation   With small finger side of hand against hip crease on involved side, gently perform circles at the crease. Repeat __10_ times.   Copyright  VHI. All rights reserved.  Axilla to Inguinal Nodes - Sweep   On involved side, stretch skin _4__ times from armpit along side of trunk to hip crease.  Now gently stretch skin from the involved side to the uninvolved side across the chest at the shoulder line.  Repeat that 4 times.  Draw an imaginary diagonal line from upper outer breast through the nipple area toward lower inner breast.  Direct fluid upward and inward from this line toward the pathway across your upper chest .  Do this in three rows to treat all of the upper inner breast tissue, and do each row 3-4x.      Direct fluid to treat all of lower outer breast tissue downward and outward toward  pathway that is aimed at the right groin.  Finish by doing the pathways as described above going from your involved armpit to the same side groin and going across your upper chest from the involved shoulder to the uninvolved shoulder.  Repeat the steps above where you do circles in your right groin and left armpit. Copyright  VHI. All rights reserved.   

## 2022-05-05 ENCOUNTER — Ambulatory Visit: Payer: Self-pay | Admitting: Surgery

## 2022-05-06 ENCOUNTER — Ambulatory Visit: Payer: Federal, State, Local not specified - PPO | Attending: Hematology and Oncology

## 2022-05-06 DIAGNOSIS — C50411 Malignant neoplasm of upper-outer quadrant of right female breast: Secondary | ICD-10-CM | POA: Diagnosis not present

## 2022-05-06 DIAGNOSIS — M25611 Stiffness of right shoulder, not elsewhere classified: Secondary | ICD-10-CM

## 2022-05-06 DIAGNOSIS — N63 Unspecified lump in unspecified breast: Secondary | ICD-10-CM

## 2022-05-06 DIAGNOSIS — M25511 Pain in right shoulder: Secondary | ICD-10-CM | POA: Diagnosis not present

## 2022-05-06 DIAGNOSIS — R293 Abnormal posture: Secondary | ICD-10-CM | POA: Insufficient documentation

## 2022-05-06 DIAGNOSIS — Z171 Estrogen receptor negative status [ER-]: Secondary | ICD-10-CM | POA: Diagnosis not present

## 2022-05-06 NOTE — Therapy (Addendum)
OUTPATIENT PHYSICAL THERAPY ONCOLOGY TREATMENT  Patient Name: Patricia Houston MRN: [id number] DOB:Nov 29, 1965, 57 y.o., female Today's Date: 05/06/2022  END OF SESSION:  PT End of Session - 05/06/22 1601     Visit Number 5    Number of Visits 11    Date for PT Re-Evaluation 05/27/22    PT Start Time 1603    PT Stop Time 1701    PT Time Calculation (min) 58 min    Activity Tolerance Patient tolerated treatment well    Behavior During Therapy WFL for tasks assessed/performed             Past Medical History:  Diagnosis Date   Anemia    TAKES IRON PILLS   Anxiety    Cancer (HCC)    right breast DCIS   Depression    PONV (postoperative nausea and vomiting)    Past Surgical History:  Procedure Laterality Date   ABDOMINAL HYSTERECTOMY     BREAST LUMPECTOMY WITH RADIOACTIVE SEED AND SENTINEL LYMPH NODE BIOPSY Right 11/03/2021   Procedure: RIGHT BREAST BRACKETED LUMPECTOMY WITH RADIOACTIVE SEED AND SENTINEL LYMPH NODE BIOPSY;  Surgeon: Harriette Bouillon, MD;  Location: [facility];  Service: General;  Laterality: Right;   BREAST LUMPECTOMY WITH RADIOACTIVE SEED LOCALIZATION Right 06/14/2018   Procedure: RIGHT BREAST RADIOACTIVE SEED LOCALIZATION LUMPECTOMY X2;  Surgeon: Harriette Bouillon, MD;  Location: [facility];  Service: General;  Laterality: Right;   BREAST LUMPECTOMY WITH RADIOACTIVE SEED LOCALIZATION Left 11/03/2021   Procedure: LEFT BREAST BRACKETED LUMPECTOMY WITH RADIOACTIVE SEED LOCALIZATION;  Surgeon: Harriette Bouillon, MD;  Location: [facility];  Service: General;  Laterality: Left;   IR IMAGING GUIDED PORT INSERTION  06/17/2021   LAPAROSCOPIC ASSISTED VAGINAL HYSTERECTOMY N/A 02/27/2014   Procedure: LAPAROSCOPIC ASSISTED VAGINAL HYSTERECTOMY;  Surgeon: Dorien Chihuahua. Richardson Dopp, MD;  Location: [facility];  Service: Gynecology;  Laterality: N/A;  abdomen to vagina to abdomen   MASS EXCISION Right 02/16/2022   Procedure: EXCISION RIGHT BACK  MASS/CYST;  Surgeon: Harriette Bouillon, MD;  Location: [facility];  Service: General;  Laterality: Right;  GEN AND LOCAL   TONSILLECTOMY     Patient Active Problem List   Diagnosis Date Noted   Vitamin D deficiency 07/09/2021   Iron deficiency anemia 07/09/2021   Hyperlipidemia 07/09/2021   Port-A-Cath in place 06/18/2021   Malignant neoplasm of upper-outer quadrant of right breast in female, estrogen receptor negative (HCC) 05/27/2021   Atypical ductal hyperplasia of right breast 07/18/2018   Genetic testing 03/31/2018   Family history of breast cancer in mother 04/03/2015   Family history of pancreatic cancer 04/03/2015   S/P hysterectomy 02/27/2014   ANEMIA 07/01/2009     REFERRING PROVIDER: Serena Croissant, MD  REFERRING DIAG: s/p Right Breast Cancer  THERAPY DIAG:  Swelling of breast  Acute pain of right shoulder  Stiffness of right shoulder, not elsewhere classified  Abnormal posture  Malignant neoplasm of upper-outer quadrant of right breast in female, estrogen receptor negative (HCC)  ONSET DATE: 11/04/2022  Rationale for Evaluation and Treatment: Rehabilitation  SUBJECTIVE:  SUBJECTIVE STATEMENT The tightness I was having is greatly improved. ROM is getting better overall. I can reach better and put away dishes. My right breast is still swollen, but I am wearing the foam pad with my compression bra. I wear the compression bra about every day until I get home, and then I put on a regular bra or Cami. I have been trying the MLD at home and feel Like it is going pretty well. I can sleep on my right side now but not entirely. Pt can fasten bra and wash her back better.  PERTINENT HISTORY:   Breast cancer recurrence noted 05/19/21. Hx of Rt lumpectomy  in 2020 with Dr. Luisa Hart.   0/1 lymph node removed. Treatment was neoadjuvant chemotherapy TCHP followed by right and left lumpectomies and right SLNB  with 0+/4 LN on 10/31//2023 and radiation which ended on 12/25/2021    PAIN:  Are you having pain? No  PRECAUTIONS: Right UE lymphedema risk  WEIGHT BEARING RESTRICTIONS: No  FALLS:  Has patient fallen in last 6 months? No  LIVING ENVIRONMENT: Lives with: lives with their spouse Lives in: House/apartment Stairs: Yes; Internal: 1 flight steps; on right going up and External: 3 steps; none Has following equipment at home: None  OCCUPATION: coordinator mostly computer  LEISURE: walking, riding peleton, reading  HAND DOMINANCE: left   PRIOR LEVEL OF FUNCTION: Independent  PATIENT GOALS: relieve tightness/soreness, be able to wash middle of back, lay on right side again.   OBJECTIVE:  COGNITION: Overall cognitive status: Within functional limits for tasks assessed   PALPATION: Tender right sternal and axillary border of pectorals  OBSERVATIONS / OTHER ASSESSMENTS: right breast mildly drawn up from radiation, skin thickening. Fibrosis noted medial to breast incision, mild generalized swelling.  SENSATION: Light touch: Deficits     POSTURE: forward head, rounded shoulders  UPPER EXTREMITY AROM/PROM:  A/PROM RIGHT   eval  RIGHT 05/06/2022  Shoulder extension 47 tight 59  Shoulder flexion 140 165  Shoulder abduction 155 178  Shoulder internal rotation 32 60  Shoulder external rotation 99 105    (Blank rows = not tested)  A/PROM LEFT   eval LEFT 05/06/2022  Shoulder extension 63 60  Shoulder flexion 162 165  Shoulder abduction 178 178  Shoulder internal rotation 67 65  Shoulder external rotation 90 103    (Blank rows = not tested)  CERVICAL AROM: All within functional limits:   RECENT SOZO in March WNL   UPPER EXTREMITY STRENGTH: WFL  LYMPHEDEMA ASSESSMENTS:   SURGERY TYPE/DATE: 11/03/2021 Right and left lumpectomies with Right  SLNB, right Lumpectomy 2020 with SLNB  NUMBER OF LYMPH NODES REMOVED: total of 5  CHEMOTHERAPY: Yes  RADIATION:YES  HORMONE TREATMENT: NO  INFECTIONS: NO  LYMPHEDEMA ASSESSMENTS:   LANDMARK RIGHT  eval  10 cm proximal to olecranon process   Olecranon process   10 cm proximal to ulnar styloid process   Just proximal to ulnar styloid process   Across hand at thumb web space   At base of 2nd digit   (Blank rows = not tested)  LANDMARK LEFT  eval  10 cm proximal to olecranon process   Olecranon process   10 cm proximal to ulnar styloid process   Just proximal to ulnar styloid process   Across hand at thumb web space   At base of 2nd digit   (Blank rows = not tested)     QUICK DASH SURVEY: 16% BREAST COMPLAINTS QUESTIONNAIRE Pain 6 Heaviness:3 Swollen feeling:6  Tense Skin:2 Redness:2 Bra Print:0 Size of Pores:0 Hard feeling: 8 Total:  27   /80 A Score over 9 indicates lymphedema issues in the breast    TODAY'S TREATMENT:                                                                                                                                         DATE:   05/09/2022 Wall slides flexion and abd B x 5 ea Measured AROM Bilaterally Reviewed MLD with therapist showing wall pictures and explaining why technique is so gentle. Therapist read instructions for pt to perform. In supine head of table elevated: Short neck, 5 diaphragmatic breaths, L axillary nodes and establishment of interaxillary pathway, R inguinal nodes and establishment of axilloinguinal pathway, then R breast moving fluid towards pathways spending extra time in any areas of fibrosis then retracing all steps. Had pt return perform each step as instructions were read by PT and provided verbal and tactile cues prn for correct skin stretch, direction of stretch, speed and sequence.   05/03/22: In supine: Short neck, 5 diaphragmatic breaths, R axillary nodes and establishment of interaxillary pathway,  L inguinal nodes and establishment of axilloinguinal pathway, then L breast moving fluid towards pathways spending extra time in any areas of fibrosis then retracing all steps. Had pt return demonstrate each step and provided verbal and tactile cues for correct skin stretch, direction of stretch, speed and sequence. Issued handout for pt to practice at home. Instructed pt in childs pose with trunk stretch and snow angels in supine for pec stretch with pt feeling increased tightness with both of these stretches  04/28/22: In supine: Short neck, 5 diaphragmatic breaths, R axillary nodes and establishment of interaxillary pathway, L inguinal nodes and establishment of axilloinguinal pathway, then L breast moving fluid towards pathways spending extra time in any areas of fibrosis then retracing all steps. Instructed pt throughout in correct skin stretch technique, anatomy and physiology of the lymphatic system and sequence.    04/26/22: Educated pt in doorway stretch for pecs with pt feeling a good stretch with this - educated her to hold for 30 sec. Also instructed pt and had her return demo supine with arms in goal post position and knees to L side for R lateral trunk stretch with 30 sec holds with pt also feeling good stretch with this. STM to areas of tightness using cocoa butter in area of inferior breast, lumpectomy scar and R pec where scar tissue and muscle tissue was palpable with noticeable improvement in texture by end of session.   Eval: Educated pt in supine wand exs for flexion and scaption, supine star gazer and wall slides for abduction all x 4-5 reps.Pt demonstrated good improvement in ROM after exs. Discussed POC to address tight muscles, and breast swelling  PATIENT EDUCATION:  Education details: as above Person educated: Patient Education method: Explanation, Demonstration, and Handouts  Education comprehension: verbalized understanding and returned demonstration  HOME EXERCISE  PROGRAM: Supine wand flexion and scaption, supine star gazer, standing wall slides LTR with arms in goal post Doorway stretch Access Code: [email or web address] URL: [email or web address] Date: 05/03/2022 Prepared by: Leonette Most  Exercises - Starlyn Skeans  - 1 x daily - 7 x weekly - 1 sets - 10 reps - can hold where tight for 30 sec hold - Child's Pose with Sidebending  - 1 x daily - 7 x weekly - 1 sets - 5 reps - 30 sec hold  ASSESSMENT:  CLINICAL IMPRESSION: Pt has fully met 3 goals and has partially or nearly met 3 goals. Her AROM has now returned to normal, and she is sleeping on her right side now, but not quite normally yet. She did an excellent job return demonstrating MLD today, but requires occasional VC's. She has been compliant with compression bra during the daytime, but usually switches to a cami or lighter bra in the evening. She will try to use her sports bra or compression bra more in the evening. We will decrease to 1x per week with appts prn. She is making excellent progress but will benefit from several more visits to fully meet goals and be comfortable with her self MLD  OBJECTIVE IMPAIRMENTS: decreased knowledge of condition, decreased ROM, increased edema, and impaired UE functional use.   ACTIVITY LIMITATIONS: sleeping, dressing, and reach over head  PARTICIPATION LIMITATIONS:  pt is doing all that is required of her  PERSONAL FACTORS: 1-2 comorbidities: bilateral breast lumpectomies with chemo and  right radiation  are also affecting patient's functional outcome.   REHAB POTENTIAL: Excellent  CLINICAL DECISION MAKING: Stable/uncomplicated  EVALUATION COMPLEXITY: Low  GOALS: Goals reviewed with patient? Yes  SHORT TERM GOALS= LONG TERM GOALS: Target date: 05/05/2022  Pt will be independent in MLD to reduce Right breast swelling Baseline: Goal status: ONGOING, doing well but needs review  2.  Pt will be compliant with compression bra or sports  bra to reduce right breast swelling Baseline:  Goal status: MET for daytime use.  3.  Pt will have right shoulder ROM WNL to resume usual activities without limitation Baseline:  Goal status: MET 05/06/2022 4.  Pt will be able to sleep on right side without complaint Baseline:  Goal status: improving but not yet normal  5.  Pt will be able to fasten bra behind back with improved ease Baseline:  Goal status: MET 05/06/2022  6.  Breast complaints survey will be no greater than 9 Baseline:  Goal status: ONGOING    PLAN:  PT FREQUENCY: 1-2x/week PT DURATION: 3 weeks  PLANNED INTERVENTIONS: Therapeutic exercises, Therapeutic activity, Neuromuscular re-education, Patient/Family education, Self Care, Joint mobilization, Orthotic/Fit training, Dry Needling, Manual lymph drainage, scar mobilization, Vasopneumatic device, Manual therapy, and Re-evaluation  PLAN FOR NEXT SESSION:  how were new exercises? Review HEP prn. ,  Add to print out, STM to right [place], [place], lateral trunk prn, PROM prn, MLD to right breast and instruct pt., progress to strength    Waynette Buttery, PT 05/06/2022, 5:18 PM

## 2022-05-11 ENCOUNTER — Ambulatory Visit: Payer: Federal, State, Local not specified - PPO

## 2022-05-11 DIAGNOSIS — Z171 Estrogen receptor negative status [ER-]: Secondary | ICD-10-CM | POA: Diagnosis not present

## 2022-05-11 DIAGNOSIS — C50411 Malignant neoplasm of upper-outer quadrant of right female breast: Secondary | ICD-10-CM | POA: Diagnosis not present

## 2022-05-11 DIAGNOSIS — M25611 Stiffness of right shoulder, not elsewhere classified: Secondary | ICD-10-CM | POA: Diagnosis not present

## 2022-05-11 DIAGNOSIS — M25511 Pain in right shoulder: Secondary | ICD-10-CM

## 2022-05-11 DIAGNOSIS — R293 Abnormal posture: Secondary | ICD-10-CM

## 2022-05-11 DIAGNOSIS — N63 Unspecified lump in unspecified breast: Secondary | ICD-10-CM

## 2022-05-11 NOTE — Therapy (Signed)
OUTPATIENT PHYSICAL THERAPY ONCOLOGY TREATMENT  Patient Name: Patricia Houston MRN: [id number] DOB:[birth year], 57 y.o., female Today's Date: 05/11/2022  END OF SESSION:  PT End of Session - 05/11/22 1601     Visit Number 6    Number of Visits 11    Date for PT Re-Evaluation 05/27/22    PT Start Time 1603    Activity Tolerance Patient tolerated treatment well    Behavior During Therapy [facility] for tasks assessed/performed             Past Medical History:  Diagnosis Date   Anemia    TAKES IRON PILLS   Anxiety    Cancer (HCC)    right breast DCIS   Depression    PONV (postoperative nausea and vomiting)    Past Surgical History:  Procedure Laterality Date   ABDOMINAL HYSTERECTOMY     BREAST LUMPECTOMY WITH RADIOACTIVE SEED AND SENTINEL LYMPH NODE BIOPSY Right 11/03/2021   Procedure: RIGHT BREAST BRACKETED LUMPECTOMY WITH RADIOACTIVE SEED AND SENTINEL LYMPH NODE BIOPSY;  Surgeon: Harriette Bouillon, MD;  Location: [facility];  Service: General;  Laterality: Right;   BREAST LUMPECTOMY WITH RADIOACTIVE SEED LOCALIZATION Right 06/14/2018   Procedure: RIGHT BREAST RADIOACTIVE SEED LOCALIZATION LUMPECTOMY X2;  Surgeon: Harriette Bouillon, MD;  Location: [facility];  Service: General;  Laterality: Right;   BREAST LUMPECTOMY WITH RADIOACTIVE SEED LOCALIZATION Left 11/03/2021   Procedure: LEFT BREAST BRACKETED LUMPECTOMY WITH RADIOACTIVE SEED LOCALIZATION;  Surgeon: Harriette Bouillon, MD;  Location: [facility];  Service: General;  Laterality: Left;   IR IMAGING GUIDED PORT INSERTION  06/17/2021   LAPAROSCOPIC ASSISTED VAGINAL HYSTERECTOMY N/A 02/27/2014   Procedure: LAPAROSCOPIC ASSISTED VAGINAL HYSTERECTOMY;  Surgeon: Dorien Chihuahua. Richardson Dopp, MD;  Location: [facility];  Service: Gynecology;  Laterality: N/A;  abdomen to vagina to abdomen   MASS EXCISION Right 02/16/2022   Procedure: EXCISION RIGHT BACK MASS/CYST;  Surgeon: Harriette Bouillon, MD;  Location: [facility];  Service: General;  Laterality: Right;  GEN AND LOCAL   TONSILLECTOMY     Patient Active Problem List   Diagnosis Date Noted   Vitamin D deficiency 07/09/2021   Iron deficiency anemia 07/09/2021   Hyperlipidemia 07/09/2021   Port-A-Cath in place 06/18/2021   Malignant neoplasm of upper-outer quadrant of right breast in female, estrogen receptor negative (HCC) 05/27/2021   Atypical ductal hyperplasia of right breast 07/18/2018   Genetic testing 03/31/2018   Family history of breast cancer in mother 04/03/2015   Family history of pancreatic cancer 04/03/2015   S/P hysterectomy 02/27/2014   ANEMIA 07/01/2009     REFERRING PROVIDER: Serena Croissant, MD  REFERRING DIAG: s/p Right Breast Cancer  THERAPY DIAG:  Swelling of breast  Acute pain of right shoulder  Stiffness of right shoulder, not elsewhere classified  Abnormal posture  ONSET DATE: 11/04/2022  Rationale for Evaluation and Treatment: Rehabilitation  SUBJECTIVE:  SUBJECTIVE STATEMENT  ROM of the shoulder is doing pretty well.. I put the foam back in the bra at the armpit region because it was getting irritated.  I haven't paid attention to the breast swelling. Doing better sleeping on the right side. I have slept in the compression bra for the last couple of nights.   PERTINENT HISTORY:   Breast cancer recurrence noted 05/19/21. Hx of Rt lumpectomy  in 2020 with Dr. Luisa Hart.  0/1 lymph node removed. Treatment was neoadjuvant chemotherapy TCHP followed by right and left lumpectomies and right SLNB  with 0+/4 LN on 10/31//2023 and radiation which ended on 12/25/2021    PAIN:  Are you having pain? No  PRECAUTIONS: Right UE lymphedema risk  WEIGHT BEARING RESTRICTIONS: No  FALLS:  Has patient fallen in last 6 months?  No  LIVING ENVIRONMENT: Lives with: lives with their spouse Lives in: House/apartment Stairs: Yes; Internal: 1 flight steps; on right going up and External: 3 steps; none Has following equipment at home: None  OCCUPATION: coordinator mostly computer  LEISURE: walking, riding peleton, reading  HAND DOMINANCE: left   PRIOR LEVEL OF FUNCTION: Independent  PATIENT GOALS: relieve tightness/soreness, be able to wash middle of back, lay on right side again.   OBJECTIVE:  COGNITION: Overall cognitive status: Within functional limits for tasks assessed   PALPATION: Tender right sternal and axillary border of pectorals  OBSERVATIONS / OTHER ASSESSMENTS: right breast mildly drawn up from radiation, skin thickening. Fibrosis noted medial to breast incision, mild generalized swelling.  SENSATION: Light touch: Deficits     POSTURE: forward head, rounded shoulders  UPPER EXTREMITY AROM/PROM:  A/PROM RIGHT   eval  RIGHT 05/06/2022  Shoulder extension 47 tight 59  Shoulder flexion 140 165  Shoulder abduction 155 178  Shoulder internal rotation 32 60  Shoulder external rotation 99 105    (Blank rows = not tested)  A/PROM LEFT   eval LEFT 05/06/2022  Shoulder extension 63 60  Shoulder flexion 162 165  Shoulder abduction 178 178  Shoulder internal rotation 67 65  Shoulder external rotation 90 103    (Blank rows = not tested)  CERVICAL AROM: All within functional limits:   RECENT SOZO in March WNL   UPPER EXTREMITY STRENGTH: WFL  LYMPHEDEMA ASSESSMENTS:   SURGERY TYPE/DATE: 11/03/2021 Right and left lumpectomies with Right SLNB, right Lumpectomy 2020 with SLNB  NUMBER OF LYMPH NODES REMOVED: total of 5  CHEMOTHERAPY: Yes  RADIATION:YES  HORMONE TREATMENT: NO  INFECTIONS: NO  LYMPHEDEMA ASSESSMENTS:   LANDMARK RIGHT  eval  10 cm proximal to olecranon process   Olecranon process   10 cm proximal to ulnar styloid process   Just proximal to ulnar styloid  process   Across hand at thumb web space   At base of 2nd digit   (Blank rows = not tested)  LANDMARK LEFT  eval  10 cm proximal to olecranon process   Olecranon process   10 cm proximal to ulnar styloid process   Just proximal to ulnar styloid process   Across hand at thumb web space   At base of 2nd digit   (Blank rows = not tested)     QUICK DASH SURVEY: 16% BREAST COMPLAINTS QUESTIONNAIRE Pain 6 Heaviness:3 Swollen feeling:6 Tense Skin:2 Redness:2 Bra Print:0 Size of Pores:0 Hard feeling: 8 Total:  27   /80 A Score over 9 indicates lymphedema issues in the breast    TODAY'S TREATMENT:  DATE:  05/11/2022 STM right pectorals, lateral trunk and UT with cocoa butter PROM right shoulder flexion, scaption, abduction, ER Reviewed MLD with PT verbalizing and pt performing all steps. In supine head of table elevated: Short neck, 5 diaphragmatic breaths, L axillary nodes and establishment of interaxillary pathway, R inguinal nodes and establishment of axilloinguinal pathway, then R breast moving fluid towards pathways spending extra time in any areas of fibrosis then retracing all steps.  Occasional VC's, TC's for proper hand position but otherwise doing very well. Small gray foam pad made for area of fibrosis at breast incision area.  05/09/2022 Wall slides flexion and abd B x 5 ea Measured AROM Bilaterally Reviewed MLD with therapist showing wall pictures and explaining why technique is so gentle. Therapist read instructions for pt to perform. In supine head of table elevated: Short neck, 5 diaphragmatic breaths, L axillary nodes and establishment of interaxillary pathway, R inguinal nodes and establishment of axilloinguinal pathway, then R breast moving fluid towards pathways spending extra time in any areas of fibrosis then retracing all steps.  Had pt return perform each step as instructions were read by PT and provided verbal and tactile cues prn for correct skin stretch, direction of stretch, speed and sequence.   05/03/22: In supine: Short neck, 5 diaphragmatic breaths, R axillary nodes and establishment of interaxillary pathway, L inguinal nodes and establishment of axilloinguinal pathway, then L breast moving fluid towards pathways spending extra time in any areas of fibrosis then retracing all steps. Had pt return demonstrate each step and provided verbal and tactile cues for correct skin stretch, direction of stretch, speed and sequence. Issued handout for pt to practice at home. Instructed pt in childs pose with trunk stretch and snow angels in supine for pec stretch with pt feeling increased tightness with both of these stretches  04/28/22: In supine: Short neck, 5 diaphragmatic breaths, R axillary nodes and establishment of interaxillary pathway, L inguinal nodes and establishment of axilloinguinal pathway, then L breast moving fluid towards pathways spending extra time in any areas of fibrosis then retracing all steps. Instructed pt throughout in correct skin stretch technique, anatomy and physiology of the lymphatic system and sequence.    04/26/22: Educated pt in doorway stretch for pecs with pt feeling a good stretch with this - educated her to hold for 30 sec. Also instructed pt and had her return demo supine with arms in goal post position and knees to L side for R lateral trunk stretch with 30 sec holds with pt also feeling good stretch with this. STM to areas of tightness using cocoa butter in area of inferior breast, lumpectomy scar and R pec where scar tissue and muscle tissue was palpable with noticeable improvement in texture by end of session.   Eval: Educated pt in supine wand exs for flexion and scaption, supine star gazer and wall slides for abduction all x 4-5 reps.Pt demonstrated good improvement in ROM after exs.  Discussed POC to address tight muscles, and breast swelling  PATIENT EDUCATION:  Education details: as above Person educated: Patient Education method: Programmer, multimedia, Demonstration, and Handouts Education comprehension: verbalized understanding and returned demonstration  HOME EXERCISE PROGRAM: Supine wand flexion and scaption, supine star gazer, standing wall slides LTR with arms in goal post Doorway stretch Access Code: [email or web address] URL: [email or web address] Date: 05/03/2022 Prepared by: Leonette Most  Exercises - Starlyn Skeans  - 1 x daily - 7 x weekly - 1 sets - 10 reps - can hold where tight  for 30 sec hold - Child's Pose with Sidebending  - 1 x daily - 7 x weekly - 1 sets - 5 reps - 30 sec hold  ASSESSMENT:  CLINICAL IMPRESSION: Mild tightness noted in right pectorals and lateral trunk with STM but minimal tenderness. Breast swelling appears improved and pt has been compliant with compression bra and MLD. Foam pad made to soften area of fibrosis. Pt may be ready for DC next week.  OBJECTIVE IMPAIRMENTS: decreased knowledge of condition, decreased ROM, increased edema, and impaired UE functional use.   ACTIVITY LIMITATIONS: sleeping, dressing, and reach over head  PARTICIPATION LIMITATIONS:  pt is doing all that is required of her  PERSONAL FACTORS: 1-2 comorbidities: bilateral breast lumpectomies with chemo and  right radiation  are also affecting patient's functional outcome.   REHAB POTENTIAL: Excellent  CLINICAL DECISION MAKING: Stable/uncomplicated  EVALUATION COMPLEXITY: Low  GOALS: Goals reviewed with patient? Yes  SHORT TERM GOALS= LONG TERM GOALS: Target date: 05/05/2022  Pt will be independent in MLD to reduce Right breast swelling Baseline: Goal status: ONGOING, doing well but needs review  2.  Pt will be compliant with compression bra or sports bra to reduce right breast swelling Baseline:  Goal status: MET for daytime use, wearing more  frequently at night also  3.  Pt will have right shoulder ROM WNL to resume usual activities without limitation Baseline:  Goal status: MET 05/06/2022 4.  Pt will be able to sleep on right side without complaint Baseline:  Goal status: improving but not yet normal  5.  Pt will be able to fasten bra behind back with improved ease Baseline:  Goal status: MET 05/06/2022  6.  Breast complaints survey will be no greater than 9 Baseline:  Goal status: ONGOING    PLAN:  PT FREQUENCY: 1-2x/week PT DURATION: 3 weeks  PLANNED INTERVENTIONS: Therapeutic exercises, Therapeutic activity, Neuromuscular re-education, Patient/Family education, Self Care, Joint mobilization, Orthotic/Fit training, Dry Needling, Manual lymph drainage, scar mobilization, Vasopneumatic device, Manual therapy, and Re-evaluation  PLAN FOR NEXT SESSION:  Going to [place] in June; does she have a compression sleeve?,Do Breast Complaints survey, add standing TB ,   review MLD to right breast,  update HEP,  check goals,DC next?   Waynette Buttery, PT 05/11/2022, 4:01 PM

## 2022-05-18 ENCOUNTER — Ambulatory Visit (HOSPITAL_COMMUNITY)
Admission: RE | Admit: 2022-05-18 | Discharge: 2022-05-18 | Disposition: A | Discharge status: Home / Self Care | Payer: Federal, State, Local not specified - PPO | Source: Ambulatory Visit | Attending: Adult Health | Admitting: Adult Health

## 2022-05-18 ENCOUNTER — Ambulatory Visit: Payer: Federal, State, Local not specified - PPO

## 2022-05-18 DIAGNOSIS — E785 Hyperlipidemia, unspecified: Secondary | ICD-10-CM | POA: Insufficient documentation

## 2022-05-18 DIAGNOSIS — Z79899 Other long term (current) drug therapy: Secondary | ICD-10-CM | POA: Insufficient documentation

## 2022-05-18 DIAGNOSIS — Z5181 Encounter for therapeutic drug level monitoring: Secondary | ICD-10-CM | POA: Insufficient documentation

## 2022-05-18 LAB — ECHOCARDIOGRAM COMPLETE
AR max vel: 2.34 cm2
AV Area VTI: 2.23 cm2
AV Area mean vel: 2.2 cm2
AV Mean grad: 3 mmHg
AV Peak grad: 5.6 mmHg
Ao pk vel: 1.18 m/s
Area-P 1/2: 4.31 cm2
Calc EF: 61.8 %
MV VTI: 2.34 cm2
S' Lateral: 2.9 cm
Single Plane A2C EF: 61.8 %
Single Plane A4C EF: 62.3 %

## 2022-05-18 NOTE — Progress Notes (Signed)
Patient Care Team: Wilfrid Lund, PA as PCP - General (Family Medicine) Pershing Proud, RN as Oncology Nurse Navigator Donnelly Angelica, RN as Oncology Nurse Navigator Harriette Bouillon, MD as Consulting Physician (General Surgery) Serena Croissant, MD as Consulting Physician (Hematology and Oncology) Lonie Peak, MD as Attending Physician (Radiation Oncology)  DIAGNOSIS: No diagnosis found.  SUMMARY OF ONCOLOGIC HISTORY: Oncology History  Malignant neoplasm of upper-outer quadrant of right breast in female, estrogen receptor negative (HCC)  06/14/2018 Surgery   06/14/2018: Right lumpectomy: Intraductal papilloma with usual ductal hyperplasia, 1 benign lymph node (decided against tamoxifen)   05/19/2021 Relapse/Recurrence   Screening mammogram detected right breast density.  2 lymph nodes were noted biopsy appointment was benign.  Ultrasound revealed 2.2 cm mass at 11 o'clock position right breast biopsy: Grade 3 IDC with necrosis, ER 0%, PR 0%, Ki-67 60%, HER2 3+ positive by [facility]   05/27/2021 Cancer Staging   Staging form: Breast, AJCC 8th Edition - Clinical: Stage IIA (cT2, cN0, cM0, G3, ER-, PR-, HER2+) - Signed by Serena Croissant, MD on 05/27/2021 Stage prefix: Initial diagnosis Histologic grading system: 3 grade system   06/18/2021 - 08/22/2021 Chemotherapy   Patient is on Treatment Plan : BREAST  Docetaxel + Carboplatin + Trastuzumab + Pertuzumab  (TCHP) q21d      06/18/2021 -  Chemotherapy   Patient is on Treatment Plan : BREAST  Docetaxel + Carboplatin + Trastuzumab + Pertuzumab  (TCHP) q21d / Trastuzumab + Pertuzumab q21d     11/03/2021 Surgery   Right lumpectomy: Pathologic complete response, 0/4 lymph nodes, margins negative    12/07/2021 - 12/25/2021 Radiation Therapy   Plan Name: Breast_R Site: Breast, Right Technique: 3D Mode: Photon Dose Per Fraction: 2.67 Gy Prescribed Dose (Delivered / Prescribed): 40.05 Gy / 40.05 Gy Prescribed Fxs (Delivered / Prescribed): 15 / 15        CHIEF COMPLIANT: Herceptin Perjeta maintenance   INTERVAL HISTORY: Patricia Houston is a 57 y.o. female is here because of  diagnosis of right breast cancer. Currently on herceptin and prejeta. She presents to the clinic today for a follow-up.    ALLERGIES:  has No Known Allergies.  MEDICATIONS:  Current Outpatient Medications  Medication Sig Dispense Refill   Cetirizine HCl (ZYRTEC PO) Take by mouth.     cholecalciferol (VITAMIN D3) 25 MCG (1000 UNIT) tablet Take 1 tablet (1,000 Units total) by mouth daily.     Cyanocobalamin (VITAMIN B-12) 3000 MCG SUBL Place 1 tablet under the tongue daily.     ferrous sulfate 325 (65 FE) MG tablet      ibuprofen (ADVIL) 200 MG tablet Take 200 mg by mouth as needed for moderate pain.     No current facility-administered medications for this visit.    PHYSICAL EXAMINATION: ECOG PERFORMANCE STATUS: {CHL ONC ECOG PS:[phone]}  There were no vitals filed for this visit. There were no vitals filed for this visit.  BREAST:*** No palpable masses or nodules in either right or left breasts. No palpable axillary supraclavicular or infraclavicular adenopathy no breast tenderness or nipple discharge. (exam performed in the presence of a chaperone)  LABORATORY DATA:  I have reviewed the data as listed    Latest Ref Rng & Units 04/29/2022    8:53 AM 03/18/2022    8:35 AM 02/04/2022   10:07 AM  CMP  Glucose 70 - 99 mg/dL 161  096  90   BUN 6 - 20 mg/dL 14  22  17  Creatinine 0.44 - 1.00 mg/dL 1.61  0.96  0.45   Sodium 135 - 145 mmol/L 140  140  140   Potassium 3.5 - 5.1 mmol/L 4.1  4.2  3.8   Chloride 98 - 111 mmol/L 106  105  105   CO2 22 - 32 mmol/L 29  28  29    Calcium 8.9 - 10.3 mg/dL 40.9  81.1  9.9   Total Protein 6.5 - 8.1 g/dL 7.2  7.5  7.1   Total Bilirubin 0.3 - 1.2 mg/dL 0.4  0.3  0.4   Alkaline Phos 38 - 126 U/L 130  130  107   AST 15 - 41 U/L 15  14  14    ALT 0 - 44 U/L 18  13  13      Lab Results  Component Value Date    WBC 3.4 (L) 04/29/2022   HGB 10.7 (L) 04/29/2022   HCT 32.9 (L) 04/29/2022   MCV 90.1 04/29/2022   PLT 169 04/29/2022   NEUTROABS 1.8 04/29/2022    ASSESSMENT & PLAN:  No problem-specific Assessment & Plan notes found for this encounter.    No orders of the defined types were placed in this encounter.  The patient has a good understanding of the overall plan. she agrees with it. she will call with any problems that may develop before the next visit here. Total time spent: 30 mins including face to face time and time spent for planning, charting and co-ordination of care   Sherlyn Lick, CMA 05/18/22    I Janan Ridge am acting as a Neurosurgeon for [employer]  ***

## 2022-05-20 ENCOUNTER — Inpatient Hospital Stay: Payer: Federal, State, Local not specified - PPO | Attending: Hematology and Oncology

## 2022-05-20 ENCOUNTER — Other Ambulatory Visit: Payer: Self-pay

## 2022-05-20 ENCOUNTER — Inpatient Hospital Stay: Payer: Federal, State, Local not specified - PPO

## 2022-05-20 ENCOUNTER — Inpatient Hospital Stay (HOSPITAL_BASED_OUTPATIENT_CLINIC_OR_DEPARTMENT_OTHER): Payer: Federal, State, Local not specified - PPO | Admitting: Hematology and Oncology

## 2022-05-20 VITALS — BP 127/80 | HR 78 | Temp 98.3°F | Wt 176.4 lb

## 2022-05-20 DIAGNOSIS — C50411 Malignant neoplasm of upper-outer quadrant of right female breast: Secondary | ICD-10-CM | POA: Insufficient documentation

## 2022-05-20 DIAGNOSIS — Z5112 Encounter for antineoplastic immunotherapy: Secondary | ICD-10-CM | POA: Diagnosis not present

## 2022-05-20 DIAGNOSIS — D72819 Decreased white blood cell count, unspecified: Secondary | ICD-10-CM | POA: Insufficient documentation

## 2022-05-20 DIAGNOSIS — Z79899 Other long term (current) drug therapy: Secondary | ICD-10-CM | POA: Insufficient documentation

## 2022-05-20 DIAGNOSIS — R5383 Other fatigue: Secondary | ICD-10-CM | POA: Insufficient documentation

## 2022-05-20 DIAGNOSIS — Z171 Estrogen receptor negative status [ER-]: Secondary | ICD-10-CM | POA: Insufficient documentation

## 2022-05-20 DIAGNOSIS — R9230 Dense breasts, unspecified: Secondary | ICD-10-CM | POA: Diagnosis not present

## 2022-05-20 LAB — CBC WITH DIFFERENTIAL (CANCER CENTER ONLY)
Abs Immature Granulocytes: 0 10*3/uL (ref 0.00–0.07)
Basophils Absolute: 0 10*3/uL (ref 0.0–0.1)
Basophils Relative: 1 %
Eosinophils Absolute: 0.3 10*3/uL (ref 0.0–0.5)
Eosinophils Relative: 7 %
HCT: 32.5 % — ABNORMAL LOW (ref 36.0–46.0)
Hemoglobin: 10.6 g/dL — ABNORMAL LOW (ref 12.0–15.0)
Immature Granulocytes: 0 %
Lymphocytes Relative: 32 %
Lymphs Abs: 1.1 10*3/uL (ref 0.7–4.0)
MCH: 28.8 pg (ref 26.0–34.0)
MCHC: 32.6 g/dL (ref 30.0–36.0)
MCV: 88.3 fL (ref 80.0–100.0)
Monocytes Absolute: 0.3 10*3/uL (ref 0.1–1.0)
Monocytes Relative: 9 %
Neutro Abs: 1.7 10*3/uL (ref 1.7–7.7)
Neutrophils Relative %: 51 %
Platelet Count: 180 10*3/uL (ref 150–400)
RBC: 3.68 MIL/uL — ABNORMAL LOW (ref 3.87–5.11)
RDW: 13.2 % (ref 11.5–15.5)
WBC Count: 3.4 10*3/uL — ABNORMAL LOW (ref 4.0–10.5)
nRBC: 0 % (ref 0.0–0.2)

## 2022-05-20 LAB — CMP (CANCER CENTER ONLY)
ALT: 17 U/L (ref 0–44)
AST: 18 U/L (ref 15–41)
Albumin: 4.1 g/dL (ref 3.5–5.0)
Alkaline Phosphatase: 129 U/L — ABNORMAL HIGH (ref 38–126)
Anion gap: 5 (ref 5–15)
BUN: 16 mg/dL (ref 6–20)
CO2: 29 mmol/L (ref 22–32)
Calcium: 9.6 mg/dL (ref 8.9–10.3)
Chloride: 106 mmol/L (ref 98–111)
Creatinine: 1.11 mg/dL — ABNORMAL HIGH (ref 0.44–1.00)
GFR, Estimated: 59 mL/min — ABNORMAL LOW (ref >60)
Glucose, Bld: 97 mg/dL (ref 70–99)
Potassium: 4.1 mmol/L (ref 3.5–5.1)
Sodium: 140 mmol/L (ref 135–145)
Total Bilirubin: 0.4 mg/dL (ref 0.3–1.2)
Total Protein: 7.1 g/dL (ref 6.5–8.1)

## 2022-05-20 MED ORDER — SODIUM CHLORIDE 0.9 % IV SOLN
420.0000 mg | Freq: Once | INTRAVENOUS | Status: AC
  Administered 2022-05-20: 420 mg via INTRAVENOUS
  Filled 2022-05-20: qty 14

## 2022-05-20 MED ORDER — DIPHENHYDRAMINE HCL 25 MG PO CAPS
25.0000 mg | ORAL_CAPSULE | Freq: Once | ORAL | Status: AC
  Administered 2022-05-20: 25 mg via ORAL
  Filled 2022-05-20: qty 1

## 2022-05-20 MED ORDER — SODIUM CHLORIDE 0.9% FLUSH
10.0000 mL | INTRAVENOUS | Status: DC | PRN
  Administered 2022-05-20: 10 mL

## 2022-05-20 MED ORDER — ACETAMINOPHEN 325 MG PO TABS
650.0000 mg | ORAL_TABLET | Freq: Once | ORAL | Status: AC
  Administered 2022-05-20: 650 mg via ORAL
  Filled 2022-05-20: qty 2

## 2022-05-20 MED ORDER — TRASTUZUMAB-DKST CHEMO 150 MG IV SOLR
6.0000 mg/kg | Freq: Once | INTRAVENOUS | Status: AC
  Administered 2022-05-20: 483 mg via INTRAVENOUS
  Filled 2022-05-20: qty 23

## 2022-05-20 MED ORDER — SODIUM CHLORIDE 0.9 % IV SOLN: Freq: Once | INTRAVENOUS | Status: AC

## 2022-05-20 MED ORDER — HEPARIN SOD (PORK) LOCK FLUSH 100 UNIT/ML IV SOLN
500.0000 [IU] | Freq: Once | INTRAVENOUS | Status: AC | PRN
  Administered 2022-05-20: 500 [IU]

## 2022-05-20 NOTE — Assessment & Plan Note (Signed)
06/14/2018: Right lumpectomy: Intraductal papilloma with usual ductal hyperplasia, 1 benign lymph node (decided against tamoxifen) 05/19/2021: Screening mammogram detected right breast density.  2 lymph nodes were noted biopsy appointment was benign.  Ultrasound revealed 2.2 cm mass at 11 o'clock position right breast biopsy: Grade 3 IDC with necrosis, ER 0%, PR 0%, Ki-67 60%, HER2 3+ positive by IHC   Treatment Plan: 1. Neoadjuvant chemotherapy with TCH Perjeta 6 cycles followed by Herceptin Perjeta maintenance versus Kadcyla maintenance (based on response to neoadjuvant chemo) for 1 year 2. 11/03/2021: Right lumpectomy: Pathologic complete response, 0/4 lymph nodes, margins negative 3. Followed by adjuvant radiation therapy 12/08/2021-12/25/2021 ------------------------------------------------------------------------------------------------------------------------------- Treatment plan: Today's last cycle of Herceptin Perjeta maintenance Herceptin Perjeta toxicities: None 02/17/2012 right back soft tissue excision: Inflammation with focal giant cell reaction consistent with ruptured epidermal cyst   Leukopenia: Monitoring and monitoring Breast cancer surveillance: Mammograms will need to be arranged. Return to clinic in 6 months for follow-up and after that we can see her once a year

## 2022-05-20 NOTE — Patient Instructions (Signed)
[facility]  Discharge Instructions: Thank you for choosing [facility] to provide your oncology and hematology care.   If you have a lab appointment with the Cancer Center, please go directly to the Cancer Center and check in at the registration area.   Wear comfortable clothing and clothing appropriate for easy access to any Portacath or PICC line.   We strive to give you quality time with your provider. You may need to reschedule your appointment if you arrive late (15 or more minutes).  Arriving late affects you and other patients whose appointments are after yours.  Also, if you miss three or more appointments without notifying the office, you may be dismissed from the clinic at the provider's discretion.      For prescription refill requests, have your pharmacy contact our office and allow 72 hours for refills to be completed.    Today you received the following chemotherapy and/or immunotherapy agents: trastuzumab and pertuzumab      To help prevent nausea and vomiting after your treatment, we encourage you to take your nausea medication as directed.  BELOW ARE SYMPTOMS THAT SHOULD BE REPORTED IMMEDIATELY: *FEVER GREATER THAN 100.4 F (38 C) OR HIGHER *CHILLS OR SWEATING *NAUSEA AND VOMITING THAT IS NOT CONTROLLED WITH YOUR NAUSEA MEDICATION *UNUSUAL SHORTNESS OF BREATH *UNUSUAL BRUISING OR BLEEDING *URINARY PROBLEMS (pain or burning when urinating, or frequent urination) *BOWEL PROBLEMS (unusual diarrhea, constipation, pain near the anus) TENDERNESS IN MOUTH AND THROAT WITH OR WITHOUT PRESENCE OF ULCERS (sore throat, sores in mouth, or a toothache) UNUSUAL RASH, SWELLING OR PAIN  UNUSUAL VAGINAL DISCHARGE OR ITCHING   Items with * indicate a potential emergency and should be followed up as soon as possible or go to the Emergency Department if any problems should occur.  Please show the CHEMOTHERAPY ALERT CARD or IMMUNOTHERAPY  ALERT CARD at check-in to the Emergency Department and triage nurse.  Should you have questions after your visit or need to cancel or reschedule your appointment, please contact [facility]  Dept: [phone]  and follow the prompts.  Office hours are 8:00 a.m. to 4:30 p.m. Monday - Friday. Please note that voicemails left after 4:00 p.m. may not be returned until the following business day.  We are closed weekends and major holidays. You have access to a nurse at all times for urgent questions. Please call the main number to the clinic Dept: [phone] and follow the prompts.   For any non-urgent questions, you may also contact your provider using MyChart. We now offer e-Visits for anyone 18 and older to request care online for non-urgent symptoms. For details visit [email or web address].   Also download the MyChart app! Go to the app store, search "MyChart", open the app, select [facility], and log in with your MyChart username and password.   

## 2022-05-21 ENCOUNTER — Telehealth: Payer: Self-pay | Admitting: Hematology and Oncology

## 2022-05-21 NOTE — Telephone Encounter (Signed)
Scheduled appointment per 5/16 los. Patient is aware of the made appointment.

## 2022-05-22 ENCOUNTER — Other Ambulatory Visit: Payer: Self-pay

## 2022-05-25 ENCOUNTER — Encounter: Payer: Self-pay | Admitting: *Deleted

## 2022-05-25 DIAGNOSIS — Z171 Estrogen receptor negative status [ER-]: Secondary | ICD-10-CM

## 2022-06-01 ENCOUNTER — Encounter: Payer: Self-pay | Admitting: Hematology and Oncology

## 2022-06-01 NOTE — Progress Notes (Signed)
REFERRING PROVIDER: Serena Croissant, MD [address] [place],  [place] [address]  PRIMARY PROVIDER:  Wilfrid Lund, PA  PRIMARY REASON FOR VISIT:  1. Malignant neoplasm of upper-outer quadrant of right breast in female, estrogen receptor negative (HCC)   2. Family history of breast cancer   3. Family history of pancreatic cancer   4. Genetic testing     HISTORY OF PRESENT ILLNESS:   Ms. Semones, a 57 y.o. female, was seen for a [place] cancer genetics consultation at the request of Dr. Pamelia Hoit due to a personal and family history of breast cancer.  Ms. Lochan presents to clinic today to discuss previous genetic testing results and updated genetic testing.   Ms. Cichy was previously seen in In March 2020, Ms. Mckell had negative genetic testing for the [employer].  The [facility] Multi-Cancer +RNA Panel included sequencing and deletion/duplication analysis of the following 85 genes: AIP, ALK, APC, ATM, AXIN2,BAP1,  BARD1, BLM, BMPR1A, BRCA1, BRCA2, BRIP1, CASR, CDC73, CDH1, CDK4, CDKN1B, CDKN1C, CDKN2A (p14ARF), CDKN2A (p16INK4a), CEBPA, CHEK2, CTNNA1, DICER1, DIS3L2, EGFR (c.2369C>T, p.Thr790Met variant only), EPCAM (Deletion/duplication testing only), FH, FLCN, GATA2, GPC3, GREM1 (Promoter region deletion/duplication testing only), HOXB13 (c.251G>A, p.Gly84Glu), HRAS, KIT, MAX, MEN1, MET, MITF (c.952G>A, p.Glu318Lys variant only), MLH1, MSH2, MSH3, MSH6, MUTYH, NBN, NF1, NF2, NTHL1, PALB2, PDGFRA, PHOX2B, PMS2, POLD1, POLE, POT1, PRKAR1A, PTCH1, PTEN, RAD50, RAD51C, RAD51D, RB1, RECQL4, RET, RNF43, RUNX1, SDHAF2, SDHA (sequence changes only), SDHB, SDHC, SDHD, SMAD4, SMARCA4, SMARCB1, SMARCE1, STK11, SUFU, TERC, TERT, TMEM127, TP53, TSC1, TSC2, VHL, WRN and WT1.      In May 2023, at the age of 13, Ms. Giauque was diagnosed with invasive ductal carcinoma of the right breast (ER-/PR-/HER2+). Marland Kitchen    CANCER HISTORY:  Oncology History  Malignant neoplasm of  upper-outer quadrant of right breast in female, estrogen receptor negative (HCC)  06/14/2018 Surgery   06/14/2018: Right lumpectomy: Intraductal papilloma with usual ductal hyperplasia, 1 benign lymph node (decided against tamoxifen)   05/19/2021 Relapse/Recurrence   Screening mammogram detected right breast density.  2 lymph nodes were noted biopsy appointment was benign.  Ultrasound revealed 2.2 cm mass at 11 o'clock position right breast biopsy: Grade 3 IDC with necrosis, ER 0%, PR 0%, Ki-67 60%, HER2 3+ positive by [facility]   05/27/2021 Cancer Staging   Staging form: Breast, AJCC 8th Edition - Clinical: Stage IIA (cT2, cN0, cM0, G3, ER-, PR-, HER2+) - Signed by Serena Croissant, MD on 05/27/2021 Stage prefix: Initial diagnosis Histologic grading system: 3 grade system   06/18/2021 - 08/22/2021 Chemotherapy   Patient is on Treatment Plan : BREAST  Docetaxel + Carboplatin + Trastuzumab + Pertuzumab  (TCHP) q21d      06/18/2021 -  Chemotherapy   Patient is on Treatment Plan : BREAST  Docetaxel + Carboplatin + Trastuzumab + Pertuzumab  (TCHP) q21d / Trastuzumab + Pertuzumab q21d     11/03/2021 Surgery   Right lumpectomy: Pathologic complete response, 0/4 lymph nodes, margins negative    12/07/2021 - 12/25/2021 Radiation Therapy   Plan Name: Breast_R Site: Breast, Right Technique: 3D Mode: Photon Dose Per Fraction: 2.67 Gy Prescribed Dose (Delivered / Prescribed): 40.05 Gy / 40.05 Gy Prescribed Fxs (Delivered / Prescribed): 15 / 15         Past Medical History:  Diagnosis Date   Anemia    TAKES IRON PILLS   Anxiety    Cancer (HCC)    right breast DCIS   Depression    PONV (postoperative  nausea and vomiting)     Past Surgical History:  Procedure Laterality Date   ABDOMINAL HYSTERECTOMY     BREAST LUMPECTOMY WITH RADIOACTIVE SEED AND SENTINEL LYMPH NODE BIOPSY Right 11/03/2021   Procedure: RIGHT BREAST BRACKETED LUMPECTOMY WITH RADIOACTIVE SEED AND SENTINEL LYMPH NODE BIOPSY;  Surgeon:  Harriette Bouillon, MD;  Location: [facility];  Service: General;  Laterality: Right;   BREAST LUMPECTOMY WITH RADIOACTIVE SEED LOCALIZATION Right 06/14/2018   Procedure: RIGHT BREAST RADIOACTIVE SEED LOCALIZATION LUMPECTOMY X2;  Surgeon: Harriette Bouillon, MD;  Location: [facility];  Service: General;  Laterality: Right;   BREAST LUMPECTOMY WITH RADIOACTIVE SEED LOCALIZATION Left 11/03/2021   Procedure: LEFT BREAST BRACKETED LUMPECTOMY WITH RADIOACTIVE SEED LOCALIZATION;  Surgeon: Harriette Bouillon, MD;  Location: [facility];  Service: General;  Laterality: Left;   IR IMAGING GUIDED PORT INSERTION  06/17/2021   LAPAROSCOPIC ASSISTED VAGINAL HYSTERECTOMY N/A 02/27/2014   Procedure: LAPAROSCOPIC ASSISTED VAGINAL HYSTERECTOMY;  Surgeon: Dorien Chihuahua. Richardson Dopp, MD;  Location: [facility];  Service: Gynecology;  Laterality: N/A;  abdomen to vagina to abdomen   MASS EXCISION Right 02/16/2022   Procedure: EXCISION RIGHT BACK MASS/CYST;  Surgeon: Harriette Bouillon, MD;  Location:  SURGERY CENTER;  Service: General;  Laterality: Right;  GEN AND LOCAL   TONSILLECTOMY      FAMILY HISTORY:  We obtained a detailed, 4-generation family history.  Significant diagnoses are listed below: Family History  Problem Relation Age of Onset   Breast cancer Mother        dx. late 55s; s/p mastectomy   Pancreatic cancer Mother 56       adenocarcinoma   Other Sister        one sister had a hysterectomy due to heavy bleeding at the age of 90   Heart attack Maternal Uncle 41   Heart attack Maternal Grandmother 77   Prostate cancer Maternal Grandfather 15       "very aggressive"   Leukemia Other 62       maternal great aunt (MGM's sister)   Stomach cancer Other        maternal great aunt (MGF's sister) dx. over age 7   Heart Problems Paternal Grandmother    Cancer Other        maternal great uncle (MGM's brother) dx. NOS cancer at later age   Breast cancer Cousin        (2) maternal  "2nd cousins" dx. with breast cancers in their mid-50s   Colon cancer Neg Hx      Ms. Zanella is unaware of previous family history of genetic testing for hereditary cancer risks. There is no reported Ashkenazi Jewish ancestry. There is no known consanguinity.  GENETIC COUNSELING ASSESSMENT: Ms. Mccuskey is a 57 y.o. female with a personal history of breast cancer in the presence of a family history of breast/pancreatic/prostate cancers with previous negative genetic testing.   DISCUSSION  Even though a pathogenic variant was not identified, possible explanations for the cancer in the family may include: There may be no hereditary risk for cancer in the family. The cancers in Ms. Harman and/or her family may be sporadic/familial or due to other genetic and environmental factors. There may be a gene mutation in one of these genes that current testing methods cannot detect but that chance is small. There could be another gene that has not yet been discovered, or that we have not yet tested, that is responsible for the cancer diagnoses in the  family.  It is also possible there is a hereditary cause for the cancer in the family that Ms. Kuchinsky did not inherit.   Therefore, it is important to remain in touch with cancer genetics in the future so that we can continue to offer Ms. Girven the most up to date genetic testing.   ADDITIONAL GENETIC TESTING:  Ms. Stay genetic testing was fairly extensive.  All relevant genes were included based on the known personal and family history.  We discussed that RNA analysis can, in rare cases, detected variants missed by DNA analysis alone.  She is not interested in updated genetic testing at this time but can consider in the future, especially if, in the future, there are additional relevant genes identified to increase cancer risk.   CANCER SCREENING RECOMMENDATIONS:  Ms. Sengupta test result is considered negative (normal).  This means that we  have not identified a hereditary cause for her personal history of breast cancer at this time.   An individual's cancer risk and medical management are not determined by genetic test results alone. Overall cancer risk assessment incorporates additional factors, including personal medical history, family history, and any available genetic information that may result in a personalized plan for cancer prevention and surveillance. Therefore, it is recommended she continue to follow the cancer management and screening guidelines provided by her oncology and primary healthcare provider.  RECOMMENDATIONS FOR FAMILY MEMBERS:   Since she did not inherit a identifiable mutation in a cancer predisposition gene included on this panel, her children could not have inherited a known mutation from her in one of these genes. Individuals in this family might be at some increased risk of developing cancer, over the general population risk, due to the family history of cancer.  Individuals in the family should notify their providers of the family history of cancer. We recommend women in this family have a yearly mammogram beginning at age 39, or 24 years younger than the earliest onset of cancer, an annual clinical breast exam, and perform monthly breast self-exams.  Risk models that take into account family history and hormonal history may be helpful in determining appropriate breast cancer screening options for family members. Female relatives should speak with their providers about prostate cancer screening.  Other members of the family may still carry a pathogenic variant in one of these genes that Ms. Zamor did not inherit. Based on her deceased mother's history of breast and pancreatic cancer, her mother's first degree relatives (children, siblings) should be offered genetic counseling/testing.    FOLLOW-UP:  Cancer genetics is a rapidly advancing field and it is possible that new genetic tests will be appropriate for  her and/or her family members in the future. We encourage Ms. Krinsky to remain in contact with cancer genetics, so we can update her personal and family histories and let her know of advances in cancer genetics that may benefit this family.   Our contact number was provided. She knows she is welcome to call us at anytime with additional questions or concerns.   Kari Montero M. Rennie Plowman, MS, [facility] Genetic Counselor [email or web address] (P) [phone]

## 2022-06-07 ENCOUNTER — Other Ambulatory Visit: Payer: Self-pay

## 2022-06-07 ENCOUNTER — Encounter (HOSPITAL_COMMUNITY): Payer: Self-pay | Admitting: Surgery

## 2022-06-07 NOTE — Progress Notes (Signed)
Patricia Negro denies chest pain or shortness of breath.  Patient denies having any s/s of Covid in her household, also denies any known exposure to Covid. Patricia Houston  any s/s of upper or lower respiratory in the past 8 weeks.   Patricia. Ninan' PCP is Horton Marshall, [place].

## 2022-06-08 ENCOUNTER — Ambulatory Visit (HOSPITAL_BASED_OUTPATIENT_CLINIC_OR_DEPARTMENT_OTHER)
Admission: RE | Admit: 2022-06-08 | Discharge: 2022-06-08 | Disposition: A | Discharge status: Home / Self Care | Payer: Federal, State, Local not specified - PPO | Attending: Surgery | Admitting: Surgery

## 2022-06-08 ENCOUNTER — Ambulatory Visit (HOSPITAL_BASED_OUTPATIENT_CLINIC_OR_DEPARTMENT_OTHER): Payer: Federal, State, Local not specified - PPO | Admitting: Anesthesiology

## 2022-06-08 ENCOUNTER — Encounter (HOSPITAL_BASED_OUTPATIENT_CLINIC_OR_DEPARTMENT_OTHER)
Admission: RE | Disposition: A | Discharge status: Home / Self Care | Payer: Self-pay | Source: Home / Self Care | Attending: Surgery

## 2022-06-08 ENCOUNTER — Encounter (HOSPITAL_BASED_OUTPATIENT_CLINIC_OR_DEPARTMENT_OTHER): Payer: Self-pay | Admitting: Surgery

## 2022-06-08 ENCOUNTER — Other Ambulatory Visit: Payer: Self-pay

## 2022-06-08 DIAGNOSIS — Z9221 Personal history of antineoplastic chemotherapy: Secondary | ICD-10-CM | POA: Diagnosis not present

## 2022-06-08 DIAGNOSIS — Z452 Encounter for adjustment and management of vascular access device: Secondary | ICD-10-CM | POA: Diagnosis not present

## 2022-06-08 DIAGNOSIS — Z803 Family history of malignant neoplasm of breast: Secondary | ICD-10-CM | POA: Diagnosis not present

## 2022-06-08 HISTORY — DX: Cardiac murmur, unspecified: R01.1

## 2022-06-08 HISTORY — DX: Family history of other specified conditions: Z84.89

## 2022-06-08 HISTORY — PX: PORT-A-CATH REMOVAL: SHX5289

## 2022-06-08 HISTORY — DX: Prediabetes: R73.03

## 2022-06-08 HISTORY — DX: Unspecified osteoarthritis, unspecified site: M19.90

## 2022-06-08 SURGERY — REMOVAL PORT-A-CATH
Anesthesia: Monitor Anesthesia Care | Site: Chest | Laterality: Left

## 2022-06-08 MED ORDER — LACTATED RINGERS IV SOLN: INTRAVENOUS | Status: DC

## 2022-06-08 MED ORDER — BUPIVACAINE-EPINEPHRINE 0.25% -1:200000 IJ SOLN
INTRAMUSCULAR | Status: DC | PRN
  Administered 2022-06-08: 18 mL

## 2022-06-08 MED ORDER — ONDANSETRON HCL 4 MG/2ML IJ SOLN
INTRAMUSCULAR | Status: DC | PRN
  Administered 2022-06-08: 4 mg via INTRAVENOUS

## 2022-06-08 MED ORDER — OXYCODONE HCL 5 MG PO TABS
5.0000 mg | ORAL_TABLET | Freq: Four times a day (QID) | ORAL | 0 refills | Status: DC | PRN
Start: 2022-06-08 — End: 2023-11-23

## 2022-06-08 MED ORDER — CHLORHEXIDINE GLUCONATE CLOTH 2 % EX PADS: 6.0000 | MEDICATED_PAD | Freq: Once | CUTANEOUS | Status: DC

## 2022-06-08 MED ORDER — MIDAZOLAM HCL 2 MG/2ML IJ SOLN
INTRAMUSCULAR | Status: AC
  Filled 2022-06-08: qty 2

## 2022-06-08 MED ORDER — CEFAZOLIN SODIUM-DEXTROSE 2-4 GM/100ML-% IV SOLN
INTRAVENOUS | Status: AC
  Filled 2022-06-08: qty 100

## 2022-06-08 MED ORDER — FENTANYL CITRATE (PF) 100 MCG/2ML IJ SOLN
INTRAMUSCULAR | Status: DC | PRN
  Administered 2022-06-08: 50 ug via INTRAVENOUS

## 2022-06-08 MED ORDER — MIDAZOLAM HCL 5 MG/5ML IJ SOLN
INTRAMUSCULAR | Status: DC | PRN
  Administered 2022-06-08: 2 mg via INTRAVENOUS

## 2022-06-08 MED ORDER — PROPOFOL 10 MG/ML IV BOLUS
INTRAVENOUS | Status: DC | PRN
  Administered 2022-06-08: 20 mg via INTRAVENOUS
  Administered 2022-06-08: 40 mg via INTRAVENOUS

## 2022-06-08 MED ORDER — ONDANSETRON HCL 4 MG/2ML IJ SOLN
INTRAMUSCULAR | Status: AC
  Filled 2022-06-08: qty 2

## 2022-06-08 MED ORDER — LIDOCAINE HCL (CARDIAC) PF 100 MG/5ML IV SOSY
PREFILLED_SYRINGE | INTRAVENOUS | Status: DC | PRN
  Administered 2022-06-08: 20 mg via INTRAVENOUS

## 2022-06-08 MED ORDER — BUPIVACAINE-EPINEPHRINE (PF) 0.25% -1:200000 IJ SOLN
INTRAMUSCULAR | Status: AC
  Filled 2022-06-08: qty 30

## 2022-06-08 MED ORDER — CEFAZOLIN SODIUM-DEXTROSE 2-4 GM/100ML-% IV SOLN
2.0000 g | INTRAVENOUS | Status: AC
  Administered 2022-06-08: 2 g via INTRAVENOUS

## 2022-06-08 MED ORDER — FENTANYL CITRATE (PF) 100 MCG/2ML IJ SOLN
INTRAMUSCULAR | Status: AC
  Filled 2022-06-08: qty 2

## 2022-06-08 MED ORDER — PROPOFOL 500 MG/50ML IV EMUL
INTRAVENOUS | Status: DC | PRN
  Administered 2022-06-08: 100 ug/kg/min via INTRAVENOUS

## 2022-06-08 SURGICAL SUPPLY — 33 items
ADH SKN CLS APL DERMABOND .7 (GAUZE/BANDAGES/DRESSINGS) ×1
APL PRP STRL LF DISP 70% ISPRP (MISCELLANEOUS) ×1
APL SKNCLS STERI-STRIP NONHPOA (GAUZE/BANDAGES/DRESSINGS)
BENZOIN TINCTURE PRP APPL 2/3 (GAUZE/BANDAGES/DRESSINGS) IMPLANT
BLADE SURG 15 STRL LF DISP TIS (BLADE) ×1 IMPLANT
BLADE SURG 15 STRL SS (BLADE) ×1
CHLORAPREP W/TINT 26 (MISCELLANEOUS) ×1 IMPLANT
COVER BACK TABLE 60X90IN (DRAPES) ×1 IMPLANT
COVER MAYO STAND STRL (DRAPES) ×1 IMPLANT
DERMABOND ADVANCED .7 DNX12 (GAUZE/BANDAGES/DRESSINGS) ×1 IMPLANT
DRAPE LAPAROTOMY 100X72 PEDS (DRAPES) ×1 IMPLANT
DRAPE UTILITY XL STRL (DRAPES) ×1 IMPLANT
ELECT REM PT RETURN 9FT ADLT (ELECTROSURGICAL) ×1
ELECTRODE REM PT RTRN 9FT ADLT (ELECTROSURGICAL) ×1 IMPLANT
GLOVE BIOGEL PI IND STRL 8 (GLOVE) ×1 IMPLANT
GLOVE ECLIPSE 8.0 STRL XLNG CF (GLOVE) ×1 IMPLANT
GOWN STRL REUS W/ TWL LRG LVL3 (GOWN DISPOSABLE) ×2 IMPLANT
GOWN STRL REUS W/ TWL XL LVL3 (GOWN DISPOSABLE) ×1 IMPLANT
GOWN STRL REUS W/TWL LRG LVL3 (GOWN DISPOSABLE) ×2
GOWN STRL REUS W/TWL XL LVL3 (GOWN DISPOSABLE) ×1
NDL HYPO 25X1 1.5 SAFETY (NEEDLE) ×1 IMPLANT
NEEDLE HYPO 25X1 1.5 SAFETY (NEEDLE) ×1 IMPLANT
NS IRRIG 1000ML POUR BTL (IV SOLUTION) ×1 IMPLANT
PACK BASIN DAY SURGERY FS (CUSTOM PROCEDURE TRAY) ×1 IMPLANT
PENCIL SMOKE EVACUATOR (MISCELLANEOUS) IMPLANT
SLEEVE SCD COMPRESS KNEE MED (STOCKING) IMPLANT
SPIKE FLUID TRANSFER (MISCELLANEOUS) ×1 IMPLANT
SPONGE T-LAP 4X18 ~~LOC~~+RFID (SPONGE) ×1 IMPLANT
STRIP CLOSURE SKIN 1/2X4 (GAUZE/BANDAGES/DRESSINGS) IMPLANT
SUT MON AB 4-0 PC3 18 (SUTURE) ×1 IMPLANT
SUT VICRYL 3-0 CR8 SH (SUTURE) ×1 IMPLANT
SYR CONTROL 10ML LL (SYRINGE) ×1 IMPLANT
TOWEL GREEN STERILE FF (TOWEL DISPOSABLE) ×1 IMPLANT

## 2022-06-08 NOTE — Transfer of Care (Signed)
Immediate Anesthesia Transfer of Care Note  Patient: Patricia Houston  Procedure(s) Performed: REMOVAL PORT-A-CATH (Left: Chest)  Patient Location: PACU  Anesthesia Type:MAC  Level of Consciousness: awake, alert , and oriented  Airway & Oxygen Therapy: Patient Spontanous Breathing  Post-op Assessment: Report given to RN and Post -op Vital signs reviewed and stable  Post vital signs: Reviewed and stable  Last Vitals:  Vitals Value Taken Time  BP    Temp    Pulse 97 06/08/22 1432  Resp 25 06/08/22 1432  SpO2 97 % 06/08/22 1432  Vitals shown include unvalidated device data.  Last Pain:  Vitals:   06/08/22 1257  TempSrc: Oral  PainSc: 0-No pain      Patients Stated Pain Goal: 3 (06/08/22 1257)  Complications: No notable events documented.

## 2022-06-08 NOTE — Interval H&P Note (Signed)
History and Physical Interval Note:  06/08/2022 1:36 PM  Patricia Houston  has presented today for surgery, with the diagnosis of PORT REMOVAL.  The various methods of treatment have been discussed with the patient and family. After consideration of risks, benefits and other options for treatment, the patient has consented to  Procedure(s): REMOVAL PORT-A-CATH (N/A) as a surgical intervention.  The patient's history has been reviewed, patient examined, no change in status, stable for surgery.  I have reviewed the patient's chart and labs.  Questions were answered to the patient's satisfaction.     Leotha Voeltz A Raziyah Vanvleck

## 2022-06-08 NOTE — Discharge Instructions (Addendum)
####################################################### ° °GENERAL SURGERY: POST OP INSTRUCTIONS ° °###################################################################### ° °EAT °Gradually transition to a high fiber diet with a fiber supplement over the next few weeks after discharge.  Start with a pureed / full liquid diet (see below) ° °WALK °Walk an hour a day.  Control your pain to do that.   ° °CONTROL PAIN °Control pain so that you can walk, sleep, tolerate sneezing/coughing, go up/down stairs. ° °HAVE A BOWEL MOVEMENT DAILY °Keep your bowels regular to avoid problems.  OK to try a laxative to override constipation.  OK to use an antidairrheal to slow down diarrhea.  Call if not better after 2 tries ° °CALL IF YOU HAVE PROBLEMS/CONCERNS °Call if you are still struggling despite following these instructions. °Call if you have concerns not answered by these instructions ° °###################################################################### ° ° ° °DIET: Follow a light bland diet & liquids the first 24 hours after arrival home, such as soup, liquids, starches, etc.  Be sure to drink plenty of fluids.  Quickly advance to a usual solid diet within a few days.  Avoid fast food or heavy meals as your are more likely to get nauseated or have irregular bowels.  A low-fat, high-fiber diet for the rest of your life is ideal.   ° °Take your usually prescribed home medications unless otherwise directed. ° °PAIN CONTROL: °Pain is best controlled by a usual combination of three different methods TOGETHER: °Ice/Heat °Over the counter pain medication °Prescription pain medication °Most patients will experience some swelling and bruising around the incisions.  Ice packs or heating pads (30-60 minutes up to 6 times a day) will help. Use ice for the first few days to help decrease swelling and bruising, then switch to heat to help relax tight/sore spots and speed recovery.  Some people prefer to use ice alone, heat alone,  alternating between ice & heat.  Experiment to what works for you.  Swelling and bruising can take several weeks to resolve.   °It is helpful to take an over-the-counter pain medication regularly for the first few weeks.  Choose one of the following that works best for you: °Naproxen (Aleve, etc)  Two 220mg tabs twice a day °Ibuprofen (Advil, etc) Three 200mg tabs four times a day (every meal & bedtime) °Acetaminophen (Tylenol, etc) 500-650mg four times a day (every meal & bedtime) °A  prescription for pain medication (such as oxycodone, hydrocodone, etc) should be given to you upon discharge.  Take your pain medication as prescribed.  °If you are having problems/concerns with the prescription medicine (does not control pain, nausea, vomiting, rash, itching, etc), please call us [phone] to see if we need to switch you to a different pain medicine that will work better for you and/or control your side effect better. °If you need a refill on your pain medication, please contact your pharmacy.  They will contact our office to request authorization. Prescriptions will not be filled after 5 pm or on week-ends. ° °Avoid getting constipated.  Between the surgery and the pain medications, it is common to experience some constipation.  Increasing fluid intake and taking a fiber supplement (such as Metamucil, Citrucel, FiberCon, MiraLax, etc) 1-2 times a day regularly will usually help prevent this problem from occurring.  A mild laxative (prune juice, Milk of Magnesia, MiraLax, etc) should be taken according to package directions if there are no bowel movements after 48 hours.   °Watch out for diarrhea.  If you have many loose bowel movements, simplify your   diet to bland foods & liquids for a few days.  Stop any stool softeners and decrease your fiber supplement.  Switching to mild anti-diarrheal medications (Loperamide/Imodium, Kayopectate, Pepto Bismol) can help.  If this worsens or does not improve, please call  us. ° °Wash / shower every day.  You may shower over the dressings as they are waterproof.  Continue to shower over incision(s) after the dressing is off. °Remove your waterproof bandages 5 days after surgery.  You may leave the incision open to air.  You may have skin tapes (Steri Strips) covering the incision(s).  Leave them on until one week, then remove.  You may replace a dressing/Band-Aid to cover the incision for comfort if you wish.  ° °ACTIVITIES as tolerated:   °You may resume regular (light) daily activities beginning the next day--such as daily self-care, walking, climbing stairs--gradually increasing activities as tolerated.  If you can walk 30 minutes without difficulty, it is safe to try more intense activity such as jogging, treadmill, bicycling, low-impact aerobics, swimming, etc. °Save the most intensive and strenuous activity for last such as sit-ups, heavy lifting, contact sports, etc  Refrain from any heavy lifting or straining until you are off narcotics for pain control.   °DO NOT PUSH THROUGH PAIN.  Let pain be your guide: If it hurts to do something, don't do it.  Pain is your body warning you to avoid that activity for another week until the pain goes down. °You may drive when you are no longer taking prescription pain medication, you can comfortably wear a seatbelt, and you can safely maneuver your car and apply brakes. °You may have sexual intercourse when it is comfortable.  ° °FOLLOW UP in our office °Please call [facility] at [phone] to set up an appointment to see your surgeon in the office for a follow-up appointment approximately 2-3 weeks after your surgery. °Make sure that you call for this appointment the day you arrive home to insure a convenient appointment time. ° °9. IF YOU HAVE DISABILITY OR FAMILY LEAVE FORMS, BRING THEM TO THE OFFICE FOR PROCESSING.  DO NOT GIVE THEM TO YOUR DOCTOR. ° ° °WHEN TO CALL US [phone]: °Poor pain control °Reactions / problems with new  medications (rash/itching, nausea, etc)  °Fever over 101.5 F (38.5 C) °Worsening swelling or bruising °Continued bleeding from incision. °Increased pain, redness, or drainage from the incision °Difficulty breathing / swallowing ° ° The clinic staff is available to answer your questions during regular business hours (8:30am-5pm).  Please don’t hesitate to call and ask to speak to one of our nurses for clinical concerns.  ° If you have a medical emergency, go to the nearest emergency room or call 911. ° A surgeon from [facility] is always on call at the hospitals ° ° °[facility] °[address], [address], [place], [place]  [address] ? °MAIN: [phone] ? TOLL FREE: [phone] ?  °FAX [phone] °[email or web address] ° °#######################################################  ° ° °Post Anesthesia Home Care Instructions ° °Activity: °Get plenty of rest for the remainder of the day. A responsible individual must stay with you for 24 hours following the procedure.  °For the next 24 hours, DO NOT: °-Drive a car °-Operate machinery °-Drink alcoholic beverages °-Take any medication unless instructed by your physician °-Make any legal decisions or sign important papers. ° °Meals: °Start with liquid foods such as gelatin or soup. Progress to regular foods as tolerated. Avoid greasy, spicy, heavy foods.   If nausea and/or vomiting occur, drink only clear liquids until the nausea and/or vomiting subsides. Call your physician if vomiting continues. ° °Special Instructions/Symptoms: °Your throat may feel dry or sore from the anesthesia or the breathing tube placed in your throat during surgery. If this causes discomfort, gargle with warm salt water. The discomfort should disappear within 24 hours. ° °If you had a scopolamine patch placed behind your ear for the management of post- operative nausea and/or vomiting: ° °1. The medication in the patch is effective for 72 hours,  after which it should be removed.  Wrap patch in a tissue and discard in the trash. Wash hands thoroughly with soap and water. °2. You may remove the patch earlier than 72 hours if you experience unpleasant side effects which may include dry mouth, dizziness or visual disturbances. °3. Avoid touching the patch. Wash your hands with soap and water after contact with the patch. °    °

## 2022-06-08 NOTE — H&P (Signed)
History of Present Illness: Patricia Houston is a 57 y.o. female who is seen today for stump check after excision of right back cyst. Final pathology showed ruptured epidermal cyst..    Review of Systems: A complete review of systems was obtained from the patient. I have reviewed this information and discussed as appropriate with the patient. See HPI as well for other ROS.    Medical History: Past Medical History:  Diagnosis Date  Anemia  Arthritis   There is no problem list on file for this patient.  Past Surgical History:  Procedure Laterality Date  MASTECTOMY PARTIAL / LUMPECTOMY 06/2018  e/o back cyst  HYSTERECTOMY VAGINAL    No Known Allergies  Current Outpatient Medications on File Prior to Visit  Medication Sig Dispense Refill  cholecalciferol (VITAMIN D3) 1000 unit tablet Take by mouth  cyanocobalamin, vitamin B-12, 3,000 mcg Subl Place under the tongue  ferrous sulfate 325 (65 FE) MG tablet 1 tablet   No current facility-administered medications on file prior to visit.   Family History  Problem Relation Age of Onset  Obesity Mother  High blood pressure (Hypertension) Mother  Hyperlipidemia (Elevated cholesterol) Mother  Diabetes Mother  Breast cancer Mother  High blood pressure (Hypertension) Father  Hyperlipidemia (Elevated cholesterol) Father  Coronary Artery Disease (Blocked arteries around heart) Father  Obesity Sister  High blood pressure (Hypertension) Sister  Hyperlipidemia (Elevated cholesterol) Sister  Diabetes Sister  Hyperlipidemia (Elevated cholesterol) Brother  High blood pressure (Hypertension) Brother    Social History   Tobacco Use  Smoking Status Never  Smokeless Tobacco Never    Social History   Socioeconomic History  Marital status: Married  Tobacco Use  Smoking status: Never  Smokeless tobacco: Never  Vaping Use  Vaping Use: Never used  Substance and Sexual Activity  Alcohol use: Yes  Drug use: Never   Objective:    Vitals:  03/15/22 1328  PainSc: 0-No pain   There is no height or weight on file to calculate BMI. Port site CDI left  Incision right back clean dry intact.  Labs, Imaging and Diagnostic Testing:  SURGICAL PATHOLOGY CASE: [id number] PATIENT: Patricia Houston Surgical Pathology Report  Clinical History: Excision right back mass/cyst (nt)  FINAL MICROSCOPIC DIAGNOSIS:  A. SOFT TISSUE, RIGHT BACK MASS, EXCISION: Inflammation with focal giant cell reaction consistent with ruptured epidermal cyst.   Assessment and Plan:   Diagnoses and all orders for this visit:  Post-operative state  Port in place  [place] removal  The procedure has been discussed with the patient.  Alternative therapies have been discussed with the patient.  Operative risks include bleeding,  Infection,  Organ injury,  Nerve injury,  Blood vessel injury,  DVT,  Pulmonary embolism,  Death,  And possible reoperation.  Medical management risks include worsening of present situation.  The success of the procedure is 50 -90 % at treating patients symptoms.  The patient understands and agrees to proceed.       No follow-ups on file.

## 2022-06-08 NOTE — Anesthesia Postprocedure Evaluation (Signed)
Anesthesia Post Note  Patient: Patricia Houston  Procedure(s) Performed: REMOVAL PORT-A-CATH (Left: Chest)     Patient location during evaluation: PACU Anesthesia Type: MAC Level of consciousness: awake and alert and oriented Pain management: pain level controlled Vital Signs Assessment: post-procedure vital signs reviewed and stable Respiratory status: spontaneous breathing, nonlabored ventilation and respiratory function stable Cardiovascular status: stable and blood pressure returned to baseline Postop Assessment: no apparent nausea or vomiting Anesthetic complications: no   No notable events documented.  Last Vitals:  Vitals:   06/08/22 1500 06/08/22 1510  BP: 108/77 109/83  Pulse: 73 75  Resp: 12 13  Temp:    SpO2: 96% 98%    Last Pain:  Vitals:   06/08/22 1500  TempSrc:   PainSc: 0-No pain                 Darleth Eustache A.

## 2022-06-08 NOTE — Op Note (Signed)
Preop diagnosis: Indwelling port a catheter for chemotherapy left   Postop diagnosis: Same  Procedure: Removal of port a catheter  Surgeon: Harriette Bouillon M.D.  Anesthesia: MAC with local  EBL: Minimal  Specimen none  Drains: None  Indications for procedure: The patient presents for removal of port a catheter after completing chemotherapy. The patient no longer requires central venous access. Risks of bleeding, infection, catheter fragmentation, embolization, arrhythmias and damage to arteries, veins and nerves and possibly other mediastinal structures discussed. The patient agrees to proceed.  Description of procedure: The patient was seen in the holding area. Questions were answered. The patient agreed to proceed. The patient was taken to the operating room. The patient was placed supine. Anesthesia was initiated. The skin on the  left upper chest was prepped and draped in a sterile fashion. Timeout was done. The patient received preoperative antibiotics. Incision was made through the old port site and the hub of the Port-A-Cath was seen. The sutures were cut to release the port from the chest wall. The catheter was removed in its entirety without difficulty. The tract was closed with 3-0 Vicryl. 4 Monocryl was used to close the skin. All final counts were correct. The patient was taken to recovery in satisfactory condition.

## 2022-06-08 NOTE — Anesthesia Preprocedure Evaluation (Signed)
Anesthesia Evaluation  Patient identified by MRN, date of birth, ID band Patient awake    Reviewed: Allergy & Precautions, H&P , NPO status , Patient's Chart, lab work & pertinent test results  History of Anesthesia Complications (+) PONV and history of anesthetic complications  Airway Mallampati: II  TM Distance: >3 FB Neck ROM: Full    Dental no notable dental hx.    Pulmonary neg pulmonary ROS   Pulmonary exam normal breath sounds clear to auscultation       Cardiovascular negative cardio ROS Normal cardiovascular exam Rhythm:Regular Rate:Normal     Neuro/Psych negative neurological ROS  negative psych ROS   GI/Hepatic negative GI ROS, Neg liver ROS,,,  Endo/Other  negative endocrine ROS    Renal/GU negative Renal ROS  negative genitourinary   Musculoskeletal negative musculoskeletal ROS (+)    Abdominal   Peds negative pediatric ROS (+)  Hematology negative hematology ROS (+)   Anesthesia Other Findings   Reproductive/Obstetrics negative OB ROS                             Anesthesia Physical Anesthesia Plan  ASA: 2  Anesthesia Plan: MAC   Post-op Pain Management: Minimal or no pain anticipated   Induction: Intravenous  PONV Risk Score and Plan: 3 and TIVA and Treatment may vary due to age or medical condition  Airway Management Planned: Simple Face Mask  Additional Equipment:   Intra-op Plan:   Post-operative Plan:   Informed Consent: I have reviewed the patients History and Physical, chart, labs and discussed the procedure including the risks, benefits and alternatives for the proposed anesthesia with the patient or authorized representative who has indicated his/her understanding and acceptance.     Dental advisory given  Plan Discussed with: CRNA and Surgeon  Anesthesia Plan Comments:        Anesthesia Quick Evaluation

## 2022-06-09 ENCOUNTER — Encounter (HOSPITAL_BASED_OUTPATIENT_CLINIC_OR_DEPARTMENT_OTHER): Payer: Self-pay | Admitting: Surgery

## 2022-06-18 ENCOUNTER — Other Ambulatory Visit (HOSPITAL_COMMUNITY): Payer: Federal, State, Local not specified - PPO

## 2022-06-28 ENCOUNTER — Ambulatory Visit: Payer: Federal, State, Local not specified - PPO | Attending: Hematology and Oncology

## 2022-06-28 VITALS — Wt 176.1 lb

## 2022-06-28 DIAGNOSIS — Z483 Aftercare following surgery for neoplasm: Secondary | ICD-10-CM | POA: Insufficient documentation

## 2022-06-28 NOTE — Therapy (Signed)
OUTPATIENT PHYSICAL THERAPY SOZO SCREENING NOTE   Patient Name: Patricia Houston MRN: [id number] DOB:08/09/1965, 57 y.o., female Today's Date: 06/28/2022  PCP: Wilfrid Lund, [place] REFERRING PROVIDER: Serena Croissant, MD   PT End of Session - 06/28/22 1650     Visit Number 6   # unchanged due to screen only   PT Start Time 1647    PT Stop Time 1651    PT Time Calculation (min) 4 min    Activity Tolerance Patient tolerated treatment well    Behavior During Therapy WFL for tasks assessed/performed             Past Medical History:  Diagnosis Date   Anemia    TAKES IRON PILLS   Anxiety    left hip   Arthritis    Cancer (HCC)    right breast DCIS   Depression    Family history of adverse reaction to anesthesia    sister- woke up during the procedure   Heart murmur    as a child   PONV (postoperative nausea and vomiting)    Pre-diabetes    Past Surgical History:  Procedure Laterality Date   ABDOMINAL HYSTERECTOMY     BREAST LUMPECTOMY WITH RADIOACTIVE SEED AND SENTINEL LYMPH NODE BIOPSY Right 11/03/2021   Procedure: RIGHT BREAST BRACKETED LUMPECTOMY WITH RADIOACTIVE SEED AND SENTINEL LYMPH NODE BIOPSY;  Surgeon: Harriette Bouillon, MD;  Location: [facility];  Service: General;  Laterality: Right;   BREAST LUMPECTOMY WITH RADIOACTIVE SEED LOCALIZATION Right 06/14/2018   Procedure: RIGHT BREAST RADIOACTIVE SEED LOCALIZATION LUMPECTOMY X2;  Surgeon: Harriette Bouillon, MD;  Location: [facility];  Service: General;  Laterality: Right;   BREAST LUMPECTOMY WITH RADIOACTIVE SEED LOCALIZATION Left 11/03/2021   Procedure: LEFT BREAST BRACKETED LUMPECTOMY WITH RADIOACTIVE SEED LOCALIZATION;  Surgeon: Harriette Bouillon, MD;  Location: [facility];  Service: General;  Laterality: Left;   IR IMAGING GUIDED PORT INSERTION  06/17/2021   LAPAROSCOPIC ASSISTED VAGINAL HYSTERECTOMY N/A 02/27/2014   Procedure: LAPAROSCOPIC ASSISTED VAGINAL HYSTERECTOMY;   Surgeon: Dorien Chihuahua. Richardson Dopp, MD;  Location: [facility];  Service: Gynecology;  Laterality: N/A;  abdomen to vagina to abdomen   MASS EXCISION Right 02/16/2022   Procedure: EXCISION RIGHT BACK MASS/CYST;  Surgeon: Harriette Bouillon, MD;  Location: [facility];  Service: General;  Laterality: Right;  GEN AND LOCAL   PORT-A-CATH REMOVAL Left 06/08/2022   Procedure: REMOVAL PORT-A-CATH;  Surgeon: Harriette Bouillon, MD;  Location: [facility];  Service: General;  Laterality: Left;   TONSILLECTOMY     Patient Active Problem List   Diagnosis Date Noted   Vitamin D deficiency 07/09/2021   Iron deficiency anemia 07/09/2021   Hyperlipidemia 07/09/2021   Port-A-Cath in place 06/18/2021   Malignant neoplasm of upper-outer quadrant of right breast in female, estrogen receptor negative (HCC) 05/27/2021   Atypical ductal hyperplasia of right breast 07/18/2018   Genetic testing 03/31/2018   Family history of breast cancer in mother 04/03/2015   Family history of pancreatic cancer 04/03/2015   S/P hysterectomy 02/27/2014   ANEMIA 07/01/2009    REFERRING DIAG: right breast cancer at risk for lymphedema  THERAPY DIAG:  Aftercare following surgery for neoplasm  PERTINENT HISTORY: Breast cancer recurrence noted 05/19/21. Hx of Rt lumpectomy  in 2020 with Dr. Luisa Hart.  0/1 lymph node removed. Treatment was neoadjuvant chemotherapy TCHP followed by right and left lumpectomies and right SLNB  with 0+/4 LN on 10/31//2023 and radiation which ended  on 12/25/2021   PRECAUTIONS: right UE Lymphedema risk, None  SUBJECTIVE: Pt returns for her 3 month L-Dex screen.   PAIN:  Are you having pain? No  SOZO SCREENING: Patient was assessed today using the SOZO machine to determine the lymphedema index score. This was compared to her baseline score. It was determined that she is within the recommended range when compared to her baseline and no further action is needed at this time. She will continue SOZO  screenings. These are done every 3 months for 2 years post operatively followed by every 6 months for 2 years, and then annually.   L-DEX FLOWSHEETS - 06/28/22 1600       L-DEX LYMPHEDEMA SCREENING   Measurement Type Unilateral    L-DEX MEASUREMENT EXTREMITY Upper Extremity    POSITION  Standing    DOMINANT SIDE Left    At Risk Side Right    BASELINE SCORE (UNILATERAL) 1.6    L-DEX SCORE (UNILATERAL) -2.3    VALUE CHANGE (UNILAT) -3.9               Hermenia Bers, PTA 06/28/2022, 4:51 PM

## 2022-06-29 DIAGNOSIS — Z853 Personal history of malignant neoplasm of breast: Secondary | ICD-10-CM | POA: Diagnosis not present

## 2022-07-01 ENCOUNTER — Encounter: Payer: Self-pay | Admitting: Adult Health

## 2022-07-01 ENCOUNTER — Other Ambulatory Visit: Payer: Self-pay

## 2022-07-01 ENCOUNTER — Inpatient Hospital Stay: Payer: Federal, State, Local not specified - PPO | Attending: Hematology and Oncology | Admitting: Adult Health

## 2022-07-01 ENCOUNTER — Encounter: Payer: Self-pay | Admitting: Hematology and Oncology

## 2022-07-01 VITALS — BP 109/73 | HR 96 | Temp 99.2°F | Resp 16 | Wt 176.5 lb

## 2022-07-01 DIAGNOSIS — Z8249 Family history of ischemic heart disease and other diseases of the circulatory system: Secondary | ICD-10-CM | POA: Diagnosis not present

## 2022-07-01 DIAGNOSIS — Z171 Estrogen receptor negative status [ER-]: Secondary | ICD-10-CM | POA: Diagnosis not present

## 2022-07-01 DIAGNOSIS — Z8042 Family history of malignant neoplasm of prostate: Secondary | ICD-10-CM | POA: Diagnosis not present

## 2022-07-01 DIAGNOSIS — R200 Anesthesia of skin: Secondary | ICD-10-CM | POA: Diagnosis not present

## 2022-07-01 DIAGNOSIS — G629 Polyneuropathy, unspecified: Secondary | ICD-10-CM | POA: Diagnosis not present

## 2022-07-01 DIAGNOSIS — R5383 Other fatigue: Secondary | ICD-10-CM | POA: Insufficient documentation

## 2022-07-01 DIAGNOSIS — Z803 Family history of malignant neoplasm of breast: Secondary | ICD-10-CM | POA: Diagnosis not present

## 2022-07-01 DIAGNOSIS — R6882 Decreased libido: Secondary | ICD-10-CM | POA: Insufficient documentation

## 2022-07-01 DIAGNOSIS — Z79899 Other long term (current) drug therapy: Secondary | ICD-10-CM | POA: Insufficient documentation

## 2022-07-01 DIAGNOSIS — C50411 Malignant neoplasm of upper-outer quadrant of right female breast: Secondary | ICD-10-CM | POA: Insufficient documentation

## 2022-07-01 DIAGNOSIS — Z90710 Acquired absence of both cervix and uterus: Secondary | ICD-10-CM | POA: Insufficient documentation

## 2022-07-01 DIAGNOSIS — Z800 Family history of malignant neoplasm of digestive organs: Secondary | ICD-10-CM | POA: Diagnosis not present

## 2022-07-01 DIAGNOSIS — Z806 Family history of leukemia: Secondary | ICD-10-CM | POA: Diagnosis not present

## 2022-07-01 NOTE — Progress Notes (Signed)
SURVIVORSHIP VISIT:  BRIEF ONCOLOGIC HISTORY:  Oncology History  Malignant neoplasm of upper-outer quadrant of right breast in female, estrogen receptor negative (HCC)  06/14/2018 Surgery   06/14/2018: Right lumpectomy: Intraductal papilloma with usual ductal hyperplasia, 1 benign lymph node (decided against tamoxifen)   05/19/2021 Relapse/Recurrence   Screening mammogram detected right breast density.  2 lymph nodes were noted biopsy appointment was benign.  Ultrasound revealed 2.2 cm mass at 11 o'clock position right breast biopsy: Grade 3 IDC with necrosis, ER 0%, PR 0%, Ki-67 60%, HER2 3+ positive by [facility]   05/27/2021 Cancer Staging   Staging form: Breast, AJCC 8th Edition - Clinical: Stage IIA (cT2, cN0, cM0, G3, ER-, PR-, HER2+) - Signed by Serena Croissant, MD on 05/27/2021 Stage prefix: Initial diagnosis Histologic grading system: 3 grade system   06/18/2021 - 08/22/2021 Chemotherapy   Patient is on Treatment Plan : BREAST  Docetaxel + Carboplatin + Trastuzumab + Pertuzumab  (TCHP) q21d      06/18/2021 - 05/20/2022 Chemotherapy   Patient is on Treatment Plan : BREAST  Docetaxel + Carboplatin + Trastuzumab + Pertuzumab  (TCHP) q21d / Trastuzumab + Pertuzumab q21d     11/03/2021 Surgery   Right lumpectomy: Pathologic complete response, 0/4 lymph nodes, margins negative    12/07/2021 - 12/25/2021 Radiation Therapy   Plan Name: Breast_R Site: Breast, Right Technique: 3D Mode: Photon Dose Per Fraction: 2.67 Gy Prescribed Dose (Delivered / Prescribed): 40.05 Gy / 40.05 Gy Prescribed Fxs (Delivered / Prescribed): 15 / 15       INTERVAL HISTORY:  Ms. Krukowski to review her survivorship care plan detailing her treatment course for breast cancer, as well as monitoring long-term side effects of that treatment, education regarding health maintenance, screening, and overall wellness and health promotion.     Overall, Ms. Cassells reports feeling quite well.  She has completed her treatment and  because she was ER/PR negative she does not require any antiestrogen therapy.  Her biggest issue has been mild fatigue, decreased libido and vaginal dryness.  She did notice some burning in her labia but relates that to a different soap that she was using.  He also tells me that she has continued peripheral neuropathy in her fingertips and toes.  This is not causing any motor deficits however she does feel it.  She denies any pain or waking up in the middle of the night with the neuropathy.  REVIEW OF SYSTEMS:  Review of Systems  Constitutional:  Positive for fatigue. Negative for appetite change, chills, fever and unexpected weight change.  HENT:   Negative for hearing loss, lump/mass and trouble swallowing.   Eyes:  Negative for eye problems and icterus.  Respiratory:  Negative for chest tightness, cough and shortness of breath.   Cardiovascular:  Negative for chest pain, leg swelling and palpitations.  Gastrointestinal:  Negative for abdominal distention, abdominal pain, constipation, diarrhea, nausea and vomiting.  Endocrine: Negative for hot flashes.  Genitourinary:  Negative for difficulty urinating, dyspareunia, dysuria, vaginal bleeding and vaginal discharge.   Musculoskeletal:  Negative for arthralgias.  Skin:  Negative for itching and rash.  Neurological:  Positive for numbness. Negative for dizziness, extremity weakness and headaches.  Hematological:  Negative for adenopathy. Does not bruise/bleed easily.  Psychiatric/Behavioral:  Negative for depression. The patient is not nervous/anxious.    Breast: Denies any new nodularity, masses, tenderness, nipple changes, or nipple discharge.       PAST MEDICAL/SURGICAL HISTORY:  Past Medical History:  Diagnosis Date  Anemia    TAKES IRON PILLS   Anxiety    left hip   Arthritis    Cancer (HCC)    right breast DCIS   Depression    Family history of adverse reaction to anesthesia    sister- woke up during the procedure   Heart  murmur    as a child   PONV (postoperative nausea and vomiting)    Port-A-Cath in place 06/18/2021   Pre-diabetes    Past Surgical History:  Procedure Laterality Date   ABDOMINAL HYSTERECTOMY     BREAST LUMPECTOMY WITH RADIOACTIVE SEED AND SENTINEL LYMPH NODE BIOPSY Right 11/03/2021   Procedure: RIGHT BREAST BRACKETED LUMPECTOMY WITH RADIOACTIVE SEED AND SENTINEL LYMPH NODE BIOPSY;  Surgeon: Harriette Bouillon, MD;  Location: [facility];  Service: General;  Laterality: Right;   BREAST LUMPECTOMY WITH RADIOACTIVE SEED LOCALIZATION Right 06/14/2018   Procedure: RIGHT BREAST RADIOACTIVE SEED LOCALIZATION LUMPECTOMY X2;  Surgeon: Harriette Bouillon, MD;  Location: [facility];  Service: General;  Laterality: Right;   BREAST LUMPECTOMY WITH RADIOACTIVE SEED LOCALIZATION Left 11/03/2021   Procedure: LEFT BREAST BRACKETED LUMPECTOMY WITH RADIOACTIVE SEED LOCALIZATION;  Surgeon: Harriette Bouillon, MD;  Location: [facility];  Service: General;  Laterality: Left;   IR IMAGING GUIDED PORT INSERTION  06/17/2021   LAPAROSCOPIC ASSISTED VAGINAL HYSTERECTOMY N/A 02/27/2014   Procedure: LAPAROSCOPIC ASSISTED VAGINAL HYSTERECTOMY;  Surgeon: Dorien Chihuahua. Richardson Dopp, MD;  Location: [facility];  Service: Gynecology;  Laterality: N/A;  abdomen to vagina to abdomen   MASS EXCISION Right 02/16/2022   Procedure: EXCISION RIGHT BACK MASS/CYST;  Surgeon: Harriette Bouillon, MD;  Location: [facility];  Service: General;  Laterality: Right;  GEN AND LOCAL   PORT-A-CATH REMOVAL Left 06/08/2022   Procedure: REMOVAL PORT-A-CATH;  Surgeon: Harriette Bouillon, MD;  Location: [facility];  Service: General;  Laterality: Left;   TONSILLECTOMY       ALLERGIES:  No Known Allergies   CURRENT MEDICATIONS:  Outpatient Encounter Medications as of 07/01/2022  Medication Sig Note   cetirizine (ZYRTEC) 10 MG tablet Take 10 mg by mouth daily.    cholecalciferol (VITAMIN D3) 25 MCG (1000  UNIT) tablet Take 1 tablet (1,000 Units total) by mouth daily.    Cyanocobalamin (VITAMIN B-12) 3000 MCG SUBL Place 1 tablet under the tongue daily.    Elderberry-Vitamin C-Zinc (ELDERBERRY IMMUNE HEALTH GUMMY PO) Take 1 each by mouth daily. 06/03/2022: Currently on hold    ferrous sulfate 325 (65 FE) MG tablet Take 325 mg by mouth daily.    hyaluronate sodium (RADIAPLEXRX) GEL Apply 1 Application topically daily.    ibuprofen (ADVIL) 200 MG tablet Take 200 mg by mouth every 8 (eight) hours as needed for moderate pain.    oxyCODONE (OXY IR/ROXICODONE) 5 MG immediate release tablet Take 1 tablet (5 mg total) by mouth every 6 (six) hours as needed for severe pain.    [DISCONTINUED] prochlorperazine (COMPAZINE) 10 MG tablet Take 1 tablet (10 mg total) by mouth every 6 (six) hours as needed (Nausea or vomiting).    No facility-administered encounter medications on file as of 07/01/2022.     ONCOLOGIC FAMILY HISTORY:  Family History  Problem Relation Age of Onset   Breast cancer Mother        dx. late 83s; s/p mastectomy   Pancreatic cancer Mother 29       adenocarcinoma   Other Sister        one sister had a  hysterectomy due to heavy bleeding at the age of 85   Heart attack Maternal Uncle 41   Heart attack Maternal Grandmother 34   Prostate cancer Maternal Grandfather 9       "very aggressive"   Leukemia Other 64       maternal great aunt (MGM's sister)   Stomach cancer Other        maternal great aunt (MGF's sister) dx. over age 84   Heart Problems Paternal Grandmother    Cancer Other        maternal great uncle (MGM's brother) dx. NOS cancer at later age   Breast cancer Cousin        (2) maternal "2nd cousins" dx. with breast cancers in their mid-50s   Colon cancer Neg Hx      SOCIAL HISTORY:  Social History   Socioeconomic History   Marital status: Married    Spouse name: Not on file   Number of children: Not on file   Years of education: Not on file   Highest education  level: Not on file  Occupational History   Not on file  Tobacco Use   Smoking status: Never   Smokeless tobacco: Never  Vaping Use   Vaping Use: Never used  Substance and Sexual Activity   Alcohol use: Yes    Comment: socially   Drug use: No   Sexual activity: Yes    Birth control/protection: Surgical  Other Topics Concern   Not on file  Social History Narrative   Not on file   Social Determinants of Health   Financial Resource Strain: Not on file  Food Insecurity: Not on file  Transportation Needs: No Transportation Needs (06/20/2018)   PRAPARE - Administrator, Civil Service (Medical): No    Lack of Transportation (Non-Medical): No  Physical Activity: Not on file  Stress: Not on file  Social Connections: Not on file  Intimate Partner Violence: Not At Risk (06/20/2018)   Humiliation, Afraid, Rape, and Kick questionnaire    Fear of Current or Ex-Partner: No    Emotionally Abused: No    Physically Abused: No    Sexually Abused: No     OBSERVATIONS/OBJECTIVE:  BP 109/73 (BP Location: Left Arm, Patient Position: Sitting)   Pulse 96   Temp 99.2 F (37.3 C) (Temporal)   Resp 16   Wt 176 lb 8 oz (80.1 kg)   SpO2 100%   BMI 28.06 kg/m  GENERAL: Patient is a well appearing female in no acute distress HEENT:  Sclerae anicteric.  Oropharynx clear and moist. No ulcerations or evidence of oropharyngeal candidiasis. Neck is supple.  NODES:  No cervical, supraclavicular, or axillary lymphadenopathy palpated.  BREAST EXAM: Right breast status postlumpectomy and radiation no sign of local recurrence left breast is benign. LUNGS:  Clear to auscultation bilaterally.  No wheezes or rhonchi. HEART:  Regular rate and rhythm. No murmur appreciated. ABDOMEN:  Soft, nontender.  Positive, normoactive bowel sounds. No organomegaly palpated. MSK:  No focal spinal tenderness to palpation. Full range of motion bilaterally in the upper extremities. EXTREMITIES:  No peripheral  edema.   SKIN:  Clear with no obvious rashes or skin changes. No nail dyscrasia. NEURO:  Nonfocal. Well oriented.  Appropriate affect.   LABORATORY DATA:  None for this visit.  DIAGNOSTIC IMAGING:  None for this visit.      ASSESSMENT AND PLAN:  Ms.. Niebauer is a pleasant 57 y.o. female with Stage 2A recurrent right breast invasive  ductal carcinoma, ER-/PR-/HER2+, diagnosed in May 2023, treated with neoadjuvant chemotherapy, lumpectomy, and adjuvant radiation therapy.  She presents to the Survivorship Clinic for our initial meeting and routine follow-up post-completion of treatment for breast cancer.    1. Stage 2A recurrent right breast cancer:  Ms. Orta is continuing to recover from definitive treatment for breast cancer. She will follow-up with her medical oncologist, Dr. Pamelia Hoit in November 2024 as scheduled with history and physical exam per surveillance protocol.  Her mammogram is due June 29, 2023.   Today, a comprehensive survivorship care plan and treatment summary was reviewed with the patient today detailing her breast cancer diagnosis, treatment course, potential late/long-term effects of treatment, appropriate follow-up care with recommendations for the future, and patient education resources.  A copy of this summary, along with a letter will be sent to the patient's primary care provider via mail/fax/In Basket message after today's visit.    2. Bone health:   She was given education on specific activities to promote bone health.  3. Cancer screening:  Due to Ms. Delauter's history and her age, she should receive screening for skin cancers, colon cancers.  The information and recommendations are listed on the patient's comprehensive care plan/treatment summary and were reviewed in detail with the patient.    4. Health maintenance and wellness promotion: Ms. Ossman was encouraged to consume 5-7 servings of fruits and vegetables per day. We reviewed the "Nutrition Rainbow"  handout.  She was also encouraged to engage in moderate to vigorous exercise for 30 minutes per day most days of the week.  She was instructed to limit her alcohol consumption and continue to abstain from tobacco use.     5. Support services/counseling: It is not uncommon for this period of the patient's cancer care trajectory to be one of many emotions and stressors.   She was given information regarding our available services and encouraged to contact me with any questions or for help enrolling in any of our support group/programs.    Follow up instructions:    -Return to cancer center in 11/2022 for follow-up with Dr. Pamelia Hoit -Mammogram due in 06/2023 -She is welcome to return back to the Survivorship Clinic at any time; no additional follow-up needed at this time.  -Consider referral back to survivorship as a long-term survivor for continued surveillance  The patient was provided an opportunity to ask questions and all were answered. The patient agreed with the plan and demonstrated an understanding of the instructions.   Total encounter time:40 minutes*in face-to-face visit time, chart review, lab review, care coordination, order entry, and documentation of the encounter time.    Lillard Anes, NP 07/02/22 1:40 PM Medical Oncology and Hematology [facility] [address] [place], [place] [address] Tel. [phone]    Fax. [phone]  *Total Encounter Time as defined by the Centers for Medicare and Medicaid Services includes, in addition to the face-to-face time of a patient visit (documented in the note above) non-face-to-face time: obtaining and reviewing outside history, ordering and reviewing medications, tests or procedures, care coordination (communications with other health care professionals or caregivers) and documentation in the medical record.

## 2022-07-22 DIAGNOSIS — Z23 Encounter for immunization: Secondary | ICD-10-CM | POA: Diagnosis not present

## 2022-07-22 DIAGNOSIS — Z0000 Encounter for general adult medical examination without abnormal findings: Secondary | ICD-10-CM | POA: Diagnosis not present

## 2022-07-22 DIAGNOSIS — E559 Vitamin D deficiency, unspecified: Secondary | ICD-10-CM | POA: Diagnosis not present

## 2022-07-22 DIAGNOSIS — Z13220 Encounter for screening for lipoid disorders: Secondary | ICD-10-CM | POA: Diagnosis not present

## 2022-07-22 DIAGNOSIS — R5383 Other fatigue: Secondary | ICD-10-CM | POA: Diagnosis not present

## 2022-07-22 DIAGNOSIS — D509 Iron deficiency anemia, unspecified: Secondary | ICD-10-CM | POA: Diagnosis not present

## 2022-07-22 DIAGNOSIS — E785 Hyperlipidemia, unspecified: Secondary | ICD-10-CM | POA: Diagnosis not present

## 2022-08-06 DIAGNOSIS — R748 Abnormal levels of other serum enzymes: Secondary | ICD-10-CM | POA: Diagnosis not present

## 2022-09-23 DIAGNOSIS — Q438 Other specified congenital malformations of intestine: Secondary | ICD-10-CM | POA: Diagnosis not present

## 2022-09-23 DIAGNOSIS — K648 Other hemorrhoids: Secondary | ICD-10-CM | POA: Diagnosis not present

## 2022-09-23 DIAGNOSIS — K644 Residual hemorrhoidal skin tags: Secondary | ICD-10-CM | POA: Diagnosis not present

## 2022-09-23 DIAGNOSIS — Z1211 Encounter for screening for malignant neoplasm of colon: Secondary | ICD-10-CM | POA: Diagnosis not present

## 2022-09-27 ENCOUNTER — Ambulatory Visit: Payer: Federal, State, Local not specified - PPO | Attending: Hematology and Oncology

## 2022-09-27 VITALS — Wt 173.0 lb

## 2022-09-27 DIAGNOSIS — Z483 Aftercare following surgery for neoplasm: Secondary | ICD-10-CM | POA: Insufficient documentation

## 2022-09-27 NOTE — Therapy (Signed)
OUTPATIENT PHYSICAL THERAPY SOZO SCREENING NOTE   Patient Name: Patricia Houston MRN: [id number] DOB:Dec 06, 1965, 57 y.o., female Today's Date: 09/27/2022  PCP: Wilfrid Lund, [place] REFERRING PROVIDER: Serena Croissant, MD   PT End of Session - 09/27/22 1639     Visit Number 6   # unchanged due to screen only   PT Start Time 1637    PT Stop Time 1641    PT Time Calculation (min) 4 min    Activity Tolerance Patient tolerated treatment well    Behavior During Therapy WFL for tasks assessed/performed             Past Medical History:  Diagnosis Date   Anemia    TAKES IRON PILLS   Anxiety    left hip   Arthritis    Cancer (HCC)    right breast DCIS   Depression    Family history of adverse reaction to anesthesia    sister- woke up during the procedure   Heart murmur    as a child   PONV (postoperative nausea and vomiting)    Port-A-Cath in place 06/18/2021   Pre-diabetes    Past Surgical History:  Procedure Laterality Date   ABDOMINAL HYSTERECTOMY     BREAST LUMPECTOMY WITH RADIOACTIVE SEED AND SENTINEL LYMPH NODE BIOPSY Right 11/03/2021   Procedure: RIGHT BREAST BRACKETED LUMPECTOMY WITH RADIOACTIVE SEED AND SENTINEL LYMPH NODE BIOPSY;  Surgeon: Harriette Bouillon, MD;  Location: [facility];  Service: General;  Laterality: Right;   BREAST LUMPECTOMY WITH RADIOACTIVE SEED LOCALIZATION Right 06/14/2018   Procedure: RIGHT BREAST RADIOACTIVE SEED LOCALIZATION LUMPECTOMY X2;  Surgeon: Harriette Bouillon, MD;  Location: [facility];  Service: General;  Laterality: Right;   BREAST LUMPECTOMY WITH RADIOACTIVE SEED LOCALIZATION Left 11/03/2021   Procedure: LEFT BREAST BRACKETED LUMPECTOMY WITH RADIOACTIVE SEED LOCALIZATION;  Surgeon: Harriette Bouillon, MD;  Location: [facility];  Service: General;  Laterality: Left;   IR IMAGING GUIDED PORT INSERTION  06/17/2021   LAPAROSCOPIC ASSISTED VAGINAL HYSTERECTOMY N/A 02/27/2014   Procedure:  LAPAROSCOPIC ASSISTED VAGINAL HYSTERECTOMY;  Surgeon: Dorien Chihuahua. Richardson Dopp, MD;  Location: [facility];  Service: Gynecology;  Laterality: N/A;  abdomen to vagina to abdomen   MASS EXCISION Right 02/16/2022   Procedure: EXCISION RIGHT BACK MASS/CYST;  Surgeon: Harriette Bouillon, MD;  Location: [facility];  Service: General;  Laterality: Right;  GEN AND LOCAL   PORT-A-CATH REMOVAL Left 06/08/2022   Procedure: REMOVAL PORT-A-CATH;  Surgeon: Harriette Bouillon, MD;  Location: [facility];  Service: General;  Laterality: Left;   TONSILLECTOMY     Patient Active Problem List   Diagnosis Date Noted   Vitamin D deficiency 07/09/2021   Iron deficiency anemia 07/09/2021   Hyperlipidemia 07/09/2021   Malignant neoplasm of upper-outer quadrant of right breast in female, estrogen receptor negative (HCC) 05/27/2021   Atypical ductal hyperplasia of right breast 07/18/2018   Genetic testing 03/31/2018   Family history of breast cancer in mother 04/03/2015   Family history of pancreatic cancer 04/03/2015   S/P hysterectomy 02/27/2014   ANEMIA 07/01/2009    REFERRING DIAG: right breast cancer at risk for lymphedema  THERAPY DIAG: Aftercare following surgery for neoplasm  PERTINENT HISTORY: Breast cancer recurrence noted 05/19/21. Hx of Rt lumpectomy  in 2020 with Dr. Luisa Hart.  0/1 lymph node removed. Treatment was neoadjuvant chemotherapy TCHP followed by right and left lumpectomies and right SLNB  with 0+/4 LN on 10/31//2023 and radiation which ended on  12/25/2021   PRECAUTIONS: right UE Lymphedema risk, None  SUBJECTIVE: Pt returns for her 3 month L-Dex screen.   PAIN:  Are you having pain? No  SOZO SCREENING: Patient was assessed today using the SOZO machine to determine the lymphedema index score. This was compared to her baseline score. It was determined that she is within the recommended range when compared to her baseline and no further action is needed at this time. She will continue  SOZO screenings. These are done every 3 months for 2 years post operatively followed by every 6 months for 2 years, and then annually.   L-DEX FLOWSHEETS - 09/27/22 1600       L-DEX LYMPHEDEMA SCREENING   Measurement Type Unilateral    L-DEX MEASUREMENT EXTREMITY Upper Extremity    POSITION  Standing    DOMINANT SIDE Left    At Risk Side Right    BASELINE SCORE (UNILATERAL) 1.6    L-DEX SCORE (UNILATERAL) 4.2    VALUE CHANGE (UNILAT) 2.6               Hermenia Bers, PTA 09/27/2022, 4:41 PM

## 2022-11-05 DIAGNOSIS — R748 Abnormal levels of other serum enzymes: Secondary | ICD-10-CM | POA: Diagnosis not present

## 2022-11-22 NOTE — Assessment & Plan Note (Signed)
06/14/2018: Right lumpectomy: Intraductal papilloma with usual ductal hyperplasia, 1 benign lymph node (decided against tamoxifen) 05/19/2021: Screening mammogram detected right breast density.  2 lymph nodes were noted biopsy appointment was benign.  Ultrasound revealed 2.2 cm mass at 11 o'clock position right breast biopsy: Grade 3 IDC with necrosis, ER 0%, PR 0%, Ki-67 60%, HER2 3+ positive by IHC   Treatment Plan: 1. Neoadjuvant chemotherapy with TCH Perjeta 6 cycles followed by Herceptin Perjeta maintenance for 1 year completed 05/20/2022 2. 11/03/2021: Right lumpectomy: Pathologic complete response, 0/4 lymph nodes, margins negative 3. Followed by adjuvant radiation therapy 12/08/2021-12/25/2021 ------------------------------------------------------------------------------------------------------------------------------- Treatment plan: 02/17/2012 right back soft tissue excision: Inflammation with focal giant cell reaction consistent with ruptured epidermal cyst   Leukopenia: Monitoring  Breast cancer surveillance:  Mammogram 06/29/2022 at [facility]: Benign, density category C Breast exam 11/22/2022: Benign  Return to clinic in 1 year for follow-up

## 2022-11-23 ENCOUNTER — Inpatient Hospital Stay
Payer: Federal, State, Local not specified - PPO | Attending: Hematology and Oncology | Admitting: Hematology and Oncology

## 2022-11-23 VITALS — BP 110/71 | HR 96 | Temp 97.3°F | Resp 18 | Ht 66.5 in | Wt 174.6 lb

## 2022-11-23 DIAGNOSIS — Z1722 Progesterone receptor negative status: Secondary | ICD-10-CM | POA: Insufficient documentation

## 2022-11-23 DIAGNOSIS — R9230 Dense breasts, unspecified: Secondary | ICD-10-CM | POA: Diagnosis not present

## 2022-11-23 DIAGNOSIS — Z171 Estrogen receptor negative status [ER-]: Secondary | ICD-10-CM | POA: Insufficient documentation

## 2022-11-23 DIAGNOSIS — C50411 Malignant neoplasm of upper-outer quadrant of right female breast: Secondary | ICD-10-CM | POA: Diagnosis not present

## 2022-11-23 DIAGNOSIS — R5383 Other fatigue: Secondary | ICD-10-CM | POA: Diagnosis not present

## 2022-11-23 DIAGNOSIS — Z79899 Other long term (current) drug therapy: Secondary | ICD-10-CM | POA: Diagnosis not present

## 2022-11-23 NOTE — Progress Notes (Signed)
Patient Care Team: Wilfrid Lund, PA as PCP - General (Family Medicine) Harriette Bouillon, MD as Consulting Physician (General Surgery) Serena Croissant, MD as Consulting Physician (Hematology and Oncology) Lonie Peak, MD as Attending Physician (Radiation Oncology)  DIAGNOSIS:  Encounter Diagnosis  Name Primary?   Malignant neoplasm of upper-outer quadrant of right breast in female, estrogen receptor negative (HCC) Yes    SUMMARY OF ONCOLOGIC HISTORY: Oncology History  Malignant neoplasm of upper-outer quadrant of right breast in female, estrogen receptor negative (HCC)  06/14/2018 Surgery   06/14/2018: Right lumpectomy: Intraductal papilloma with usual ductal hyperplasia, 1 benign lymph node (decided against tamoxifen)   05/19/2021 Relapse/Recurrence   Screening mammogram detected right breast density.  2 lymph nodes were noted biopsy appointment was benign.  Ultrasound revealed 2.2 cm mass at 11 o'clock position right breast biopsy: Grade 3 IDC with necrosis, ER 0%, PR 0%, Ki-67 60%, HER2 3+ positive by [facility]   05/27/2021 Cancer Staging   Staging form: Breast, AJCC 8th Edition - Clinical: Stage IIA (cT2, cN0, cM0, G3, ER-, PR-, HER2+) - Signed by Serena Croissant, MD on 05/27/2021 Stage prefix: Initial diagnosis Histologic grading system: 3 grade system   06/18/2021 - 08/22/2021 Chemotherapy   Patient is on Treatment Plan : BREAST  Docetaxel + Carboplatin + Trastuzumab + Pertuzumab  (TCHP) q21d      06/18/2021 - 05/20/2022 Chemotherapy   Patient is on Treatment Plan : BREAST  Docetaxel + Carboplatin + Trastuzumab + Pertuzumab  (TCHP) q21d / Trastuzumab + Pertuzumab q21d     11/03/2021 Surgery   Right lumpectomy: Pathologic complete response, 0/4 lymph nodes, margins negative    12/07/2021 - 12/25/2021 Radiation Therapy   Plan Name: Breast_R Site: Breast, Right Technique: 3D Mode: Photon Dose Per Fraction: 2.67 Gy Prescribed Dose (Delivered / Prescribed): 40.05 Gy / 40.05  Gy Prescribed Fxs (Delivered / Prescribed): 15 / 15       CHIEF COMPLIANT: Surveillance of breast cancer  HISTORY OF PRESENT ILLNESS:   History of Present Illness   The patient, with a history of breast cancer 1.5 years ago, reports feeling well. She C/O stiffness and discomfort in right arm when not exercising. She has been following up with physical therapy and is trying to get back into a regular exercise routine. The patient also mentions feeling tired on some days, which she attributes to either not taking iron supplements or aging. She has had a recent primary care appointment where liver enzymes and a positive ANA test were noted, leading to a referral to rheumatology.      ALLERGIES:  has No Known Allergies.  MEDICATIONS:  Current Outpatient Medications  Medication Sig Dispense Refill   cetirizine (ZYRTEC) 10 MG tablet Take 10 mg by mouth daily.     cholecalciferol (VITAMIN D3) 25 MCG (1000 UNIT) tablet Take 1 tablet (1,000 Units total) by mouth daily.     Cyanocobalamin (VITAMIN B-12) 3000 MCG SUBL Place 1 tablet under the tongue daily.     Elderberry-Vitamin C-Zinc (ELDERBERRY IMMUNE HEALTH GUMMY PO) Take 1 each by mouth daily.     ferrous sulfate 325 (65 FE) MG tablet Take 325 mg by mouth daily.     hyaluronate sodium (RADIAPLEXRX) GEL Apply 1 Application topically daily.     ibuprofen (ADVIL) 200 MG tablet Take 200 mg by mouth every 8 (eight) hours as needed for moderate pain.     oxyCODONE (OXY IR/ROXICODONE) 5 MG immediate release tablet Take 1 tablet (5 mg total) by mouth  every 6 (six) hours as needed for severe pain. 15 tablet 0   No current facility-administered medications for this visit.    PHYSICAL EXAMINATION: ECOG PERFORMANCE STATUS: 1 - Symptomatic but completely ambulatory  Vitals:   11/23/22 0839  BP: 110/71  Pulse: 96  Resp: 18  Temp: (!) 97.3 F (36.3 C)  SpO2: 100%   Filed Weights   11/23/22 0839  Weight: 174 lb 9.6 oz (79.2 kg)       LABORATORY DATA:  I have reviewed the data as listed    Latest Ref Rng & Units 05/20/2022    9:18 AM 04/29/2022    8:53 AM 03/18/2022    8:35 AM  CMP  Glucose 70 - 99 mg/dL 97  962  952   BUN 6 - 20 mg/dL 16  14  22    Creatinine 0.44 - 1.00 mg/dL 8.41  3.24  4.01   Sodium 135 - 145 mmol/L 140  140  140   Potassium 3.5 - 5.1 mmol/L 4.1  4.1  4.2   Chloride 98 - 111 mmol/L 106  106  105   CO2 22 - 32 mmol/L 29  29  28    Calcium 8.9 - 10.3 mg/dL 9.6  02.7  25.3   Total Protein 6.5 - 8.1 g/dL 7.1  7.2  7.5   Total Bilirubin 0.3 - 1.2 mg/dL 0.4  0.4  0.3   Alkaline Phos 38 - 126 U/L 129  130  130   AST 15 - 41 U/L 18  15  14    ALT 0 - 44 U/L 17  18  13      Lab Results  Component Value Date   WBC 3.4 (L) 05/20/2022   HGB 10.6 (L) 05/20/2022   HCT 32.5 (L) 05/20/2022   MCV 88.3 05/20/2022   PLT 180 05/20/2022   NEUTROABS 1.7 05/20/2022    ASSESSMENT & PLAN:  Malignant neoplasm of upper-outer quadrant of right breast in female, estrogen receptor negative (HCC) 06/14/2018: Right lumpectomy: Intraductal papilloma with usual ductal hyperplasia, 1 benign lymph node (decided against tamoxifen) 05/19/2021: Screening mammogram detected right breast density.  2 lymph nodes were noted biopsy appointment was benign.  Ultrasound revealed 2.2 cm mass at 11 o'clock position right breast biopsy: Grade 3 IDC with necrosis, ER 0%, PR 0%, Ki-67 60%, HER2 3+ positive by IHC   Treatment Plan: 1. Neoadjuvant chemotherapy with TCH Perjeta 6 cycles followed by Herceptin Perjeta maintenance for 1 year completed 05/20/2022 2. 11/03/2021: Right lumpectomy: Pathologic complete response, 0/4 lymph nodes, margins negative 3. Followed by adjuvant radiation therapy 12/08/2021-12/25/2021 ------------------------------------------------------------------------------------------------------------------------------- Treatment plan: 02/17/2012 right back soft tissue excision: Inflammation with focal giant cell  reaction consistent with ruptured epidermal cyst   Breast cancer surveillance:  Mammogram 06/29/2022 at [facility]: Benign, density category C Breast exam 11/22/2022: Benign  Possible autoimmune condition   Recent blood tests showed positive ANA, indicating a possible autoimmune condition. Patient has been referred to rheumatology, with an appointment scheduled for March or May.   -Continue monitoring for any new symptoms and attend rheumatology appointment as scheduled.    General Health Maintenance   Patient has been maintaining weight and trying to improve diet.   -Encouraged to continue healthy eating habits and regular physical activity.   -Schedule follow-up appointment in one year.       Return to clinic in 1 year for follow-up    No orders of the defined types were placed in this encounter.  The patient has a  good understanding of the overall plan. she agrees with it. she will call with any problems that may develop before the next visit here. Total time spent: 30 mins including face to face time and time spent for planning, charting and co-ordination of care   Tamsen Meek, MD 11/23/22

## 2022-12-27 ENCOUNTER — Ambulatory Visit: Payer: Federal, State, Local not specified - PPO | Attending: Hematology and Oncology

## 2023-02-04 DIAGNOSIS — Z23 Encounter for immunization: Secondary | ICD-10-CM | POA: Diagnosis not present

## 2023-03-24 NOTE — Progress Notes (Signed)
 Office Visit Note  Patient: Patricia Houston             Date of Birth: 12-24-1965           MRN: [id number]             PCP: Wilfrid Lund, PA Referring: Wilfrid Lund, [place] Visit Date: 04/07/2023 Occupation: @GUAROCC @  Subjective:  Abnormal labs  History of Present Illness: Patricia Houston is a 58 y.o. female seen for the evaluation of abnormal labs and fatigue.  According the patient about 1-1/2 years ago after chemotherapy and radiation therapy she started having fatigue.  The fatigue gradually improved but did not completely resolved.  She was also experiencing joint stiffness which improved over time.  She states she is to have trapezius muscle spasms and Spasms.  Which also improved after taking magnesium.  She denies any muscle pain or joint pain now.  There is no history of joint swelling.  She gives history of dry mouth and dry eyes which she relates to lack of water intake.  There is no history of oral ulcers, nasal ulcers, Raynaud's, malar rash, photosensitivity or lymphadenopathy.  There is no family history of autoimmune disease except for her second cousin who has lupus.  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  There is no history of preeclampsia or DVTs.  She is left-handed.  She enjoys swimming, reading and working out on a regular basis.  She drinks alcohol socially and is non-smoker.    Activities of Daily Living:  Patient reports morning stiffness for a few minutes.   Patient Denies nocturnal pain.  Difficulty dressing/grooming: Denies Difficulty climbing stairs: Denies Difficulty getting out of chair: Denies Difficulty using hands for taps, buttons, cutlery, and/or writing: Denies  Review of Systems  Constitutional:  Positive for fatigue.  HENT:  Positive for mouth dryness. Negative for mouth sores.   Eyes:  Positive for dryness.  Respiratory:  Negative for shortness of breath.   Cardiovascular:  Negative for chest pain and  palpitations.  Gastrointestinal:  Negative for blood in stool, constipation and diarrhea.  Endocrine: Negative for increased urination.  Genitourinary:  Negative for involuntary urination.  Musculoskeletal:  Positive for morning stiffness. Negative for joint pain, gait problem, joint pain, joint swelling, myalgias, muscle weakness, muscle tenderness and myalgias.  Skin:  Negative for color change, rash and sensitivity to sunlight.  Allergic/Immunologic: Positive for susceptible to infections.  Neurological:  Negative for dizziness and headaches.  Hematological:  Negative for swollen glands.  Psychiatric/Behavioral:  Positive for sleep disturbance. Negative for depressed mood. The patient is not nervous/anxious.     PMFS History:  Patient Active Problem List   Diagnosis Date Noted   Vitamin D deficiency 07/09/2021   Iron deficiency anemia 07/09/2021   Hyperlipidemia 07/09/2021   Malignant neoplasm of upper-outer quadrant of right breast in female, estrogen receptor negative (HCC) 05/27/2021   Atypical ductal hyperplasia of right breast 07/18/2018   Genetic testing 03/31/2018   Family history of breast cancer in mother 04/03/2015   Family history of pancreatic cancer 04/03/2015   S/P hysterectomy 02/27/2014   ANEMIA 07/01/2009    Past Medical History:  Diagnosis Date   Anemia    TAKES IRON PILLS   Anxiety    left hip   Arthritis    Cancer (HCC)    right breast DCIS   Depression    Family history of adverse reaction to anesthesia  sister- woke up during the procedure   Heart murmur    as a child   PONV (postoperative nausea and vomiting)    Port-A-Cath in place 06/18/2021   Pre-diabetes     Family History  Problem Relation Age of Onset   Breast cancer Mother        dx. late 12s; s/p mastectomy   Pancreatic cancer Mother 75       adenocarcinoma   Heart disease Father    Hypertension Father    Hypertension Sister    Other Sister        one sister had a hysterectomy  due to heavy bleeding at the age of 61   High Cholesterol Sister    High Cholesterol Sister    High Cholesterol Sister    Hypertension Brother    Heart attack Maternal Uncle 41   Heart attack Maternal Grandmother 48   Prostate cancer Maternal Grandfather 31       "very aggressive"   Heart Problems Paternal Grandmother    Healthy Daughter    Breast cancer Cousin        (2) maternal "2nd cousins" dx. with breast cancers in their mid-50s   Lupus Cousin    Leukemia Other 32       maternal great aunt (MGM's sister)   Stomach cancer Other        maternal great aunt (MGF's sister) dx. over age 75   Cancer Other        maternal great uncle (MGM's brother) dx. NOS cancer at later age   Colon cancer Neg Hx    Past Surgical History:  Procedure Laterality Date   BREAST LUMPECTOMY WITH RADIOACTIVE SEED AND SENTINEL LYMPH NODE BIOPSY Right 11/03/2021   Procedure: RIGHT BREAST BRACKETED LUMPECTOMY WITH RADIOACTIVE SEED AND SENTINEL LYMPH NODE BIOPSY;  Surgeon: Harriette Bouillon, MD;  Location: [facility];  Service: General;  Laterality: Right;   BREAST LUMPECTOMY WITH RADIOACTIVE SEED LOCALIZATION Right 06/14/2018   Procedure: RIGHT BREAST RADIOACTIVE SEED LOCALIZATION LUMPECTOMY X2;  Surgeon: Harriette Bouillon, MD;  Location: [facility];  Service: General;  Laterality: Right;   BREAST LUMPECTOMY WITH RADIOACTIVE SEED LOCALIZATION Left 11/03/2021   Procedure: LEFT BREAST BRACKETED LUMPECTOMY WITH RADIOACTIVE SEED LOCALIZATION;  Surgeon: Harriette Bouillon, MD;  Location: [facility];  Service: General;  Laterality: Left;   IR IMAGING GUIDED PORT INSERTION  06/17/2021   LAPAROSCOPIC ASSISTED VAGINAL HYSTERECTOMY N/A 02/27/2014   Procedure: LAPAROSCOPIC ASSISTED VAGINAL HYSTERECTOMY;  Surgeon: Dorien Chihuahua. Richardson Dopp, MD;  Location: [facility];  Service: Gynecology;  Laterality: N/A;  abdomen to vagina to abdomen   MASS EXCISION Right 02/16/2022   Procedure: EXCISION RIGHT BACK  MASS/CYST;  Surgeon: Harriette Bouillon, MD;  Location: [facility];  Service: General;  Laterality: Right;  GEN AND LOCAL   PORT-A-CATH REMOVAL Left 06/08/2022   Procedure: REMOVAL PORT-A-CATH;  Surgeon: Harriette Bouillon, MD;  Location: [facility];  Service: General;  Laterality: Left;   TONSILLECTOMY     Social History   Social History Narrative   Not on file   Immunization History  Administered Date(s) Administered   Hepatitis A, Ped/Adol-2 Dose 10/26/2005, 04/25/2006   Hepatitis B, ADULT 10/26/2005, 12/18/2005, 04/25/2006   Influenza Split 10/23/2010   MMR 11/05/2005   Moderna Covid-19 Vaccine Bivalent Booster 38yrs & up 10/02/2020   Moderna Sars-Covid-2 Vaccination 01/02/2019, 02/03/2019, 12/26/2019   Td 11/19/2002, 03/24/2017   Tdap 01/19/2006     Objective: Vital  Signs: BP 114/77 (BP Location: Left Arm, Patient Position: Sitting, Cuff Size: Normal)   Pulse 75   Resp 14   Ht 5\' 6"  (1.676 m)   Wt 175 lb 9.6 oz (79.7 kg)   BMI 28.34 kg/m    Physical Exam Vitals and nursing note reviewed.  Constitutional:      Appearance: She is well-developed.  HENT:     Head: Normocephalic and atraumatic.  Eyes:     Conjunctiva/sclera: Conjunctivae normal.  Cardiovascular:     Rate and Rhythm: Normal rate and regular rhythm.     Heart sounds: Normal heart sounds.  Pulmonary:     Effort: Pulmonary effort is normal.     Breath sounds: Normal breath sounds.  Abdominal:     General: Bowel sounds are normal.     Palpations: Abdomen is soft.  Musculoskeletal:     Cervical back: Normal range of motion.  Lymphadenopathy:     Cervical: No cervical adenopathy.  Skin:    General: Skin is warm and dry.     Capillary Refill: Capillary refill takes less than 2 seconds.  Neurological:     Mental Status: She is alert and oriented to person, place, and time.  Psychiatric:        Behavior: Behavior normal.      Musculoskeletal Exam: Cervical, thoracic and  lumbar spine were in good range of motion.  Shoulder joints, elbow joints, wrist joints, MCPs PIPs and DIPs with good range of motion with no synovitis.  Hip joints, knee joints, ankles, MTPs and PIPs were in good range of motion with no synovitis.  CDAI Exam: CDAI Score: -- Patient Global: --; Provider Global: -- Swollen: --; Tender: -- Joint Exam 04/07/2023   No joint exam has been documented for this visit   There is currently no information documented on the homunculus. Go to the Rheumatology activity and complete the homunculus joint exam.  Investigation: No additional findings.  Imaging: No results found.  Recent Labs: Lab Results  Component Value Date   WBC 3.4 (L) 05/20/2022   HGB 10.6 (L) 05/20/2022   PLT 180 05/20/2022   NA 140 05/20/2022   K 4.1 05/20/2022   CL 106 05/20/2022   CO2 29 05/20/2022   GLUCOSE 97 05/20/2022   BUN 16 05/20/2022   CREATININE 1.11 (H) 05/20/2022   BILITOT 0.4 05/20/2022   ALKPHOS 129 (H) 05/20/2022   AST 18 05/20/2022   ALT 17 05/20/2022   PROT 7.1 05/20/2022   ALBUMIN 4.1 05/20/2022   CALCIUM 9.6 05/20/2022   GFRAA >60 06/12/2018   November 05, 2022 CBC WBC 5.3, hemoglobin 12.8, platelets 163, CMP calcium 10.6, alkaline phosphatase 153, GFR 63, AST 16, ALT 12, sed rate 32, CPK 80, ANA positive Speciality Comments: No specialty comments available.  Procedures:  No procedures performed Allergies: Patient has no known allergies.   Assessment / Plan:     Visit Diagnoses: Elevated alkaline phosphatase level -patient had elevated alkaline phosphatase since November 2024.  She denies any history of joint pain.  There was no history of trauma.  I will repeat labs and also alkaline phosphatase isoenzymes.  If her alkaline phosphatase remains elevated I will consider total body bone scan.  Plan: Serum protein electrophoresis with reflex, Alkaline Phosphatase Isoenzymes,   Vitamin D deficiency -she had vitamin D deficiency in the past.  She  has been taking vitamin D.  Will check vitamin D level.  Plan: VITAMIN D 25 Hydroxy (Vit-D Deficiency, Fractures)  Positive  ANA (antinuclear antibody) -she had positive ANA.  There is no history of oral ulcers, nasal ulcers, malar rash, photosensitivity, Raynaud's, lymphadenopathy.  There is no history of inflammatory arthritis.  I will check following labs today.  Plan: Protein / creatinine ratio, urine, ANA, Anti-scleroderma antibody, RNP Antibody, Anti-Smith antibody, Sjogrens syndrome-A extractable nuclear antibody, Sjogrens syndrome-B extractable nuclear antibody, Anti-DNA antibody, double-stranded, C3 and C4, Beta-2 glycoprotein antibodies, Cardiolipin antibodies, IgG, IgM, IgA  Other fatigue -she gives history of fatigue.  She has chronic anemia.  Plan: CBC with Differential/Platelet, Comprehensive metabolic panel with GFR  Malignant neoplasm of upper-outer quadrant of right breast in female, estrogen receptor negative (HCC) - 05/2021 lumpectomy, CTX, RTX.  Mixed hyperlipidemia  Family history of pancreatic cancer-mother  Family history of breast cancer in mother  Orders: Orders Placed This Encounter  Procedures   CBC with Differential/Platelet   Comprehensive metabolic panel with GFR   Protein / creatinine ratio, urine   VITAMIN D 25 Hydroxy (Vit-D Deficiency, Fractures)   ANA   Anti-scleroderma antibody   RNP Antibody   Anti-Smith antibody   Sjogrens syndrome-A extractable nuclear antibody   Sjogrens syndrome-B extractable nuclear antibody   Anti-DNA antibody, double-stranded   C3 and C4   Beta-2 glycoprotein antibodies   Cardiolipin antibodies, IgG, IgM, IgA   Serum protein electrophoresis with reflex   Alkaline Phosphatase Isoenzymes   No orders of the defined types were placed in this encounter.    Follow-Up Instructions: Return for Abnormal labs.   Pollyann Savoy, MD  Note - This record has been created using Animal nutritionist.  Chart creation errors have been  sought, but may not always  have been located. Such creation errors do not reflect on  the standard of medical care.

## 2023-04-07 ENCOUNTER — Encounter: Payer: Self-pay | Admitting: Rheumatology

## 2023-04-07 ENCOUNTER — Ambulatory Visit: Payer: Federal, State, Local not specified - PPO | Attending: Rheumatology | Admitting: Rheumatology

## 2023-04-07 VITALS — BP 114/77 | HR 75 | Resp 14 | Ht 66.0 in | Wt 175.6 lb

## 2023-04-07 DIAGNOSIS — R748 Abnormal levels of other serum enzymes: Secondary | ICD-10-CM

## 2023-04-07 DIAGNOSIS — E559 Vitamin D deficiency, unspecified: Secondary | ICD-10-CM

## 2023-04-07 DIAGNOSIS — R5383 Other fatigue: Secondary | ICD-10-CM

## 2023-04-07 DIAGNOSIS — R768 Other specified abnormal immunological findings in serum: Secondary | ICD-10-CM | POA: Diagnosis not present

## 2023-04-07 DIAGNOSIS — E782 Mixed hyperlipidemia: Secondary | ICD-10-CM

## 2023-04-07 DIAGNOSIS — Z800 Family history of malignant neoplasm of digestive organs: Secondary | ICD-10-CM

## 2023-04-07 DIAGNOSIS — C50411 Malignant neoplasm of upper-outer quadrant of right female breast: Secondary | ICD-10-CM

## 2023-04-07 DIAGNOSIS — Z803 Family history of malignant neoplasm of breast: Secondary | ICD-10-CM

## 2023-04-07 DIAGNOSIS — Z171 Estrogen receptor negative status [ER-]: Secondary | ICD-10-CM

## 2023-04-08 NOTE — Progress Notes (Signed)
 White cell count is low and stable.  CMP normal.  (Alkaline phosphatase was within normal limits) urine protein creatinine ratio normal, vitamin D normal, complements normal other autoimmune antibodies and alkaline phosphatase isoenzymes are pending.

## 2023-04-10 LAB — ALKALINE PHOSPHATASE ISOENZYMES
Alkaline phosphatase (APISO): 124 U/L (ref 37–153)
Bone Isoenzymes: 49 % (ref 28–66)
Intestinal Isoenzymes: 4 % (ref 1–24)
Liver Isoenzymes: 47 % (ref 25–69)
Macrohepatic isoenzymes: 0 % (ref <=0)
Placental isoenzymes: 0 % (ref <=0)

## 2023-04-10 LAB — CBC WITH DIFFERENTIAL/PLATELET
Absolute Lymphocytes: 1632 {cells}/uL (ref 850–3900)
Absolute Monocytes: 200 {cells}/uL (ref 200–950)
Basophils Absolute: 41 {cells}/uL (ref 0–200)
Basophils Relative: 1.1 %
Eosinophils Absolute: 170 {cells}/uL (ref 15–500)
Eosinophils Relative: 4.6 %
HCT: 38.6 % (ref 35.0–45.0)
Hemoglobin: 12.1 g/dL (ref 11.7–15.5)
MCH: 27.5 pg (ref 27.0–33.0)
MCHC: 31.3 g/dL — ABNORMAL LOW (ref 32.0–36.0)
MCV: 87.7 fL (ref 80.0–100.0)
MPV: 10.7 fL (ref 7.5–12.5)
Monocytes Relative: 5.4 %
Neutro Abs: 1658 {cells}/uL (ref 1500–7800)
Neutrophils Relative %: 44.8 %
Platelets: 189 10*3/uL (ref 140–400)
RBC: 4.4 10*6/uL (ref 3.80–5.10)
RDW: 13.3 % (ref 11.0–15.0)
Total Lymphocyte: 44.1 %
WBC: 3.7 10*3/uL — ABNORMAL LOW (ref 3.8–10.8)

## 2023-04-10 LAB — ANTI-NUCLEAR AB-TITER (ANA TITER)
ANA TITER: 1:160 {titer} — ABNORMAL HIGH
ANA Titer 1: 1:160 {titer} — ABNORMAL HIGH

## 2023-04-10 LAB — COMPREHENSIVE METABOLIC PANEL WITH GFR
AG Ratio: 1.8 (calc) (ref 1.0–2.5)
ALT: 13 U/L (ref 6–29)
AST: 13 U/L (ref 10–35)
Albumin: 4.5 g/dL (ref 3.6–5.1)
Alkaline phosphatase (APISO): 133 U/L (ref 37–153)
BUN: 10 mg/dL (ref 7–25)
CO2: 24 mmol/L (ref 20–32)
Calcium: 9.8 mg/dL (ref 8.6–10.4)
Chloride: 103 mmol/L (ref 98–110)
Creat: 0.88 mg/dL (ref 0.50–1.03)
Globulin: 2.5 g/dL (ref 1.9–3.7)
Glucose, Bld: 97 mg/dL (ref 65–99)
Potassium: 4.2 mmol/L (ref 3.5–5.3)
Sodium: 137 mmol/L (ref 135–146)
Total Bilirubin: 0.4 mg/dL (ref 0.2–1.2)
Total Protein: 7 g/dL (ref 6.1–8.1)
eGFR: 78 mL/min/{1.73_m2} (ref >=60)

## 2023-04-10 LAB — PROTEIN ELECTROPHORESIS, SERUM, WITH REFLEX
Albumin ELP: 4.2 g/dL (ref 3.8–4.8)
Alpha 1: 0.3 g/dL (ref 0.2–0.3)
Alpha 2: 0.6 g/dL (ref 0.5–0.9)
Beta 2: 0.4 g/dL (ref 0.2–0.5)
Beta Globulin: 0.5 g/dL (ref 0.4–0.6)
Gamma Globulin: 1.2 g/dL (ref 0.8–1.7)
Total Protein: 7.1 g/dL (ref 6.1–8.1)

## 2023-04-10 LAB — BETA-2 GLYCOPROTEIN ANTIBODIES
Beta-2 Glyco 1 IgA: <2 U/mL (ref <20.0)
Beta-2 Glyco 1 IgM: <2 U/mL (ref <20.0)
Beta-2 Glyco I IgG: <2 U/mL (ref <20.0)

## 2023-04-10 LAB — PROTEIN / CREATININE RATIO, URINE
Creatinine, Urine: 104 mg/dL (ref 20–275)
Protein/Creat Ratio: 48 mg/g{creat} (ref 24–184)
Protein/Creatinine Ratio: 0.048 mg/mg{creat} (ref 0.024–0.184)
Total Protein, Urine: 5 mg/dL (ref 5–24)

## 2023-04-10 LAB — CARDIOLIPIN ANTIBODIES, IGG, IGM, IGA
Anticardiolipin IgA: <2 [APL'U]/mL (ref <20.0)
Anticardiolipin IgG: <2 [GPL'U]/mL (ref <20.0)
Anticardiolipin IgM: <2 [MPL'U]/mL (ref <20.0)

## 2023-04-10 LAB — ANTI-DNA ANTIBODY, DOUBLE-STRANDED: ds DNA Ab: <1 [IU]/mL

## 2023-04-10 LAB — SJOGRENS SYNDROME-A EXTRACTABLE NUCLEAR ANTIBODY: SSA (Ro) (ENA) Antibody, IgG: <1 AI

## 2023-04-10 LAB — SJOGRENS SYNDROME-B EXTRACTABLE NUCLEAR ANTIBODY: SSB (La) (ENA) Antibody, IgG: <1 AI

## 2023-04-10 LAB — C3 AND C4
C3 Complement: 169 mg/dL (ref 83–193)
C4 Complement: 18 mg/dL (ref 15–57)

## 2023-04-10 LAB — ANA: Anti Nuclear Antibody (ANA): POSITIVE — AB

## 2023-04-10 LAB — ANTI-SCLERODERMA ANTIBODY: Scleroderma (Scl-70) (ENA) Antibody, IgG: <1 AI

## 2023-04-10 LAB — VITAMIN D 25 HYDROXY (VIT D DEFICIENCY, FRACTURES): Vit D, 25-Hydroxy: 37 ng/mL (ref 30–100)

## 2023-04-10 LAB — RNP ANTIBODY: Ribonucleic Protein(ENA) Antibody, IgG: <1 AI

## 2023-04-10 LAB — ANTI-SMITH ANTIBODY: ENA SM Ab Ser-aCnc: <1 AI

## 2023-04-10 NOTE — Progress Notes (Signed)
 ANA is positive.  All other autoimmune labs.  I will discuss results at the follow-up visit.

## 2023-04-26 NOTE — Progress Notes (Signed)
 Office Visit Note  Patient: Patricia Houston             Date of Birth: 27-Aug-1965           MRN: [id number]             PCP: Sinda Duel, PA Referring: Sinda Duel, [place] Visit Date: 05/05/2023 Occupation: @GUAROCC @  Subjective:  Positive ANA  History of Present Illness: Patricia Houston is a 58 y.o. female with positive ANA and fatigue.  She returns today after initial visit in April 2025.  She states she continues to have some neck stiffness.  However her symptoms improved after being on magnesium .  She gives history of ongoing fatigue.  She denies any history of oral ulcers, nasal ulcers, malar rash, photosensitivity, Raynaud's, lymphadenopathy or inflammatory arthritis.  She states none of the joints are painful or swollen.    Activities of Daily Living:  Patient reports morning stiffness for 0  none .   Patient Denies nocturnal pain.  Difficulty dressing/grooming: Denies Difficulty climbing stairs: Denies Difficulty getting out of chair: Denies Difficulty using hands for taps, buttons, cutlery, and/or writing: Denies  Review of Systems  Constitutional:  Positive for fatigue.  HENT:  Negative for mouth sores and mouth dryness.   Eyes:  Negative for dryness.  Respiratory:  Negative for shortness of breath.   Cardiovascular:  Negative for chest pain and palpitations.  Gastrointestinal:  Negative for blood in stool, constipation and diarrhea.  Endocrine: Negative for increased urination.  Genitourinary:  Negative for involuntary urination.  Musculoskeletal:  Negative for joint pain, gait problem, joint pain, joint swelling, myalgias, muscle weakness, morning stiffness, muscle tenderness and myalgias.  Skin:  Negative for color change, rash, hair loss and sensitivity to sunlight.  Allergic/Immunologic: Negative for susceptible to infections.  Neurological:  Positive for dizziness. Negative for headaches.  Hematological:  Negative for swollen glands.   Psychiatric/Behavioral:  Positive for sleep disturbance. Negative for depressed mood. The patient is not nervous/anxious.     PMFS History:  Patient Active Problem List   Diagnosis Date Noted   Vitamin D  deficiency 07/09/2021   Iron deficiency anemia 07/09/2021   Hyperlipidemia 07/09/2021   Malignant neoplasm of upper-outer quadrant of right breast in female, estrogen receptor negative (HCC) 05/27/2021   Atypical ductal hyperplasia of right breast 07/18/2018   Genetic testing 03/31/2018   Family history of breast cancer in mother 04/03/2015   Family history of pancreatic cancer 04/03/2015   S/P hysterectomy 02/27/2014   ANEMIA 07/01/2009    Past Medical History:  Diagnosis Date   Anemia    TAKES IRON PILLS   Anxiety    left hip   Arthritis    Cancer (HCC)    right breast DCIS   Depression    Family history of adverse reaction to anesthesia    sister- woke up during the procedure   Heart murmur    as a child   PONV (postoperative nausea and vomiting)    Port-A-Cath in place 06/18/2021   Pre-diabetes     Family History  Problem Relation Age of Onset   Breast cancer Mother        dx. late 77s; s/p mastectomy   Pancreatic cancer Mother 70       adenocarcinoma   Heart disease Father    Hypertension Father    Hypertension Sister    Other Sister        one sister had a hysterectomy due to heavy  bleeding at the age of 16   High Cholesterol Sister    High Cholesterol Sister    High Cholesterol Sister    Hypertension Brother    Heart attack Maternal Uncle 41   Heart attack Maternal Grandmother 64   Prostate cancer Maternal Grandfather 32       "very aggressive"   Heart Problems Paternal Grandmother    Healthy Daughter    Breast cancer Cousin        (2) maternal "2nd cousins" dx. with breast cancers in their mid-50s   Lupus Cousin    Leukemia Other 59       maternal great aunt (MGM's sister)   Stomach cancer Other        maternal great aunt (MGF's sister) dx. over  age 72   Cancer Other        maternal great uncle (MGM's brother) dx. NOS cancer at later age   Colon cancer Neg Hx    Past Surgical History:  Procedure Laterality Date   BREAST LUMPECTOMY WITH RADIOACTIVE SEED AND SENTINEL LYMPH NODE BIOPSY Right 11/03/2021   Procedure: RIGHT BREAST BRACKETED LUMPECTOMY WITH RADIOACTIVE SEED AND SENTINEL LYMPH NODE BIOPSY;  Surgeon: Sim Dryer, MD;  Location: [facility];  Service: General;  Laterality: Right;   BREAST LUMPECTOMY WITH RADIOACTIVE SEED LOCALIZATION Right 06/14/2018   Procedure: RIGHT BREAST RADIOACTIVE SEED LOCALIZATION LUMPECTOMY X2;  Surgeon: Sim Dryer, MD;  Location: [facility];  Service: General;  Laterality: Right;   BREAST LUMPECTOMY WITH RADIOACTIVE SEED LOCALIZATION Left 11/03/2021   Procedure: LEFT BREAST BRACKETED LUMPECTOMY WITH RADIOACTIVE SEED LOCALIZATION;  Surgeon: Sim Dryer, MD;  Location: [facility];  Service: General;  Laterality: Left;   IR IMAGING GUIDED PORT INSERTION  06/17/2021   LAPAROSCOPIC ASSISTED VAGINAL HYSTERECTOMY N/A 02/27/2014   Procedure: LAPAROSCOPIC ASSISTED VAGINAL HYSTERECTOMY;  Surgeon: Lizette Righter. Wynona Hedger, MD;  Location: [facility];  Service: Gynecology;  Laterality: N/A;  abdomen to vagina to abdomen   MASS EXCISION Right 02/16/2022   Procedure: EXCISION RIGHT BACK MASS/CYST;  Surgeon: Sim Dryer, MD;  Location: [facility];  Service: General;  Laterality: Right;  GEN AND LOCAL   PORT-A-CATH REMOVAL Left 06/08/2022   Procedure: REMOVAL PORT-A-CATH;  Surgeon: Sim Dryer, MD;  [facility];  Service: General;  Laterality: Left;   TONSILLECTOMY     Social History   Social History Narrative   Not on file   Immunization History  Administered Date(s) Administered   Hepatitis A, Ped/Adol-2 Dose 10/26/2005, 04/25/2006   Hepatitis B, ADULT 10/26/2005, 12/18/2005, 04/25/2006   Influenza Split 10/23/2010   MMR  11/05/2005   Moderna Covid-19 Vaccine Bivalent Booster 23yrs & up 10/02/2020   Moderna Sars-Covid-2 Vaccination 01/02/2019, 02/03/2019, 12/26/2019   Td 11/19/2002, 03/24/2017   Tdap 01/19/2006     Objective: Vital Signs: BP 107/75 (BP Location: Left Arm, Patient Position: Sitting, Cuff Size: Normal)   Pulse 74   Resp 14   Ht 5\' 6"  (1.676 m)   Wt 177 lb (80.3 kg)   BMI 28.57 kg/m    Physical Exam Vitals and nursing note reviewed.  Constitutional:      Appearance: She is well-developed.  HENT:     Head: Normocephalic and atraumatic.  Eyes:     Conjunctiva/sclera: Conjunctivae normal.  Cardiovascular:     Rate and Rhythm: Normal rate and regular rhythm.     Heart sounds: Normal heart sounds.  Pulmonary:     Effort:  Pulmonary effort is normal.     Breath sounds: Normal breath sounds.  Abdominal:     General: Bowel sounds are normal.     Palpations: Abdomen is soft.  Musculoskeletal:     Cervical back: Normal range of motion.  Lymphadenopathy:     Cervical: No cervical adenopathy.  Skin:    General: Skin is warm and dry.     Capillary Refill: Capillary refill takes less than 2 seconds.  Neurological:     Mental Status: She is alert and oriented to person, place, and time.  Psychiatric:        Behavior: Behavior normal.      Musculoskeletal Exam: Cervical, thoracic and lumbar spine with good range of motion.  Shoulders, elbow joints, wrist joints, MCPs PIPs and DIPs with good range of motion with no synovitis.  Hip joints, knee joints, ankles, MTPs and PIPs with good range of motion with no synovitis.  CDAI Exam: CDAI Score: -- Patient Global: --; Provider Global: -- Swollen: --; Tender: -- Joint Exam 05/05/2023   No joint exam has been documented for this visit   There is currently no information documented on the homunculus. Go to the Rheumatology activity and complete the homunculus joint exam.  Investigation: No additional findings.  Imaging: No results  found.  Recent Labs: Lab Results  Component Value Date   WBC 3.7 (L) 04/07/2023   HGB 12.1 04/07/2023   PLT 189 04/07/2023   NA 137 04/07/2023   K 4.2 04/07/2023   CL 103 04/07/2023   CO2 24 04/07/2023   GLUCOSE 97 04/07/2023   BUN 10 04/07/2023   CREATININE 0.88 04/07/2023   BILITOT 0.4 04/07/2023   ALKPHOS 129 (H) 05/20/2022   AST 13 04/07/2023   ALT 13 04/07/2023   PROT 7.0 04/07/2023   PROT 7.1 04/07/2023   ALBUMIN 4.1 05/20/2022   CALCIUM 9.8 04/07/2023   GFRAA >60 06/12/2018   April 07, 2023 SPEP normal, alkaline phosphatase 124, isoenzymes normal, urine protein creatinine ratio normal, ANA 1: 160 NS, 1: 160 nuclear dots, ENA (SCL 70, RNP, Smith, SSA, SSB, dsDNA) negative, C3-C4 normal, anticardiolipin negative, beta-2  GP 1 negative, vitamin D  37  Speciality Comments: No specialty comments available.  Procedures:  No procedures performed Allergies: Patient has no known allergies.   Assessment / Plan:     Visit Diagnoses: Positive ANA (antinuclear antibody) -patient gives history of fatigue.  There is no history of oral ulcers, nasal ulcers, malar rash, photosensitivity, Raynaud's or lymphadenopathy.  There is no history of inflammatory arthritis.  Urine protein creatinine ratio normal, ANA 1: 160 NS, 1: 160 nuclear dots, ENA (SCL 70, RNP, Smith, SSA, SSB, dsDNA) negative, C3-C4 normal, anticardiolipin negative, beta-2  GP 1 negative.  Labs showed low titer positive ANA.  ENA panel was completely negative.  Lab results were discussed with the patient at length.  I advised her to contact us  if she develops any new symptoms.  Elevated alkaline phosphatase level - Repeat alkaline phosphatase normal 124.  Alkaline phosphatase fractionation was normal.  Vitamin D  deficiency - Repeat vitamin D  level normal at 37.  Labs were discussed with the patient.  Malignant neoplasm of upper-outer quadrant of right breast in female, estrogen receptor negative [facility]) - May 2023 lumpectomy,  chemotherapy and radiation therapy.  Mixed hyperlipidemia  Other fatigue - Probably related to chronic anemia.  Family history of pancreatic cancer-mother  Family history of breast cancer in mother  Orders: No orders of the defined types were placed  in this encounter.  No orders of the defined types were placed in this encounter.    Follow-Up Instructions: Return if symptoms worsen or fail to improve, for +ANA.   Nicholas Bari, MD  Note - This record has been created using Animal nutritionist.  Chart creation errors have been sought, but may not always  have been located. Such creation errors do not reflect on  the standard of medical care.

## 2023-05-05 ENCOUNTER — Encounter: Payer: Self-pay | Admitting: Rheumatology

## 2023-05-05 ENCOUNTER — Ambulatory Visit: Payer: Federal, State, Local not specified - PPO | Attending: Rheumatology | Admitting: Rheumatology

## 2023-05-05 VITALS — BP 107/75 | HR 74 | Resp 14 | Ht 66.0 in | Wt 177.0 lb

## 2023-05-05 DIAGNOSIS — R768 Other specified abnormal immunological findings in serum: Secondary | ICD-10-CM | POA: Diagnosis not present

## 2023-05-05 DIAGNOSIS — Z800 Family history of malignant neoplasm of digestive organs: Secondary | ICD-10-CM

## 2023-05-05 DIAGNOSIS — Z171 Estrogen receptor negative status [ER-]: Secondary | ICD-10-CM

## 2023-05-05 DIAGNOSIS — R5383 Other fatigue: Secondary | ICD-10-CM

## 2023-05-05 DIAGNOSIS — E782 Mixed hyperlipidemia: Secondary | ICD-10-CM

## 2023-05-05 DIAGNOSIS — Z803 Family history of malignant neoplasm of breast: Secondary | ICD-10-CM

## 2023-05-05 DIAGNOSIS — R748 Abnormal levels of other serum enzymes: Secondary | ICD-10-CM | POA: Diagnosis not present

## 2023-05-05 DIAGNOSIS — C50411 Malignant neoplasm of upper-outer quadrant of right female breast: Secondary | ICD-10-CM | POA: Diagnosis not present

## 2023-05-05 DIAGNOSIS — E559 Vitamin D deficiency, unspecified: Secondary | ICD-10-CM

## 2023-06-16 DIAGNOSIS — L02212 Cutaneous abscess of back [any part, except buttock]: Secondary | ICD-10-CM | POA: Diagnosis not present

## 2023-06-30 DIAGNOSIS — Z08 Encounter for follow-up examination after completed treatment for malignant neoplasm: Secondary | ICD-10-CM | POA: Diagnosis not present

## 2023-06-30 DIAGNOSIS — Z853 Personal history of malignant neoplasm of breast: Secondary | ICD-10-CM | POA: Diagnosis not present

## 2023-06-30 DIAGNOSIS — R92333 Mammographic heterogeneous density, bilateral breasts: Secondary | ICD-10-CM | POA: Diagnosis not present

## 2023-09-02 DIAGNOSIS — Z1331 Encounter for screening for depression: Secondary | ICD-10-CM | POA: Diagnosis not present

## 2023-09-02 DIAGNOSIS — R5383 Other fatigue: Secondary | ICD-10-CM | POA: Diagnosis not present

## 2023-09-02 DIAGNOSIS — Z0000 Encounter for general adult medical examination without abnormal findings: Secondary | ICD-10-CM | POA: Diagnosis not present

## 2023-09-02 DIAGNOSIS — D509 Iron deficiency anemia, unspecified: Secondary | ICD-10-CM | POA: Diagnosis not present

## 2023-09-02 DIAGNOSIS — E785 Hyperlipidemia, unspecified: Secondary | ICD-10-CM | POA: Diagnosis not present

## 2023-09-02 DIAGNOSIS — R7303 Prediabetes: Secondary | ICD-10-CM | POA: Diagnosis not present

## 2023-11-23 ENCOUNTER — Inpatient Hospital Stay
Payer: Federal, State, Local not specified - PPO | Attending: Hematology and Oncology | Admitting: Hematology and Oncology

## 2023-11-23 VITALS — BP 110/73 | HR 84 | Temp 98.5°F | Resp 18 | Wt 177.1 lb

## 2023-11-23 DIAGNOSIS — Z1722 Progesterone receptor negative status: Secondary | ICD-10-CM | POA: Insufficient documentation

## 2023-11-23 DIAGNOSIS — R9230 Dense breasts, unspecified: Secondary | ICD-10-CM | POA: Diagnosis not present

## 2023-11-23 DIAGNOSIS — Z171 Estrogen receptor negative status [ER-]: Secondary | ICD-10-CM | POA: Insufficient documentation

## 2023-11-23 DIAGNOSIS — Z79899 Other long term (current) drug therapy: Secondary | ICD-10-CM | POA: Insufficient documentation

## 2023-11-23 DIAGNOSIS — Z1731 Human epidermal growth factor receptor 2 positive status: Secondary | ICD-10-CM | POA: Diagnosis not present

## 2023-11-23 DIAGNOSIS — N951 Menopausal and female climacteric states: Secondary | ICD-10-CM | POA: Insufficient documentation

## 2023-11-23 DIAGNOSIS — C50411 Malignant neoplasm of upper-outer quadrant of right female breast: Secondary | ICD-10-CM | POA: Diagnosis present

## 2023-11-23 DIAGNOSIS — G4700 Insomnia, unspecified: Secondary | ICD-10-CM | POA: Diagnosis not present

## 2023-11-23 NOTE — Progress Notes (Signed)
 Patient Care Team: Alben Therisa MATSU, PA as PCP - General (Family Medicine) Vanderbilt Ned, MD as Consulting Physician (General Surgery) Odean Potts, MD as Consulting Physician (Hematology and Oncology) Izell Domino, MD as Attending Physician (Radiation Oncology)  DIAGNOSIS:  Encounter Diagnosis  Name Primary?   Malignant neoplasm of upper-outer quadrant of right breast in female, estrogen receptor negative (HCC) Yes    SUMMARY OF ONCOLOGIC HISTORY: Oncology History  Malignant neoplasm of upper-outer quadrant of right breast in female, estrogen receptor negative (HCC)  06/14/2018 Surgery   06/14/2018: Right lumpectomy: Intraductal papilloma with usual ductal hyperplasia, 1 benign lymph node (decided against tamoxifen)   05/19/2021 Relapse/Recurrence   Screening mammogram detected right breast density.  2 lymph nodes were noted biopsy appointment was benign.  Ultrasound revealed 2.2 cm mass at 11 o'clock position right breast biopsy: Grade 3 IDC with necrosis, ER 0%, PR 0%, Ki-67 60%, HER2 3+ positive by IHC   05/27/2021 Cancer Staging   Staging form: Breast, AJCC 8th Edition - Clinical: Stage IIA (cT2, cN0, cM0, G3, ER-, PR-, HER2+) - Signed by Odean Potts, MD on 05/27/2021 Stage prefix: Initial diagnosis Histologic grading system: 3 grade system   06/18/2021 - 08/22/2021 Chemotherapy   Patient is on Treatment Plan : BREAST  Docetaxel  + Carboplatin  + Trastuzumab  + Pertuzumab   (TCHP) q21d      06/18/2021 - 05/20/2022 Chemotherapy   Patient is on Treatment Plan : BREAST  Docetaxel  + Carboplatin  + Trastuzumab  + Pertuzumab   (TCHP) q21d / Trastuzumab  + Pertuzumab  q21d     11/03/2021 Surgery   Right lumpectomy: Pathologic complete response, 0/4 lymph nodes, margins negative    12/07/2021 - 12/25/2021 Radiation Therapy   Plan Name: Breast_R Site: Breast, Right Technique: 3D Mode: Photon Dose Per Fraction: 2.67 Gy Prescribed Dose (Delivered / Prescribed): 40.05 Gy / 40.05  Gy Prescribed Fxs (Delivered / Prescribed): 15 / 15       CHIEF COMPLIANT: Surveillance of breast cancer  HISTORY OF PRESENT ILLNESS:   History of Present Illness Patricia Houston is a 58 year old female with a history of breast cancer who presents for routine follow-up.  She experiences occasional sharp jabs of pain, which are common post-treatment. A mamm[facility] completed a few months ago at [facility] showed no abnormalities. Her memory has improved significantly since treatment, with occasional difficulty finding words. Neuropathy symptoms have resolved.     ALLERGIES:  has no known allergies.  MEDICATIONS:  Current Outpatient Medications  Medication Sig Dispense Refill   cetirizine (ZYRTEC) 10 MG tablet Take 10 mg by mouth daily.     Cyanocobalamin  (VITAMIN B-12) 3000 MCG SUBL Place 1 tablet under the tongue daily.     ferrous sulfate 325 (65 FE) MG tablet Take 325 mg by mouth daily.     hyaluronate sodium (RADIAPLEXRX) GEL Apply 1 Application topically as needed.     ibuprofen  (ADVIL ) 200 MG tablet Take 200 mg by mouth every 8 (eight) hours as needed for moderate pain.     Multiple Vitamin (MULTIVITAMIN PO) Take by mouth daily.     cholecalciferol (VITAMIN D3) 25 MCG (1000 UNIT) tablet Take 1 tablet (1,000 Units total) by mouth daily. (Patient not taking: Reported on 11/23/2023)     Elderberry-Vitamin C-Zinc (ELDERBERRY IMMUNE HEALTH GUMMY PO) Take 1 each by mouth daily. (Patient not taking: Reported on 11/23/2023)     No current facility-administered medications for this visit.    PHYSICAL EXAMINATION: ECOG PERFORMANCE STATUS: 1 - Symptomatic but completely ambulatory  Vitals:   11/23/23 0935  BP: 110/73  Pulse: 84  Resp: 18  Temp: 98.5 F (36.9 C)  SpO2: 100%   Filed Weights   11/23/23 0935  Weight: 177 lb 1.6 oz (80.3 kg)      LABORATORY DATA:  I have reviewed the data as listed    Latest Ref Rng & Units 04/07/2023    9:46 AM 05/20/2022    9:18 AM 04/29/2022     8:53 AM  CMP  Glucose 65 - 99 mg/dL 97  97  893   BUN 7 - 25 mg/dL 10  16  14    Creatinine 0.50 - 1.03 mg/dL 9.11  8.88  8.87   Sodium 135 - 146 mmol/L 137  140  140   Potassium 3.5 - 5.3 mmol/L 4.2  4.1  4.1   Chloride 98 - 110 mmol/L 103  106  106   CO2 20 - 32 mmol/L 24  29  29    Calcium 8.6 - 10.4 mg/dL 9.8  9.6  89.8   Total Protein 6.1 - 8.1 g/dL 6.1 - 8.1 g/dL 7.0    7.1  7.1  7.2   Total Bilirubin 0.2 - 1.2 mg/dL 0.4  0.4  0.4   Alkaline Phos 38 - 126 U/L  129  130   AST 10 - 35 U/L 13  18  15    ALT 6 - 29 U/L 13  17  18      Lab Results  Component Value Date   WBC 3.7 (L) 04/07/2023   HGB 12.1 04/07/2023   HCT 38.6 04/07/2023   MCV 87.7 04/07/2023   PLT 189 04/07/2023   NEUTROABS 1,658 04/07/2023    ASSESSMENT & PLAN:  Malignant neoplasm of upper-outer quadrant of right breast in female, estrogen receptor negative (HCC) 06/14/2018: Right lumpectomy: Intraductal papilloma with usual ductal hyperplasia, 1 benign lymph node (decided against tamoxifen) 05/19/2021: Screening mammogram detected right breast density.  2 lymph nodes were noted biopsy: Benign.  Ultrasound revealed 2.2 cm mass at 11 o'clock position right breast biopsy: Grade 3 IDC with necrosis, ER 0%, PR 0%, Ki-67 60%, HER2 3+ positive by IHC   Treatment Plan: 1. Neoadjuvant chemotherapy with [facility] Perjeta  6 cycles followed by Herceptin  Perjeta  maintenance for 1 year completed 05/20/2022 2. 11/03/2021: Right lumpectomy: Pathologic complete response, 0/4 lymph nodes, margins negative 3. Followed by adjuvant radiation therapy 12/08/2021-12/25/2021 ------------------------------------------------------------------------------------------------------------------------------- Treatment plan: 02/17/2012 right back soft tissue excision: Inflammation with focal giant cell reaction consistent with ruptured epidermal cyst   Breast cancer surveillance:  Mammogram June 2025 at [facility]: Benign, density category C  Breast  exam 11/23/2023: Benign   I strongly encouraged her to exercise regularly. Return to clinic in 1 year for follow-up ------------------------------------- Assessment and Plan Assessment & Plan Malignant neoplasm of upper-outer quadrant of right breast, surveillance and survivorship Surveillance ongoing. Recent mammogram normal. No significant residual side effects. Occasional pain common. Memory improved. Neuropathy resolved. No severe joint pains, only stiffness likely menopausal. - Continue regular mammogram screenings. - Encouraged regular exercise to reduce breast cancer risk and improve health.  Menopausal symptoms Stiffness and insomnia noted, likely menopausal. - Encouraged daily stretching, yoga, and aerobic exercise to alleviate stiffness and improve sleep quality.      No orders of the defined types were placed in this encounter.  The patient has a good understanding of the overall plan. she agrees with it. she will call with any problems that may develop before the next visit here.  I  personally spent a total of 30 minutes in the care of the patient today including preparing to see the patient, getting/reviewing separately obtained history, performing a medically appropriate exam/evaluation, counseling and educating, placing orders, referring and communicating with other health care professionals, documenting clinical information in the EHR, independently interpreting results, communicating results, and coordinating care.   Viinay K Saman Umstead, MD 11/23/23

## 2023-11-23 NOTE — Assessment & Plan Note (Signed)
 06/14/2018: Right lumpectomy: Intraductal papilloma with usual ductal hyperplasia, 1 benign lymph node (decided against tamoxifen) 05/19/2021: Screening mammogram detected right breast density.  2 lymph nodes were noted biopsy appointment was benign.  Ultrasound revealed 2.2 cm mass at 11 o'clock position right breast biopsy: Grade 3 IDC with necrosis, ER 0%, PR 0%, Ki-67 60%, HER2 3+ positive by IHC   Treatment Plan: 1. Neoadjuvant chemotherapy with [facility] Perjeta  6 cycles followed by Herceptin  Perjeta  maintenance for 1 year completed 05/20/2022 2. 11/03/2021: Right lumpectomy: Pathologic complete response, 0/4 lymph nodes, margins negative 3. Followed by adjuvant radiation therapy 12/08/2021-12/25/2021 ------------------------------------------------------------------------------------------------------------------------------- Treatment plan: 02/17/2012 right back soft tissue excision: Inflammation with focal giant cell reaction consistent with ruptured epidermal cyst   Breast cancer surveillance:  Mammogram 06/29/2022 at [facility]: Benign, density category C, she will need a new mammogram  Breast exam 11/23/2023: Benign   Possible autoimmune condition   Recent blood tests showed positive ANA, indicating a possible autoimmune condition. Patient has been referred to rheumatology,  Return to clinic in 1 year for follow-up

## 2024-11-22 ENCOUNTER — Inpatient Hospital Stay: Attending: Hematology and Oncology | Admitting: Hematology and Oncology
